# Patient Record
Sex: Male | Born: 1950
Health system: Southern US, Community
[De-identification: ages and names within clinical notes are randomized; demographics above are authoritative.]

## PROBLEM LIST (undated history)

## (undated) DIAGNOSIS — K5790 Diverticulosis of intestine, part unspecified, without perforation or abscess without bleeding: Secondary | ICD-10-CM

## (undated) DIAGNOSIS — R7303 Prediabetes: Secondary | ICD-10-CM

## (undated) DIAGNOSIS — Z973 Presence of spectacles and contact lenses: Secondary | ICD-10-CM

## (undated) DIAGNOSIS — E785 Hyperlipidemia, unspecified: Secondary | ICD-10-CM

## (undated) DIAGNOSIS — K635 Polyp of colon: Secondary | ICD-10-CM

## (undated) DIAGNOSIS — N189 Chronic kidney disease, unspecified: Secondary | ICD-10-CM

## (undated) DIAGNOSIS — K219 Gastro-esophageal reflux disease without esophagitis: Secondary | ICD-10-CM

## (undated) DIAGNOSIS — J189 Pneumonia, unspecified organism: Secondary | ICD-10-CM

## (undated) DIAGNOSIS — I2510 Atherosclerotic heart disease of native coronary artery without angina pectoris: Secondary | ICD-10-CM

## (undated) DIAGNOSIS — C4491 Basal cell carcinoma of skin, unspecified: Secondary | ICD-10-CM

## (undated) DIAGNOSIS — M1990 Unspecified osteoarthritis, unspecified site: Secondary | ICD-10-CM

## (undated) DIAGNOSIS — R0600 Dyspnea, unspecified: Secondary | ICD-10-CM

## (undated) DIAGNOSIS — U071 COVID-19: Secondary | ICD-10-CM

## (undated) DIAGNOSIS — R519 Headache, unspecified: Secondary | ICD-10-CM

## (undated) DIAGNOSIS — G4730 Sleep apnea, unspecified: Secondary | ICD-10-CM

## (undated) DIAGNOSIS — Z87442 Personal history of urinary calculi: Secondary | ICD-10-CM

## (undated) HISTORY — PX: TONSILLECTOMY: SUR1361

## (undated) HISTORY — DX: COVID-19: U07.1

## (undated) HISTORY — DX: Unspecified osteoarthritis, unspecified site: M19.90

## (undated) HISTORY — DX: Polyp of colon: K63.5

## (undated) HISTORY — DX: Basal cell carcinoma of skin, unspecified: C44.91

## (undated) HISTORY — PX: CARDIAC CATHETERIZATION: SHX172

## (undated) HISTORY — DX: Diverticulosis of intestine, part unspecified, without perforation or abscess without bleeding: K57.90

## (undated) HISTORY — PX: COLONOSCOPY: SHX174

## (undated) HISTORY — PX: APPENDECTOMY: SHX54

## (undated) HISTORY — PX: BASAL CELL CARCINOMA EXCISION: SHX1214

## (undated) HISTORY — PX: POLYPECTOMY: SHX149

## (undated) HISTORY — PX: KNEE ARTHROSCOPY: SUR90

## (undated) HISTORY — DX: Gastro-esophageal reflux disease without esophagitis: K21.9

## (undated) HISTORY — PX: HEMORRHOID BANDING: SHX5850

## (undated) HISTORY — PX: CATARACT EXTRACTION: SUR2

## (undated) HISTORY — DX: Hyperlipidemia, unspecified: E78.5

---

## 1999-03-13 ENCOUNTER — Ambulatory Visit (HOSPITAL_BASED_OUTPATIENT_CLINIC_OR_DEPARTMENT_OTHER): Admission: RE | Admit: 1999-03-13 | Discharge: 1999-03-13 | Payer: Self-pay | Admitting: *Deleted

## 1999-03-26 ENCOUNTER — Ambulatory Visit (HOSPITAL_BASED_OUTPATIENT_CLINIC_OR_DEPARTMENT_OTHER): Admission: RE | Admit: 1999-03-26 | Discharge: 1999-03-26 | Payer: Self-pay | Admitting: *Deleted

## 1999-07-31 ENCOUNTER — Ambulatory Visit (HOSPITAL_COMMUNITY): Admission: RE | Admit: 1999-07-31 | Discharge: 1999-07-31 | Payer: Self-pay | Admitting: Gastroenterology

## 1999-07-31 ENCOUNTER — Encounter: Payer: Self-pay | Admitting: Gastroenterology

## 1999-08-15 ENCOUNTER — Emergency Department (HOSPITAL_COMMUNITY): Admission: EM | Admit: 1999-08-15 | Discharge: 1999-08-15 | Payer: Self-pay | Admitting: *Deleted

## 1999-09-22 ENCOUNTER — Encounter (INDEPENDENT_AMBULATORY_CARE_PROVIDER_SITE_OTHER): Payer: Self-pay

## 1999-09-22 ENCOUNTER — Other Ambulatory Visit: Admission: RE | Admit: 1999-09-22 | Discharge: 1999-09-22 | Payer: Self-pay | Admitting: *Deleted

## 1999-12-07 ENCOUNTER — Emergency Department (HOSPITAL_COMMUNITY): Admission: EM | Admit: 1999-12-07 | Discharge: 1999-12-07 | Payer: Self-pay | Admitting: Emergency Medicine

## 1999-12-07 ENCOUNTER — Encounter: Payer: Self-pay | Admitting: *Deleted

## 2000-03-13 ENCOUNTER — Emergency Department (HOSPITAL_COMMUNITY): Admission: EM | Admit: 2000-03-13 | Discharge: 2000-03-13 | Payer: Self-pay

## 2000-03-13 ENCOUNTER — Encounter: Payer: Self-pay | Admitting: Emergency Medicine

## 2000-03-25 ENCOUNTER — Encounter: Payer: Self-pay | Admitting: Neurological Surgery

## 2000-03-25 ENCOUNTER — Ambulatory Visit (HOSPITAL_COMMUNITY): Admission: RE | Admit: 2000-03-25 | Discharge: 2000-03-25 | Payer: Self-pay | Admitting: Neurological Surgery

## 2000-08-02 ENCOUNTER — Ambulatory Visit (HOSPITAL_COMMUNITY): Admission: RE | Admit: 2000-08-02 | Discharge: 2000-08-02 | Payer: Self-pay | Admitting: Gastroenterology

## 2000-08-05 ENCOUNTER — Other Ambulatory Visit: Admission: RE | Admit: 2000-08-05 | Discharge: 2000-08-05 | Payer: Self-pay | Admitting: Gastroenterology

## 2000-08-05 ENCOUNTER — Encounter (INDEPENDENT_AMBULATORY_CARE_PROVIDER_SITE_OTHER): Payer: Self-pay | Admitting: Specialist

## 2001-03-29 ENCOUNTER — Encounter: Payer: Self-pay | Admitting: Neurological Surgery

## 2001-03-29 ENCOUNTER — Ambulatory Visit (HOSPITAL_COMMUNITY): Admission: RE | Admit: 2001-03-29 | Discharge: 2001-03-29 | Payer: Self-pay | Admitting: Neurological Surgery

## 2001-10-30 ENCOUNTER — Emergency Department (HOSPITAL_COMMUNITY): Admission: EM | Admit: 2001-10-30 | Discharge: 2001-10-30 | Payer: Self-pay | Admitting: Emergency Medicine

## 2001-10-30 ENCOUNTER — Encounter: Payer: Self-pay | Admitting: Emergency Medicine

## 2003-04-30 ENCOUNTER — Inpatient Hospital Stay (HOSPITAL_COMMUNITY): Admission: EM | Admit: 2003-04-30 | Discharge: 2003-05-02 | Payer: Self-pay | Admitting: Emergency Medicine

## 2003-05-15 ENCOUNTER — Encounter: Admission: RE | Admit: 2003-05-15 | Discharge: 2003-05-15 | Payer: Self-pay | Admitting: Family Medicine

## 2003-08-17 ENCOUNTER — Ambulatory Visit (HOSPITAL_BASED_OUTPATIENT_CLINIC_OR_DEPARTMENT_OTHER): Admission: RE | Admit: 2003-08-17 | Discharge: 2003-08-17 | Payer: Self-pay | Admitting: General Surgery

## 2003-08-17 ENCOUNTER — Encounter (INDEPENDENT_AMBULATORY_CARE_PROVIDER_SITE_OTHER): Payer: Self-pay | Admitting: Specialist

## 2003-08-17 ENCOUNTER — Ambulatory Visit (HOSPITAL_COMMUNITY): Admission: RE | Admit: 2003-08-17 | Discharge: 2003-08-17 | Payer: Self-pay | Admitting: General Surgery

## 2004-04-29 ENCOUNTER — Ambulatory Visit: Payer: Self-pay | Admitting: Gastroenterology

## 2004-05-09 ENCOUNTER — Ambulatory Visit: Payer: Self-pay | Admitting: Gastroenterology

## 2009-04-24 ENCOUNTER — Encounter (INDEPENDENT_AMBULATORY_CARE_PROVIDER_SITE_OTHER): Payer: Self-pay | Admitting: *Deleted

## 2009-05-27 ENCOUNTER — Encounter: Payer: Self-pay | Admitting: Gastroenterology

## 2009-06-11 ENCOUNTER — Encounter (INDEPENDENT_AMBULATORY_CARE_PROVIDER_SITE_OTHER): Payer: Self-pay

## 2009-06-14 ENCOUNTER — Ambulatory Visit: Payer: Self-pay | Admitting: Gastroenterology

## 2010-01-14 ENCOUNTER — Telehealth: Payer: Self-pay | Admitting: Gastroenterology

## 2010-02-04 ENCOUNTER — Encounter: Admission: RE | Admit: 2010-02-04 | Discharge: 2010-02-04 | Payer: Self-pay | Admitting: Chiropractic Medicine

## 2010-03-17 ENCOUNTER — Ambulatory Visit (HOSPITAL_BASED_OUTPATIENT_CLINIC_OR_DEPARTMENT_OTHER): Admission: RE | Admit: 2010-03-17 | Discharge: 2010-03-17 | Payer: Self-pay | Admitting: General Surgery

## 2010-06-01 ENCOUNTER — Observation Stay (HOSPITAL_COMMUNITY)
Admission: EM | Admit: 2010-06-01 | Discharge: 2010-06-02 | Payer: Self-pay | Source: Home / Self Care | Admitting: Emergency Medicine

## 2010-06-01 ENCOUNTER — Emergency Department (HOSPITAL_COMMUNITY)
Admission: EM | Admit: 2010-06-01 | Discharge: 2010-06-01 | Disposition: A | Discharge status: Other Acute Inpatient Hospital | Payer: Self-pay | Source: Home / Self Care | Attending: Emergency Medicine | Admitting: Emergency Medicine

## 2010-07-29 NOTE — Progress Notes (Signed)
Summary: Schedule Colonscopy  Phone Note Outgoing Call Call back at [facility] Phone [phone]   Call placed by: Harlow Mares CMA Duncan Dull),  January 14, 2010 11:50 AM Call placed to: Patient Summary of Call: per patients wife the patietn is not interested in having his colonoscopy done at this time due to the insurance. They will call back if they  insurance situation situation changes.  Initial call taken by: Harlow Mares CMA (AAMA),  January 14, 2010 11:50 AM

## 2010-09-08 LAB — DIFFERENTIAL
Basophils Absolute: 0 10*3/uL (ref 0.0–0.1)
Lymphocytes Relative: 26 % (ref 12–46)
Lymphs Abs: 2.2 10*3/uL (ref 0.7–4.0)
Neutro Abs: 5.4 10*3/uL (ref 1.7–7.7)

## 2010-09-08 LAB — URINALYSIS, ROUTINE W REFLEX MICROSCOPIC
Hgb urine dipstick: NEGATIVE
Nitrite: NEGATIVE
Protein, ur: NEGATIVE mg/dL
Urobilinogen, UA: 0.2 mg/dL (ref 0.0–1.0)

## 2010-09-08 LAB — POCT I-STAT, CHEM 8
BUN: 14 mg/dL (ref 6–23)
Chloride: 109 mEq/L (ref 96–112)
HCT: 45 % (ref 39.0–52.0)
Potassium: 4.2 mEq/L (ref 3.5–5.1)

## 2010-09-08 LAB — CK TOTAL AND CKMB (NOT AT ARMC)
CK, MB: 1.4 ng/mL (ref 0.3–4.0)
CK, MB: 1.5 ng/mL (ref 0.3–4.0)
Relative Index: INVALID (ref 0.0–2.5)
Relative Index: INVALID (ref 0.0–2.5)
Total CK: 69 U/L (ref 7–232)
Total CK: 76 U/L (ref 7–232)

## 2010-09-08 LAB — CBC
HCT: 43.1 % (ref 39.0–52.0)
Hemoglobin: 14.2 g/dL (ref 13.0–17.0)
MCHC: 32.9 g/dL (ref 30.0–36.0)
MCV: 84.7 fL (ref 78.0–100.0)
WBC: 8.4 10*3/uL (ref 4.0–10.5)

## 2010-09-08 LAB — TROPONIN I
Troponin I: 0.01 ng/mL (ref 0.00–0.06)
Troponin I: 0.02 ng/mL (ref 0.00–0.06)

## 2010-09-08 LAB — HEPATIC FUNCTION PANEL
Albumin: 3.6 g/dL (ref 3.5–5.2)
Indirect Bilirubin: 0.9 mg/dL (ref 0.3–0.9)
Total Protein: 6 g/dL (ref 6.0–8.3)

## 2010-09-08 LAB — LIPASE, BLOOD: Lipase: 27 U/L (ref 11–59)

## 2010-09-11 LAB — POCT HEMOGLOBIN-HEMACUE: Hemoglobin: 15.7 g/dL (ref 13.0–17.0)

## 2010-09-30 ENCOUNTER — Ambulatory Visit (HOSPITAL_COMMUNITY): Payer: BC Managed Care – PPO

## 2010-09-30 ENCOUNTER — Ambulatory Visit (INDEPENDENT_AMBULATORY_CARE_PROVIDER_SITE_OTHER): Payer: BC Managed Care – PPO

## 2010-09-30 ENCOUNTER — Inpatient Hospital Stay (INDEPENDENT_AMBULATORY_CARE_PROVIDER_SITE_OTHER)
Admission: RE | Admit: 2010-09-30 | Discharge: 2010-09-30 | Disposition: A | Discharge status: Home / Self Care | Payer: BC Managed Care – PPO | Source: Ambulatory Visit | Attending: Emergency Medicine | Admitting: Emergency Medicine

## 2010-09-30 DIAGNOSIS — IMO0002 Reserved for concepts with insufficient information to code with codable children: Secondary | ICD-10-CM

## 2010-09-30 DIAGNOSIS — S0003XA Contusion of scalp, initial encounter: Secondary | ICD-10-CM

## 2010-09-30 DIAGNOSIS — S5000XA Contusion of unspecified elbow, initial encounter: Secondary | ICD-10-CM

## 2010-09-30 DIAGNOSIS — S1093XA Contusion of unspecified part of neck, initial encounter: Secondary | ICD-10-CM

## 2010-09-30 DIAGNOSIS — S139XXA Sprain of joints and ligaments of unspecified parts of neck, initial encounter: Secondary | ICD-10-CM

## 2010-11-14 NOTE — Op Note (Signed)
NAME:  Anthony King, Anthony King                     ACCOUNT NO.:  [id number]   MEDICAL RECORD NO.:  [id number]                   PATIENT TYPE:  AMB   LOCATION:  DSC                                  FACILITY:  [facility]   PHYSICIAN:  Adolph Pollack, M.D.            DATE OF BIRTH:  11/04/1950   DATE OF PROCEDURE:  08/17/2003  DATE OF DISCHARGE:                                 OPERATIVE REPORT   PREOPERATIVE DIAGNOSES:  Soft tissue mass left upper arm lateral aspect.   POSTOPERATIVE DIAGNOSES:  Soft tissue mass left upper arm lateral aspect.   PROCEDURE:  Excision of soft tissue mass left upper arm lateral aspect.   SURGEON:  Adolph Pollack, M.D.   ANESTHESIA:  1% lidocaine plus 0.5% plain Marcaine.   INDICATIONS FOR PROCEDURE:  Mr. Fanning is a 60 year old male who began  having some pain in the left lateral arm and has noticed a soft tissue mass  there. He seems to think the pain emanates from that area. He certainly has  a palpable mass in the subcutaneous tissue.  I discussed with him excision  of the mass including the procedure risks (bleeding, infection, injury to  nerve).  I did tell him that certainly we could remove the mass but I could  not guarantee it would take away his pain. He understood this and agreed to  proceed.   TECHNIQUE:  He was placed on his right side. A mass was exposed and marked  with a marking pen in the lateral aspect of the left upper arm.  He was then  sterilely prepped and draped. Local anesthetic was infiltrated directly over  the mass and a longitudinal incision made directly over the mass through the  skin and subcutaneous tissue.  Using blunt and some sharp dissection, I was  able to enucleate a lipomatous appearing mass measuring 1.6 cm.   I sent the mass to pathology in formalin. Direct pressure was held for  hemostasis. The wound was then closed with a running 4-0 Monocryl  subcuticular stitch. Steri-Strips and a sterile dressing were  applied.   He tolerated the procedure well without any apparent complications and was  discharged from the minor procedure room in satisfactory condition.                                               Adolph Pollack, M.D.    Kari Baars  D:  08/17/2003  T:  08/18/2003  Job:  [id number]

## 2010-11-14 NOTE — Discharge Summary (Signed)
NAME:  Anthony King, Anthony King                     ACCOUNT NO.:  [id number]   MEDICAL RECORD NO.:  [id number]                   PATIENT TYPE:  INP   LOCATION:  6706                                 FACILITY:  [facility]   PHYSICIAN:  Leighton Roach McDiarmid, M.D.             DATE OF BIRTH:  Jul 19, 1950   DATE OF ADMISSION:  04/30/2003  DATE OF DISCHARGE:  05/02/2003                                 DISCHARGE SUMMARY   DISCHARGE DIAGNOSIS:  Bacterial gastroenteritis.   DISCHARGE MEDICATIONS:  1. Cipro 500 mg one p.o. b.i.d. x4 days.  2. Ibuprofen 200 mg two p.o. q.6h. p.r.n. pain and fever.  3. Tylenol 500 mg two p.o. q.4-6h. p.r.n. pain and fever.   DISPOSITION:  To home.   FOLLOW UP:  Follow up with [facility].  He is to call for an  appointment for Monday or Tuesday and follow up with them sooner if his  symptoms do not improve.   PROCEDURE:  Chest x-ray which showed no acute abnormality.   HISTORY OF PRESENT ILLNESS:  Please see the dictated History and Physical.   HOSPITAL COURSE:  Anthony King was seen in the emergency room and there was  some speculation that he may have meningitis, although by the time he  arrived at the emergency room, his meningismus had improved.  He did have a  slightly elevated white count at 12.1, so he was admitted for observation.  He had one presyncopal episode which also instigated his transfer to the  floor.  He had numerous labs collected of the CBC with white count as  mentioned 12.1, H&H 14.3 and 41.6, platelets 166.  He had a sedimentation  rate of 11.  Sodium was 140, potassium 3.8, chloride 110, bicarb 24, BUN 14,  creatinine 1.4 with glucose of 102.  Total bilirubin was 1.4.  This was  increased to 1.6 on the second day of admission.  Alk phos was 71, SGOT 22,  SGPT 21, total protein 5.8, albumin 3.1, calcium 8.2 with lipase of 24.  A  UA was high in specific gravity, small amount of bilirubin, 15 ketones and  30 protein.  We were concerned  about dehydration, so he was admitted for IV  fluids.  He began to feel better in terms of having presyncopal episodes the  second day of his admission and by the third day, this had improved  considerably.  He continued to have diarrhea throughout his hospitalization.  A stool culture and blood culture were collected both of which were pending  at the time of discharge.  They should be followed by his outpatient  physician.  Given the white cells in his stool noting that he had a CRP of  193.3 and noting the fever, we felt this was most likely to be a bacterial  gastroenteritis and Cipro therapy was initiated.  He was discharged with  this medication.  He was feeling a great deal  improved, although he still  continued with diarrhea the day of discharge.     Ursula Beath, MD                     Etta Grandchild, M.D.   JT/MEDQ  D:  05/02/2003  T:  05/03/2003  Job:  [id number]   cc:   [facility]

## 2010-11-14 NOTE — H&P (Signed)
NAME:  Anthony King, Anthony King                     ACCOUNT NO.:  [id number]   MEDICAL RECORD NO.:  [id number]                   PATIENT TYPE:  INP   LOCATION:  6706                                 FACILITY:  [facility]   PHYSICIAN:  Leighton Roach McDiarmid, M.D.             DATE OF BIRTH:  1951/06/25   DATE OF ADMISSION:  04/30/2003  DATE OF DISCHARGE:                                HISTORY & PHYSICAL   CHIEF COMPLAINT:  Headache, fever, and diarrhea.   HISTORY OF PRESENT ILLNESS:  Mr. Ferrin is an otherwise healthy 52-year-  old white male who [facility] fevers  since 2 a.m. this morning. His temperature was noted to be 102.6 at [facility]. This is accompanied by an occipital headache without  radiation and extreme dizziness. He had been taking Motrin and Tylenol  throughout the day, but the headache grew so intense that he needed to be  seen. No vision changes. No mental status changes. No loss of consciousness.  No presyncopal episodes until being seen in the emergency department when he  got out of bed to use the restroom and fell to his knees but did not loose  consciousness. He did note that after IV fluids in the emergency department  he felt that his vertigo was less intense and his headache was improved.   REVIEW OF SYSTEMS:  Diarrhea x2 episodes prior to being seen in the  emergency room and one in the emergency room, some left lower quadrant  abdominal cramping which was relieved with his bowel movement. No vomiting  but some nausea. No rash, no edema, no congestion or rhinorrhea. Of note, he  did go to the [employer] football game two days prior to being seen.   PAST MEDICAL HISTORY:  1. Gastroesophageal reflux disease.  2. History of a colonic polyp.  3. History of nephrolithiasis three years ago.  4. History of a choroid plexus cyst noted incidentally on a MRI in 2002     which was not changed from a year prior to that.   MEDICATIONS:  Prevacid 30 mg daily.   ALLERGIES:  No known drug allergies.   SOCIAL HISTORY:  He lives with his wife. He is a Naval architect. History  of tobacco use and quit in 1992. No alcohol or drugs.   FAMILY HISTORY:  His father died of a malignancy, and his mother is healthy.   OBJECTIVE:  VITAL SIGNS:  Temperature 100.6 then 101.1, blood pressure  121/75 then 100/56, heart rate 85 then 68, respirations 19, saturating 97%  on room air.  GENERAL:  Initially, he was alert and oriented, and after his presyncopal  episode, he appeared somewhat fatigued but was still appropriate.  HEENT:  Pupils are equal, round, and reactive to light. Extraocular  movements are intact. Tympanic membranes were clear bilaterally. His  oropharynx was clear.  NECK:  No lymphadenopathy. No thyromegaly. Neck was  supple with full range  of motion and no meningismus sign.  CARDIOVASCULAR:  Regular rate and rhythm with no murmurs, rubs, or gallops.  Two plus dorsalis pedis bilaterally.  LUNGS:  Clear to auscultation bilaterally with good air movement.  ABDOMEN:  Soft, nontender, nondistended, no hepatosplenomegaly.  EXTREMITIES:  No clubbing, cyanosis, or edema.  NEUROMUSCULAR:  Cranial nerves II-XII were intact. Strength was 5/5 in upper  and lower extremities. Hall-Pike maneuver elicited vertigo and one to two  beats of horizontal nystagmus which was latent in onset. Negative  Brudzinski's and Kernig's signs. Gait was normal, but the patient felt  imbalanced when ambulating.  SKIN:  No lesions.   LABORATORY DATA:  White blood cell count 11.2, hemoglobin 15.1, platelets  225,000; 83% segs, 10% lymphocytes. Urinalysis:  Specific gravity 1.039, 15  of ketones, negative leukocyte esterase, negative nitrite, negative blood.   ASSESSMENT/PLAN:  This is a 60 year old male with headaches, fever, vertigo,  and diarrhea.   PROBLEM #1.  Headache. The initial concern was the possibility of meningitis  with  his fever and reported stiff neck at [facility]. Currently,  his exam is not consistent with meningismus. There is concern that the  headache is somehow related to the cyst in his choroid plexus. Consider  rescanning if his symptoms do not improve to determine if the cyst has  changed. We will discuss this with team. It is possible that his headache is  related to his diarrhea. Please see below.  PROBLEM #2.  Fever. Since meningitis is less likely, consider bacterial  gastroenteritis or labyrinthitis as cause of his fever, although laryngitis  is less likely. Of note, his urinalysis is negative. His lung exam is within  normal limits. There is no rash, and he has no URI symptoms. Will not start  antibiotics without any definite source, and also it is likely that either a  gastroenteritis or labyrinthitis would be viral. Will check stool for white  blood cells and culture and treat if this shows sign of bacterial infection.  It is possible that if this is a bacterial gastroenteritis that his headache  is related to dysentery.  PROBLEM #3.  Vertigo. This may be secondary to dehydration given improvement  with IV fluids, and his urinalysis is consistent with dehydration. This may  also be labyrinthitis versus benign positional vertigo. Will watch for  further symptoms. Check orthostatics.  PROBLEM #4.  Fluids, electrolytes, and nutrition. Clear diet and advance as  tolerated. Will check electrolytes in the morning. Continue IV fluid  hydration.      Maurice March, M.D.                        Etta Grandchild, M.D.    LC/MEDQ  D:  05/01/2003  T:  05/01/2003  Job:  [id number]   cc:   Brett Canales A. Cleta Alberts, M.D.  [address]  [place]  [place] [address]  Fax: [phone]

## 2010-11-14 NOTE — Procedures (Signed)
[address]  Patient:    Anthony King, Anthony King                  MRN: [id number] Proc. Date: 08/05/00 Adm. Date:  [id number] Disc. Date: [id number] Attending:  Judeth Cornfield                           Procedure Report  PROCEDURE PERFORMED:  Esophageal manometry.  ENDOSCOPIST:  Barbette Hair. Arlyce Dice, M.D. [facility]  Esophageal manometry was performed in the usual fashion with pullthrough technique.  FINDINGS:  1. Lower esophageal sphincter resting pressure was 9.3 with 92% relaxation. 2. There were peristaltic contractions throughout the body of the    esophagus.  In the upper esophagus there were two nontransmitted    contractions. 3. Upper esophageal sphincter pressure and relaxation were normal.  IMPRESSION:  Nonspecific motor disorder of the esophagus. DD:  08/05/00 TD:  08/05/00 Job: [id number]

## 2011-07-02 ENCOUNTER — Ambulatory Visit (INDEPENDENT_AMBULATORY_CARE_PROVIDER_SITE_OTHER): Payer: BC Managed Care – PPO | Admitting: Family Medicine

## 2011-07-02 ENCOUNTER — Encounter: Payer: Self-pay | Admitting: Family Medicine

## 2011-07-02 VITALS — BP 116/74 | HR 79 | Temp 98.5°F | Ht 71.0 in | Wt 225.0 lb

## 2011-07-02 DIAGNOSIS — K469 Unspecified abdominal hernia without obstruction or gangrene: Secondary | ICD-10-CM

## 2011-07-02 DIAGNOSIS — E785 Hyperlipidemia, unspecified: Secondary | ICD-10-CM

## 2011-07-02 NOTE — Progress Notes (Signed)
  Subjective:    Patient ID: Anthony King, male    DOB: 11/22/1950, 61 y.o.   MRN: [id number]  HPI Pt is here to establish and to discuss a referral to a surgeon for hernia repair.  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  Pt states since then he notices it more and is becoming more uncomfortable.  Pain comes and goes.    Review of Systems    as above Objective:   Physical Exam  Constitutional: He appears well-developed and well-nourished.  Abdominal: Soft. Bowel sounds are normal. He exhibits no distension and no mass. There is no tenderness. There is no rebound and no guarding.       + small L inguinal hernia---reducible  Psychiatric: He has a normal mood and affect. His behavior is normal. Judgment and thought content normal.          Assessment & Plan:  Inguinal hernia---refer to surgury                             If pain returns and does not go away or if mass is felt and does not reduce--go to ER Hyperlipidemia---- recheck labs  fam hx colon cancer--- pt will schedule appointment with GI for colon

## 2011-07-02 NOTE — Patient Instructions (Signed)
Hernia °A hernia occurs when an internal organ pushes out through a weak spot in the abdominal wall. Hernias most commonly occur in the groin and around the navel. Hernias often can be pushed back into place (reduced). Most hernias tend to get worse over time. Some abdominal hernias can get stuck in the opening (irreducible or incarcerated hernia) and cannot be reduced. An irreducible abdominal hernia which is tightly squeezed into the opening is at risk for impaired blood supply (strangulated hernia). A strangulated hernia is a medical emergency. Because of the risk for an irreducible or strangulated hernia, surgery may be recommended to repair a hernia. °CAUSES  °· Heavy lifting.  °· Prolonged coughing.  °· Straining to have a bowel movement.  °· A cut (incision) made during an abdominal surgery.  °HOME CARE INSTRUCTIONS  °· Bed rest is not required. You may continue your normal activities.  °· Avoid lifting more than 10 pounds (4.5 kg) or straining.  °· Cough gently. If you are a smoker it is best to stop. Even the best hernia repair can break down with the continual strain of coughing. Even if you do not have your hernia repaired, a cough will continue to aggravate the problem.  °· Do not wear anything tight over your hernia. Do not try to keep it in with an outside bandage or truss. These can damage abdominal contents if they are trapped within the hernia sac.  °· Eat a normal diet.  °· Avoid constipation. Straining over long periods of time will increase hernia size and encourage breakdown of repairs. If you cannot do this with diet alone, stool softeners may be used.  °SEEK IMMEDIATE MEDICAL CARE IF:  °· You have a fever.  °· You develop increasing abdominal pain.  °· You feel nauseous or vomit.  °· Your hernia is stuck outside the abdomen, looks discolored, feels hard, or is tender.  °· You have any changes in your bowel habits or in the hernia that are unusual for you.  °· You have increased pain or  swelling around the hernia.  °· You cannot push the hernia back in place by applying gentle pressure while lying down.  °MAKE SURE YOU:  °· Understand these instructions.  °· Will watch your condition.  °· Will get help right away if you are not doing well or get worse.  °Document Released: 06/15/2005 Document Revised: 02/25/2011 Document Reviewed: 02/02/2008 °Exit[employer]® Patient Information ©2012 [employer]. °

## 2011-07-03 ENCOUNTER — Ambulatory Visit (INDEPENDENT_AMBULATORY_CARE_PROVIDER_SITE_OTHER): Payer: BC Managed Care – PPO | Admitting: General Surgery

## 2011-07-03 VITALS — BP 130/88 | HR 72 | Temp 97.4°F | Resp 14 | Ht 71.0 in | Wt 223.0 lb

## 2011-07-03 DIAGNOSIS — K4090 Unilateral inguinal hernia, without obstruction or gangrene, not specified as recurrent: Secondary | ICD-10-CM | POA: Insufficient documentation

## 2011-07-03 NOTE — Assessment & Plan Note (Addendum)
Plan laparoscopic inguinal hernia repair with mesh. Discussed open and lap repair.    I described the procedure in detail.  The patient was given educational material. We discussed the risks and benefits including but not limited to bleeding, infection, chronic inguinal pain, nerve entrapment, hernia recurrence, mesh complications, hematoma formation, urinary retention, numbness in the groin, blood clots, injury to the surrounding structures, and anesthesia risk. We also discussed the typical post operative recovery course, including no heavy lifting for 6 weeks.

## 2011-07-03 NOTE — Patient Instructions (Signed)
PATIENT INSTRUCTIONS  HERNIA  FOLLOW-UP:  Please make an appointment with your physician in 3 week(s).  Call your physician immediately if you have any fevers greater than 102.5, drainage from you wound that is not clear or looks infected, persistent bleeding, increasing abdominal pain, problems urinating, or persistent nausea/vomiting.    WOUND CARE INSTRUCTIONS:   Your wound will be covered with Dermabond surgical glue or Steristrips, gauze and tape.  If there are outer dressings in place such as gauze and tape, you should remove the outer dressing in 48 hours. You may not shower for 48 hours after surgery.   If clothing rubs against the wound or causes irritation and the wound is not draining you may cover it with a dry dressing during the daytime.  Try to keep the wound dry and avoid ointments on the wound unless directed to do so.  If the wound becomes bright red and painful or starts to drain infected material that is not clear, please contact your physician immediately.  If the wound is mildly pink and has a thick firm ridge underneath it, this is normal, and is referred to as a healing ridge.  This will resolve over the next 4-6 weeks. You may use ice packs or a heating pad on your incision, but place a shirt or towel between the ice pack/heating pad and your skin.  You may do this for 10-20 minutes every 4 hours.  You can expect to see bruising and swelling in the scrotum or labia if you have had an inguinal hernia repair.  If you have had a ventral/incisional/umbilical hernia repair, you may see some swelling where the hernia was.    DIET:  You may eat any foods that you can tolerate.  It is a good idea to eat a high fiber diet and take in plenty of fluids to prevent constipation.  If you do become constipated you may want to take a mild laxative or take Ducolax tablets on a daily basis until your bowel habits are regular.  Constipation can be very uncomfortable, along with straining, after recent  abdominal surgery.  ACTIVITY:  You are encouraged to cough and deep breath or use your incentive spirometer if you were given one, every 15-30 minutes when awake.  This will help prevent respiratory complications and low grade fevers post-operatively.  You may want to hug a pillow when coughing and sneezing to add additional support to the surgical area which will decrease pain during these times.  You are encouraged to walk and engage in light activity for the next two weeks.  You should not lift more than 10 pounds for 6 weeks as it could put you at increased risk for a hernia recurrence.  Twenty pounds is roughly equivalent to a plastic bag of groceries.    MEDICATIONS:  Try to take narcotic medications and anti-inflammatory medications, such as ibuprofen, naprosyn, etc., with food.  This will minimize stomach upset from the medication.  You may take Tylenol (acetominophen) if you are not taking your prescription medicine.  The prescription medicine has tylenol in it, and if you take both, you may overdose on tylenol which can cause liver failure.   Should you develop nausea and vomiting from the pain medication, or develop a rash, please discontinue the medication and contact your physician.  You should not drive, make important decisions, or operate machinery when taking narcotic pain medication.  QUESTIONS:  Please feel free to call our office [phone] if you have  any questions, and we will be glad to assist you.  Please give all insurance/disability forms to the front desk at our office.

## 2011-07-06 ENCOUNTER — Other Ambulatory Visit (INDEPENDENT_AMBULATORY_CARE_PROVIDER_SITE_OTHER): Payer: BC Managed Care – PPO

## 2011-07-06 ENCOUNTER — Other Ambulatory Visit: Payer: Self-pay | Admitting: Family Medicine

## 2011-07-06 DIAGNOSIS — E785 Hyperlipidemia, unspecified: Secondary | ICD-10-CM

## 2011-07-06 LAB — LIPID PANEL
HDL: 37.7 mg/dL — ABNORMAL LOW (ref >39.00)
Total CHOL/HDL Ratio: 7
Triglycerides: 162 mg/dL — ABNORMAL HIGH (ref 0.0–149.0)
VLDL: 32.4 mg/dL (ref 0.0–40.0)

## 2011-07-06 LAB — BASIC METABOLIC PANEL
CO2: 28 mEq/L (ref 19–32)
Chloride: 107 mEq/L (ref 96–112)
Glucose, Bld: 101 mg/dL — ABNORMAL HIGH (ref 70–99)
Potassium: 4.4 mEq/L (ref 3.5–5.1)
Sodium: 141 mEq/L (ref 135–145)

## 2011-07-06 NOTE — Progress Notes (Signed)
Chief Complaint  Patient presents with  . Other    Left inguinal hernia    HISTORY: Pt presents with 3 weeks of L groin pain.  He describes this as stinging and burning.  He feels a bulge in the left groin.  The pain is worse when he is on his feet and doing any lifting.  It has gradually been getting worse.  He denies relief with analgesics.  He does feel better when he rests.  He is also still having some soreness at the site below his right pec muscle where I removed a lipoma last year.  He denies obstructive symptoms, but does have some constipation.  He otherwise has no complaints.    Past Medical History  Diagnosis Date  . Arthritis   . Hyperlipidemia   . Ulcer   . GERD (gastroesophageal reflux disease)     Past Surgical History  Procedure Date  . Appendectomy     Current Outpatient Prescriptions  Medication Sig Dispense Refill  . Omega-3 Fatty Acids (FISH OIL) 1200 MG CAPS Take 1 capsule by mouth at bedtime.        Marland Kitchen omeprazole (PRILOSEC OTC) 20 MG tablet Take 20 mg by mouth daily.           Allergies  Allergen Reactions  . Aspirin     REACTION: nauseated, drooling     Family History  Problem Relation Age of Onset  . Stomach cancer Father   . Stomach cancer Sister     Passed away 12/12  . Cancer Sister     colon cancer  . Arthritis Mother   . Breast cancer Mother   . Arthritis Paternal Grandmother   . Arthritis Maternal Grandmother      History   Social History  . Marital Status: Married    Spouse Name: N/A    Number of Children: N/A  . Years of Education: N/A   Social History Main Topics  . Smoking status: Former Smoker    Quit date: 07/02/1991  . Smokeless tobacco: Never Used  . Alcohol Use: No  . Drug Use: No  . Sexually Active: Not on file    REVIEW OF SYSTEMS - PERTINENT POSITIVES ONLY: 12 point review of systems negative other than HPI and PMH.  EXAM: Filed Vitals:   07/03/11 1153  BP: 130/88  Pulse: 72  Temp: 97.4 F (36.3 C)    Resp: 14    Gen:  No acute distress.  Well nourished and well groomed.   Neurological: Alert and oriented to person, place, and time. Coordination normal.  Head: Normocephalic and atraumatic.  Eyes: Conjunctivae are normal. Pupils are equal, round, and reactive to light. No scleral icterus.  Neck: Normal range of motion. Neck supple. No tracheal deviation or thyromegaly present.  Cardiovascular: Normal rate, regular rhythm, normal heart sounds and intact distal pulses.  Exam reveals no gallop and no friction rub.  No murmur heard. Respiratory: Effort normal.  No respiratory distress. No chest wall tenderness. Breath sounds normal.  No wheezes, rales or rhonchi.  GI: Soft. Bowel sounds are normal. The abdomen is soft and nontender.  There is no rebound and no guarding.  Musculoskeletal: Normal range of motion. Extremities are nontender.  Groin:  Left inguinal hernia present.  Normal external male genitalia.  No sign of right inguinal hernia. Lymphadenopathy: No cervical, preauricular, postauricular or axillary adenopathy is present Skin: Skin is warm and dry. No rash noted. No diaphoresis. No erythema. No pallor. No clubbing,  cyanosis, or edema.   Psychiatric: Normal mood and affect. Behavior is normal. Judgment and thought content normal.    LABORATORY RESULTS: Elevated cholesterol, Tbili 1.4, otherwise essentially normal.     RADIOLOGY RESULTS: No recent imaging.    ASSESSMENT AND PLAN: Left inguinal hernia Plan laparoscopic inguinal hernia repair with mesh. Discussed open and lap repair.    I described the procedure in detail.  The patient was given educational material. We discussed the risks and benefits including but not limited to bleeding, infection, chronic inguinal pain, nerve entrapment, hernia recurrence, mesh complications, hematoma formation, urinary retention, numbness in the groin, blood clots, injury to the surrounding structures, and anesthesia risk. We also  discussed the typical post operative recovery course, including no heavy lifting for 6 weeks.          Maudry Diego MD Surgical Oncology, General and Endocrine Surgery [facility]      Visit Diagnoses: 1. Left inguinal hernia     Primary Care Physician: Loreen Freud, DO, DO

## 2011-07-07 ENCOUNTER — Other Ambulatory Visit (INDEPENDENT_AMBULATORY_CARE_PROVIDER_SITE_OTHER): Payer: Self-pay | Admitting: General Surgery

## 2011-07-07 LAB — HEPATIC FUNCTION PANEL
AST: 25 U/L (ref 0–37)
Total Bilirubin: 1.4 mg/dL — ABNORMAL HIGH (ref 0.3–1.2)

## 2011-07-07 MED ORDER — ATORVASTATIN CALCIUM 20 MG PO TABS: 20.0000 mg | ORAL_TABLET | Freq: Every day | ORAL | Status: DC

## 2011-07-09 ENCOUNTER — Encounter (HOSPITAL_COMMUNITY): Payer: Self-pay

## 2011-07-17 ENCOUNTER — Encounter (HOSPITAL_COMMUNITY): Payer: Self-pay

## 2011-07-17 ENCOUNTER — Encounter (HOSPITAL_COMMUNITY)
Admission: RE | Admit: 2011-07-17 | Discharge: 2011-07-17 | Disposition: A | Discharge status: Home / Self Care | Payer: BC Managed Care – PPO | Source: Ambulatory Visit | Attending: General Surgery | Admitting: General Surgery

## 2011-07-17 DIAGNOSIS — M1990 Unspecified osteoarthritis, unspecified site: Secondary | ICD-10-CM

## 2011-07-17 DIAGNOSIS — N189 Chronic kidney disease, unspecified: Secondary | ICD-10-CM

## 2011-07-17 HISTORY — DX: Unspecified osteoarthritis, unspecified site: M19.90

## 2011-07-17 HISTORY — DX: Chronic kidney disease, unspecified: N18.9

## 2011-07-17 HISTORY — PX: OTHER SURGICAL HISTORY: SHX169

## 2011-07-17 LAB — URINALYSIS, ROUTINE W REFLEX MICROSCOPIC
Ketones, ur: NEGATIVE mg/dL
Leukocytes, UA: NEGATIVE
Nitrite: NEGATIVE
Protein, ur: NEGATIVE mg/dL
Urobilinogen, UA: 0.2 mg/dL (ref 0.0–1.0)
pH: 6.5 (ref 5.0–8.0)

## 2011-07-17 LAB — SURGICAL PCR SCREEN: Staphylococcus aureus: POSITIVE — AB

## 2011-07-17 NOTE — Patient Instructions (Addendum)
[address] NEEDHAM BIGGINS  07/17/2011   Your procedure is scheduled on:  07-23-11  Report to [facility] at 1:15 PM.  Call this number if you have problems the morning of surgery: [phone]   Remember:   Do not eat food:After Midnight.  May have clear liquids:up to 6 Hours before arrival. Nothing after 0900 AM.  Clear liquids include soda, tea, black coffee, apple or grape juice, broth.  Take these medicines the morning of surgery with A SIP OF WATER: Lipitor, Omeprazole   Do not wear jewelry, make-up or nail polish.  Do not wear lotions, powders, or perfumes. You may wear deodorant.  Do not shave 48 hours prior to surgery.  Do not bring valuables to the hospital.  Contacts, dentures or bridgework may not be worn into surgery.  Leave suitcase in the car. After surgery it may be brought to your room.  For patients admitted to the hospital, checkout time is 11:00 AM the day of discharge.   Patients discharged the day of surgery will not be allowed to drive home.  Name and phone number of your driver: spouse  Special Instructions: CHG Shower Use Special Wash: 1/2 bottle night before surgery and 1/2 bottle morning of surgery.   Please read over the following fact sheets that you were given: MRSA Information

## 2011-07-17 NOTE — Pre-Procedure Instructions (Signed)
07-17-11 EKG( 03-27-11), Stress test report 06-02-10 with chart.

## 2011-07-23 ENCOUNTER — Ambulatory Visit (HOSPITAL_COMMUNITY)
Admission: RE | Admit: 2011-07-23 | Discharge: 2011-07-23 | Disposition: A | Discharge status: Home / Self Care | Payer: BC Managed Care – PPO | Source: Ambulatory Visit | Attending: General Surgery | Admitting: General Surgery

## 2011-07-23 ENCOUNTER — Ambulatory Visit (HOSPITAL_COMMUNITY): Payer: BC Managed Care – PPO | Admitting: Anesthesiology

## 2011-07-23 ENCOUNTER — Encounter (HOSPITAL_COMMUNITY): Payer: Self-pay | Admitting: *Deleted

## 2011-07-23 ENCOUNTER — Encounter (HOSPITAL_COMMUNITY): Payer: Self-pay | Admitting: Anesthesiology

## 2011-07-23 ENCOUNTER — Encounter (HOSPITAL_COMMUNITY)
Admission: RE | Disposition: A | Discharge status: Home / Self Care | Payer: Self-pay | Source: Ambulatory Visit | Attending: General Surgery

## 2011-07-23 DIAGNOSIS — K4090 Unilateral inguinal hernia, without obstruction or gangrene, not specified as recurrent: Secondary | ICD-10-CM | POA: Insufficient documentation

## 2011-07-23 DIAGNOSIS — K219 Gastro-esophageal reflux disease without esophagitis: Secondary | ICD-10-CM | POA: Insufficient documentation

## 2011-07-23 DIAGNOSIS — E785 Hyperlipidemia, unspecified: Secondary | ICD-10-CM | POA: Insufficient documentation

## 2011-07-23 DIAGNOSIS — Z01812 Encounter for preprocedural laboratory examination: Secondary | ICD-10-CM | POA: Insufficient documentation

## 2011-07-23 DIAGNOSIS — Z79899 Other long term (current) drug therapy: Secondary | ICD-10-CM | POA: Insufficient documentation

## 2011-07-23 HISTORY — PX: INGUINAL HERNIA REPAIR: SHX194

## 2011-07-23 SURGERY — REPAIR, HERNIA, INGUINAL, LAPAROSCOPIC
Anesthesia: General | Site: Groin | Laterality: Left | Wound class: Clean Contaminated

## 2011-07-23 MED ORDER — STERILE WATER FOR IRRIGATION IR SOLN
Status: DC | PRN
  Administered 2011-07-23: 1500 mL

## 2011-07-23 MED ORDER — SODIUM CHLORIDE 0.9 % IV SOLN: 250.0000 mL | INTRAVENOUS | Status: DC | PRN

## 2011-07-23 MED ORDER — FENTANYL CITRATE 0.05 MG/ML IJ SOLN
25.0000 ug | INTRAMUSCULAR | Status: DC | PRN
  Administered 2011-07-23 (×2): 50 ug via INTRAVENOUS

## 2011-07-23 MED ORDER — FENTANYL CITRATE 0.05 MG/ML IJ SOLN
INTRAMUSCULAR | Status: AC
  Filled 2011-07-23: qty 2

## 2011-07-23 MED ORDER — 0.9 % SODIUM CHLORIDE (POUR BTL) OPTIME
TOPICAL | Status: DC | PRN
  Administered 2011-07-23: 1000 mL

## 2011-07-23 MED ORDER — ONDANSETRON HCL 4 MG/2ML IJ SOLN
INTRAMUSCULAR | Status: DC | PRN
  Administered 2011-07-23: 4 mg via INTRAVENOUS

## 2011-07-23 MED ORDER — LACTATED RINGERS IV SOLN
INTRAVENOUS | Status: DC | PRN
  Administered 2011-07-23 (×2): via INTRAVENOUS

## 2011-07-23 MED ORDER — BUPIVACAINE LIPOSOME 1.3 % IJ SUSP
20.0000 mL | INTRAMUSCULAR | Status: DC
  Filled 2011-07-23: qty 20

## 2011-07-23 MED ORDER — FENTANYL CITRATE 0.05 MG/ML IJ SOLN
INTRAMUSCULAR | Status: DC | PRN
  Administered 2011-07-23: 100 ug via INTRAVENOUS
  Administered 2011-07-23: 50 ug via INTRAVENOUS

## 2011-07-23 MED ORDER — OXYCODONE-ACETAMINOPHEN 5-325 MG PO TABS
ORAL_TABLET | ORAL | Status: AC
  Administered 2011-07-23: 1 via ORAL
  Filled 2011-07-23: qty 1

## 2011-07-23 MED ORDER — BUPIVACAINE LIPOSOME 1.3 % IJ SUSP
INTRAMUSCULAR | Status: DC | PRN
  Administered 2011-07-23: 20 mL

## 2011-07-23 MED ORDER — ACETAMINOPHEN 10 MG/ML IV SOLN
INTRAVENOUS | Status: AC
  Filled 2011-07-23: qty 100

## 2011-07-23 MED ORDER — OXYCODONE HCL 5 MG PO TABS: 5.0000 mg | ORAL_TABLET | ORAL | Status: DC | PRN

## 2011-07-23 MED ORDER — MIDAZOLAM HCL 5 MG/5ML IJ SOLN
INTRAMUSCULAR | Status: DC | PRN
  Administered 2011-07-23: 2 mg via INTRAVENOUS

## 2011-07-23 MED ORDER — ACETAMINOPHEN 325 MG PO TABS: 650.0000 mg | ORAL_TABLET | ORAL | Status: DC | PRN

## 2011-07-23 MED ORDER — SODIUM CHLORIDE 0.9 % IJ SOLN: 3.0000 mL | Freq: Two times a day (BID) | INTRAMUSCULAR | Status: DC

## 2011-07-23 MED ORDER — OXYCODONE-ACETAMINOPHEN 5-325 MG PO TABS: 1.0000 | ORAL_TABLET | ORAL | Status: AC | PRN

## 2011-07-23 MED ORDER — PROMETHAZINE HCL 25 MG/ML IJ SOLN: 12.5000 mg | Freq: Four times a day (QID) | INTRAMUSCULAR | Status: DC | PRN

## 2011-07-23 MED ORDER — PROPOFOL 10 MG/ML IV EMUL
INTRAVENOUS | Status: DC | PRN
  Administered 2011-07-23: 200 mg via INTRAVENOUS

## 2011-07-23 MED ORDER — ACETAMINOPHEN 10 MG/ML IV SOLN
INTRAVENOUS | Status: DC | PRN
  Administered 2011-07-23: 1000 mg via INTRAVENOUS

## 2011-07-23 MED ORDER — CEFAZOLIN SODIUM-DEXTROSE 2-3 GM-% IV SOLR
INTRAVENOUS | Status: AC
  Filled 2011-07-23: qty 50

## 2011-07-23 MED ORDER — PROMETHAZINE HCL 25 MG/ML IJ SOLN
6.2500 mg | INTRAMUSCULAR | Status: DC | PRN
Start: 2011-07-23 — End: 2011-07-24

## 2011-07-23 MED ORDER — NEOSTIGMINE METHYLSULFATE 1 MG/ML IJ SOLN
INTRAMUSCULAR | Status: DC | PRN
  Administered 2011-07-23: 5 mg via INTRAVENOUS

## 2011-07-23 MED ORDER — GLYCOPYRROLATE 0.2 MG/ML IJ SOLN
INTRAMUSCULAR | Status: DC | PRN
  Administered 2011-07-23: 0.2 mg via INTRAVENOUS
  Administered 2011-07-23: .6 mg via INTRAVENOUS

## 2011-07-23 MED ORDER — SODIUM CHLORIDE 0.9 % IJ SOLN: 3.0000 mL | INTRAMUSCULAR | Status: DC | PRN

## 2011-07-23 MED ORDER — CEFAZOLIN SODIUM-DEXTROSE 2-3 GM-% IV SOLR
2.0000 g | INTRAVENOUS | Status: AC
  Administered 2011-07-23: 2 g via INTRAVENOUS

## 2011-07-23 MED ORDER — ROCURONIUM BROMIDE 100 MG/10ML IV SOLN
INTRAVENOUS | Status: DC | PRN
  Administered 2011-07-23: 50 mg via INTRAVENOUS

## 2011-07-23 MED ORDER — ACETAMINOPHEN 650 MG RE SUPP
650.0000 mg | RECTAL | Status: DC | PRN
  Filled 2011-07-23: qty 1

## 2011-07-23 MED ORDER — LIDOCAINE HCL (CARDIAC) 20 MG/ML IV SOLN
INTRAVENOUS | Status: DC | PRN
  Administered 2011-07-23: 80 mg via INTRAVENOUS

## 2011-07-23 MED ORDER — LACTATED RINGERS IV SOLN: INTRAVENOUS | Status: DC

## 2011-07-23 MED ORDER — ONDANSETRON HCL 4 MG/2ML IJ SOLN: 4.0000 mg | Freq: Four times a day (QID) | INTRAMUSCULAR | Status: DC | PRN

## 2011-07-23 SURGICAL SUPPLY — 38 items
ADH SKN CLS APL DERMABOND .7 (GAUZE/BANDAGES/DRESSINGS) ×1
CANISTER SUCTION 2500CC (MISCELLANEOUS) ×1 IMPLANT
CHLORAPREP W/TINT 26ML (MISCELLANEOUS) ×2 IMPLANT
CLOTH BEACON ORANGE TIMEOUT ST (SAFETY) ×2 IMPLANT
DECANTER SPIKE VIAL GLASS SM (MISCELLANEOUS) ×1 IMPLANT
DERMABOND ADVANCED (GAUZE/BANDAGES/DRESSINGS) ×1
DERMABOND ADVANCED .7 DNX12 (GAUZE/BANDAGES/DRESSINGS) IMPLANT
DEVICE SECURE STRAP 25 ABSORB (INSTRUMENTS) ×1 IMPLANT
DISSECT BALLN SPACEMKR + OVL (BALLOONS)
DISSECTOR BALLN SPACEMKR + OVL (BALLOONS) ×1 IMPLANT
DRAPE LAPAROSCOPIC ABDOMINAL (DRAPES) ×2 IMPLANT
DRAPE WARM FLUID 44X44 (DRAPE) ×2 IMPLANT
ELECT REM PT RETURN 9FT ADLT (ELECTROSURGICAL) ×2
ELECTRODE REM PT RTRN 9FT ADLT (ELECTROSURGICAL) ×1 IMPLANT
GLOVE BIO SURGEON STRL SZ 6 (GLOVE) ×4 IMPLANT
GLOVE BIOGEL PI IND STRL 6.5 (GLOVE) ×1 IMPLANT
GLOVE BIOGEL PI IND STRL 7.0 (GLOVE) ×1 IMPLANT
GLOVE BIOGEL PI INDICATOR 6.5 (GLOVE) ×3
GLOVE BIOGEL PI INDICATOR 7.0 (GLOVE) ×1
GLOVE INDICATOR 6.5 STRL GRN (GLOVE) ×4 IMPLANT
GLOVE SURG SS PI 7.0 STRL IVOR (GLOVE) ×1 IMPLANT
GOWN BRE IMP SLV AUR XL STRL (GOWN DISPOSABLE) ×1 IMPLANT
GOWN PREVENTION PLUS XXLARGE (GOWN DISPOSABLE) ×2 IMPLANT
GOWN STRL NON-REIN LRG LVL3 (GOWN DISPOSABLE) ×2 IMPLANT
KIT BASIN OR (CUSTOM PROCEDURE TRAY) ×2 IMPLANT
MESH 3DMAX 4X6 LT LRG (Mesh General) ×1 IMPLANT
NS IRRIG 1000ML POUR BTL (IV SOLUTION) ×1 IMPLANT
SET IRRIG TUBING LAPAROSCOPIC (IRRIGATION / IRRIGATOR) IMPLANT
STRIP CLOSURE SKIN 1/2X4 (GAUZE/BANDAGES/DRESSINGS) ×2 IMPLANT
SUT MNCRL AB 4-0 PS2 18 (SUTURE) ×2 IMPLANT
TACKER 5MM HERNIA 3.5CML NAB (ENDOMECHANICALS) IMPLANT
TOWEL OR 17X26 10 PK STRL BLUE (TOWEL DISPOSABLE) ×2 IMPLANT
TRAY FOLEY CATH 14FRSI W/METER (CATHETERS) ×2 IMPLANT
TRAY LAP CHOLE (CUSTOM PROCEDURE TRAY) ×2 IMPLANT
TROCAR CANNULA W/PORT DUAL 5MM (MISCELLANEOUS) ×4 IMPLANT
TUBING INSUFFLATION 10FT LAP (TUBING) ×2 IMPLANT
WARMER LAPAROSCOPE (MISCELLANEOUS) IMPLANT
WATER STERILE IRR 1500ML POUR (IV SOLUTION) ×1 IMPLANT

## 2011-07-23 NOTE — Interval H&P Note (Signed)
History and Physical Interval Note:  07/23/2011 3:42 PM  Anthony King  has presented today for surgery, with the diagnosis of ingunial hernia  The various methods of treatment have been discussed with the patient and family. After consideration of risks, benefits and other options for treatment, the patient has consented to  Procedure(s): LAPAROSCOPIC INGUINAL HERNIA as a surgical intervention .  The patients' history has been reviewed, patient examined, no change in status, stable for surgery.  I have reviewed the patients' chart and labs.  Questions were answered to the patient's satisfaction.     Jenissa Tyrell

## 2011-07-23 NOTE — Transfer of Care (Signed)
Immediate Anesthesia Transfer of Care Note  Patient: Anthony King  Procedure(s) Performed:  LAPAROSCOPIC INGUINAL HERNIA  Patient Location: PACU  Anesthesia Type: General  Level of Consciousness: sedated, patient cooperative and responds to stimulation  Airway & Oxygen Therapy: Patient Spontanous Breathing and Patient connected to face mask oxygen  Post-op Assessment: Report given to PACU RN and Post -op Vital signs reviewed and stable  Post vital signs: Reviewed and stable  Complications: No apparent anesthesia complications

## 2011-07-23 NOTE — Anesthesia Preprocedure Evaluation (Addendum)
Anesthesia Evaluation  Patient identified by MRN, date of birth, ID band Patient awake    Reviewed: Allergy & Precautions, H&P , NPO status , Patient's Chart, lab work & pertinent test results  Airway Mallampati: II TM Distance: >3 FB Neck ROM: full    Dental No notable dental hx. (+) Teeth Intact, Caps and Dental Advidsory Given Bridge upper incisors with multiple caps:   Pulmonary neg pulmonary ROS,  clear to auscultation  Pulmonary exam normal       Cardiovascular Exercise Tolerance: Good neg cardio ROS regular Normal    Neuro/Psych Negative Neurological ROS  Negative Psych ROS   GI/Hepatic negative GI ROS, Neg liver ROS, GERD-  Medicated and Controlled,  Endo/Other  Negative Endocrine ROS  Renal/GU negative Renal ROS  Genitourinary negative   Musculoskeletal   Abdominal   Peds  Hematology negative hematology ROS (+)   Anesthesia Other Findings   Reproductive/Obstetrics negative OB ROS                           Anesthesia Physical Anesthesia Plan  ASA: I  Anesthesia Plan: General   Post-op Pain Management:    Induction:   Airway Management Planned:   Additional Equipment:   Intra-op Plan:   Post-operative Plan:   Informed Consent: I have reviewed the patients History and Physical, chart, labs and discussed the procedure including the risks, benefits and alternatives for the proposed anesthesia with the patient or authorized representative who has indicated his/her understanding and acceptance.   Dental Advisory Given  Plan Discussed with: CRNA  Anesthesia Plan Comments:         Anesthesia Quick Evaluation

## 2011-07-23 NOTE — Preoperative (Signed)
Beta Blockers   Reason not to administer Beta Blockers:Not Applicable 

## 2011-07-23 NOTE — H&P (View-Only) (Signed)
Chief Complaint  Patient presents with  . Other    Left inguinal hernia    HISTORY: Pt presents with 3 weeks of L groin pain.  He describes this as stinging and burning.  He feels a bulge in the left groin.  The pain is worse when he is on his feet and doing any lifting.  It has gradually been getting worse.  He denies relief with analgesics.  He does feel better when he rests.  He is also still having some soreness at the site below his right pec muscle where I removed a lipoma last year.  He denies obstructive symptoms, but does have some constipation.  He otherwise has no complaints.    Past Medical History  Diagnosis Date  . Arthritis   . Hyperlipidemia   . Ulcer   . GERD (gastroesophageal reflux disease)     Past Surgical History  Procedure Date  . Appendectomy     Current Outpatient Prescriptions  Medication Sig Dispense Refill  . Omega-3 Fatty Acids (FISH OIL) 1200 MG CAPS Take 1 capsule by mouth at bedtime.        . omeprazole (PRILOSEC OTC) 20 MG tablet Take 20 mg by mouth daily.           Allergies  Allergen Reactions  . Aspirin     REACTION: nauseated, drooling     Family History  Problem Relation Age of Onset  . Stomach cancer Father   . Stomach cancer Sister     Passed away 12/12  . Cancer Sister     colon cancer  . Arthritis Mother   . Breast cancer Mother   . Arthritis Paternal Grandmother   . Arthritis Maternal Grandmother      History   Social History  . Marital Status: Married    Spouse Name: N/A    Number of Children: N/A  . Years of Education: N/A   Social History Main Topics  . Smoking status: Former Smoker    Quit date: 07/02/1991  . Smokeless tobacco: Never Used  . Alcohol Use: No  . Drug Use: No  . Sexually Active: Not on file    REVIEW OF SYSTEMS - PERTINENT POSITIVES ONLY: 12 point review of systems negative other than HPI and PMH.  EXAM: Filed Vitals:   07/03/11 1153  BP: 130/88  Pulse: 72  Temp: 97.4 F (36.3 C)    Resp: 14    Gen:  No acute distress.  Well nourished and well groomed.   Neurological: Alert and oriented to person, place, and time. Coordination normal.  Head: Normocephalic and atraumatic.  Eyes: Conjunctivae are normal. Pupils are equal, round, and reactive to light. No scleral icterus.  Neck: Normal range of motion. Neck supple. No tracheal deviation or thyromegaly present.  Cardiovascular: Normal rate, regular rhythm, normal heart sounds and intact distal pulses.  Exam reveals no gallop and no friction rub.  No murmur heard. Respiratory: Effort normal.  No respiratory distress. No chest wall tenderness. Breath sounds normal.  No wheezes, rales or rhonchi.  GI: Soft. Bowel sounds are normal. The abdomen is soft and nontender.  There is no rebound and no guarding.  Musculoskeletal: Normal range of motion. Extremities are nontender.  Groin:  Left inguinal hernia present.  Normal external male genitalia.  No sign of right inguinal hernia. Lymphadenopathy: No cervical, preauricular, postauricular or axillary adenopathy is present Skin: Skin is warm and dry. No rash noted. No diaphoresis. No erythema. No pallor. No clubbing,   cyanosis, or edema.   Psychiatric: Normal mood and affect. Behavior is normal. Judgment and thought content normal.    LABORATORY RESULTS: Elevated cholesterol, Tbili 1.4, otherwise essentially normal.     RADIOLOGY RESULTS: No recent imaging.    ASSESSMENT AND PLAN: Left inguinal hernia Plan laparoscopic inguinal hernia repair with mesh. Discussed open and lap repair.    I described the procedure in detail.  The patient was given educational material. We discussed the risks and benefits including but not limited to bleeding, infection, chronic inguinal pain, nerve entrapment, hernia recurrence, mesh complications, hematoma formation, urinary retention, numbness in the groin, blood clots, injury to the surrounding structures, and anesthesia risk. We also  discussed the typical post operative recovery course, including no heavy lifting for 6 weeks.          Kaynan Klonowski L Arn Mcomber MD Surgical Oncology, General and Endocrine Surgery [facility]      Visit Diagnoses: 1. Left inguinal hernia     Primary Care Physician: Yvonne Lowne, DO, DO    

## 2011-07-23 NOTE — Op Note (Signed)
OPERATIVE REPORT   PREOPERATIVE DIAGNOSIS:  Left inguinal hernia  POSTOPERATIVE DIAGNOSIS: Left inguinal hernia   PROCEDURE: Laparoscopic repair of Left inguinal hernia with  large 3D Max mesh  SURGEON: Almond Lint, MD   ANESTHESIA: General and Exparel  FINDINGS: Left direct and indirect inguinal hernia   SPECIMENS: None.   ESTIMATED BLOOD LOSS: min  COMPLICATIONS: None known.   PROCEDURE: Patient was identified in the holding area and taken to  the operating room where he was placed supine on the operating table.  General anesthesia was induced. His arms were tucked and his abdomen  was clipped, prepped and draped in the sterile fashion. Time-out was  performed according to the surgical safety check list. When all was  correct we continued.  Local anesthetic was injected into the infraumbilical  space and a curvilinear transverse incision was made with a #11 blade.  The subcutaneous tissues were spreaded with a Tresa Endo and S retractors  were used to assist with visualization and the anterior rectus sheath  was incised with a #11 blade. The rectus abdominis muscle was retracted  laterally. The balloon dilator trocar was introduced into the  preperitoneal space and advanced until the tip of the trocar touched the  pubic symphysis. This balloon was inflated 30 times and pressure was  held for 2 minutes. This was then reinflated and held for 1 minute.  The balloon portion was removed and the retention balloon inflated. CO2  was used to hold the preperitoneal space opened with a pressure of 12  mmHg.   The hernia dissectors were used to push the peritoneum posteriorly and  the abdominal wall anteriorly. This was done all the way up to the ASIS  bilaterally. The left side was addressed as this was the  symptomatic side. The hernia sac was immediately visible and was pulled  posteriorly off of the spermatic cord. The spermatic cord was dissected  away gently from the surrounding  structures. The vas deferens was  clearly seen and was avoided. The testicular vessels were also gently  retracted to elevate the spermatic cord.  The 3D Max mesh was selected.  This was inserted and overlapped medially and behind the pubic  tubercle. Tacks were placed both sides of the hernia taking care to  avoid the epigastric artery. This also placed just above the pubic  tubercle. Laterally, care was taken to make sure to apply pressure to  use the secure strap tack and avoid the lateral femoral cutaneous nerve.  This was done similarly on the left side. Once the mesh was laid flat  and tacked into place, pressure was held on the mesh laterally and below  the pubis while the air was evacuated from the preperitoneal space.   The trocars were removed and the anterior rectus fascia was closed with a  figure-of-eight 0 Vicryl and a UR-6. There was no residual palpable  fascial defect. Next, Exparel was injected in the remaining incisions  that were in the midline with a 5-mm trocars and the skin of all the  incisions were closed with 4-0 Monocryl. The wounds were then cleaned,  dried and dressed with Dermabond. The patient was awakened from  anesthesia and taken to PACU in stable condition. Needle, sponge and  instrument counts were correct.    Almond Lint, MD

## 2011-07-23 NOTE — Anesthesia Postprocedure Evaluation (Signed)
Anesthesia Post Note  Patient: Anthony King  Procedure(s) Performed:  LAPAROSCOPIC INGUINAL HERNIA  Anesthesia type: General  Patient location: PACU  Post pain: Pain level controlled  Post assessment: Post-op Vital signs reviewed  Last Vitals:  Filed Vitals:   07/23/11 1915  BP:   Pulse: 47  Temp:   Resp: 12    Post vital signs: Reviewed  Level of consciousness: sedated  Complications: No apparent anesthesia complications

## 2011-07-27 ENCOUNTER — Encounter (HOSPITAL_COMMUNITY): Payer: Self-pay | Admitting: General Surgery

## 2011-08-17 ENCOUNTER — Encounter (INDEPENDENT_AMBULATORY_CARE_PROVIDER_SITE_OTHER): Payer: Self-pay | Admitting: General Surgery

## 2011-08-17 ENCOUNTER — Ambulatory Visit (INDEPENDENT_AMBULATORY_CARE_PROVIDER_SITE_OTHER): Payer: BC Managed Care – PPO | Admitting: General Surgery

## 2011-08-17 VITALS — BP 136/82 | HR 68 | Temp 98.0°F | Resp 16 | Ht 71.0 in | Wt 228.6 lb

## 2011-08-17 DIAGNOSIS — K4090 Unilateral inguinal hernia, without obstruction or gangrene, not specified as recurrent: Secondary | ICD-10-CM

## 2011-08-17 NOTE — Assessment & Plan Note (Signed)
S/p lap LIH repair. At appropriate spot for post op time period.  Work as tolerated as long as lifting restrictions are being adhered to.    Follow up PRN.

## 2011-08-17 NOTE — Progress Notes (Signed)
Subjective:     Patient ID: Anthony King, male   DOB: Dec 23, 1950, 61 y.o.   MRN: [id number]  HPI S/p LIH 1.24.2013.  Still sore, but not taking any narcotics or other analgesics.  Moving much better.  Feeling occasional sharp pain when bending or twisting.    Review of Systems No fever/ chills.      Objective:   Physical Exam Incisions well healed.  Minimal swelling.      Assessment/Plan:     Left inguinal hernia S/p lap LIH repair. At appropriate spot for post op time period.  Work as tolerated as long as lifting restrictions are being adhered to.    Follow up PRN.

## 2011-08-17 NOTE — Patient Instructions (Signed)
May work part times as tolerated as long as not lifting over 30 pounds.  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

## 2011-09-04 ENCOUNTER — Encounter: Payer: Self-pay | Admitting: Gastroenterology

## 2011-09-29 ENCOUNTER — Telehealth: Payer: Self-pay | Admitting: *Deleted

## 2011-09-29 ENCOUNTER — Ambulatory Visit (AMBULATORY_SURGERY_CENTER): Payer: BC Managed Care – PPO | Admitting: *Deleted

## 2011-09-29 ENCOUNTER — Encounter: Payer: Self-pay | Admitting: Gastroenterology

## 2011-09-29 VITALS — Ht 70.5 in | Wt 228.5 lb

## 2011-09-29 DIAGNOSIS — Z860100 Personal history of colon polyps, unspecified: Secondary | ICD-10-CM

## 2011-09-29 DIAGNOSIS — Z1211 Encounter for screening for malignant neoplasm of colon: Secondary | ICD-10-CM

## 2011-09-29 MED ORDER — PEG-KCL-NACL-NASULF-NA ASC-C 100 G PO SOLR: ORAL | Status: DC

## 2011-09-29 NOTE — Telephone Encounter (Signed)
I have his office chart; I'll put it on your desk.

## 2011-09-29 NOTE — Telephone Encounter (Signed)
Dr. Arlyce Dice,  Pt has a family hx of stomach CA- his father died "years ago" and his sister was dx 15-May-2011 and died July 15, 2011.  He requests to have an EGD with his colonoscopy.   Is it ok to schedule a double?  Thanks, [employer]

## 2011-09-29 NOTE — Telephone Encounter (Signed)
If he is a direct schedule colonoscopy within I should see him in the office first before we schedule a double procedure.

## 2011-09-29 NOTE — Progress Notes (Signed)
Pt has a family hx of stomach CA- his father died "years ago" and his sister was dx 04-30-11 and died 30-Jun-2011.  He requests to have an EGD with his colonoscopy.

## 2011-09-29 NOTE — Telephone Encounter (Signed)
ok 

## 2011-09-29 NOTE — Telephone Encounter (Signed)
Dr. Arlyce Dice, He is a recall colonoscopy.  His last colonoscopy was in 2005

## 2011-09-30 NOTE — Telephone Encounter (Signed)
Called pt and scheduled for office visit to evaluate for EGD per Dr. Arlyce Dice

## 2011-10-01 ENCOUNTER — Telehealth: Payer: Self-pay | Admitting: Gastroenterology

## 2011-10-01 NOTE — Telephone Encounter (Signed)
After discussing with pt he decided to leave everything scheduled as it is.

## 2011-10-13 ENCOUNTER — Encounter: Payer: BC Managed Care – PPO | Admitting: Gastroenterology

## 2011-10-28 ENCOUNTER — Encounter: Payer: Self-pay | Admitting: Gastroenterology

## 2011-10-28 ENCOUNTER — Ambulatory Visit (INDEPENDENT_AMBULATORY_CARE_PROVIDER_SITE_OTHER): Payer: BC Managed Care – PPO | Admitting: Gastroenterology

## 2011-10-28 VITALS — BP 140/78 | HR 64 | Ht 70.5 in | Wt 223.6 lb

## 2011-10-28 DIAGNOSIS — K219 Gastro-esophageal reflux disease without esophagitis: Secondary | ICD-10-CM

## 2011-10-28 DIAGNOSIS — Z860100 Personal history of colon polyps, unspecified: Secondary | ICD-10-CM | POA: Insufficient documentation

## 2011-10-28 NOTE — Assessment & Plan Note (Signed)
Plan followup colonoscopy 

## 2011-10-28 NOTE — Patient Instructions (Signed)
Colonoscopy A colonoscopy is an exam to evaluate your entire colon. In this exam, your colon is cleansed. A long fiberoptic tube is inserted through your rectum and into your colon. The fiberoptic scope (endoscope) is a long bundle of enclosed and very flexible fibers. These fibers transmit light to the area examined and send images from that area to your caregiver. Discomfort is usually minimal. You may be given a drug to help you sleep (sedative) during or prior to the procedure. This exam helps to detect lumps (tumors), polyps, inflammation, and areas of bleeding. Your caregiver may also take a small piece of tissue (biopsy) that will be examined under a microscope. LET YOUR CAREGIVER KNOW ABOUT:   Allergies to food or medicine.   Medicines taken, including vitamins, herbs, eyedrops, over-the-counter medicines, and creams.   Use of steroids (by mouth or creams).   Previous problems with anesthetics or numbing medicines.   History of bleeding problems or blood clots.   Previous surgery.   Other health problems, including diabetes and kidney problems.   Possibility of pregnancy, if this applies.  BEFORE THE PROCEDURE   A clear liquid diet may be required for 2 days before the exam.   Ask your caregiver about changing or stopping your regular medications.   Liquid injections (enemas) or laxatives may be required.   A large amount of electrolyte solution may be given to you to drink over a short period of time. This solution is used to clean out your colon.   You should be present 60 minutes prior to your procedure or as directed by your caregiver.  AFTER THE PROCEDURE   If you received a sedative or pain relieving medication, you will need to arrange for someone to drive you home.   Occasionally, there is a little blood passed with the first bowel movement. Do not be concerned.  FINDING OUT THE RESULTS OF YOUR TEST Not all test results are available during your visit. If your test  results are not back during the visit, make an appointment with your caregiver to find out the results. Do not assume everything is normal if you have not heard from your caregiver or the medical facility. It is important for you to follow up on all of your test results. HOME CARE INSTRUCTIONS   It is not unusual to pass moderate amounts of gas and experience mild abdominal cramping following the procedure. This is due to air being used to inflate your colon during the exam. Walking or a warm pack on your belly (abdomen) may help.   You may resume all normal meals and activities after sedatives and medicines have worn off.   Only take over-the-counter or prescription medicines for pain, discomfort, or fever as directed by your caregiver. Do not use aspirin or blood thinners if a biopsy was taken. Consult your caregiver for medicine usage if biopsies were taken.  SEEK IMMEDIATE MEDICAL CARE IF:   You have a fever.   You pass large blood clots or fill a toilet with blood following the procedure. This may also occur 10 to 14 days following the procedure. This is more likely if a biopsy was taken.   You develop abdominal pain that keeps getting worse and cannot be relieved with medicine.  Document Released: 06/12/2000 Document Revised: 06/04/2011 Document Reviewed: 01/26/2008 Exit[employer] Patient Information 2012 [employer]. 

## 2011-10-28 NOTE — Progress Notes (Signed)
History of Present Illness: Anthony King is a pleasant 61 year old white male referred at the request of Dr. Laury Axon for GI issues including history of colon polyps and GERD. He has had several colonoscopies that have demonstrated small adenomatous polyps which were removed. Last exam was 2005. He's had reflux for years which is well controlled with PPI therapy. Should he miss his daily dose he will develop chest burning  And discomfort.  He underwent an endoscopy in 1998. Family history is pertinent for his father and sister who both had stomach cancer.  Past Medical History  Diagnosis Date  . Hyperlipidemia   . Gastric ulcer   . GERD (gastroesophageal reflux disease)   . Chronic kidney disease 07-17-11    past kidney stone x1  . Arthritis 07-17-11    hands, hips, knees  . Basal cell carcinoma     nose  . Diverticulosis   . Colon polyps     ADENOMATOUS AND HYPERPLASTIC POLYPS  . Hemorrhoids    Past Surgical History  Procedure Date  . Appendectomy   . Fatty tumors 07-17-11    excised x4- 1 -neck, 1 arm, 2 chest  . Inguinal hernia repair 07/23/2011    Procedure: LAPAROSCOPIC INGUINAL HERNIA;  Surgeon: Almond Lint, MD;  Location: [facility];  Service: General;  Laterality: Left;  . Colonoscopy   . Basal cell carcinoma excision     nose   family history includes Arthritis in his maternal grandmother, mother, and paternal grandmother; Breast cancer in his mother; Colon cancer in his sister; and Stomach cancer in his father and sister.  There is no history of Esophageal cancer. Current Outpatient Prescriptions  Medication Sig Dispense Refill  . folic acid (FOLVITE) 800 MCG tablet Take 800 mcg by mouth daily.      . Multiple Vitamins-Minerals (MULTIVITAMIN PO) Take 1 tablet by mouth daily.      Marland Kitchen omeprazole (PRILOSEC) 20 MG capsule Take 20 mg by mouth daily before breakfast.      . peg 3350 powder (MOVIPREP) SOLR Moviprep as directed  1 kit  0  . vitamin B-12 (CYANOCOBALAMIN) 1000 MCG tablet  Take 2,000 mcg by mouth daily.        Allergies as of 10/28/2011 - Review Complete 10/28/2011  Allergen Reaction Noted  . Aspirin Other (See Comments) 06/14/2009    reports that he quit smoking about 20 years ago. He has never used smokeless tobacco. He reports that he does not drink alcohol or use illicit drugs.     Review of Systems:  he complains of muscle soreness and arthritic pain in his wrists hips and knees.Pertinent positive and negative review of systems were noted in the above HPI section. All other review of systems were otherwise negative.  Vital signs were reviewed in today's medical record Physical Exam: General: Well developed , well nourished, no acute distress Head: Normocephalic and atraumatic Eyes:  sclerae anicteric, EOMI Ears: Normal auditory acuity Mouth: No deformity or lesions Neck: Supple, no masses or thyromegaly Lungs: Clear throughout to auscultation Heart: Regular rate and rhythm; no murmurs, rubs or bruits Abdomen: Soft, non tender and non distended. No masses, hepatosplenomegaly or hernias noted. Normal Bowel sounds Rectal:deferred Musculoskeletal: Symmetrical with no gross deformities  Skin: No lesions on visible extremities Pulses:  Normal pulses noted Extremities: No clubbing, cyanosis, edema or deformities noted Neurological: Alert oriented x 4, grossly nonfocal Cervical Nodes:  No significant cervical adenopathy Inguinal Nodes: No significant inguinal adenopathy Psychological:  Alert and cooperative. Normal mood  and affect

## 2011-10-28 NOTE — Assessment & Plan Note (Signed)
His long-standing GERD which is well controlled with omeprazole. Family history is pertinent for father and sister with stomach cancer.  Recommendations #1 continue omeprazole #2 upper endoscopy

## 2011-11-03 ENCOUNTER — Ambulatory Visit (AMBULATORY_SURGERY_CENTER): Payer: BC Managed Care – PPO | Admitting: Gastroenterology

## 2011-11-03 ENCOUNTER — Encounter: Payer: Self-pay | Admitting: Gastroenterology

## 2011-11-03 VITALS — BP 130/84 | HR 66 | Temp 98.9°F | Resp 15 | Ht 70.5 in | Wt 223.0 lb

## 2011-11-03 DIAGNOSIS — Z86010 Personal history of colonic polyps: Secondary | ICD-10-CM

## 2011-11-03 DIAGNOSIS — D126 Benign neoplasm of colon, unspecified: Secondary | ICD-10-CM

## 2011-11-03 DIAGNOSIS — K219 Gastro-esophageal reflux disease without esophagitis: Secondary | ICD-10-CM

## 2011-11-03 MED ORDER — SODIUM CHLORIDE 0.9 % IV SOLN: 500.0000 mL | INTRAVENOUS | Status: DC

## 2011-11-03 NOTE — Progress Notes (Signed)
The pt tolerated the colonoscopy very well. Maw   

## 2011-11-03 NOTE — Progress Notes (Signed)
Patient did not experience any of the following events: a burn prior to discharge; a fall within the facility; wrong site/side/patient/procedure/implant event; or a hospital transfer or hospital admission upon discharge from the facility. (G8907) Patient did not have preoperative order for IV antibiotic SSI prophylaxis. (G8918)  

## 2011-11-03 NOTE — Op Note (Signed)
[facility] [address] [place], [place]  [address]  ENDOSCOPY PROCEDURE REPORT  PATIENT:  Anthony King, Anthony King  MR#:  [id number] BIRTHDATE:  29-Mar-1951, 60 yrs. old  GENDER:  male  ENDOSCOPIST:  Barbette Hair. Arlyce Dice, MD Referred by:  PROCEDURE DATE:  11/03/2011 PROCEDURE:  EGD, diagnostic 43235 ASA CLASS:  Class II INDICATIONS:  GERD  MEDICATIONS:   There was residual sedation effect present from prior procedure., glycopyrrolate (Robinal) 0.2 mg IV, 0.6cc simethancone 0.6 cc PO TOPICAL ANEST2DESCRIPTION OF PROCEDURE:   After the risks and benefits of the procedure were explained, informed consent was obtained.  The LB [id number]-[id number] endoscope was introduced through the mouth and advanced to the third portion of the duodenum.  The instrument was slowly withdrawn as the mucosa was fully examined. <<PROCEDUREIMAGES>>  The upper, middle, and distal third of the esophagus were carefully inspected and no abnormalities were noted. The z-line was well seen at the GEJ. The endoscope was pushed into the fundus which was normal including a retroflexed view. The antrum,gastric body, first and second part of the duodenum were unremarkable (see image1, image2, image3, and image4).    Retroflexed views revealed no abnormalities.    The scope was then withdrawn from the patient and the procedure completed.  COMPLICATIONS:  None  ENDOSCOPIC IMPRESSION: 1) Normal EGD RECOMMENDATIONS: 1) continue PPI  ______________________________ Barbette Hair. Arlyce Dice, MD  CC:  Lelon Perla, DO  n. eSIGNED:   Barbette Hair. Shanikqua Zarzycki at 11/03/2011 03:55 PM  Salli Real, [id number]

## 2011-11-03 NOTE — Progress Notes (Signed)
The pt tolerated the egd very well. Maw   

## 2011-11-03 NOTE — Patient Instructions (Signed)
YOU HAD AN ENDOSCOPIC PROCEDURE TODAY AT THE [facility]: Refer to the procedure report that was given to you for any specific questions about what was found during the examination.  If the procedure report does not answer your questions, please call your gastroenterologist to clarify.  If you requested that your care partner not be given the details of your procedure findings, then the procedure report has been included in a sealed envelope for you to review at your convenience later.  YOU SHOULD EXPECT: Some feelings of bloating in the abdomen. Passage of more gas than usual.  Walking can help get rid of the air that was put into your GI tract during the procedure and reduce the bloating. If you had a lower endoscopy (such as a colonoscopy or flexible sigmoidoscopy) you may notice spotting of blood in your stool or on the toilet paper. If you underwent a bowel prep for your procedure, then you may not have a normal bowel movement for a few days.  DIET: Your first meal following the procedure should be a light meal and then it is ok to progress to your normal diet.  A half-sandwich or bowl of soup is an example of a good first meal.  Heavy or fried foods are harder to digest and may make you feel nauseous or bloated.  Likewise meals heavy in dairy and vegetables can cause extra gas to form and this can also increase the bloating.  Drink plenty of fluids but you should avoid alcoholic beverages for 24 hours.  ACTIVITY: Your care partner should take you home directly after the procedure.  You should plan to take it easy, moving slowly for the rest of the day.  You can resume normal activity the day after the procedure however you should NOT DRIVE or use heavy machinery for 24 hours (because of the sedation medicines used during the test).    SYMPTOMS TO REPORT IMMEDIATELY: A gastroenterologist can be reached at any hour.  During normal business hours, 8:30 AM to 5:00 PM Monday through Friday,  call [phone].  After hours and on weekends, please call the GI answering service at [phone] who will take a message and have the physician on call contact you.   Following lower endoscopy (colonoscopy or flexible sigmoidoscopy):  Excessive amounts of blood in the stool  Significant tenderness or worsening of abdominal pains  Swelling of the abdomen that is new, acute  Fever of 100F or higher  Following upper endoscopy (EGD)  Vomiting of blood or coffee ground material  New chest pain or pain under the shoulder blades  Painful or persistently difficult swallowing  New shortness of breath  Fever of 100F or higher  Black, tarry-looking stools  FOLLOW UP: If any biopsies were taken you will be contacted by phone or by letter within the next 1-3 weeks.  Call your gastroenterologist if you have not heard about the biopsies in 3 weeks.  Our staff will call the home number listed on your records the next business day following your procedure to check on you and address any questions or concerns that you may have at that time regarding the information given to you following your procedure. This is a courtesy call and so if there is no answer at the home number and we have not heard from you through the emergency physician on call, we will assume that you have returned to your regular daily activities without incident.  SIGNATURES/CONFIDENTIALITY: You and/or your care   partner have signed paperwork which will be entered into your electronic medical record.  These signatures attest to the fact that that the information above on your After Visit Summary has been reviewed and is understood.  Full responsibility of the confidentiality of this discharge information lies with you and/or your care-partner.  

## 2011-11-03 NOTE — Op Note (Signed)
[facility] [address] [place], [place]  [address]  COLONOSCOPY PROCEDURE REPORT  PATIENT:  Anthony King, Anthony King  MR#:  [id number] BIRTHDATE:  Jun 19, 1951, 60 yrs. old  GENDER:  male ENDOSCOPIST:  Barbette Hair. Arlyce Dice, MD REF. BY: PROCEDURE DATE:  11/03/2011 PROCEDURE:  Colonoscopy with snare polypectomy, Colon with cold biopsy polypectomy ASA CLASS:  Class II INDICATIONS:  Screening, history of pre-cancerous (adenomatous) colon polyps 2005 colon - polyps MEDICATIONS:   MAC sedation, administered by CRNA propofol 350mg IV  DESCRIPTION OF PROCEDURE:   After the risks benefits and alternatives of the procedure were thoroughly explained, informed consent was obtained.  Digital rectal exam was performed and revealed no abnormalities.   The LB CF-[id number] endoscope was introduced through the anus and advanced to the cecum, which was identified by both the appendix and ileocecal valve, without limitations.  The quality of the prep was excellent, using MoviPrep.  The instrument was then slowly withdrawn as the colon was fully examined. <<PROCEDUREIMAGES>>  FINDINGS:  There were multiple polyps identified and removed. 10 polyps ranging from 2-81mm in the cecum, ascending, transverse and descending colon were removed with cold snare or cold biopsy forceps. 7 polyps were located in the cecum, ascending and transverse colon. 8 polyps were retrieved (see image3, image4, image6, image7, image10, and image12).  Mild diverticulosis was found in the descending colon.  This was otherwise a normal examination of the colon (see image1 and image14).   Retroflexed views in the rectum revealed no abnormalities.    The time to cecum =  1) 2.75  minutes. The scope was then withdrawn in  1) 24.75  minutes from the cecum and the procedure completed. COMPLICATIONS:  None ENDOSCOPIC IMPRESSION: 1) Polyps, multiple 2) Mild diverticulosis in the descending colon 3) Otherwise normal  examination RECOMMENDATIONS: 1) Colonoscopy in 3 years b/c of colonic polyposis  REPEAT EXAM:  In 3 year(s) for Colonoscopy.  ______________________________ Barbette Hair. Arlyce Dice, MD  CC:  Lelon Perla, DO  n. eSIGNED:   Barbette Hair. Stephfon Bovey at 11/03/2011 03:54 PM  Anthony King, [id number]

## 2011-11-04 ENCOUNTER — Telehealth: Payer: Self-pay | Admitting: *Deleted

## 2011-11-04 NOTE — Telephone Encounter (Signed)
  Follow up Call-  Call back number 11/03/2011  Post procedure Call Back phone  # [phone]  Permission to leave phone message Yes     Patient questions:  Do you have a fever, pain , or abdominal swelling? no Pain Score  0 *  Have you tolerated food without any problems? yes  Have you been able to return to your normal activities? yes  Do you have any questions about your discharge instructions: Diet   no Medications  no Follow up visit  no  Do you have questions or concerns about your Care? no  Actions: * If pain score is 4 or above: No action needed, pain <4.

## 2011-11-11 ENCOUNTER — Encounter: Payer: Self-pay | Admitting: Gastroenterology

## 2012-01-18 ENCOUNTER — Encounter: Payer: Self-pay | Admitting: Family Medicine

## 2012-01-18 ENCOUNTER — Ambulatory Visit (INDEPENDENT_AMBULATORY_CARE_PROVIDER_SITE_OTHER): Payer: BC Managed Care – PPO | Admitting: Family Medicine

## 2012-01-18 VITALS — BP 116/68 | HR 73 | Temp 98.0°F | Wt 219.0 lb

## 2012-01-18 DIAGNOSIS — W57XXXA Bitten or stung by nonvenomous insect and other nonvenomous arthropods, initial encounter: Secondary | ICD-10-CM

## 2012-01-18 DIAGNOSIS — T148 Other injury of unspecified body region: Secondary | ICD-10-CM

## 2012-01-18 DIAGNOSIS — T148XXA Other injury of unspecified body region, initial encounter: Secondary | ICD-10-CM

## 2012-01-18 MED ORDER — CEPHALEXIN 500 MG PO CAPS: 500.0000 mg | ORAL_CAPSULE | Freq: Two times a day (BID) | ORAL | Status: AC

## 2012-01-18 NOTE — Progress Notes (Signed)
  Subjective:    Patient ID: Anthony King, male    DOB: [birth year], 61 y.o.   MRN: [id number]  HPI  Pt here c/o L hand swelling since weekend.  No known injury.  Pt was carrying a piece of wood in house and it dropped out of his hand.     Review of Systems As above    Objective:   Physical Exam  Constitutional: He is oriented to person, place, and time. He appears well-developed and well-nourished.  Musculoskeletal: He exhibits edema and tenderness.       L hand --swelling and tenderness over 1st metacarpal phlange + errythema Good strength in hand  Neurological: He is alert and oriented to person, place, and time.  Psychiatric: He has a normal mood and affect. His behavior is normal. Judgment and thought content normal.          Assessment & Plan:  L hand swelling --- ? Insect bite                                With cellulitis           Keflex for 10 days            Antihistamine daily            If no better in 2-3 days --- will refer

## 2012-01-18 NOTE — Patient Instructions (Signed)
Insect Bite Mosquitoes, flies, fleas, bedbugs, and many other insects can bite. Insect bites are different from insect stings. A sting is when venom is injected into the skin. Some insect bites can transmit infectious diseases. SYMPTOMS  Insect bites usually turn red, swell, and itch for 2 to 4 days. They often go away on their own. TREATMENT  Your caregiver may prescribe antibiotic medicines if a bacterial infection develops in the bite. HOME CARE INSTRUCTIONS  Do not scratch the bite area.   Keep the bite area clean and dry. Wash the bite area thoroughly with soap and water.   Put ice or cool compresses on the bite area.   Put ice in a plastic bag.   Place a towel between your skin and the bag.   Leave the ice on for 20 minutes, 4 times a day for the first 2 to 3 days, or as directed.   You may apply a baking soda paste, cortisone cream, or calamine lotion to the bite area as directed by your caregiver. This can help reduce itching and swelling.   Only take over-the-counter or prescription medicines as directed by your caregiver.   If you are given antibiotics, take them as directed. Finish them even if you start to feel better.  You may need a tetanus shot if:  You cannot remember when you had your last tetanus shot.   You have never had a tetanus shot.   The injury broke your skin.  If you get a tetanus shot, your arm may swell, get red, and feel warm to the touch. This is common and not a problem. If you need a tetanus shot and you choose not to have one, there is a rare chance of getting tetanus. Sickness from tetanus can be serious. SEEK IMMEDIATE MEDICAL CARE IF:   You have increased pain, redness, or swelling in the bite area.   You see a red line on the skin coming from the bite.   You have a fever.   You have joint pain.   You have a headache or neck pain.   You have unusual weakness.   You have a rash.   You have chest pain or shortness of breath.   You  have abdominal pain, nausea, or vomiting.   You feel unusually tired or sleepy.  MAKE SURE YOU:   Understand these instructions.   Will watch your condition.   Will get help right away if you are not doing well or get worse.  Document Released: 07/23/2004 Document Revised: 06/04/2011 Document Reviewed: 01/14/2011 Exit[employer] Patient Information 2012 [employer]. 

## 2012-09-21 ENCOUNTER — Encounter: Payer: Self-pay | Admitting: Family Medicine

## 2012-09-21 ENCOUNTER — Ambulatory Visit (INDEPENDENT_AMBULATORY_CARE_PROVIDER_SITE_OTHER): Payer: BC Managed Care – PPO | Admitting: Family Medicine

## 2012-09-21 ENCOUNTER — Ambulatory Visit (INDEPENDENT_AMBULATORY_CARE_PROVIDER_SITE_OTHER)
Admission: RE | Admit: 2012-09-21 | Discharge: 2012-09-21 | Disposition: A | Discharge status: Home / Self Care | Payer: BC Managed Care – PPO | Source: Ambulatory Visit | Attending: Family Medicine | Admitting: Family Medicine

## 2012-09-21 VITALS — BP 118/62 | HR 90 | Temp 100.0°F | Wt 221.0 lb

## 2012-09-21 DIAGNOSIS — J209 Acute bronchitis, unspecified: Secondary | ICD-10-CM

## 2012-09-21 DIAGNOSIS — R05 Cough: Secondary | ICD-10-CM

## 2012-09-21 DIAGNOSIS — R059 Cough, unspecified: Secondary | ICD-10-CM

## 2012-09-21 MED ORDER — ALBUTEROL SULFATE (5 MG/ML) 0.5% IN NEBU
2.5000 mg | INHALATION_SOLUTION | Freq: Once | RESPIRATORY_TRACT | Status: AC
  Administered 2012-09-21: 2.5 mg via RESPIRATORY_TRACT

## 2012-09-21 MED ORDER — IPRATROPIUM BROMIDE 0.02 % IN SOLN
0.5000 mg | Freq: Once | RESPIRATORY_TRACT | Status: AC
  Administered 2012-09-21: 0.5 mg via RESPIRATORY_TRACT

## 2012-09-21 MED ORDER — METHYLPREDNISOLONE ACETATE 80 MG/ML IJ SUSP
80.0000 mg | Freq: Once | INTRAMUSCULAR | Status: AC
  Administered 2012-09-21: 80 mg via INTRAMUSCULAR

## 2012-09-21 MED ORDER — PREDNISONE 10 MG PO TABS: ORAL_TABLET | ORAL | Status: DC

## 2012-09-21 MED ORDER — HYDROCOD POLST-CHLORPHEN POLST 10-8 MG/5ML PO LQCR: 5.0000 mL | Freq: Every evening | ORAL | Status: DC | PRN

## 2012-09-21 MED ORDER — LEVOFLOXACIN 500 MG PO TABS: 500.0000 mg | ORAL_TABLET | Freq: Every day | ORAL | Status: DC

## 2012-09-21 NOTE — Patient Instructions (Signed)

## 2012-09-21 NOTE — Progress Notes (Signed)
  Subjective:     Anthony King is a 62 y.o. male here for evaluation of a cough. Onset of symptoms was several days ago. Symptoms have been gradually worsening since that time. The cough is productive and is aggravated by cold air, infection and reclining position. Associated symptoms include: chills, fever, postnasal drip, shortness of breath, sputum production and wheezing. Patient does not have a history of asthma. Patient does not have a history of environmental allergens. Patient has not traveled recently. Patient does not have a history of smoking. Patient has not had a previous chest x-ray. Patient has not had a PPD done.  The following portions of the patient's history were reviewed and updated as appropriate: allergies, current medications, past family history, past medical history, past social history, past surgical history and problem list.  Review of Systems Pertinent items are noted in HPI.    Objective:    Oxygen saturation 95% on room air BP 118/62  Pulse 90  Temp(Src) 100 F (37.8 C) (Oral)  Wt 221 lb (100.245 kg)  BMI 31.25 kg/m2  SpO2 95% General appearance: alert, cooperative, appears stated age and no distress Neck: no adenopathy, supple, symmetrical, trachea midline and thyroid not enlarged, symmetric, no tenderness/mass/nodules Lungs: rales bilaterally, rhonchi bilaterally and wheezes bilaterally Heart: S1, S2 normal    Assessment:    Acute Bronchitis    Plan:    Antibiotics per medication orders. Antitussives per medication orders. Avoid exposure to tobacco smoke and fumes. Call if shortness of breath worsens, blood in sputum, change in character of cough, development of fever or chills, inability to maintain nutrition and hydration. Avoid exposure to tobacco smoke and fumes. Chest x-ray. steroid taper

## 2012-09-21 NOTE — Addendum Note (Signed)
Addended by: Verdie Shire on: 09/21/2012 02:41 PM   Modules accepted: Orders

## 2013-08-21 ENCOUNTER — Encounter (INDEPENDENT_AMBULATORY_CARE_PROVIDER_SITE_OTHER): Payer: Self-pay | Admitting: General Surgery

## 2013-08-21 ENCOUNTER — Encounter (INDEPENDENT_AMBULATORY_CARE_PROVIDER_SITE_OTHER): Payer: Self-pay

## 2013-08-21 ENCOUNTER — Ambulatory Visit (INDEPENDENT_AMBULATORY_CARE_PROVIDER_SITE_OTHER): Payer: BC Managed Care – PPO | Admitting: General Surgery

## 2013-08-21 ENCOUNTER — Other Ambulatory Visit (INDEPENDENT_AMBULATORY_CARE_PROVIDER_SITE_OTHER): Payer: Self-pay | Admitting: General Surgery

## 2013-08-21 ENCOUNTER — Ambulatory Visit
Admission: RE | Admit: 2013-08-21 | Discharge: 2013-08-21 | Disposition: A | Discharge status: Home / Self Care | Payer: BC Managed Care – PPO | Source: Ambulatory Visit | Attending: General Surgery | Admitting: General Surgery

## 2013-08-21 VITALS — BP 136/80 | HR 80 | Temp 98.0°F | Resp 14 | Ht 70.0 in | Wt 227.2 lb

## 2013-08-21 DIAGNOSIS — R222 Localized swelling, mass and lump, trunk: Secondary | ICD-10-CM

## 2013-08-21 DIAGNOSIS — R1900 Intra-abdominal and pelvic swelling, mass and lump, unspecified site: Secondary | ICD-10-CM

## 2013-08-21 NOTE — Addendum Note (Signed)
Addended by: Stark Klein on: 08/21/2013 09:41 AM   Modules accepted: Orders

## 2013-08-21 NOTE — Addendum Note (Signed)
Addended byFlossie Buffy on: 08/21/2013 10:35 AM   Modules accepted: Orders

## 2013-08-21 NOTE — Assessment & Plan Note (Addendum)
We'll plan excision of this mass.  The mass is subfascial.  Summary difficult to excise in the office due to the size of the subfascial nature of it. I think the muscle is at higher risk for bleeding.  I reviewed the risks of surgery including bleeding, infection, damage to adjacent structures, continued pain, and the possibility of a malignant pathology. Patient understands and wishes to proceed.  I reviewed postoperative restrictions.

## 2013-08-21 NOTE — Progress Notes (Signed)
Subjective:     Patient ID: Anthony King, male   DOB: 07/25/1950, 62 y.o.   MRN: [id number]  HPI Patient is a 62-year-old male upon whom I repaired a left inguinal hernia and removed a chest wall mass around 2 years ago. He returns with a painful left flank mass. This has been there for around 6 months to a year. He has noticed during the past few months it has started to enlarge. It is extremely sore and hurts every time he moves. He holds himself relatively stiff in order to decrease his discomfort.  He denies redness or drainage. 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  Review of Systems  All other systems reviewed and are negative.       Objective:   Physical Exam  Constitutional: He is oriented to person, place, and time. He appears well-developed and well-nourished. No distress.  HENT:  Head: Normocephalic and atraumatic.  Eyes: Conjunctivae are normal. Pupils are equal, round, and reactive to light. No scleral icterus.  Neck: Normal range of motion.  Cardiovascular: Normal rate, regular rhythm and intact distal pulses.   Pulmonary/Chest: Effort normal.   He exhibits mass and tenderness.    Left flank mass tender, in muscle wall.  Around 3 cm.  Relatively firm.  Right chest wall mass softer, mobile, tender, lateral to previous incision around 1 cm in diameter.    Abdominal: Soft. He exhibits no distension. There is no tenderness.  Musculoskeletal:  Holding himself sl stiff  Neurological: He is alert and oriented to person, place, and time.  Skin: Skin is warm and dry. No rash noted. He is not diaphoretic. No erythema. No pallor.  Psychiatric: He has a normal mood and affect. His behavior is normal. Thought content normal.       Assessment/Plan:     Left flank mass, 3 cm subfascial We'll plan excision  of this mass.  The mass is subfascial.  Summary difficult to excise in the office due to the size of the subfascial nature of it. I think the muscle is at higher risk for bleeding.  I reviewed the risks of surgery including bleeding, infection, damage to adjacent structures, continued pain, and the possibility of a malignant pathology. Patient understands and wishes to proceed.  I reviewed postoperative restrictions.  Mass of chest wall, right, 1 cm, subcutaneous. The patient's previous scar has an area lateral tear that is mobile and tender. I will plan to remove this as well.     

## 2013-08-21 NOTE — Patient Instructions (Signed)
Main risks are bleeding, infection, and wound healing issues.  No shower for 2 days post op, no lifting or strenuous activity for 1 week.

## 2013-08-21 NOTE — Assessment & Plan Note (Signed)
The patient's previous scar has an area lateral tear that is mobile and tender. I will plan to remove this as well.

## 2013-08-23 ENCOUNTER — Encounter (HOSPITAL_BASED_OUTPATIENT_CLINIC_OR_DEPARTMENT_OTHER): Payer: Self-pay | Admitting: *Deleted

## 2013-08-29 ENCOUNTER — Ambulatory Visit (HOSPITAL_BASED_OUTPATIENT_CLINIC_OR_DEPARTMENT_OTHER): Payer: BC Managed Care – PPO | Admitting: Anesthesiology

## 2013-08-29 ENCOUNTER — Encounter (HOSPITAL_BASED_OUTPATIENT_CLINIC_OR_DEPARTMENT_OTHER): Payer: Self-pay | Admitting: *Deleted

## 2013-08-29 ENCOUNTER — Encounter (HOSPITAL_BASED_OUTPATIENT_CLINIC_OR_DEPARTMENT_OTHER): Payer: BC Managed Care – PPO | Admitting: Anesthesiology

## 2013-08-29 ENCOUNTER — Ambulatory Visit (HOSPITAL_BASED_OUTPATIENT_CLINIC_OR_DEPARTMENT_OTHER)
Admission: RE | Admit: 2013-08-29 | Discharge: 2013-08-29 | Disposition: A | Discharge status: Home / Self Care | Payer: BC Managed Care – PPO | Source: Ambulatory Visit | Attending: General Surgery | Admitting: General Surgery

## 2013-08-29 ENCOUNTER — Encounter (HOSPITAL_BASED_OUTPATIENT_CLINIC_OR_DEPARTMENT_OTHER)
Admission: RE | Disposition: A | Discharge status: Home / Self Care | Payer: Self-pay | Source: Ambulatory Visit | Attending: General Surgery

## 2013-08-29 DIAGNOSIS — D1779 Benign lipomatous neoplasm of other sites: Secondary | ICD-10-CM | POA: Insufficient documentation

## 2013-08-29 DIAGNOSIS — D1739 Benign lipomatous neoplasm of skin and subcutaneous tissue of other sites: Secondary | ICD-10-CM

## 2013-08-29 HISTORY — PX: MASS EXCISION: SHX2000

## 2013-08-29 HISTORY — DX: Presence of spectacles and contact lenses: Z97.3

## 2013-08-29 LAB — POCT HEMOGLOBIN-HEMACUE: HEMOGLOBIN: 12.6 g/dL — AB (ref 13.0–17.0)

## 2013-08-29 SURGERY — EXCISION MASS
Anesthesia: General | Site: Chest

## 2013-08-29 MED ORDER — BUPIVACAINE HCL (PF) 0.25 % IJ SOLN
INTRAMUSCULAR | Status: AC
  Filled 2013-08-29: qty 30

## 2013-08-29 MED ORDER — OXYCODONE HCL 5 MG PO TABS
ORAL_TABLET | ORAL | Status: AC
Start: 2013-08-29 — End: 2013-08-29
  Filled 2013-08-29: qty 1

## 2013-08-29 MED ORDER — LACTATED RINGERS IV SOLN
INTRAVENOUS | Status: DC
  Administered 2013-08-29 (×2): via INTRAVENOUS

## 2013-08-29 MED ORDER — LIDOCAINE HCL (CARDIAC) 20 MG/ML IV SOLN
INTRAVENOUS | Status: DC | PRN
  Administered 2013-08-29: 100 mg via INTRAVENOUS

## 2013-08-29 MED ORDER — OXYCODONE-ACETAMINOPHEN 5-325 MG PO TABS: 1.0000 | ORAL_TABLET | ORAL | Status: DC | PRN

## 2013-08-29 MED ORDER — MIDAZOLAM HCL 2 MG/2ML IJ SOLN
INTRAMUSCULAR | Status: AC
  Filled 2013-08-29: qty 2

## 2013-08-29 MED ORDER — LIDOCAINE-EPINEPHRINE (PF) 1 %-1:200000 IJ SOLN
INTRAMUSCULAR | Status: DC | PRN
  Administered 2013-08-29: 13 mL

## 2013-08-29 MED ORDER — FENTANYL CITRATE 0.05 MG/ML IJ SOLN: 50.0000 ug | INTRAMUSCULAR | Status: DC | PRN

## 2013-08-29 MED ORDER — HYDROMORPHONE HCL PF 1 MG/ML IJ SOLN
INTRAMUSCULAR | Status: AC
  Filled 2013-08-29: qty 1

## 2013-08-29 MED ORDER — HYDROMORPHONE HCL PF 1 MG/ML IJ SOLN
0.2500 mg | INTRAMUSCULAR | Status: DC | PRN
  Administered 2013-08-29: 0.5 mg via INTRAVENOUS
  Administered 2013-08-29: 0.25 mg via INTRAVENOUS

## 2013-08-29 MED ORDER — FENTANYL CITRATE 0.05 MG/ML IJ SOLN
INTRAMUSCULAR | Status: DC | PRN
  Administered 2013-08-29: 100 ug via INTRAVENOUS

## 2013-08-29 MED ORDER — BUPIVACAINE HCL (PF) 0.25 % IJ SOLN
INTRAMUSCULAR | Status: DC | PRN
Start: 2013-08-29 — End: 2013-08-29
  Administered 2013-08-29: 13 mL

## 2013-08-29 MED ORDER — DEXAMETHASONE SODIUM PHOSPHATE 4 MG/ML IJ SOLN
INTRAMUSCULAR | Status: DC | PRN
  Administered 2013-08-29: 10 mg via INTRAVENOUS

## 2013-08-29 MED ORDER — CEFAZOLIN SODIUM-DEXTROSE 2-3 GM-% IV SOLR
2.0000 g | INTRAVENOUS | Status: AC
  Administered 2013-08-29: 2 g via INTRAVENOUS

## 2013-08-29 MED ORDER — PROPOFOL 10 MG/ML IV BOLUS
INTRAVENOUS | Status: DC | PRN
  Administered 2013-08-29: 150 mg via INTRAVENOUS

## 2013-08-29 MED ORDER — MIDAZOLAM HCL 2 MG/2ML IJ SOLN: 1.0000 mg | INTRAMUSCULAR | Status: DC | PRN

## 2013-08-29 MED ORDER — FENTANYL CITRATE 0.05 MG/ML IJ SOLN
INTRAMUSCULAR | Status: AC
  Filled 2013-08-29: qty 6

## 2013-08-29 MED ORDER — MEPERIDINE HCL 25 MG/ML IJ SOLN: 6.2500 mg | INTRAMUSCULAR | Status: DC | PRN

## 2013-08-29 MED ORDER — ONDANSETRON HCL 4 MG/2ML IJ SOLN: 4.0000 mg | Freq: Once | INTRAMUSCULAR | Status: DC | PRN

## 2013-08-29 MED ORDER — CHLORHEXIDINE GLUCONATE 4 % EX LIQD
1.0000 | Freq: Once | CUTANEOUS | Status: DC
Start: 2013-08-29 — End: 2013-08-29

## 2013-08-29 MED ORDER — MIDAZOLAM HCL 5 MG/5ML IJ SOLN
INTRAMUSCULAR | Status: DC | PRN
  Administered 2013-08-29: 2 mg via INTRAVENOUS

## 2013-08-29 MED ORDER — ONDANSETRON HCL 4 MG/2ML IJ SOLN
INTRAMUSCULAR | Status: DC | PRN
  Administered 2013-08-29: 4 mg via INTRAVENOUS

## 2013-08-29 MED ORDER — OXYCODONE HCL 5 MG/5ML PO SOLN: 5.0000 mg | Freq: Once | ORAL | Status: AC | PRN

## 2013-08-29 MED ORDER — OXYCODONE HCL 5 MG PO TABS
5.0000 mg | ORAL_TABLET | Freq: Once | ORAL | Status: AC | PRN
  Administered 2013-08-29: 5 mg via ORAL

## 2013-08-29 MED ORDER — CEFAZOLIN SODIUM-DEXTROSE 2-3 GM-% IV SOLR
INTRAVENOUS | Status: AC
Start: 2013-08-29 — End: 2013-08-29
  Filled 2013-08-29: qty 50

## 2013-08-29 SURGICAL SUPPLY — 48 items
ADH SKN CLS APL DERMABOND .7 (GAUZE/BANDAGES/DRESSINGS) ×1
APL SKNCLS STERI-STRIP NONHPOA (GAUZE/BANDAGES/DRESSINGS) ×2
BENZOIN TINCTURE PRP APPL 2/3 (GAUZE/BANDAGES/DRESSINGS) ×2 IMPLANT
BLADE HEX COATED 2.75 (ELECTRODE) ×2 IMPLANT
BLADE SURG 15 STRL LF DISP TIS (BLADE) ×1 IMPLANT
BLADE SURG 15 STRL SS (BLADE) ×2
CANISTER SUCT 1200ML W/VALVE (MISCELLANEOUS) IMPLANT
CHLORAPREP W/TINT 26ML (MISCELLANEOUS) ×2 IMPLANT
COVER MAYO STAND STRL (DRAPES) ×1 IMPLANT
COVER TABLE BACK 60X90 (DRAPES) ×1 IMPLANT
DECANTER SPIKE VIAL GLASS SM (MISCELLANEOUS) IMPLANT
DERMABOND ADVANCED (GAUZE/BANDAGES/DRESSINGS) ×1
DERMABOND ADVANCED .7 DNX12 (GAUZE/BANDAGES/DRESSINGS) ×1 IMPLANT
DRAPE PED LAPAROTOMY (DRAPES) ×1 IMPLANT
DRAPE UTILITY XL STRL (DRAPES) ×2 IMPLANT
DRSG TEGADERM 4X4.75 (GAUZE/BANDAGES/DRESSINGS) ×2 IMPLANT
ELECT REM PT RETURN 9FT ADLT (ELECTROSURGICAL) ×2
ELECTRODE REM PT RTRN 9FT ADLT (ELECTROSURGICAL) ×1 IMPLANT
GLOVE BIO SURGEON STRL SZ 6 (GLOVE) ×2 IMPLANT
GLOVE BIOGEL PI IND STRL 6.5 (GLOVE) ×1 IMPLANT
GLOVE BIOGEL PI INDICATOR 6.5 (GLOVE) ×1
GOWN STRL REUS W/ TWL LRG LVL3 (GOWN DISPOSABLE) ×1 IMPLANT
GOWN STRL REUS W/TWL 2XL LVL3 (GOWN DISPOSABLE) ×2 IMPLANT
GOWN STRL REUS W/TWL LRG LVL3 (GOWN DISPOSABLE) ×2
NDL HYPO 25X1 1.5 SAFETY (NEEDLE) ×1 IMPLANT
NEEDLE HYPO 25X1 1.5 SAFETY (NEEDLE) ×2 IMPLANT
NS IRRIG 1000ML POUR BTL (IV SOLUTION) IMPLANT
PACK BASIN DAY SURGERY FS (CUSTOM PROCEDURE TRAY) ×2 IMPLANT
PACK UNIVERSAL I (CUSTOM PROCEDURE TRAY) IMPLANT
PENCIL BUTTON HOLSTER BLD 10FT (ELECTRODE) ×2 IMPLANT
SLEEVE SCD COMPRESS KNEE MED (MISCELLANEOUS) IMPLANT
SPONGE GAUZE 2X2 8PLY STRL LF (GAUZE/BANDAGES/DRESSINGS) ×2 IMPLANT
SPONGE GAUZE 4X4 12PLY STER LF (GAUZE/BANDAGES/DRESSINGS) ×2 IMPLANT
SPONGE LAP 4X18 X RAY DECT (DISPOSABLE) ×2 IMPLANT
STAPLER VISISTAT 35W (STAPLE) IMPLANT
STRIP CLOSURE SKIN 1/2X4 (GAUZE/BANDAGES/DRESSINGS) ×1 IMPLANT
SUT MNCRL AB 4-0 PS2 18 (SUTURE) ×2 IMPLANT
SUT SILK 3 0 TIES 17X18 (SUTURE)
SUT SILK 3-0 18XBRD TIE BLK (SUTURE) IMPLANT
SUT VIC AB 3-0 SH 27 (SUTURE) ×4
SUT VIC AB 3-0 SH 27X BRD (SUTURE) ×1 IMPLANT
SUT VICRYL 4-0 PS2 18IN ABS (SUTURE) ×1 IMPLANT
SYR CONTROL 10ML LL (SYRINGE) ×2 IMPLANT
TOWEL OR 17X24 6PK STRL BLUE (TOWEL DISPOSABLE) ×3 IMPLANT
TOWEL OR NON WOVEN STRL DISP B (DISPOSABLE) ×2 IMPLANT
TRAY DSU PREP LF (CUSTOM PROCEDURE TRAY) ×1 IMPLANT
TUBE CONNECTING 20X1/4 (TUBING) IMPLANT
YANKAUER SUCT BULB TIP NO VENT (SUCTIONS) IMPLANT

## 2013-08-29 NOTE — Anesthesia Procedure Notes (Signed)
Procedure Name: LMA Insertion Date/Time: 08/29/2013 3:05 PM Performed by: Maryella Shivers Pre-anesthesia Checklist: Patient identified, Emergency Drugs available, Suction available and Patient being monitored Patient Re-evaluated:Patient Re-evaluated prior to inductionOxygen Delivery Method: Circle System Utilized Preoxygenation: Pre-oxygenation with 100% oxygen Intubation Type: IV induction Ventilation: Mask ventilation without difficulty LMA: LMA inserted LMA Size: 5.0 Number of attempts: 1 Airway Equipment and Method: bite block Placement Confirmation: positive ETCO2 Tube secured with: Tape Dental Injury: Teeth and Oropharynx as per pre-operative assessment

## 2013-08-29 NOTE — Discharge Instructions (Addendum)
[place] Office Phone Number [phone]   POST OP INSTRUCTIONS  Always review your discharge instruction sheet given to you by the facility where your surgery was performed.  IF YOU HAVE DISABILITY OR FAMILY LEAVE FORMS, YOU MUST BRING THEM TO THE OFFICE FOR PROCESSING.  DO NOT GIVE THEM TO YOUR DOCTOR.  1. A prescription for pain medication may be given to you upon discharge.  Take your pain medication as prescribed, if needed.  If narcotic pain medicine is not needed, then you may take acetaminophen (Tylenol) or ibuprofen (Advil) as needed. 2. Take your usually prescribed medications unless otherwise directed 3. If you need a refill on your pain medication, please contact your pharmacy.  They will contact our office to request authorization.  Prescriptions will not be filled after 5pm or on week-ends. 4. You should eat very light the first 24 hours after surgery, such as soup, crackers, pudding, etc.  Resume your normal diet the day after surgery 5. It is common to experience some constipation if taking pain medication after surgery.  Increasing fluid intake and taking a stool softener will usually help or prevent this problem from occurring.  A mild laxative (Milk of Magnesia or Miralax) should be taken according to package directions if there are no bowel movements after 48 hours. 6. You may shower in 48 hours.  The surgical glue will flake off in 2-3 weeks.   7. ACTIVITIES:  No strenuous activity or heavy lifting for 1 week.   a. You may drive when you no longer are taking prescription pain medication, you can comfortably wear a seatbelt, and you can safely maneuver your car and apply brakes. b. RETURN TO WORK:  __________1-2 weeks_______________ Anthony King should see your doctor in the office for a follow-up appointment approximately three-four weeks after your surgery.    WHEN TO CALL YOUR DOCTOR: 1. Fever over 101.0 2. Nausea and/or vomiting. 3. Extreme swelling or  bruising. 4. Continued bleeding from incision. 5. Increased pain, redness, or drainage from the incision.  The clinic staff is available to answer your questions during regular business hours.  Please dont hesitate to call and ask to speak to one of the nurses for clinical concerns.  If you have a medical emergency, go to the nearest emergency room or call 911.  A surgeon from [facility] is always on call at the hospital.  For further questions, please visit [email or web address]     Post Anesthesia Home Care Instructions  Activity: Get plenty of rest for the remainder of the day. A responsible adult should stay with you for 24 hours following the procedure.  For the next 24 hours, DO NOT: -Drive a car -Paediatric nurse -Drink alcoholic beverages -Take any medication unless instructed by your physician -Make any legal decisions or sign important papers.  Meals: Start with liquid foods such as gelatin or soup. Progress to regular foods as tolerated. Avoid greasy, spicy, heavy foods. If nausea and/or vomiting occur, drink only clear liquids until the nausea and/or vomiting subsides. Call your physician if vomiting continues.  Special Instructions/Symptoms: Your throat may feel dry or sore from the anesthesia or the breathing tube placed in your throat during surgery. If this causes discomfort, gargle with warm salt water. The discomfort should disappear within 24 hours.

## 2013-08-29 NOTE — Transfer of Care (Signed)
Immediate Anesthesia Transfer of Care Note  Patient: Anthony King  Procedure(s) Performed: Procedure(s) with comments: EXCISION LEFT FLANK MASS/RIGHT EXCISION CHEST WALL MASS (N/A) - Left flank  Patient Location: PACU  Anesthesia Type:General  Level of Consciousness: sedated  Airway & Oxygen Therapy: Patient Spontanous Breathing and Patient connected to face mask oxygen  Post-op Assessment: Report given to PACU RN and Post -op Vital signs reviewed and stable  Post vital signs: Reviewed and stable  Complications: No apparent anesthesia complications

## 2013-08-29 NOTE — Anesthesia Preprocedure Evaluation (Signed)
Anesthesia Evaluation  Patient identified by MRN, date of birth, ID band Patient awake    Reviewed: Allergy & Precautions, H&P , NPO status , Patient's Chart, lab work & pertinent test results  Airway Mallampati: I TM Distance: >3 FB Neck ROM: Full    Dental   Pulmonary former smoker,          Cardiovascular     Neuro/Psych    GI/Hepatic GERD-  Medicated and Controlled,  Endo/Other    Renal/GU      Musculoskeletal   Abdominal   Peds  Hematology   Anesthesia Other Findings   Reproductive/Obstetrics                           Anesthesia Physical Anesthesia Plan  ASA: II  Anesthesia Plan: General   Post-op Pain Management:    Induction: Intravenous  Airway Management Planned: LMA  Additional Equipment:   Intra-op Plan:   Post-operative Plan: Extubation in OR  Informed Consent:   Plan Discussed with: CRNA and Surgeon  Anesthesia Plan Comments:         Anesthesia Quick Evaluation

## 2013-08-29 NOTE — Anesthesia Postprocedure Evaluation (Signed)
Anesthesia Post Note  Patient: Anthony King  Procedure(s) Performed: Procedure(s) (LRB): EXCISION LEFT FLANK MASS/RIGHT EXCISION CHEST WALL MASS (N/A)  Anesthesia type: general  Patient location: PACU  Post pain: Pain level controlled  Post assessment: Patient's Cardiovascular Status Stable  Last Vitals:  Filed Vitals:   08/29/13 1615  BP: 131/86  Pulse: 56  Temp:   Resp: 15    Post vital signs: Reviewed and stable  Level of consciousness: sedated  Complications: No apparent anesthesia complications

## 2013-08-29 NOTE — H&P (View-Only) (Signed)
Subjective:     Patient ID: Anthony King, male   DOB: [birth year], 63 y.o.   MRN: [id number]  HPI Patient is a 63 year old male upon whom I repaired a left inguinal hernia and removed a chest wall mass around 2 years ago. He returns with a painful left flank mass. This has been there for around 6 months to a year. He has noticed during the past few months it has started to enlarge. It is extremely sore and hurts every time he moves. He holds himself relatively stiff in order to decrease his discomfort.  He denies redness or drainage. 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  Review of Systems  All other systems reviewed and are negative.       Objective:   Physical Exam  Constitutional: He is oriented to person, place, and time. He appears well-developed and well-nourished. No distress.  HENT:  Head: Normocephalic and atraumatic.  Eyes: Conjunctivae are normal. Pupils are equal, round, and reactive to light. No scleral icterus.  Neck: Normal range of motion.  Cardiovascular: Normal rate, regular rhythm and intact distal pulses.   Pulmonary/Chest: Effort normal.   He exhibits mass and tenderness.    Left flank mass tender, in muscle wall.  Around 3 cm.  Relatively firm.  Right chest wall mass softer, mobile, tender, lateral to previous incision around 1 cm in diameter.    Abdominal: Soft. He exhibits no distension. There is no tenderness.  Musculoskeletal:  Holding himself sl stiff  Neurological: He is alert and oriented to person, place, and time.  Skin: Skin is warm and dry. No rash noted. He is not diaphoretic. No erythema. No pallor.  Psychiatric: He has a normal mood and affect. His behavior is normal. Thought content normal.       Assessment/Plan:     Left flank mass, 3 cm subfascial We'll plan excision  of this mass.  The mass is subfascial.  Summary difficult to excise in the office due to the size of the subfascial nature of it. I think the muscle is at higher risk for bleeding.  I reviewed the risks of surgery including bleeding, infection, damage to adjacent structures, continued pain, and the possibility of a malignant pathology. Patient understands and wishes to proceed.  I reviewed postoperative restrictions.  Mass of chest wall, right, 1 cm, subcutaneous. The patient's previous scar has an area lateral tear that is mobile and tender. I will plan to remove this as well.

## 2013-08-29 NOTE — Op Note (Signed)
PRE-OPERATIVE DIAGNOSIS: left flank subfascial mass 4 cm, right subcutaneous chest wall mass 1 cm  POST-OPERATIVE DIAGNOSIS:  Same  PROCEDURE:  Procedure(s): Excision of above masses  SURGEON:  Surgeon(s): Stark Klein, MD  ANESTHESIA:   local and general  DRAINS: none   LOCAL MEDICATIONS USED:  BUPIVICAINE  and LIDOCAINE   SPECIMEN:  Source of Specimen:  left flank mass, right chest wall mess  DISPOSITION OF SPECIMEN:  PATHOLOGY  COUNTS:  YES  DICTATION: .Dragon Dictation  PLAN OF CARE: Discharge to home after PACU  PATIENT DISPOSITION:  PACU - hemodynamically stable.  EBL:  Min  Findings: fatty masses in both locations.    Procedure:   The patient was identified in the holding area and taken to the operating room where he was placed supine on the operating room table. General LMA anesthesia was induced. A bump was placed under the left flank and his arm was padded and placed on the [employer] stand. The timeout was performed according to the surgical safety checklist. When all was correct we continued. The left flank was prepped and draped in sterile fashion. A transverse incision was made over the mass after infiltration local anesthesia. The mass was underneath the fascia and fully in the muscle layer. This was a fatty mass that was elevated and dissected out with the cautery. The mass was passed off the table. The incision was inspected for hemostasis. This was achieved with the cautery. The wound was then closed with 3-0 Vicryl deep dermal interrupted sutures and 4-0 Monocryl running subcuticular suture. The wound was then cleaned, dried, and dressed with benzoin, Steri-Strips, gauze, and Tegaderm.  The drapes were then removed. The right chest wall mass was prepped and draped out. The previous incision was opened with a #15 blade after infiltration with local anesthetic. The subcutaneous tissues were divided with the cautery. The firm area was elevated with Allis and it  appeared to be just another fatty mass. This was dissected out the cautery. This went down to the fascia. The mass was passed off the table. The cavity was inspected for hemostasis. The wound was closed, dried, and dressed in a similar fashion.  The patient was allowed to emerge from anesthesia. He was taken to the PACU in stable condition. Needle, sponge, and instrument counts were correct.

## 2013-08-29 NOTE — Interval H&P Note (Signed)
History and Physical Interval Note:  08/29/2013 2:38 PM  Anthony King  has presented today for surgery, with the diagnosis of left flank and right chest wall masses  The various methods of treatment have been discussed with the patient and family. After consideration of risks, benefits and other options for treatment, the patient has consented to  Procedure(s): EXCISION LEFT FLANK MASS/RIGHT EXCISION CHEST WALL MASS (N/A) as a surgical intervention .  The patient's history has been reviewed, patient examined, no change in status, stable for surgery.  I have reviewed the patient's chart and labs.  Questions were answered to the patient's satisfaction.     Anthony King

## 2013-08-30 ENCOUNTER — Encounter (HOSPITAL_BASED_OUTPATIENT_CLINIC_OR_DEPARTMENT_OTHER): Payer: Self-pay | Admitting: General Surgery

## 2013-09-11 ENCOUNTER — Ambulatory Visit (INDEPENDENT_AMBULATORY_CARE_PROVIDER_SITE_OTHER): Payer: BC Managed Care – PPO | Admitting: General Surgery

## 2013-09-11 ENCOUNTER — Encounter (INDEPENDENT_AMBULATORY_CARE_PROVIDER_SITE_OTHER): Payer: Self-pay | Admitting: General Surgery

## 2013-09-11 VITALS — BP 150/82 | HR 68 | Resp 18 | Ht 70.0 in | Wt 225.0 lb

## 2013-09-11 DIAGNOSIS — R222 Localized swelling, mass and lump, trunk: Secondary | ICD-10-CM

## 2013-09-11 DIAGNOSIS — R1900 Intra-abdominal and pelvic swelling, mass and lump, unspecified site: Secondary | ICD-10-CM

## 2013-09-14 NOTE — Progress Notes (Signed)
HISTORY: Pt is a 63 yo M who is s/p excision of right chest wall mass and left flank mass.  He is doing well overall.  He has returned to near normal activities.  He has noted over the last several days that the left flank area is a little swollen.  He also states that it is uncomfortable to lay on that side.  He denies wound drainage.  He is not having any pain.  His wife states that "he may have been overdoing it a bit."      EXAM: General:  Alert and oriented.   Incision:  Right chest wall healing well.  Left flank with small seroma.  Soft.  No erythema.     PATHOLOGY: Diagnosis 1. Soft tissue mass, biopsy, Left flank mass - FRAGMENTS OF BENIGN LIPOMA. 2. Soft tissue mass, biopsy, Right chest wall - BENIGN LIPOMA.   ASSESSMENT AND PLAN:   Mass of chest wall, right, 1 cm, subcutaneous. No issues.  Resolved.    Left flank mass, 3 cm subfascial Small seroma present.  No evidence of infection.  Advised him to take it easy a bit.   Will recheck if needed.  Pt will call if enlarging.        Anthony Height, MD Surgical Oncology, [facility]  [name], DO [name], DO

## 2013-09-14 NOTE — Assessment & Plan Note (Signed)
No issues.  Resolved.

## 2013-09-14 NOTE — Assessment & Plan Note (Signed)
Small seroma present.  No evidence of infection.  Advised him to take it easy a bit.   Will recheck if needed.  Pt will call if enlarging.

## 2013-09-22 ENCOUNTER — Encounter (INDEPENDENT_AMBULATORY_CARE_PROVIDER_SITE_OTHER): Payer: Self-pay | Admitting: General Surgery

## 2013-09-22 ENCOUNTER — Ambulatory Visit (INDEPENDENT_AMBULATORY_CARE_PROVIDER_SITE_OTHER): Payer: BC Managed Care – PPO | Admitting: General Surgery

## 2013-09-22 VITALS — BP 126/76 | HR 77 | Temp 98.4°F | Resp 16 | Ht 70.0 in | Wt 224.0 lb

## 2013-09-22 DIAGNOSIS — R1900 Intra-abdominal and pelvic swelling, mass and lump, unspecified site: Secondary | ICD-10-CM

## 2013-09-22 NOTE — Patient Instructions (Signed)
Follow up as needed

## 2013-09-22 NOTE — Progress Notes (Signed)
HISTORY: Pt is a 63 yo M who is s/p excision of right chest wall mass and left flank mass. He continues to do well, although he still has swelling on the left flank wound and still has soreness.    EXAM: General:  Alert and oriented.   Incision:  Right chest wall healing well.  Left flank with small seroma, around the same size as before.  .  Soft.  No erythema.     PATHOLOGY: Diagnosis 1. Soft tissue mass, biopsy, Left flank mass - FRAGMENTS OF BENIGN LIPOMA. 2. Soft tissue mass, biopsy, Right chest wall - BENIGN LIPOMA.   ASSESSMENT AND PLAN:   Left flank mass, 3 cm subfascial 20 ml seroma aspirated.  Follow up as needed       Milus Height, MD Surgical Oncology, [facility]  [name], DO [name], DO

## 2013-09-22 NOTE — Assessment & Plan Note (Signed)
20 ml seroma aspirated.  Follow up as needed

## 2014-02-28 ENCOUNTER — Encounter: Payer: Self-pay | Admitting: Gastroenterology

## 2014-09-24 ENCOUNTER — Encounter: Payer: Self-pay | Admitting: Gastroenterology

## 2015-01-17 ENCOUNTER — Ambulatory Visit (INDEPENDENT_AMBULATORY_CARE_PROVIDER_SITE_OTHER): Payer: BLUE CROSS/BLUE SHIELD

## 2015-01-17 ENCOUNTER — Ambulatory Visit (INDEPENDENT_AMBULATORY_CARE_PROVIDER_SITE_OTHER): Payer: BLUE CROSS/BLUE SHIELD | Admitting: Podiatry

## 2015-01-17 ENCOUNTER — Encounter: Payer: Self-pay | Admitting: Podiatry

## 2015-01-17 VITALS — BP 115/71 | HR 78 | Resp 16

## 2015-01-17 DIAGNOSIS — L603 Nail dystrophy: Secondary | ICD-10-CM | POA: Diagnosis not present

## 2015-01-17 DIAGNOSIS — M76821 Posterior tibial tendinitis, right leg: Secondary | ICD-10-CM | POA: Diagnosis not present

## 2015-01-17 DIAGNOSIS — M8430XA Stress fracture, unspecified site, initial encounter for fracture: Secondary | ICD-10-CM

## 2015-01-17 DIAGNOSIS — L600 Ingrowing nail: Secondary | ICD-10-CM

## 2015-01-17 DIAGNOSIS — M722 Plantar fascial fibromatosis: Secondary | ICD-10-CM | POA: Diagnosis not present

## 2015-01-17 DIAGNOSIS — M205X1 Other deformities of toe(s) (acquired), right foot: Secondary | ICD-10-CM

## 2015-01-17 MED ORDER — NEOMYCIN-POLYMYXIN-HC 3.5-10000-1 OT SOLN: OTIC | Status: DC

## 2015-01-17 NOTE — Progress Notes (Signed)
He presents today with a chief complaint of pain to right foot along the forefoot and dorsal lateral foot as well as the plantar medial aspect of his right heel and right ankle. He's also complaining primarily of a painful nail hallux left. He states these corners always get incurvated and I can never get them out. If like an ingrown nail. The nail is thick and it's painful. He states that this is going on for quite some time and he would like to consider having the nail removed. Regarding the right foot he states that his only been swelling for the past few days and denies any trauma. He states that this started out particularly with pain in the mornings along the heel area and then progressed to the medial ankle.  Objective: Vital signs are stable he is alert and oriented 3. Pulses are strongly palpable bilateral. Neurologic sensorium is intact per Semmes-Weinstein monofilament. Deep tendon reflected are intact bilateral. Muscle strength +5 over 5 dorsiflexion plantar flexors and inverters and everters all into the musculature is intact. Orthopedic evaluation demonstrates all joints distal to the ankle range of motion without crepitation. He has pain on palpation medial calcaneal tubercle of the right heel and pain on palpation of the posterior tibial tendon as it courses beneath the medial malleolus extending to the navicular tuberosity. He has no pain on medial lateral compression of the calcaneus. Radiographs do not demonstrate any type of osseus abnormalities other than plantar distally oriented calcaneal heel spur and the soft tissue increase in density at the plantar fascial calcaneal insertion site. Cutaneous evaluation demonstrates supple well-hydrated uterus with exception of the hallux nail plate left which does demonstrate dystrophy and sharp incurvated nail margins without erythema and pain.  Assessment: Plantar fasciitis of the right heel with compensatory posterior tibial tendinitis right.  Painful ingrown nail with nail dystrophy hallux left.  Plan: Discussed etiology pathology conservative versus surgical therapies. At this point I injected the right heel today and placed him in a plantar fascial brace to be followed by a night splint. At this point no oral medication was given because of the matrixectomy that was to be performed. We did discussed appropriate shoe gear stretching exercises ice therapy and shoe gear modifications regarding this right foot. Will also performed a chemical matrixectomy today to the total nail plate hallux left. The nail plate was removed and phenol was applied after local anesthesia was achieved. Silvadene cream Telfa pad and a dry sterile compressive dressing was applied he was given both oral and written home-going instructions for the soaking in the care of his toe. He was also provided with a prescription for Cortisporin Otic which he will apply twice daily after soaking. I will follow-up with him in approximately 1 week for reevaluation.

## 2015-01-17 NOTE — Patient Instructions (Signed)

## 2015-01-28 ENCOUNTER — Ambulatory Visit: Payer: Self-pay | Admitting: Podiatry

## 2015-01-29 ENCOUNTER — Encounter: Payer: Self-pay | Admitting: Podiatry

## 2015-01-29 ENCOUNTER — Ambulatory Visit (INDEPENDENT_AMBULATORY_CARE_PROVIDER_SITE_OTHER): Payer: BLUE CROSS/BLUE SHIELD | Admitting: Podiatry

## 2015-01-29 DIAGNOSIS — M722 Plantar fascial fibromatosis: Secondary | ICD-10-CM

## 2015-01-29 DIAGNOSIS — L600 Ingrowing nail: Secondary | ICD-10-CM

## 2015-01-29 NOTE — Patient Instructions (Signed)

## 2015-01-29 NOTE — Progress Notes (Signed)
This patient presents today for follow-up matrixectomy total nail hallux left. He continues to soak twice daily and apply Cortisporin otic as directed. Relating no complaints.  Objective: Vital signs are stable. Secondly site appears to be healing well without erythema or drainage purulence or odor.  Assessment: Well-healing surgical matrixectomy without complications.  Plan: Currently we will discontinue the use of Betadine soaks and start with Epsom salts and water twice daily. Continue the use of Cortisporin Otic and covered during the day leaving it open at night time. He will continue to soak the toe until completely well. He will continue to watch the toe for signs and symptoms of infection should any arise we will be notified immediately. I will follow up with him in 1 month for re-evaluation plantar fasciitis/tendonitis on the right foot.

## 2015-02-07 ENCOUNTER — Ambulatory Visit (INDEPENDENT_AMBULATORY_CARE_PROVIDER_SITE_OTHER): Payer: BLUE CROSS/BLUE SHIELD | Admitting: Podiatry

## 2015-02-07 ENCOUNTER — Encounter: Payer: Self-pay | Admitting: Podiatry

## 2015-02-07 VITALS — BP 125/76 | HR 65 | Resp 16

## 2015-02-07 DIAGNOSIS — L03032 Cellulitis of left toe: Secondary | ICD-10-CM | POA: Diagnosis not present

## 2015-02-07 DIAGNOSIS — L03012 Cellulitis of left finger: Secondary | ICD-10-CM

## 2015-02-07 MED ORDER — CEPHALEXIN 500 MG PO CAPS: 500.0000 mg | ORAL_CAPSULE | Freq: Three times a day (TID) | ORAL | Status: DC

## 2015-02-07 NOTE — Patient Instructions (Signed)
Betadine Soak Instructions  Purchase an 8 oz. bottle of BETADINE solution (Povidone)  THE DAY AFTER THE PROCEDURE  Place 1 tablespoon of betadine solution in a quart of warm tap water.  Submerge your foot or feet with outer bandage intact for the initial soak; this will allow the bandage to become moist and wet for easy lift off.  Once you remove your bandage, continue to soak in the solution for 20 minutes.  This soak should be done twice a day.  Next, remove your foot or feet from solution, blot dry the affected area and cover.  You may use a band aid large enough to cover the area or use gauze and tape.  Apply other medications to the area as directed by the doctor such as cortisporin otic solution (ear drops) or neosporin.  IF YOUR SKIN BECOMES IRRITATED WHILE USING THESE INSTRUCTIONS, IT IS OKAY TO SWITCH TO EPSOM SALTS AND WATER OR WHITE VINEGAR AND WATER.     Epsom Salt Soak Instructions    Place 1/4 cup of epsom salts in a quart of warm tap water.  Soak your foot or feet in the solution for 20 minutes twice a day until you notice the area has dried and a scab has formed. Continue to apply other medications to the area as directed by the doctor such as polysporin, neosporin or cortisporin drops.  IF YOUR SKIN BECOMES IRRITATED WHILE USING THESE INSTRUCTIONS, IT IS OKAY TO SWITCH TO  WHITE VINEGAR AND WATER. Or you may use antibacterial soap and water to keep the toe clean  Monitor for any signs/symptoms of infection. Call the office immediately if any occur or go directly to the emergency room. Call with any questions/concerns.

## 2015-02-07 NOTE — Progress Notes (Signed)
This patient presents today for follow-up matrixectomy. He continues to soak twice daily and apply Cortisporin otic as directed. Relating increased drainage, redness, swelling and pain.  Objective: Vital signs are stable. Secondly site appears to be healing with some erythema and drainage.  Assessment: Slow healing surgical matrixectomy total nail hallux left  Plan: Currently we will discontinue the use of Betadine soaks and start with Epsom salts and water twice daily. Continue the use of Cortisporin Otic and covered during the day leaving it open at night time. He will continue to soak the toe until completely well. Prescribed cephalexin 500 mg TID x 10 days. Follow-up in 2 weeks. He will continue to watch the toe for increased signs and symptoms of infection should any arise we will be notified immediately.

## 2015-02-14 ENCOUNTER — Ambulatory Visit: Payer: BLUE CROSS/BLUE SHIELD | Admitting: Podiatry

## 2015-02-28 ENCOUNTER — Ambulatory Visit: Payer: BLUE CROSS/BLUE SHIELD | Admitting: Podiatry

## 2015-06-05 ENCOUNTER — Encounter: Payer: Self-pay | Admitting: Internal Medicine

## 2015-12-18 ENCOUNTER — Ambulatory Visit (INDEPENDENT_AMBULATORY_CARE_PROVIDER_SITE_OTHER): Payer: PPO

## 2015-12-18 ENCOUNTER — Telehealth: Payer: Self-pay | Admitting: *Deleted

## 2015-12-18 ENCOUNTER — Encounter: Payer: Self-pay | Admitting: Podiatry

## 2015-12-18 ENCOUNTER — Ambulatory Visit (INDEPENDENT_AMBULATORY_CARE_PROVIDER_SITE_OTHER): Payer: PPO | Admitting: Podiatry

## 2015-12-18 VITALS — BP 144/86 | HR 87 | Resp 13 | Ht 70.0 in | Wt 215.0 lb

## 2015-12-18 DIAGNOSIS — M779 Enthesopathy, unspecified: Secondary | ICD-10-CM | POA: Diagnosis not present

## 2015-12-18 DIAGNOSIS — M7989 Other specified soft tissue disorders: Secondary | ICD-10-CM

## 2015-12-18 DIAGNOSIS — M722 Plantar fascial fibromatosis: Secondary | ICD-10-CM | POA: Diagnosis not present

## 2015-12-18 MED ORDER — TRIAMCINOLONE ACETONIDE 10 MG/ML IJ SUSP
10.0000 mg | Freq: Once | INTRAMUSCULAR | Status: AC
  Administered 2015-12-18: 10 mg

## 2015-12-18 NOTE — Progress Notes (Signed)
Subjective:     Patient ID: Anthony King, male   DOB: 04/01/1951, 65 y.o.   MRN: [id number]  HPI patient presents stating I'm having pain in my ankle right that's been present for several months and I've had swelling of my right foot for approximately 6 months   Review of Systems  All other systems reviewed and are negative.      Objective:   Physical Exam  Constitutional: He is oriented to person, place, and time.  Cardiovascular: Intact distal pulses.   Musculoskeletal: Normal range of motion.  Neurological: He is oriented to person, place, and time.  Skin: Skin is warm.  Nursing note and vitals reviewed.  neurovascular status intact muscle strength adequate range of motion within normal limits with patient found to have pain in the medial side of the right ankle around the medial malleolus. Patient has slight distal pain and did have edema in the right forefoot +1 pitting with negative Homans sign noted upon evaluation     Assessment:     Inflammatory capsulitis tendinitis of the right medial ankle with edema noted of the right foot localized in nature with no indications of clot    Plan:     H&P x-ray reviewed with the right foot and ankle and today I did careful injection of the right ankle 3 mg Kenalog 5 mill grams Xylocaine and applied fascial brace with instructions and discussed compression therapy for the long-term. Reappoint to recheck again in the next several weeks  X-ray report indicated some arthritis around the medial malleolus plantar but no indications of other pathology

## 2015-12-18 NOTE — Progress Notes (Signed)
   Subjective:    Patient ID: Anthony King, male    DOB: 16-Oct-1950, 65 y.o.   MRN: [id number]  HPI    Review of Systems  All other systems reviewed and are negative.      Objective:   Physical Exam        Assessment & Plan:

## 2015-12-25 ENCOUNTER — Encounter: Payer: Self-pay | Admitting: Podiatry

## 2015-12-25 ENCOUNTER — Ambulatory Visit (INDEPENDENT_AMBULATORY_CARE_PROVIDER_SITE_OTHER): Payer: PPO | Admitting: Podiatry

## 2015-12-25 DIAGNOSIS — M779 Enthesopathy, unspecified: Secondary | ICD-10-CM

## 2015-12-25 DIAGNOSIS — M7989 Other specified soft tissue disorders: Secondary | ICD-10-CM

## 2015-12-25 NOTE — Progress Notes (Signed)
Subjective:     Patient ID: Anthony King, male   DOB: [birth year], 65 y.o.   MRN: [id number]  HPI patient presents stating that the right foot is doing much better with minimal discomfort and swelling   Review of Systems     Objective:   Physical Exam Neurovascular status intact muscle strength adequate range of motion within normal limits with patient found to have significant reduction of swelling and pain in the dorsum of the right foot    Assessment:     Doing well post injection right with mild edema around the right ankle    Plan:     Evaluated condition and do not recommend further aggressive treatment but I did go ahead and dispensed a anklet with instructions on usage

## 2016-03-24 ENCOUNTER — Telehealth: Payer: Self-pay | Admitting: Family Medicine

## 2016-03-24 NOTE — Telephone Encounter (Signed)
Please advise      KP 

## 2016-03-24 NOTE — Telephone Encounter (Signed)
yes

## 2016-03-24 NOTE — Telephone Encounter (Signed)
Wife called to see if Dr. Carollee Herter will consider re- establishing patient. Patient has not been seen since 2014. Plse adv

## 2016-03-25 ENCOUNTER — Encounter: Payer: Self-pay | Admitting: Podiatry

## 2016-03-25 ENCOUNTER — Ambulatory Visit (INDEPENDENT_AMBULATORY_CARE_PROVIDER_SITE_OTHER): Payer: PPO | Admitting: Podiatry

## 2016-03-25 ENCOUNTER — Ambulatory Visit: Payer: Self-pay

## 2016-03-25 ENCOUNTER — Ambulatory Visit (HOSPITAL_COMMUNITY)
Admission: RE | Admit: 2016-03-25 | Discharge: 2016-03-25 | Disposition: A | Discharge status: Home / Self Care | Payer: PPO | Source: Ambulatory Visit | Attending: Vascular Surgery | Admitting: Vascular Surgery

## 2016-03-25 DIAGNOSIS — M7989 Other specified soft tissue disorders: Secondary | ICD-10-CM | POA: Diagnosis not present

## 2016-03-25 DIAGNOSIS — M79672 Pain in left foot: Secondary | ICD-10-CM

## 2016-03-25 DIAGNOSIS — M79605 Pain in left leg: Secondary | ICD-10-CM | POA: Diagnosis present

## 2016-03-25 NOTE — Telephone Encounter (Signed)
Left message for patient informing him that Dr. Etter Sjogren has agreed to reestablish him and he can call and schedule

## 2016-03-26 NOTE — Progress Notes (Signed)
Subjective:     Patient ID: Anthony King, male   DOB: [birth year], 65 y.o.   MRN: [id number]  HPI patient states the right foot is doing quite a bit better but the left foot and ankle has started to swell and he is worried about blood clot   Review of Systems     Objective:   Physical Exam Neurovascular status intact with mild discomfort into the posterior left calf muscle with no systemic signs of clotting and no indications of pulmonary embolism. He has +2 edema in the left foot and into the ankle and it is mildly tender when pressed and the right foot is doing better    Assessment:     Cannot rule out the possibility of venous clotting left due to spontaneous edema    Plan:     Reviewed condition and we are sending him for a Doppler and we will see the results and decide what we can do to help him. He will be scheduled by Mate[facility] Flow as soon as possible for Doppler left but at this point is not appear to be an acute emergency but does need to be evaluated

## 2016-03-30 ENCOUNTER — Telehealth: Payer: Self-pay | Admitting: *Deleted

## 2016-03-30 NOTE — Telephone Encounter (Signed)
"  Yeah, I been expecting a call from there for a couple of days.  Ain't nobody called and told me nothing.  I don't know what's going on.  I guess calling on a Friday is not a good idea but I still would like to know something.  Thank you."

## 2016-03-30 NOTE — Telephone Encounter (Signed)
Entered in error

## 2016-03-30 NOTE — Telephone Encounter (Addendum)
Pt left name and phone number.  I spoke with pt's wife, Olegario Shearer she states pt is in a lot of pain and has a lot of swelling, would like the results of his ultrasound. Left message informing pt Dr. Paulla Dolly recommended a soft medicated cast, that could pt applied by a nurse, so he would be scheduled on the Nurses Schedule and late on Dr. Mellody Drown to check progress. 04/01/2016-Pt called left name and phone number. I spoke with pt and he said he wanted to know about the soft cast I called about. I told pt I had wanted to make sure he got on the Nurses Schedule as soon as possible when I called 03/30/2016. Pt stated he wanted to know what it was, I told him it was a medicated soft cast worn 3-5 days to decrease swelling and pain, that I had hoped to get him in one, then into see Dr. Paulla Dolly once cast was removed to check progress. Pt states he had wanted to know his test results, I told him Dr. Paulla Dolly had reviewed as negative for DVT and I was going to tell him once he called. Pt states he would like to schedule for the cast, I transferred to schedulers to be put on the Nurses Schedule.

## 2016-03-30 NOTE — Telephone Encounter (Signed)
Could come in for unna boot

## 2016-03-31 NOTE — Telephone Encounter (Signed)
Called and left message for patient to call and make appt for annual exam

## 2016-04-02 ENCOUNTER — Ambulatory Visit (INDEPENDENT_AMBULATORY_CARE_PROVIDER_SITE_OTHER): Payer: PPO | Admitting: Podiatry

## 2016-04-02 ENCOUNTER — Ambulatory Visit (INDEPENDENT_AMBULATORY_CARE_PROVIDER_SITE_OTHER): Payer: PPO

## 2016-04-02 DIAGNOSIS — M779 Enthesopathy, unspecified: Secondary | ICD-10-CM | POA: Diagnosis not present

## 2016-04-02 DIAGNOSIS — M79672 Pain in left foot: Secondary | ICD-10-CM | POA: Diagnosis not present

## 2016-04-02 DIAGNOSIS — M7989 Other specified soft tissue disorders: Secondary | ICD-10-CM

## 2016-04-02 NOTE — Telephone Encounter (Signed)
This was a nurse related call.  [name] took care of it on 03/30/2016.

## 2016-04-03 ENCOUNTER — Telehealth: Payer: Self-pay

## 2016-04-03 NOTE — Telephone Encounter (Signed)
Pt needed an appt. For an annual exam PC

## 2016-04-03 NOTE — Progress Notes (Signed)
Subjective:     Patient ID: Anthony King, male   DOB: 27-Dec-1950, 65 y.o.   MRN: [id number]  HPI patient presents stating that the foot and ankle are still swelling and he's develop quite a bit of discomfort in the forefoot and he did have a negative Doppler   Review of Systems     Objective:   Physical Exam Neurovascular status unchanged with negative Homans sign noted and +2 pitting edema of the left dorsum foot and into the ankle with most the pain centered around the metatarsal shafts    Assessment:     Changes consistent with probable vein process without clot with also the possibility for stress fracture    Plan:     H&P x-rays taken reviewed and today I applied Unna boot to try to take pressure off the foot along with elevation compression and ankle stocking usage when Unna boot is taken off. I did explain that he may need to see a vein doctor but we will see how the results of this  X-rays taken negative for signs of fracture

## 2016-04-09 ENCOUNTER — Ambulatory Visit (INDEPENDENT_AMBULATORY_CARE_PROVIDER_SITE_OTHER): Payer: PPO | Admitting: Podiatry

## 2016-04-09 DIAGNOSIS — M7989 Other specified soft tissue disorders: Secondary | ICD-10-CM

## 2016-04-09 DIAGNOSIS — M779 Enthesopathy, unspecified: Secondary | ICD-10-CM

## 2016-04-09 NOTE — Progress Notes (Signed)
Subjective:     Patient ID: Anthony King, male   DOB: Aug 08, 1950, 65 y.o.   MRN: [id number]  HPI patient states I seem to be improving but I still gets some swelling if I use it too much   Review of Systems     Objective:   Physical Exam Neurovascular status intact with reduction of edema in the left and right foot with mild discomfort still noted but quite a bit of improvement from previous    Assessment:     Doing well from edematous foot condition left over right    Plan:     Reviewed continued compression and dispensed another anklet and elevation and reappoint to recheck as needed

## 2016-06-11 ENCOUNTER — Encounter: Payer: Self-pay | Admitting: Family Medicine

## 2016-06-11 ENCOUNTER — Ambulatory Visit (INDEPENDENT_AMBULATORY_CARE_PROVIDER_SITE_OTHER): Payer: PPO | Admitting: Family Medicine

## 2016-06-11 VITALS — BP 148/72 | HR 85 | Temp 98.2°F | Resp 16 | Ht 72.0 in | Wt 221.2 lb

## 2016-06-11 DIAGNOSIS — R0602 Shortness of breath: Secondary | ICD-10-CM

## 2016-06-11 DIAGNOSIS — Z0000 Encounter for general adult medical examination without abnormal findings: Secondary | ICD-10-CM

## 2016-06-11 DIAGNOSIS — Z23 Encounter for immunization: Secondary | ICD-10-CM

## 2016-06-11 DIAGNOSIS — R079 Chest pain, unspecified: Secondary | ICD-10-CM | POA: Diagnosis not present

## 2016-06-11 DIAGNOSIS — Z86010 Personal history of colonic polyps: Secondary | ICD-10-CM

## 2016-06-11 LAB — POCT URINALYSIS DIPSTICK
BILIRUBIN UA: NEGATIVE
Blood, UA: NEGATIVE
Glucose, UA: NEGATIVE
Ketones, UA: NEGATIVE
LEUKOCYTES UA: NEGATIVE
NITRITE UA: NEGATIVE
PH UA: 6
Protein, UA: NEGATIVE
Spec Grav, UA: 1.025
Urobilinogen, UA: 0.2

## 2016-06-11 NOTE — Progress Notes (Signed)
Pre visit review using our clinic review tool, if applicable. No additional management support is needed unless otherwise documented below in the visit note. 

## 2016-06-11 NOTE — Patient Instructions (Signed)
Preventive Care 65 Years and Older, Male Preventive care refers to lifestyle choices and visits with your health care provider that can promote health and wellness. What does preventive care include?  A yearly physical exam. This is also called an annual well check.  Dental exams once or twice a year.  Routine eye exams. Ask your health care provider how often you should have your eyes checked.  Personal lifestyle choices, including:  Daily care of your teeth and gums.  Regular physical activity.  Eating a healthy diet.  Avoiding tobacco and drug use.  Limiting alcohol use.  Practicing safe sex.  Taking low doses of aspirin every day.  Taking vitamin and mineral supplements as recommended by your health care provider. What happens during an annual well check? The services and screenings done by your health care provider during your annual well check will depend on your age, overall health, lifestyle risk factors, and family history of disease. Counseling  Your health care provider may ask you questions about your:  Alcohol use.  Tobacco use.  Drug use.  Emotional well-being.  Home and relationship well-being.  Sexual activity.  Eating habits.  History of falls.  Memory and ability to understand (cognition).  Work and work environment. Screening  You may have the following tests or measurements:  Height, weight, and BMI.  Blood pressure.  Lipid and cholesterol levels. These may be checked every 5 years, or more frequently if you are over 50 years old.  Skin check.  Lung cancer screening. You may have this screening every year starting at age 55 if you have a 30-pack-year history of smoking and currently smoke or have quit within the past 15 years.  Fecal occult blood test (FOBT) of the stool. You may have this test every year starting at age 50.  Flexible sigmoidoscopy or colonoscopy. You may have a sigmoidoscopy every 5 years or a colonoscopy every 10  years starting at age 50.  Prostate cancer screening. Recommendations will vary depending on your family history and other risks.  Hepatitis C blood test.  Hepatitis B blood test.  Sexually transmitted disease (STD) testing.  Diabetes screening. This is done by checking your blood sugar (glucose) after you have not eaten for a while (fasting). You may have this done every 1-3 years.  Abdominal aortic aneurysm (AAA) screening. You may need this if you are a current or former smoker.  Osteoporosis. You may be screened starting at age 70 if you are at high risk. Talk with your health care provider about your test results, treatment options, and if necessary, the need for more tests. Vaccines  Your health care provider may recommend certain vaccines, such as:  Influenza vaccine. This is recommended every year.  Tetanus, diphtheria, and acellular pertussis (Tdap, Td) vaccine. You may need a Td booster every 10 years.  Varicella vaccine. You may need this if you have not been vaccinated.  Zoster vaccine. You may need this after age 60.  Measles, mumps, and rubella (MMR) vaccine. You may need at least one dose of MMR if you were born in 1957 or later. You may also need a second dose.  Pneumococcal 13-valent conjugate (PCV13) vaccine. One dose is recommended after age 65.  Pneumococcal polysaccharide (PPSV23) vaccine. One dose is recommended after age 65.  Meningococcal vaccine. You may need this if you have certain conditions.  Hepatitis A vaccine. You may need this if you have certain conditions or if you travel or work in places where   you may be exposed to hepatitis A.  Hepatitis B vaccine. You may need this if you have certain conditions or if you travel or work in places where you may be exposed to hepatitis B.  Haemophilus influenzae type b (Hib) vaccine. You may need this if you have certain risk factors. Talk to your health care provider about which screenings and vaccines you  need and how often you need them. This information is not intended to replace advice given to you by your health care provider. Make sure you discuss any questions you have with your health care provider. Document Released: 07/12/2015 Document Revised: 03/04/2016 Document Reviewed: 04/16/2015 Elsevier Interactive Patient Education  2017 [employer].

## 2016-06-11 NOTE — Progress Notes (Signed)
Subjective:   Anthony King is a 65 y.o. male who presents for a Welcome to Medicare exam.  Pt c/o hot flusing Review of Systems:  Review of Systems  Constitutional: Negative for activity change, appetite change and fatigue.  HENT: Negative for hearing loss, congestion, tinnitus and ear discharge.   Eyes: Negative for visual disturbance (see optho q1y -- vision corrected to 20/20 with glasses).  Respiratory: Negative for cough, chest tightness and shortness of breath.   Cardiovascular: Negative for chest pain, palpitations and leg swelling.  Gastrointestinal: Negative for abdominal pain, diarrhea, constipation and abdominal distention.  Genitourinary: Negative for urgency, frequency, decreased urine volume and difficulty urinating.  Musculoskeletal: Negative for back pain, arthralgias and gait problem.  Skin: Negative for color change, pallor and rash.  Neurological: Negative for dizziness, light-headedness, numbness and headaches.  Hematological: Negative for adenopathy. Does not bruise/bleed easily.  Psychiatric/Behavioral: Negative for suicidal ideas, confusion, sleep disturbance, self-injury, dysphoric mood, decreased concentration and agitation.  Pt is able to read and write and can do all ADLs No risk for falling No abuse/ violence in home         Objective:    Today's Vitals   06/11/16 1508  BP: (!) 148/72  Pulse: 85  Resp: 16  Temp: 98.2 F (36.8 C)  TempSrc: Oral  SpO2: 98%  Weight: 221 lb 3.2 oz (100.3 kg)  Height: 6' (1.829 m)   Body mass index is 30 kg/m.  Medications Outpatient Encounter Prescriptions as of 06/11/2016  Medication Sig  . Multiple Vitamins-Minerals (MULTIVITAMIN PO) Take 1 tablet by mouth daily.  . ranitidine (ZANTAC) 150 MG tablet Take 150 mg by mouth daily.  . vitamin B-12 (CYANOCOBALAMIN) 1000 MCG tablet Take 2,000 mcg by mouth daily.   Marland Kitchen neomycin-polymyxin-hydrocortisone (CORTISPORIN) otic solution Apply one to two drops to toe  after soaking twice daily. (Patient not taking: Reported on 06/11/2016)  . Omega-3 Fatty Acids (FISH OIL PO) Take by mouth. Reported on 12/18/2015  . omeprazole (PRILOSEC) 20 MG capsule Take 20 mg by mouth daily before breakfast.   No facility-administered encounter medications on file as of 06/11/2016.      History: Past Medical History:  Diagnosis Date  . Arthritis 07-17-11   hands, hips, knees  . Basal cell carcinoma    nose  . Chronic kidney disease 07-17-11   past kidney stone x1  . Colon polyps    ADENOMATOUS AND HYPERPLASTIC POLYPS  . Diverticulosis   . Gastric ulcer   . GERD (gastroesophageal reflux disease)   . Hemorrhoids   . Hyperlipidemia   . Wears glasses    Past Surgical History:  Procedure Laterality Date  . APPENDECTOMY    . BASAL CELL CARCINOMA EXCISION     nose  . COLONOSCOPY    . fatty tumors  07-17-11   excised x4- 1 -neck, 1 arm, 2 chest  . INGUINAL HERNIA REPAIR  07/23/2011   Procedure: LAPAROSCOPIC INGUINAL HERNIA;  Surgeon: Stark Klein, MD;  Location: [facility];  Service: General;  Laterality: Left;  Marland Kitchen MASS EXCISION N/A 08/29/2013   Procedure: EXCISION LEFT FLANK MASS/RIGHT EXCISION CHEST WALL MASS;  Surgeon: Stark Klein, MD;  Location: [facility];  Service: General;  Laterality: N/A;  Left flank    Family History  Problem Relation Age of Onset  . Stomach cancer Father   . Stomach cancer Sister     Passed away 06/15/23  . Colon cancer Sister     mets from stomach  .  Arthritis Mother   . Breast cancer Mother   . Arthritis Paternal Grandmother   . Arthritis Maternal Grandmother   . Esophageal cancer Neg Hx    Social History   Occupational History  . Not on file.   Social History Main Topics  . Smoking status: Former Smoker    Quit date: 07/02/1991  . Smokeless tobacco: Never Used  . Alcohol use No  . Drug use: No  . Sexual activity: Yes   Tobacco Counseling Counseling given: Not Answered   Immunizations and Health  Maintenance Immunization History  Administered Date(s) Administered  . Pneumococcal Polysaccharide-23 06/11/2016  . Tdap 06/29/2009   Health Maintenance Due  Topic Date Due  . Hepatitis C Screening  08/26/1950  . HIV Screening  11/17/1965  . ZOSTAVAX  11/18/2010  . COLONOSCOPY  11/03/2014    Activities of Daily Living In your present state of health, do you have any difficulty performing the following activities: 06/11/2016  Hearing? N  Vision? N  Difficulty concentrating or making decisions? N  Walking or climbing stairs? N  Dressing or bathing? N  Doing errands, shopping? N  Some recent data might be hidden    Physical Exam   BP (!) 148/72 (BP Location: Right Arm, Patient Position: Sitting, Cuff Size: Normal)   Pulse 85   Temp 98.2 F (36.8 C) (Oral)   Resp 16   Ht 6' (1.829 m)   Wt 221 lb 3.2 oz (100.3 kg)   SpO2 98%   BMI 30.00 kg/m  General appearance: alert, cooperative, appears stated age and no distress Head: Normocephalic, without obvious abnormality, atraumatic Eyes: conjunctivae/corneas clear. PERRL, EOM's intact. Fundi benign. Ears: normal TM's and external ear canals both ears Nose: Nares normal. Septum midline. Mucosa normal. No drainage or sinus tenderness. Throat: lips, mucosa, and tongue normal; teeth and gums normal Neck: no adenopathy, no carotid bruit, no JVD, supple, symmetrical, trachea midline and thyroid not enlarged, symmetric, no tenderness/mass/nodules Back: symmetric, no curvature. ROM normal. No CVA tenderness. Lungs: clear to auscultation bilaterally Chest wall: no tenderness Heart: regular rate and rhythm, S1, S2 normal, no murmur, click, rub or gallop Abdomen: soft, non-tender; bowel sounds normal; no masses,  no organomegaly Male genitalia: normal, penis: no lesions or discharge. testes: no masses or tenderness. no hernias Rectal: normal tone, normal prostate, no masses or tenderness Extremities: extremities normal, atraumatic, no  cyanosis or edema Pulses: 2+ and symmetric Skin: Skin color, texture, turgor normal. No rashes or lesions Lymph nodes: Cervical, supraclavicular, and axillary nodes normal. Neurologic: Alert and oriented X 3, normal strength and tone. Normal symmetric reflexes. Normal coordination and gait Advanced Directives: Does Patient Have a Medical Advance Directive?: No Would patient like information on creating a medical advance directive?: No - Patient declined    Assessment:    This is a routine wellness  examination for this patient .    Vision/Hearing screen Hearing Screening Comments: Whisper normal Vision Screening Comments: Vision---good-- see opth q1-2 years  Dietary issues and exercise activities discussed:  Current Exercise Habits: Structured exercise class, Type of exercise: walking, Time (Minutes): 60, Intensity: Moderate  Goals    None      Depression Screen PHQ 2/9 Scores 06/11/2016  PHQ - 2 Score 0     Fall Risk Fall Risk  06/11/2016  Falls in the past year? No    Cognitive Function MMSE - Mini Mental State Exam 06/11/2016  Orientation to time 5  Orientation to Place 5  Registration 3  Attention/ Calculation 5  Recall 3  Language- name 2 objects 2  Language- repeat 1  Language- follow 3 step command 3  Language- read & follow direction 1  Write a sentence 1  Copy design 1  Total score 30        Patient Care Team: Ann Held, DO as PCP - General (Family Medicine)    ekg--no changes from previous Plan:    see AVS  During the course of the visit,Oshen was educated and counseled about the following appropriate screening and preventive services:   Vaccines to include Pneumoccal, Influenza, Hepatitis B, Td, Zostavax, HCV  Electrocardiogram  Cardiovascular Disease  Colorectal cancer screening  Diabetes screening  Glaucoma screening  Nutrition counseling  Prostate cancer screening  Smoking cessation counseling  His current  medications and allergies were reviewed and needed refills of his chronic medications were ordered. The plan for yearly health maintenance was discussed all orders and referrals were made as appropriate.  Patient Instructions (the written plan) was given to the patient.  . 1. Preventative health care Check labs  2. History of colonic polyps   - Ambulatory referral to Gastroenterology  3. Welcome to Medicare preventive visit See orders - EKG 12-Lead - Comprehensive metabolic panel - PSA - POCT urinalysis dipstick - CBC with Differential/Platelet - Lipid panel  4. Encounter for Medicare annual wellness exam   5. SOB (shortness of breath)  - ECHOCARDIOGRAM COMPLETE; Future - Myocardial Perfusion Imaging; Future  6. Chest pain, unspecified type  - Myocardial Perfusion Imaging; Future  [place], DO 06/14/2016

## 2016-06-12 ENCOUNTER — Encounter: Payer: Self-pay | Admitting: Gastroenterology

## 2016-06-12 LAB — COMPREHENSIVE METABOLIC PANEL
ALT: 21 U/L (ref 0–53)
AST: 22 U/L (ref 0–37)
Albumin: 4.1 g/dL (ref 3.5–5.2)
Alkaline Phosphatase: 84 U/L (ref 39–117)
BUN: 17 mg/dL (ref 6–23)
CHLORIDE: 106 meq/L (ref 96–112)
CO2: 28 meq/L (ref 19–32)
CREATININE: 1.31 mg/dL (ref 0.40–1.50)
Calcium: 9.6 mg/dL (ref 8.4–10.5)
GFR: 58.26 mL/min — ABNORMAL LOW (ref >60.00)
GLUCOSE: 104 mg/dL — AB (ref 70–99)
POTASSIUM: 4.3 meq/L (ref 3.5–5.1)
SODIUM: 141 meq/L (ref 135–145)
Total Bilirubin: 0.9 mg/dL (ref 0.2–1.2)
Total Protein: 6.2 g/dL (ref 6.0–8.3)

## 2016-06-12 LAB — CBC WITH DIFFERENTIAL/PLATELET
BASOS PCT: 0.2 % (ref 0.0–3.0)
Basophils Absolute: 0 10*3/uL (ref 0.0–0.1)
EOS PCT: 2 % (ref 0.0–5.0)
Eosinophils Absolute: 0.2 10*3/uL (ref 0.0–0.7)
HCT: 44.8 % (ref 39.0–52.0)
Hemoglobin: 15 g/dL (ref 13.0–17.0)
LYMPHS ABS: 2.2 10*3/uL (ref 0.7–4.0)
Lymphocytes Relative: 27.3 % (ref 12.0–46.0)
MCHC: 33.5 g/dL (ref 30.0–36.0)
MCV: 82 fl (ref 78.0–100.0)
MONO ABS: 0.8 10*3/uL (ref 0.1–1.0)
MONOS PCT: 10.4 % (ref 3.0–12.0)
NEUTROS PCT: 60.1 % (ref 43.0–77.0)
Neutro Abs: 4.8 10*3/uL (ref 1.4–7.7)
Platelets: 217 10*3/uL (ref 150.0–400.0)
RBC: 5.46 Mil/uL (ref 4.22–5.81)
RDW: 15.5 % (ref 11.5–15.5)
WBC: 7.9 10*3/uL (ref 4.0–10.5)

## 2016-06-12 LAB — LIPID PANEL
CHOL/HDL RATIO: 6
CHOLESTEROL: 208 mg/dL — AB (ref 0–200)
HDL: 35.4 mg/dL — ABNORMAL LOW (ref >39.00)
NonHDL: 172.83
Triglycerides: 247 mg/dL — ABNORMAL HIGH (ref 0.0–149.0)
VLDL: 49.4 mg/dL — AB (ref 0.0–40.0)

## 2016-06-12 LAB — LDL CHOLESTEROL, DIRECT: LDL DIRECT: 141 mg/dL

## 2016-06-12 LAB — PSA: PSA: 0.68 ng/mL (ref 0.10–4.00)

## 2016-06-18 ENCOUNTER — Ambulatory Visit (HOSPITAL_BASED_OUTPATIENT_CLINIC_OR_DEPARTMENT_OTHER)
Admission: RE | Admit: 2016-06-18 | Discharge: 2016-06-18 | Disposition: A | Discharge status: Home / Self Care | Payer: PPO | Source: Ambulatory Visit | Attending: Family Medicine | Admitting: Family Medicine

## 2016-06-18 DIAGNOSIS — I351 Nonrheumatic aortic (valve) insufficiency: Secondary | ICD-10-CM | POA: Diagnosis not present

## 2016-06-18 DIAGNOSIS — R0602 Shortness of breath: Secondary | ICD-10-CM | POA: Insufficient documentation

## 2016-06-18 DIAGNOSIS — E785 Hyperlipidemia, unspecified: Secondary | ICD-10-CM | POA: Insufficient documentation

## 2016-06-30 ENCOUNTER — Telehealth: Payer: Self-pay | Admitting: Family Medicine

## 2016-06-30 NOTE — Telephone Encounter (Signed)
Pt called in to request a stress test. Pt says that he was advised when having a ultrasound completed. Pt says that he mentioned that sometimes he has heart flutters.   Pt would like a call back to discuss further.

## 2016-07-01 NOTE — Telephone Encounter (Signed)
Spoke with pt to inform him provider is out of the office and I spoke with covering provider and suggest to wait until pt's provider is back in the office to determine which stress test is needed. Pt had no further questions or concerns. LB

## 2016-08-03 ENCOUNTER — Ambulatory Visit (AMBULATORY_SURGERY_CENTER): Payer: Self-pay

## 2016-08-03 VITALS — Ht 70.5 in | Wt 221.0 lb

## 2016-08-03 DIAGNOSIS — Z86010 Personal history of colonic polyps: Secondary | ICD-10-CM

## 2016-08-03 MED ORDER — NA SULFATE-K SULFATE-MG SULF 17.5-3.13-1.6 GM/177ML PO SOLN: ORAL | 0 refills | Status: DC

## 2016-08-03 NOTE — Progress Notes (Signed)
Patient denies allergies to eggs and soy. Patient is not on home O2 use.  No problems with anesthesia. Patient not taking any diet pills. Patient declined to watch emmi.

## 2016-08-11 ENCOUNTER — Telehealth (HOSPITAL_COMMUNITY): Payer: Self-pay | Admitting: *Deleted

## 2016-08-11 NOTE — Telephone Encounter (Signed)
Patient given detailed instructions per Myocardial Perfusion Study Information Sheet for the test on 08/12/16. Patient notified to arrive 15 minutes early and that it is imperative to arrive on time for appointment to keep from having the test rescheduled.  If you need to cancel or reschedule your appointment, please call the office within 24 hours of your appointment. Failure to do so may result in a cancellation of your appointment, and a $50 no show fee. Patient verbalized understanding. Torra Pala Jacqueline    

## 2016-08-12 ENCOUNTER — Ambulatory Visit (HOSPITAL_COMMUNITY): Payer: Medicare Other | Attending: Cardiology

## 2016-08-12 DIAGNOSIS — R079 Chest pain, unspecified: Secondary | ICD-10-CM | POA: Insufficient documentation

## 2016-08-12 DIAGNOSIS — R0602 Shortness of breath: Secondary | ICD-10-CM | POA: Diagnosis not present

## 2016-08-12 LAB — MYOCARDIAL PERFUSION IMAGING
CHL CUP RESTING HR STRESS: 60 {beats}/min
CSEPPHR: 85 {beats}/min
LVDIAVOL: 133 mL (ref 62–150)
LVSYSVOL: 67 mL
RATE: 0.27
SDS: 1
SRS: 1
SSS: 2
TID: 0.97

## 2016-08-12 MED ORDER — REGADENOSON 0.4 MG/5ML IV SOLN
0.4000 mg | Freq: Once | INTRAVENOUS | Status: AC
  Administered 2016-08-12: 0.4 mg via INTRAVENOUS

## 2016-08-12 MED ORDER — TECHNETIUM TC 99M TETROFOSMIN IV KIT
10.4000 | PACK | Freq: Once | INTRAVENOUS | Status: AC | PRN
  Administered 2016-08-12: 10.4 via INTRAVENOUS
  Filled 2016-08-12: qty 11

## 2016-08-12 MED ORDER — TECHNETIUM TC 99M TETROFOSMIN IV KIT
32.6000 | PACK | Freq: Once | INTRAVENOUS | Status: AC | PRN
  Administered 2016-08-12: 32.6 via INTRAVENOUS
  Filled 2016-08-12: qty 33

## 2016-08-17 ENCOUNTER — Encounter: Payer: Self-pay | Admitting: Gastroenterology

## 2016-08-17 ENCOUNTER — Ambulatory Visit (AMBULATORY_SURGERY_CENTER): Payer: Medicare Other | Admitting: Gastroenterology

## 2016-08-17 VITALS — BP 100/62 | HR 53 | Temp 97.5°F | Resp 18 | Ht 70.0 in | Wt 221.0 lb

## 2016-08-17 DIAGNOSIS — D123 Benign neoplasm of transverse colon: Secondary | ICD-10-CM

## 2016-08-17 DIAGNOSIS — D125 Benign neoplasm of sigmoid colon: Secondary | ICD-10-CM

## 2016-08-17 DIAGNOSIS — D126 Benign neoplasm of colon, unspecified: Secondary | ICD-10-CM

## 2016-08-17 DIAGNOSIS — Z86010 Personal history of colonic polyps: Secondary | ICD-10-CM

## 2016-08-17 DIAGNOSIS — K219 Gastro-esophageal reflux disease without esophagitis: Secondary | ICD-10-CM | POA: Diagnosis not present

## 2016-08-17 DIAGNOSIS — D120 Benign neoplasm of cecum: Secondary | ICD-10-CM

## 2016-08-17 DIAGNOSIS — D124 Benign neoplasm of descending colon: Secondary | ICD-10-CM | POA: Diagnosis not present

## 2016-08-17 DIAGNOSIS — K635 Polyp of colon: Secondary | ICD-10-CM

## 2016-08-17 MED ORDER — SODIUM CHLORIDE 0.9 % IV SOLN: 500.0000 mL | INTRAVENOUS | Status: DC

## 2016-08-17 NOTE — Patient Instructions (Signed)
YOU HAD AN ENDOSCOPIC PROCEDURE TODAY AT [facility]:   Refer to the procedure report that was given to you for any specific questions about what was found during the examination.  If the procedure report does not answer your questions, please call your gastroenterologist to clarify.  If you requested that your care partner not be given the details of your procedure findings, then the procedure report has been included in a sealed envelope for you to review at your convenience later.  YOU SHOULD EXPECT: Some feelings of bloating in the abdomen. Passage of more gas than usual.  Walking can help get rid of the air that was put into your GI tract during the procedure and reduce the bloating. If you had a lower endoscopy (such as a colonoscopy or flexible sigmoidoscopy) you may notice spotting of blood in your stool or on the toilet paper. If you underwent a bowel prep for your procedure, you may not have a normal bowel movement for a few days.  Please Note:  You might notice some irritation and congestion in your nose or some drainage.  This is from the oxygen used during your procedure.  There is no need for concern and it should clear up in a day or so.  SYMPTOMS TO REPORT IMMEDIATELY:   Following lower endoscopy (colonoscopy or flexible sigmoidoscopy):  Excessive amounts of blood in the stool  Significant tenderness or worsening of abdominal pains  Swelling of the abdomen that is new, acute  Fever of 100F or higher   For urgent or emergent issues, a gastroenterologist can be reached at any hour by calling [phone].   DIET:  We do recommend a small meal at first, but then you may proceed to your regular diet.  Drink plenty of fluids but you should avoid alcoholic beverages for 24 hours. Try to increase the fiber in your diet, and drink plenty of water.  ACTIVITY:  You should plan to take it easy for the rest of today and you should NOT DRIVE or use heavy machinery until  tomorrow (because of the sedation medicines used during the test).    FOLLOW UP: Our staff will call the number listed on your records the next business day following your procedure to check on you and address any questions or concerns that you may have regarding the information given to you following your procedure. If we do not reach you, we will leave a message.  However, if you are feeling well and you are not experiencing any problems, there is no need to return our call.  We will assume that you have returned to your regular daily activities without incident.  If any biopsies were taken you will be contacted by phone or by letter within the next 1-3 weeks.  Please call us at [phone] if you have not heard about the biopsies in 3 weeks.    SIGNATURES/CONFIDENTIALITY: You and/or your care partner have signed paperwork which will be entered into your electronic medical record.  These signatures attest to the fact that that the information above on your After Visit Summary has been reviewed and is understood.  Full responsibility of the confidentiality of this discharge information lies with you and/or your care-partner.  NO NSAIDS, aspirin, nor ibuprofen for two weeks after polyp removal to prevent bleeding.   Read all of the handouts given to you by your recovery room nurse.

## 2016-08-17 NOTE — Final Progress Note (Signed)
Metellus.Mess MD requested abdominal pressure. Pt HOB elevated 40 degrees. Coughing. O2 increased to 5 Liters. Pt vomited gastric contents, immediately suctioned, SaO2 stable 96-97 %. Pt level of sedation lightened. MD aware. BP stable 0830 Pt awake responding to questioning  To PACU

## 2016-08-17 NOTE — Progress Notes (Signed)
Pt's states no medical or surgical changes since previsit or office visit. 

## 2016-08-17 NOTE — Progress Notes (Signed)
Called to room to assist during endoscopic procedure.  Patient ID and intended procedure confirmed with present staff. Received instructions for my participation in the procedure from the performing physician.  

## 2016-08-17 NOTE — Op Note (Signed)
[facility] Patient Name: Anthony King Procedure Date: 08/17/2016 7:32 AM MRN: [id number] Endoscopist: Remo Lipps P. Makaiyah Schweiger MD, MD Age: 66 Referring MD:  Date of Birth: 1950/07/12 Gender: Male Account #: [id number] Procedure:                Colonoscopy Indications:              High risk colon cancer surveillance: Personal                            history of colonic polyps (multiple adenomas                            removed 4 years ago - 3 year recall) Medicines:                Monitored Anesthesia Care Procedure:                Pre-Anesthesia Assessment:                           - Prior to the procedure, a History and Physical                            was performed, and patient medications and                            allergies were reviewed. The patient's tolerance of                            previous anesthesia was also reviewed. The risks                            and benefits of the procedure and the sedation                            options and risks were discussed with the patient.                            All questions were answered, and informed consent                            was obtained. Prior Anticoagulants: The patient has                            taken no previous anticoagulant or antiplatelet                            agents. ASA Grade Assessment: II - A patient with                            mild systemic disease. After reviewing the risks                            and benefits, the patient was deemed in  satisfactory condition to undergo the procedure.                           After obtaining informed consent, the colonoscope                            was passed under direct vision. Throughout the                            procedure, the patient's blood pressure, pulse, and                            oxygen saturations were monitored continuously. The                            Model CF-HQ190L  [id number]) scope was introduced                            through the anus and advanced to the the cecum,                            identified by appendiceal orifice and ileocecal                            valve. The colonoscopy was performed without                            difficulty. The patient tolerated the procedure                            well. The quality of the bowel preparation was                            good. The ileocecal valve, appendiceal orifice, and                            rectum were photographed. Scope In: 8:05:14 AM Scope Out: 8:28:02 AM Scope Withdrawal Time: 0 hours 16 minutes 13 seconds  Total Procedure Duration: 0 hours 22 minutes 48 seconds  Findings:                 The perianal and digital rectal examinations were                            normal.                           Three sessile polyps were found in the cecum. The                            polyps were 3 to 4 mm in size. These polyps were                            removed with a cold snare. Resection and retrieval  were complete.                           Two sessile polyps were found in the hepatic                            flexure. The polyps were 4 to 5 mm in size. These                            polyps were removed with a cold snare. Resection                            and retrieval were complete.                           Three sessile polyps were found in the transverse                            colon. The polyps were 4 to 5 mm in size. These                            polyps were removed with a cold snare. Resection                            and retrieval were complete.                           Two sessile polyps were found in the descending                            colon. The polyps were 4 to 5 mm in size. These                            polyps were removed with a cold snare. Resection                            and retrieval were complete.                            A 4 mm polyp was found in the sigmoid colon. The                            polyp was sessile. The polyp was removed with a                            cold snare. Resection and retrieval were complete.                           Multiple medium-mouthed diverticula were found in                            the left colon and right colon.  Internal hemorrhoids were found during retroflexion.                           The exam was otherwise without abnormality. Complications:            No immediate complications. Estimated blood loss:                            Minimal. Estimated Blood Loss:     Estimated blood loss was minimal. Impression:               - Three 3 to 4 mm polyps in the cecum, removed with                            a cold snare. Resected and retrieved.                           - Two 4 to 5 mm polyps at the hepatic flexure,                            removed with a cold snare. Resected and retrieved.                           - Three 4 to 5 mm polyps in the transverse colon,                            removed with a cold snare. Resected and retrieved.                           - Two 4 to 5 mm polyps in the descending colon,                            removed with a cold snare. Resected and retrieved.                           - One 4 mm polyp in the sigmoid colon, removed with                            a cold snare. Resected and retrieved.                           - Diverticulosis in the left colon and in the right                            colon.                           - Internal hemorrhoids.                           - The examination was otherwise normal. Recommendation:           - Patient has a contact number available for  emergencies. The signs and symptoms of potential                            delayed complications were discussed with the                            patient. Return to  normal activities tomorrow.                            Written discharge instructions were provided to the                            patient.                           - Resume previous diet.                           - Continue present medications.                           - No ibuprofen, naproxen, or other non-steroidal                            anti-inflammatory drugs for 2 weeks after polyp                            removal.                           - Await pathology results.                           - Repeat colonoscopy is recommended for                            surveillance. The colonoscopy date will be                            determined after pathology results from today's                            exam become available for review. Remo Lipps P. Schon Zeiders MD, MD 08/17/2016 8:33:56 AM This report has been signed electronically.

## 2016-08-18 ENCOUNTER — Telehealth: Payer: Self-pay

## 2016-08-18 NOTE — Telephone Encounter (Signed)
  Follow up Call-  Call back number 08/17/2016  Post procedure Call Back phone  # [phone]  Permission to leave phone message Yes  Some recent data might be hidden     Patient questions:  Do you have a fever, pain , or abdominal swelling? No. Pain Score  0 *  Have you tolerated food without any problems? Yes.    Have you been able to return to your normal activities? Yes.    Do you have any questions about your discharge instructions: Diet   No. Medications  No. Follow up visit  No.  Do you have questions or concerns about your Care? No.  Actions: * If pain score is 4 or above: No action needed, pain <4.

## 2016-08-21 ENCOUNTER — Encounter: Payer: Self-pay | Admitting: Gastroenterology

## 2016-09-14 DIAGNOSIS — M79642 Pain in left hand: Secondary | ICD-10-CM | POA: Diagnosis not present

## 2016-09-14 DIAGNOSIS — M25521 Pain in right elbow: Secondary | ICD-10-CM | POA: Diagnosis not present

## 2016-10-12 ENCOUNTER — Ambulatory Visit (HOSPITAL_BASED_OUTPATIENT_CLINIC_OR_DEPARTMENT_OTHER)
Admission: RE | Admit: 2016-10-12 | Discharge: 2016-10-12 | Disposition: A | Discharge status: Home / Self Care | Payer: Medicare Other | Source: Ambulatory Visit | Attending: Medical | Admitting: Medical

## 2016-10-12 ENCOUNTER — Ambulatory Visit (INDEPENDENT_AMBULATORY_CARE_PROVIDER_SITE_OTHER): Payer: Medicare Other | Admitting: Medical

## 2016-10-12 ENCOUNTER — Encounter: Payer: Self-pay | Admitting: Medical

## 2016-10-12 VITALS — BP 114/70 | HR 75 | Temp 98.1°F | Resp 16 | Ht 72.0 in | Wt 218.6 lb

## 2016-10-12 DIAGNOSIS — R0781 Pleurodynia: Secondary | ICD-10-CM | POA: Diagnosis not present

## 2016-10-12 DIAGNOSIS — M546 Pain in thoracic spine: Secondary | ICD-10-CM

## 2016-10-12 MED ORDER — KETOROLAC TROMETHAMINE 60 MG/2ML IM SOLN
30.0000 mg | Freq: Once | INTRAMUSCULAR | Status: AC
  Administered 2016-10-12: 30 mg via INTRAMUSCULAR

## 2016-10-12 MED ORDER — TRAMADOL HCL 50 MG PO TABS
50.0000 mg | ORAL_TABLET | Freq: Three times a day (TID) | ORAL | 0 refills | Status: DC | PRN
Start: 2016-10-12 — End: 2017-10-04

## 2016-10-12 MED ORDER — DICLOFENAC SODIUM 75 MG PO TBEC: 75.0000 mg | DELAYED_RELEASE_TABLET | Freq: Two times a day (BID) | ORAL | 0 refills | Status: DC

## 2016-10-12 MED ORDER — CYCLOBENZAPRINE HCL 5 MG PO TABS: ORAL_TABLET | ORAL | 0 refills | Status: DC

## 2016-10-12 MED FILL — traMADol HCL 50 MG TABS: 50 | 4 days supply | Qty: 12 | Fill #0

## 2016-10-12 NOTE — Patient Instructions (Addendum)
For your rib area/thoracic back area pain we gave you toradol 30 mg im injection.  Tomorrow can start diclofenac rx nsaid.  For moderate to severe pain tramadol pain medication.  Rx flexeril muscle relaxant low dose to use at night next 7 days.  Please get xray of your chest and left ribs today.  Reminder to keep checking area and if any rash/small blister outbreak to area let us know immediately.  Follow up in 7 days or as needed

## 2016-10-12 NOTE — Progress Notes (Signed)
Subjective:    Patient ID: Anthony King, male    DOB: 02-Aug-1950, 66 y.o.   MRN: [id number]  HPI    Pt has  some pain in his lt side.(he points to left rib area/posterior thorax)  Pt states pain has been for about 3 weeks and random. Sometimes pain will be random and severe with certain movements/twisting his thorax. No fall or trauma. No rash on his skin.  No shortness of breath. No abdomen pain. No cva pain. No rash. No chest pain associated with the rib/back pain.  Pain occurs with minimal movenment.     Review of Systems  Constitutional: Negative for chills, fatigue and fever.  Respiratory: Negative for cough, chest tightness, shortness of breath and wheezing.   Cardiovascular: Negative for chest pain and palpitations.  Gastrointestinal: Negative for abdominal pain, blood in stool, constipation, diarrhea, nausea and vomiting.  Musculoskeletal:       See hpi left side rib pain posterior aspect. Pain on movement.  Skin: Negative for rash.  Neurological: Negative for dizziness, syncope, weakness and headaches.  Hematological: Negative for adenopathy. Does not bruise/bleed easily.  Psychiatric/Behavioral: Negative for behavioral problems and confusion.    Past Medical History:  Diagnosis Date  . Arthritis 07-17-11   hands, hips, knees  . Basal cell carcinoma    nose  . Chronic kidney disease 07-17-11   past kidney stone x1  . Colon polyps    ADENOMATOUS AND HYPERPLASTIC POLYPS  . Diverticulosis   . Gastric ulcer   . GERD (gastroesophageal reflux disease)   . Hemorrhoids   . Hyperlipidemia   . Wears glasses      Social History   Social History  . Marital status: Married    Spouse name: N/A  . Number of children: N/A  . Years of education: N/A   Occupational History  . Not on file.   Social History Main Topics  . Smoking status: Former Smoker    Quit date: 07/02/1991  . Smokeless tobacco: Never Used  . Alcohol use No  . Drug use: No  . Sexual  activity: Yes   Other Topics Concern  . Not on file   Social History Narrative  . No narrative on file    Past Surgical History:  Procedure Laterality Date  . APPENDECTOMY    . BASAL CELL CARCINOMA EXCISION     nose  . COLONOSCOPY    . fatty tumors  07-17-11   excised x4- 1 -neck, 1 arm, 2 chest  . INGUINAL HERNIA REPAIR  07/23/2011   Procedure: LAPAROSCOPIC INGUINAL HERNIA;  Surgeon: Stark Klein, MD;  Location: [facility];  Service: General;  Laterality: Left;  Marland Kitchen MASS EXCISION N/A 08/29/2013   Procedure: EXCISION LEFT FLANK MASS/RIGHT EXCISION CHEST WALL MASS;  Surgeon: Stark Klein, MD;  Location: [facility];  Service: General;  Laterality: N/A;  Left flank    Family History  Problem Relation Age of Onset  . Stomach cancer Father   . Stomach cancer Sister     Passed away 06/28/23  . Colon cancer Sister     mets from stomach  . Arthritis Mother   . Breast cancer Mother   . Arthritis Paternal Grandmother   . Arthritis Maternal Grandmother   . Esophageal cancer Neg Hx     Allergies  Allergen Reactions  . Aspirin Other (See Comments)    REACTION: nauseated, drooling    Current Outpatient Prescriptions on File Prior to Visit  Medication Sig  Dispense Refill  . calcium carbonate (TUMS EX) 750 MG chewable tablet Chew 2 tablets by mouth daily.    . Multiple Vitamins-Minerals (MULTIVITAMIN PO) Take 1 tablet by mouth daily.    . ranitidine (ZANTAC) 150 MG tablet Take 75 mg by mouth 2 (two) times daily.     . vitamin B-12 (CYANOCOBALAMIN) 1000 MCG tablet Take 2,000 mcg by mouth daily.      Current Facility-Administered Medications on File Prior to Visit  Medication Dose Route Frequency Provider Last Rate Last Dose  . 0.9 %  sodium chloride infusion  500 mL Intravenous Continuous Manus Gunning, MD        BP 114/70 (BP Location: Right Arm, Patient Position: Sitting, Cuff Size: Large)   Pulse 75   Temp 98.1 F (36.7 C) (Oral)   Resp 16   Ht 6' (1.829 m)    Wt 218 lb 9.6 oz (99.2 kg)   SpO2 98%   BMI 29.65 kg/m       Objective:   Physical Exam   General- No acute distress. Pleasant patient. Neck- Full range of motion, no jvd Lungs- Clear, even and unlabored. Heart- regular rate and rhythm. Neurologic- CNII- XII grossly intact. Back- no mid thoracic pain on palpation.  Lt side thoracic region- mid aspect beneath left scapula point tender area. But when he twists pain can be reproduced and shoot to scapula area. No rash. No vesicles. Pain appears to be between 2 ribs. (pain is about 2 inches below scapula)  CVA- no pain in cva area at all.      Assessment & Plan:  For your rib area/thoracic back area pain we gave you toradol 30 mg im injection.  Tomorrow can start diclofenac rx nsaid.  For moderate to severe pain tramadol pain medication.  Rx flexeril muscle relaxant low dose to use at night next 7 days.  Please get xray of your chest and left ribs today.  Reminder to keep checking area and if any rash/small blister outbreak to area let us know immediately.  Follow up in 7 days or as needed  Jalie Eiland, Percell Miller, [employer]

## 2016-10-12 NOTE — Progress Notes (Signed)
Pre visit review using our clinic review tool, if applicable. No additional management support is needed unless otherwise documented below in the visit note. 

## 2017-06-19 DIAGNOSIS — J209 Acute bronchitis, unspecified: Secondary | ICD-10-CM | POA: Diagnosis not present

## 2017-09-09 DIAGNOSIS — D485 Neoplasm of uncertain behavior of skin: Secondary | ICD-10-CM | POA: Diagnosis not present

## 2017-09-10 ENCOUNTER — Other Ambulatory Visit: Payer: Self-pay

## 2017-09-10 DIAGNOSIS — L820 Inflamed seborrheic keratosis: Secondary | ICD-10-CM | POA: Diagnosis not present

## 2017-10-04 ENCOUNTER — Encounter: Payer: Self-pay | Admitting: Family Medicine

## 2017-10-04 ENCOUNTER — Ambulatory Visit (INDEPENDENT_AMBULATORY_CARE_PROVIDER_SITE_OTHER): Payer: Medicare Other | Admitting: Family Medicine

## 2017-10-04 VITALS — BP 118/80 | HR 74 | Temp 98.4°F | Resp 16 | Ht 72.0 in | Wt 212.2 lb

## 2017-10-04 DIAGNOSIS — K219 Gastro-esophageal reflux disease without esophagitis: Secondary | ICD-10-CM | POA: Diagnosis not present

## 2017-10-04 DIAGNOSIS — K625 Hemorrhage of anus and rectum: Secondary | ICD-10-CM

## 2017-10-04 DIAGNOSIS — R32 Unspecified urinary incontinence: Secondary | ICD-10-CM

## 2017-10-04 DIAGNOSIS — Z23 Encounter for immunization: Secondary | ICD-10-CM | POA: Diagnosis not present

## 2017-10-04 DIAGNOSIS — E785 Hyperlipidemia, unspecified: Secondary | ICD-10-CM | POA: Diagnosis not present

## 2017-10-04 DIAGNOSIS — R159 Full incontinence of feces: Secondary | ICD-10-CM | POA: Diagnosis not present

## 2017-10-04 LAB — POC HEMOCCULT BLD/STL (OFFICE/1-CARD/DIAGNOSTIC)
Fecal Occult Blood, POC: NEGATIVE
OCCULT BLOOD DATE: 4082019

## 2017-10-04 NOTE — Addendum Note (Signed)
Addended by: Kem Boroughs D on: 10/04/2017 02:05 PM   Modules accepted: Orders

## 2017-10-04 NOTE — Patient Instructions (Signed)
Preventive Care 67 Years and Older, Male Preventive care refers to lifestyle choices and visits with your health care provider that can promote health and wellness. What does preventive care include?  A yearly physical exam. This is also called an annual well check.  Dental exams once or twice a year.  Routine eye exams. Ask your health care provider how often you should have your eyes checked.  Personal lifestyle choices, including: ? Daily care of your teeth and gums. ? Regular physical activity. ? Eating a healthy diet. ? Avoiding tobacco and drug use. ? Limiting alcohol use. ? Practicing safe sex. ? Taking low doses of aspirin every day. ? Taking vitamin and mineral supplements as recommended by your health care provider. What happens during an annual well check? The services and screenings done by your health care provider during your annual well check will depend on your age, overall health, lifestyle risk factors, and family history of disease. Counseling Your health care provider may ask you questions about your:  Alcohol use.  Tobacco use.  Drug use.  Emotional well-being.  Home and relationship well-being.  Sexual activity.  Eating habits.  History of falls.  Memory and ability to understand (cognition).  Work and work environment.  Screening You may have the following tests or measurements:  Height, weight, and BMI.  Blood pressure.  Lipid and cholesterol levels. These may be checked every 5 years, or more frequently if you are over 50 years old.  Skin check.  Lung cancer screening. You may have this screening every year starting at age 55 if you have a 30-pack-year history of smoking and currently smoke or have quit within the past 15 years.  Fecal occult blood test (FOBT) of the stool. You may have this test every year starting at age 50.  Flexible sigmoidoscopy or colonoscopy. You may have a sigmoidoscopy every 5 years or a colonoscopy every 10  years starting at age 50.  Prostate cancer screening. Recommendations will vary depending on your family history and other risks.  Hepatitis C blood test.  Hepatitis B blood test.  Sexually transmitted disease (STD) testing.  Diabetes screening. This is done by checking your blood sugar (glucose) after you have not eaten for a while (fasting). You may have this done every 1-3 years.  Abdominal aortic aneurysm (AAA) screening. You may need this if you are a current or former smoker.  Osteoporosis. You may be screened starting at age 70 if you are at high risk.  Talk with your health care provider about your test results, treatment options, and if necessary, the need for more tests. Vaccines Your health care provider may recommend certain vaccines, such as:  Influenza vaccine. This is recommended every year.  Tetanus, diphtheria, and acellular pertussis (Tdap, Td) vaccine. You may need a Td booster every 10 years.  Varicella vaccine. You may need this if you have not been vaccinated.  Zoster vaccine. You may need this after age 60.  Measles, mumps, and rubella (MMR) vaccine. You may need at least one dose of MMR if you were born in 1957 or later. You may also need a second dose.  Pneumococcal 13-valent conjugate (PCV13) vaccine. One dose is recommended after age 67.  Pneumococcal polysaccharide (PPSV23) vaccine. One dose is recommended after age 67.  Meningococcal vaccine. You may need this if you have certain conditions.  Hepatitis A vaccine. You may need this if you have certain conditions or if you travel or work in places where you   may be exposed to hepatitis A.  Hepatitis B vaccine. You may need this if you have certain conditions or if you travel or work in places where you may be exposed to hepatitis B.  Haemophilus influenzae type b (Hib) vaccine. You may need this if you have certain risk factors.  Talk to your health care provider about which screenings and vaccines  you need and how often you need them. This information is not intended to replace advice given to you by your health care provider. Make sure you discuss any questions you have with your health care provider. Document Released: 07/12/2015 Document Revised: 03/04/2016 Document Reviewed: 04/16/2015 Elsevier Interactive Patient Education  2018 [employer]  

## 2017-10-04 NOTE — Addendum Note (Signed)
Addended by: Kem Boroughs D on: 10/04/2017 02:03 PM   Modules accepted: Orders

## 2017-10-04 NOTE — Assessment & Plan Note (Addendum)
Cont diet  con't prevacid

## 2017-10-04 NOTE — Assessment & Plan Note (Signed)
Encouraged heart healthy diet, increase exercise, avoid trans fats, consider a krill oil cap daily 

## 2017-10-04 NOTE — Progress Notes (Signed)
em 

## 2017-10-04 NOTE — Progress Notes (Signed)
Patient ID: Anthony King, male    DOB: June 16, 1951  Age: 67 y.o. MRN: [id number]    Subjective:  Subjective  HPI Anthony King presents for f/u and c/o rectal bleeding with fecal incontinence. He has a hx of colon polyps and is due for colonoscopy 2021.    Review of Systems  Constitutional: Negative.   HENT: Negative for congestion, ear pain, hearing loss, nosebleeds, postnasal drip, rhinorrhea, sinus pressure, sneezing and tinnitus.   Eyes: Negative for photophobia, discharge, itching and visual disturbance.  Respiratory: Negative.   Cardiovascular: Negative.   Gastrointestinal: Positive for anal bleeding. Negative for abdominal distention, abdominal pain, blood in stool and constipation.  Endocrine: Negative.   Genitourinary: Negative.   Musculoskeletal: Negative.   Skin: Negative.   Allergic/Immunologic: Negative.   Neurological: Negative for dizziness, weakness, light-headedness, numbness and headaches.  Psychiatric/Behavioral: Negative for agitation, confusion, decreased concentration, dysphoric mood, sleep disturbance and suicidal ideas. The patient is not nervous/anxious.     History Past Medical History:  Diagnosis Date  . Arthritis 07-17-11   hands, hips, knees  . Basal cell carcinoma    nose  . Chronic kidney disease 07-17-11   past kidney stone x1  . Colon polyps    ADENOMATOUS AND HYPERPLASTIC POLYPS  . Diverticulosis   . Gastric ulcer   . GERD (gastroesophageal reflux disease)   . Hemorrhoids   . Hyperlipidemia   . Wears glasses     He has a past surgical history that includes Appendectomy; fatty tumors (07-17-11); Inguinal hernia repair (07/23/2011); Colonoscopy; Excision basal cell carcinoma; and Mass excision (N/A, 08/29/2013).   His family history includes Arthritis in his maternal grandmother, mother, and paternal grandmother; Breast cancer in his mother; Colon cancer in his sister; Stomach cancer in his father and sister.He reports that he quit  smoking about 26 years ago. He has never used smokeless tobacco. He reports that he does not drink alcohol or use drugs.  Current Outpatient Medications on File Prior to Visit  Medication Sig Dispense Refill  . aspirin EC 81 MG tablet Take 81 mg by mouth every other day.    . lansoprazole (PREVACID) 30 MG capsule Take 30 mg by mouth daily at 12 noon.    . vitamin B-12 (CYANOCOBALAMIN) 1000 MCG tablet Take 1,000 mcg by mouth daily.      Current Facility-Administered Medications on File Prior to Visit  Medication Dose Route Frequency Provider Last Rate Last Dose  . 0.9 %  sodium chloride infusion  500 mL Intravenous Continuous Armbruster, Carlota Raspberry, MD         Objective:  Objective  Physical Exam  Constitutional: He is oriented to person, place, and time. He appears well-developed and well-nourished. No distress.  HENT:  Head: Normocephalic and atraumatic.  Right Ear: External ear normal.  Left Ear: External ear normal.  Nose: Nose normal.  Mouth/Throat: Oropharynx is clear and moist. No oropharyngeal exudate.  Eyes: Pupils are equal, round, and reactive to light. Conjunctivae and EOM are normal. Right eye exhibits no discharge. Left eye exhibits no discharge.  Neck: Normal range of motion. Neck supple. No JVD present. No thyromegaly present.  Cardiovascular: Normal rate, regular rhythm and intact distal pulses. Exam reveals no gallop and no friction rub.  No murmur heard. Pulmonary/Chest: Effort normal and breath sounds normal. No respiratory distress. He has no wheezes. He has no rales. He exhibits no tenderness.  Abdominal: Soft. Bowel sounds are normal. He exhibits no distension and no mass. There is  no tenderness. There is no rebound and no guarding.  Genitourinary: Rectum normal, prostate normal and penis normal. Rectal exam shows guaiac negative stool.  Musculoskeletal: Normal range of motion. He exhibits no edema or tenderness.  Lymphadenopathy:    He has no cervical adenopathy.    Neurological: He is alert and oriented to person, place, and time. He displays normal reflexes. He exhibits normal muscle tone.  Skin: Skin is warm and dry. No rash noted. He is not diaphoretic. No erythema. No pallor.  Psychiatric: He has a normal mood and affect. His behavior is normal. Judgment and thought content normal.  Nursing note and vitals reviewed.  BP 118/80 (BP Location: Right Arm, Cuff Size: Normal)   Pulse 74   Temp 98.4 F (36.9 C) (Oral)   Resp 16   Ht 6' (1.829 m)   Wt 212 lb 3.2 oz (96.3 kg)   SpO2 98%   BMI 28.78 kg/m  Wt Readings from Last 3 Encounters:  10/04/17 212 lb 3.2 oz (96.3 kg)  10/12/16 218 lb 9.6 oz (99.2 kg)  08/17/16 221 lb (100.2 kg)     Lab Results  Component Value Date   WBC 7.9 06/11/2016   HGB 15.0 06/11/2016   HCT 44.8 06/11/2016   PLT 217.0 06/11/2016   GLUCOSE 104 (H) 06/11/2016   CHOL 208 (H) 06/11/2016   TRIG 247.0 (H) 06/11/2016   HDL 35.40 (L) 06/11/2016   LDLDIRECT 141.0 06/11/2016   ALT 21 06/11/2016   AST 22 06/11/2016   NA 141 06/11/2016   K 4.3 06/11/2016   CL 106 06/11/2016   CREATININE 1.31 06/11/2016   BUN 17 06/11/2016   CO2 28 06/11/2016   PSA 0.68 06/11/2016    Dg Ribs Unilateral W/chest Left  Result Date: 10/12/2016 CLINICAL DATA:  Left side posterior rib pain x 3 weeks, no known injury. No chest history. EXAM: LEFT RIBS AND CHEST - 3+ VIEW COMPARISON:  Chest x-ray dated 08/21/2013. FINDINGS: Heart size and mediastinal contours are normal. Lungs are clear. Lung volumes are normal. No pleural effusion or pneumothorax seen. No acute or suspicious osseous finding. No left-sided rib fracture or dislocation seen. IMPRESSION: Negative. Electronically Signed   By: Franki Cabot M.D.   On: 10/12/2016 18:06     Assessment & Plan:  Plan  I have discontinued Otila Back. Weisgerber's Multiple Vitamins-Minerals (MULTIVITAMIN PO), ranitidine, calcium carbonate, diclofenac, traMADol, and cyclobenzaprine. I am also having  him maintain his vitamin B-12, aspirin EC, and lansoprazole. We will continue to administer sodium chloride.  No orders of the defined types were placed in this encounter.   Problem List Items Addressed This Visit      Unprioritized   Esophageal reflux    Cont diet  con't prevacid      Relevant Medications   lansoprazole (PREVACID) 30 MG capsule   Other Relevant Orders   CBC with Differential/Platelet   Comprehensive metabolic panel   Lipid panel   PSA   Hyperlipidemia    Encouraged heart healthy diet, increase exercise, avoid trans fats, consider a krill oil cap daily      Relevant Medications   aspirin EC 81 MG tablet   Other Relevant Orders   Comprehensive metabolic panel   Lipid panel    Other Visit Diagnoses    Urinary incontinence, unspecified type    -  Primary   Relevant Orders   PSA   Rectal bleed       Relevant Orders   Ambulatory referral  to Gastroenterology   Incontinence of feces, unspecified fecal incontinence type       Relevant Orders   Ambulatory referral to Gastroenterology      Follow-up: Return if symptoms worsen or fail to improve.  Ann Held, DO

## 2017-10-05 NOTE — Addendum Note (Signed)
Addended by: Kem Boroughs D on: 10/05/2017 01:07 PM   Modules accepted: Orders

## 2017-11-17 ENCOUNTER — Encounter: Payer: Self-pay | Admitting: Family Medicine

## 2018-03-18 ENCOUNTER — Encounter (HOSPITAL_COMMUNITY): Payer: Self-pay | Admitting: *Deleted

## 2018-03-18 ENCOUNTER — Emergency Department (HOSPITAL_COMMUNITY): Payer: Medicare Other

## 2018-03-18 ENCOUNTER — Inpatient Hospital Stay (HOSPITAL_COMMUNITY)
Admission: EM | Admit: 2018-03-18 | Discharge: 2018-03-21 | DRG: 287 | Disposition: A | Discharge status: Home / Self Care | Payer: Medicare Other | Attending: Internal Medicine | Admitting: Internal Medicine

## 2018-03-18 ENCOUNTER — Other Ambulatory Visit: Payer: Self-pay

## 2018-03-18 DIAGNOSIS — R0789 Other chest pain: Secondary | ICD-10-CM | POA: Diagnosis not present

## 2018-03-18 DIAGNOSIS — R112 Nausea with vomiting, unspecified: Secondary | ICD-10-CM | POA: Diagnosis not present

## 2018-03-18 DIAGNOSIS — I200 Unstable angina: Secondary | ICD-10-CM

## 2018-03-18 DIAGNOSIS — R079 Chest pain, unspecified: Secondary | ICD-10-CM | POA: Diagnosis not present

## 2018-03-18 DIAGNOSIS — Z860100 Personal history of colon polyps, unspecified: Secondary | ICD-10-CM

## 2018-03-18 DIAGNOSIS — E663 Overweight: Secondary | ICD-10-CM | POA: Diagnosis present

## 2018-03-18 DIAGNOSIS — R7303 Prediabetes: Secondary | ICD-10-CM | POA: Diagnosis not present

## 2018-03-18 DIAGNOSIS — J90 Pleural effusion, not elsewhere classified: Secondary | ICD-10-CM | POA: Diagnosis not present

## 2018-03-18 DIAGNOSIS — N183 Chronic kidney disease, stage 3 unspecified: Secondary | ICD-10-CM | POA: Diagnosis present

## 2018-03-18 DIAGNOSIS — M25511 Pain in right shoulder: Secondary | ICD-10-CM | POA: Diagnosis present

## 2018-03-18 DIAGNOSIS — R1111 Vomiting without nausea: Secondary | ICD-10-CM | POA: Diagnosis not present

## 2018-03-18 DIAGNOSIS — M549 Dorsalgia, unspecified: Secondary | ICD-10-CM | POA: Diagnosis not present

## 2018-03-18 DIAGNOSIS — K219 Gastro-esophageal reflux disease without esophagitis: Secondary | ICD-10-CM | POA: Diagnosis not present

## 2018-03-18 DIAGNOSIS — Z6830 Body mass index (BMI) 30.0-30.9, adult: Secondary | ICD-10-CM

## 2018-03-18 DIAGNOSIS — Z79899 Other long term (current) drug therapy: Secondary | ICD-10-CM

## 2018-03-18 DIAGNOSIS — Z87891 Personal history of nicotine dependence: Secondary | ICD-10-CM

## 2018-03-18 DIAGNOSIS — R110 Nausea: Secondary | ICD-10-CM | POA: Diagnosis not present

## 2018-03-18 DIAGNOSIS — I2510 Atherosclerotic heart disease of native coronary artery without angina pectoris: Secondary | ICD-10-CM

## 2018-03-18 DIAGNOSIS — I959 Hypotension, unspecified: Secondary | ICD-10-CM | POA: Diagnosis not present

## 2018-03-18 DIAGNOSIS — Z886 Allergy status to analgesic agent status: Secondary | ICD-10-CM

## 2018-03-18 DIAGNOSIS — E785 Hyperlipidemia, unspecified: Secondary | ICD-10-CM | POA: Diagnosis present

## 2018-03-18 DIAGNOSIS — Z85828 Personal history of other malignant neoplasm of skin: Secondary | ICD-10-CM

## 2018-03-18 DIAGNOSIS — I25110 Atherosclerotic heart disease of native coronary artery with unstable angina pectoris: Principal | ICD-10-CM | POA: Diagnosis present

## 2018-03-18 DIAGNOSIS — I700 Atherosclerosis of aorta: Secondary | ICD-10-CM

## 2018-03-18 DIAGNOSIS — E538 Deficiency of other specified B group vitamins: Secondary | ICD-10-CM | POA: Diagnosis not present

## 2018-03-18 DIAGNOSIS — R001 Bradycardia, unspecified: Secondary | ICD-10-CM | POA: Diagnosis present

## 2018-03-18 DIAGNOSIS — I129 Hypertensive chronic kidney disease with stage 1 through stage 4 chronic kidney disease, or unspecified chronic kidney disease: Secondary | ICD-10-CM | POA: Diagnosis present

## 2018-03-18 DIAGNOSIS — K5730 Diverticulosis of large intestine without perforation or abscess without bleeding: Secondary | ICD-10-CM | POA: Diagnosis not present

## 2018-03-18 HISTORY — DX: Chronic kidney disease, stage 3 unspecified: N18.30

## 2018-03-18 LAB — I-STAT CHEM 8, ED
BUN: 15 mg/dL (ref 8–23)
CALCIUM ION: 1.12 mmol/L — AB (ref 1.15–1.40)
CHLORIDE: 107 mmol/L (ref 98–111)
Creatinine, Ser: 1.4 mg/dL — ABNORMAL HIGH (ref 0.61–1.24)
Glucose, Bld: 121 mg/dL — ABNORMAL HIGH (ref 70–99)
HCT: 39 % (ref 39.0–52.0)
HEMOGLOBIN: 13.3 g/dL (ref 13.0–17.0)
Potassium: 4.1 mmol/L (ref 3.5–5.1)
SODIUM: 143 mmol/L (ref 135–145)
TCO2: 23 mmol/L (ref 22–32)

## 2018-03-18 LAB — HEPATIC FUNCTION PANEL
ALT: 21 U/L (ref 0–44)
AST: 28 U/L (ref 15–41)
Albumin: 3.4 g/dL — ABNORMAL LOW (ref 3.5–5.0)
Alkaline Phosphatase: 76 U/L (ref 38–126)
BILIRUBIN INDIRECT: 0.8 mg/dL (ref 0.3–0.9)
Bilirubin, Direct: 0.2 mg/dL (ref 0.0–0.2)
TOTAL PROTEIN: 5.7 g/dL — AB (ref 6.5–8.1)
Total Bilirubin: 1 mg/dL (ref 0.3–1.2)

## 2018-03-18 LAB — CBC
HEMATOCRIT: 42.2 % (ref 39.0–52.0)
Hemoglobin: 12.9 g/dL — ABNORMAL LOW (ref 13.0–17.0)
MCH: 25.3 pg — AB (ref 26.0–34.0)
MCHC: 30.6 g/dL (ref 30.0–36.0)
MCV: 82.7 fL (ref 78.0–100.0)
Platelets: 181 10*3/uL (ref 150–400)
RBC: 5.1 MIL/uL (ref 4.22–5.81)
RDW: 14 % (ref 11.5–15.5)
WBC: 7 10*3/uL (ref 4.0–10.5)

## 2018-03-18 LAB — BASIC METABOLIC PANEL
Anion gap: 11 (ref 5–15)
BUN: 13 mg/dL (ref 8–23)
CO2: 20 mmol/L — AB (ref 22–32)
Calcium: 8.9 mg/dL (ref 8.9–10.3)
Chloride: 110 mmol/L (ref 98–111)
Creatinine, Ser: 1.4 mg/dL — ABNORMAL HIGH (ref 0.61–1.24)
GFR calc Af Amer: 59 mL/min — ABNORMAL LOW (ref >60)
GFR calc non Af Amer: 50 mL/min — ABNORMAL LOW (ref >60)
GLUCOSE: 130 mg/dL — AB (ref 70–99)
POTASSIUM: 4.2 mmol/L (ref 3.5–5.1)
Sodium: 141 mmol/L (ref 135–145)

## 2018-03-18 LAB — PROTIME-INR
INR: 1.01
Prothrombin Time: 13.2 seconds (ref 11.4–15.2)

## 2018-03-18 LAB — HEMOGLOBIN A1C
Hgb A1c MFr Bld: 5.7 % — ABNORMAL HIGH (ref 4.8–5.6)
Mean Plasma Glucose: 116.89 mg/dL

## 2018-03-18 LAB — TROPONIN I: Troponin I: <0.03 ng/mL (ref <0.03)

## 2018-03-18 LAB — I-STAT TROPONIN, ED: Troponin i, poc: 0 ng/mL (ref 0.00–0.08)

## 2018-03-18 LAB — I-STAT CREATININE, ED: CREATININE: 1.4 mg/dL — AB (ref 0.61–1.24)

## 2018-03-18 LAB — LIPASE, BLOOD: LIPASE: 29 U/L (ref 11–51)

## 2018-03-18 MED ORDER — MORPHINE SULFATE (PF) 4 MG/ML IV SOLN
4.0000 mg | INTRAVENOUS | Status: DC | PRN
  Filled 2018-03-18: qty 1

## 2018-03-18 MED ORDER — SODIUM CHLORIDE 0.9% FLUSH
3.0000 mL | Freq: Two times a day (BID) | INTRAVENOUS | Status: DC
  Administered 2018-03-18 – 2018-03-21 (×5): 3 mL via INTRAVENOUS

## 2018-03-18 MED ORDER — NITROGLYCERIN 0.4 MG SL SUBL: 0.4000 mg | SUBLINGUAL_TABLET | SUBLINGUAL | Status: DC | PRN

## 2018-03-18 MED ORDER — ENOXAPARIN SODIUM 40 MG/0.4ML ~~LOC~~ SOLN
40.0000 mg | SUBCUTANEOUS | Status: DC
  Administered 2018-03-18 – 2018-03-20 (×3): 40 mg via SUBCUTANEOUS
  Filled 2018-03-18 (×3): qty 0.4

## 2018-03-18 MED ORDER — VITAMIN B-12 1000 MCG PO TABS
1000.0000 ug | ORAL_TABLET | Freq: Every day | ORAL | Status: DC
  Administered 2018-03-19 – 2018-03-21 (×3): 1000 ug via ORAL
  Filled 2018-03-18 (×3): qty 1

## 2018-03-18 MED ORDER — ACETAMINOPHEN 325 MG PO TABS
650.0000 mg | ORAL_TABLET | Freq: Four times a day (QID) | ORAL | Status: DC | PRN
  Administered 2018-03-19 – 2018-03-20 (×2): 650 mg via ORAL
  Filled 2018-03-18 (×2): qty 2

## 2018-03-18 MED ORDER — IOPAMIDOL (ISOVUE-370) INJECTION 76%
INTRAVENOUS | Status: AC
  Filled 2018-03-18: qty 100

## 2018-03-18 MED ORDER — SODIUM CHLORIDE 0.9 % IV SOLN
INTRAVENOUS | Status: AC
  Administered 2018-03-18: 18:00:00 via INTRAVENOUS

## 2018-03-18 MED ORDER — ADULT MULTIVITAMIN W/MINERALS CH
1.0000 | ORAL_TABLET | Freq: Every day | ORAL | Status: DC
  Administered 2018-03-19 – 2018-03-21 (×3): 1 via ORAL
  Filled 2018-03-18 (×3): qty 1

## 2018-03-18 MED ORDER — ACETAMINOPHEN 650 MG RE SUPP: 650.0000 mg | Freq: Four times a day (QID) | RECTAL | Status: DC | PRN

## 2018-03-18 MED ORDER — PANTOPRAZOLE SODIUM 40 MG PO TBEC
40.0000 mg | DELAYED_RELEASE_TABLET | Freq: Every day | ORAL | Status: DC
  Administered 2018-03-19 – 2018-03-21 (×3): 40 mg via ORAL
  Filled 2018-03-18 (×4): qty 1

## 2018-03-18 MED ORDER — IOPAMIDOL (ISOVUE-370) INJECTION 76%
100.0000 mL | Freq: Once | INTRAVENOUS | Status: AC
  Administered 2018-03-18: 100 mL via INTRAVENOUS

## 2018-03-18 MED ORDER — ASPIRIN 325 MG PO TABS
325.0000 mg | ORAL_TABLET | Freq: Every day | ORAL | Status: DC
  Filled 2018-03-18 (×2): qty 1

## 2018-03-18 MED ORDER — MORPHINE SULFATE (PF) 4 MG/ML IV SOLN
4.0000 mg | Freq: Once | INTRAVENOUS | Status: AC
  Administered 2018-03-18: 4 mg via INTRAVENOUS
  Filled 2018-03-18: qty 1

## 2018-03-18 MED ORDER — ALUM & MAG HYDROXIDE-SIMETH 200-200-20 MG/5ML PO SUSP: 30.0000 mL | Freq: Four times a day (QID) | ORAL | Status: DC | PRN

## 2018-03-18 MED ORDER — HYDROMORPHONE HCL 1 MG/ML IJ SOLN
1.0000 mg | Freq: Once | INTRAMUSCULAR | Status: AC
  Administered 2018-03-18: 1 mg via INTRAVENOUS
  Filled 2018-03-18: qty 1

## 2018-03-18 NOTE — ED Notes (Signed)
No chest pain  Epigastric pain for 3 hours  No chest pain now

## 2018-03-18 NOTE — ED Notes (Signed)
Pt requested that IV in right AC be taken out for comfort. IV removed by this RN. Pt has IV in left AC.

## 2018-03-18 NOTE — ED Provider Notes (Signed)
[facility] EMERGENCY DEPARTMENT Provider Note   CSN: [id number] Arrival date & time: 03/18/18  1116     History   Chief Complaint Chief Complaint  Patient presents with  . Chest Pain    HPI Anthony King is a 67 y.o. male.  HPI 67 year old male presents with severe chest pain.  Started approximately 2 hours prior to arrival.  The pain is at the inferior aspect of his chest and is sharp and going straight to his back.  Some pain in his right shoulder.  He denies any dyspnea.  He is been having nausea and vomiting.  No sweating.  Pain is currently severe.  He was given aspirin and IV morphine which temporarily helped his pain but now is coming back.  No weakness or numbness in his extremities.  Past Medical History:  Diagnosis Date  . Arthritis 07-17-11   hands, hips, knees  . Basal cell carcinoma    nose  . Chronic kidney disease 07-17-11   past kidney stone x1  . Colon polyps    ADENOMATOUS AND HYPERPLASTIC POLYPS  . Diverticulosis   . Gastric ulcer   . GERD (gastroesophageal reflux disease)   . Hemorrhoids   . Hyperlipidemia   . Wears glasses     Patient Active Problem List   Diagnosis Date Noted  . Chest pain 03/18/2018  . Hyperlipidemia 10/04/2017  . Left flank mass, 3 cm subfascial 08/21/2013  . Mass of chest wall, right, 1 cm, subcutaneous. 08/21/2013  . Esophageal reflux 10/28/2011  . Personal history of colonic polyps 10/28/2011    Past Surgical History:  Procedure Laterality Date  . APPENDECTOMY    . BASAL CELL CARCINOMA EXCISION     nose  . COLONOSCOPY    . fatty tumors  07-17-11   excised x4- 1 -neck, 1 arm, 2 chest  . INGUINAL HERNIA REPAIR  07/23/2011   Procedure: LAPAROSCOPIC INGUINAL HERNIA;  Surgeon: Stark Klein, MD;  Location: [facility];  Service: General;  Laterality: Left;  Marland Kitchen MASS EXCISION N/A 08/29/2013   Procedure: EXCISION LEFT FLANK MASS/RIGHT EXCISION CHEST WALL MASS;  Surgeon: Stark Klein, MD;  Location: [facility];  Service: General;  Laterality: N/A;  Left flank        Home Medications    Prior to Admission medications   Medication Sig Start Date End Date Taking? Authorizing Provider  lansoprazole (PREVACID) 30 MG capsule Take 30 mg by mouth daily at 12 noon.   Yes [provider]  Multiple Vitamin (MULTIVITAMIN WITH MINERALS) TABS tablet Take 1 tablet by mouth daily.   Yes [provider]  vitamin B-12 (CYANOCOBALAMIN) 1000 MCG tablet Take 1,000 mcg by mouth daily.    Yes [provider]    Family History Family History  Problem Relation Age of Onset  . Stomach cancer Father   . Stomach cancer Sister        Passed away June 12, 2023  . Colon cancer Sister        mets from stomach  . Arthritis Mother   . Breast cancer Mother   . Arthritis Paternal Grandmother   . Arthritis Maternal Grandmother   . Esophageal cancer Neg Hx     Social History Social History   Tobacco Use  . Smoking status: Former Smoker    Last attempt to quit: 07/02/1991    Years since quitting: 26.7  . Smokeless tobacco: Never Used  Substance Use Topics  . Alcohol use: No  .  Drug use: No     Allergies   Aspirin   Review of Systems Review of Systems  Constitutional: Negative for diaphoresis.  Respiratory: Negative for shortness of breath.   Cardiovascular: Positive for chest pain.  Gastrointestinal: Positive for nausea and vomiting. Negative for abdominal pain.  Musculoskeletal: Positive for back pain.  All other systems reviewed and are negative.    Physical Exam Updated Vital Signs BP 126/73   Pulse 61   Temp (!) 97.4 F (36.3 C) (Oral)   Resp 17   SpO2 98%   Physical Exam  Constitutional: He is oriented to person, place, and time. He appears well-developed and well-nourished. He appears distressed.  HENT:  Head: Normocephalic and atraumatic.  Right Ear: External ear normal.  Left Ear: External ear normal.  Nose: Nose normal.  Eyes: Right eye exhibits  no discharge. Left eye exhibits no discharge.  Neck: Neck supple.  Cardiovascular: Normal rate, regular rhythm and normal heart sounds.  Pulmonary/Chest: Effort normal and breath sounds normal. He exhibits no tenderness.  Abdominal: Soft. He exhibits no distension. There is no tenderness.  Musculoskeletal: He exhibits no edema.  Neurological: He is alert and oriented to person, place, and time.  Skin: Skin is warm and dry. He is not diaphoretic.  Nursing note and vitals reviewed.    ED Treatments / Results  Labs (all labs ordered are listed, but only abnormal results are displayed) Labs Reviewed  BASIC METABOLIC PANEL - Abnormal; Notable for the following components:      Result Value   CO2 20 (*)    Glucose, Bld 130 (*)    Creatinine, Ser 1.40 (*)    GFR calc non Af Amer 50 (*)    GFR calc Af Amer 59 (*)    All other components within normal limits  CBC - Abnormal; Notable for the following components:   Hemoglobin 12.9 (*)    MCH 25.3 (*)    All other components within normal limits  HEPATIC FUNCTION PANEL - Abnormal; Notable for the following components:   Total Protein 5.7 (*)    Albumin 3.4 (*)    All other components within normal limits  I-STAT CREATININE, ED - Abnormal; Notable for the following components:   Creatinine, Ser 1.40 (*)    All other components within normal limits  I-STAT CHEM 8, ED - Abnormal; Notable for the following components:   Creatinine, Ser 1.40 (*)    Glucose, Bld 121 (*)    Calcium, Ion 1.12 (*)    All other components within normal limits  PROTIME-INR  LIPASE, BLOOD  I-STAT TROPONIN, ED    EKG EKG Interpretation  Date/Time:  Friday March 18 2018 12:26:45 EDT Ventricular Rate:  64 PR Interval:    QRS Duration: 104 QT Interval:  424 QTC Calculation: 438 R Axis:   68 Text Interpretation:  Sinus rhythm Borderline low voltage, extremity leads nonspecific St/T changes similar to earlier in the day Confirmed by Sherwood Gambler  [phone]) on 03/18/2018 12:33:02 PM Also confirmed by Sherwood Gambler [phone]), editor Philomena Doheny [phone])  on 03/18/2018 1:11:58 PM   Radiology Dg Chest Portable 1 View  Result Date: 03/18/2018 CLINICAL DATA:  Chest pain. EXAM: PORTABLE CHEST 1 VIEW COMPARISON:  Rib series 10/12/2016. FINDINGS: Mediastinum and hilar structures normal. Heart size stable. Lungs are clear. Small right pleural effusion. Heart size normal. No acute bony abnormality. IMPRESSION: 1.  Small right pleural effusion. 2.  Exam is otherwise unremarkable. Electronically Signed   By: Marcello Moores  Register   On: 03/18/2018 11:45   Ct Angio Chest/abd/pel For Dissection W And/or Wo Contrast  Result Date: 03/18/2018 CLINICAL DATA:  Sudden onset chest pain radiating to the back. EXAM: CT ANGIOGRAPHY CHEST, ABDOMEN AND PELVIS TECHNIQUE: Multidetector CT imaging through the chest, abdomen and pelvis was performed using the standard protocol during bolus administration of intravenous contrast. Multiplanar reconstructed images and MIPs were obtained and reviewed to evaluate the vascular anatomy. CONTRAST:  168mL ISOVUE-370 IOPAMIDOL (ISOVUE-370) INJECTION 76% COMPARISON:  Chest radiograph from earlier today. FINDINGS: CTA CHEST FINDINGS Cardiovascular: Normal heart size. No significant pericardial effusion/thickening. Left anterior descending coronary atherosclerosis. Normal course and caliber of the mildly atherosclerotic thoracic aorta. No evidence of acute intramural hematoma, dissection, pseudoaneurysm or penetrating atherosclerotic ulcer in the thoracic aorta. Patent aortic arch branch vessels. Normal caliber pulmonary arteries. No central pulmonary emboli. Mediastinum/Nodes: No discrete thyroid nodules. Unremarkable esophagus. No pathologically enlarged axillary, mediastinal or hilar lymph nodes. Lungs/Pleura: No pneumothorax. No pleural effusion. No acute consolidative airspace disease, lung masses or significant pulmonary nodules.  Musculoskeletal: Sclerotic exophytic 1.4 cm lesion at the lateral lower right scapula (series 7/image 69) without overtly aggressive features, favor an osteoma. Moderate thoracic spondylosis. Review of the MIP images confirms the above findings. CTA ABDOMEN AND PELVIS FINDINGS VASCULAR Aorta: Normal caliber mildly atherosclerotic aorta without aneurysm, dissection, vasculitis or significant stenosis. Celiac: Patent without evidence of aneurysm, dissection, vasculitis or significant stenosis. SMA: Patent without evidence of aneurysm, dissection, vasculitis or significant stenosis. Renals: Bilateral renal arteries are patent without evidence of aneurysm, dissection, vasculitis, fibromuscular dysplasia or significant stenosis. IMA: Patent without evidence of aneurysm, dissection, vasculitis or significant stenosis. Inflow: Patent without evidence of aneurysm, dissection, vasculitis or significant stenosis. Veins: No obvious venous abnormality within the limitations of this arterial phase study. Review of the MIP images confirms the above findings. NON-VASCULAR Hepatobiliary: Normal liver with no liver mass. Possible tiny gallstone in the gallbladder neck. No gallbladder wall thickening or pericholecystic fluid. No biliary ductal dilatation. Pancreas: Normal, with no mass or duct dilation. Spleen: Normal size. No mass. Adrenals/Urinary Tract: Normal adrenals. No hydronephrosis. No contour deforming renal masses. Normal bladder. Stomach/Bowel: Normal non-distended stomach. Normal caliber small bowel with no small bowel wall thickening. Appendectomy. Mild left colonic diverticulosis, most prominent in the sigmoid colon, with no large bowel wall thickening or pericolonic fat stranding. Vascular/Lymphatic: Atherosclerotic nonaneurysmal abdominal aorta. No pathologically enlarged lymph nodes in the abdomen or pelvis. Reproductive: Top-normal size prostate. Other: No pneumoperitoneum, ascites or focal fluid collection.  Musculoskeletal: No aggressive appearing focal osseous lesions. Nonspecific small sclerotic lesion in the upper left sacrum, most likely a benign bone island. Moderate lumbar spondylosis. Review of the MIP images confirms the above findings. IMPRESSION: 1. No acute aortic syndrome. 2. One vessel coronary atherosclerosis. 3. Possible cholelithiasis.  No evidence of acute cholecystitis. 4. Mild left colonic diverticulosis. 5.  Aortic Atherosclerosis (ICD10-I70.0). Electronically Signed   By: Ilona Sorrel M.D.   On: 03/18/2018 12:42   US Abdomen Limited Ruq  Result Date: 03/18/2018 CLINICAL DATA:  Chest pain. EXAM: ULTRASOUND ABDOMEN LIMITED RIGHT UPPER QUADRANT COMPARISON:  None. FINDINGS: Gallbladder: No gallstones or wall thickening visualized. No sonographic Murphy sign noted by sonographer. Maximal wall thickness is 1.5 mm, within normal limits Common bile duct: Diameter: 3.7 mm, within normal limits Liver: No focal lesion identified. Within normal limits in parenchymal echogenicity. Portal vein is patent on color Doppler imaging with normal direction of blood flow towards the liver. IMPRESSION: Negative right upper quadrant  ultrasound. No acute or focal lesion explain the patient's chest pain. Electronically Signed   By: San Morelle M.D.   On: 03/18/2018 14:41    Procedures Procedures (including critical care time)  Medications Ordered in ED Medications  morphine 4 MG/ML injection 4 mg (4 mg Intravenous Given 03/18/18 1154)  iopamidol (ISOVUE-370) 76 % injection 100 mL (100 mLs Intravenous Contrast Given 03/18/18 1215)  HYDROmorphone (DILAUDID) injection 1 mg (1 mg Intravenous Given 03/18/18 1240)     Initial Impression / Assessment and Plan / ED Course  I have reviewed the triage vital signs and the nursing notes.  Pertinent labs & imaging results that were available during my care of the patient were reviewed by me and considered in my medical decision making (see chart for  details).     The patient initially does not appear quite well and has severe chest pain.  He was given IV morphine and later IV Dilaudid when the morphine wore off.  Originally worried about dissection given the sharp chest and back pain.  Dissection study is negative.  Thus ultrasound obtained but shows no obvious gallbladder pathology.  ACS is still a concern and he will need admission for further rule out.  He was already given aspirin by EMS.  Esophageal spasm is also a consideration but would not be life-threatening and so he still needs to be admitted for the ACS rule out.  Hospitalist to admit.  Final Clinical Impressions(s) / ED Diagnoses   Final diagnoses:  Chest pain    ED Discharge Orders    None       Sherwood Gambler, MD 03/18/18 1609

## 2018-03-18 NOTE — ED Notes (Signed)
This RN called Lab to add on lipase and hepatic function

## 2018-03-18 NOTE — ED Triage Notes (Signed)
Pt in via EMS c/o sudden onset of chest pain, started out left and has moved substernal, radiates into his back, zofran given for vomiting, nitro x1 as well, episodes of bradycardia and hypertension then hypotension, pt visibly uncomfortable on arrival

## 2018-03-18 NOTE — Consult Note (Signed)
Cardiology Consultation:   Patient ID: [name] MRN: [id number]; DOB: 1950-12-02  Admit date: 03/18/2018 Date of Consult: 03/18/2018  Primary Care Provider: Carollee Herter, Alferd Apa, DO Primary Cardiologist: No primary care provider on file. New   Patient Profile:   Anthony King is a 67 y.o. male with a hx of chronic kidney disease, GERD and hyperlipidemia who is being seen today for the evaluation of chest pain at the request of [place].  History of Present Illness:   Anthony King is a 67 yo male with history of GERD, CKD and hyperlipidemia admitted with chest pain. He has a remote history of tobacco abuse. No prior cardiac disease. His first troponin in the ED is normal. EKG with subtle abnormalities. Chest CTA negative for aortic dissection and no pulmonary emboli. He has not tolerated ASA in the past due to easy bruising and nausea per pt.  He had a normal stress test in February 2018.   He reports severe chest pain this am. 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 The pain was heavy in the center of his chest. There was associated dyspnea. He took 4 ASA and called EMS. SL NTG given by EMS did not relieve his pain. He became nauseous and had several episodes of vomiting. He was given morphine. His pain persisted for 3 hours. His pain was relieved in the ED with morphine. He is now pain free. He has no dyspnea.   Past Medical History:  Diagnosis Date  . Arthritis 07-17-11   hands, hips, knees  . Basal cell carcinoma    nose  . Chronic kidney disease 07-17-11   past kidney stone x1  . Colon polyps    ADENOMATOUS AND HYPERPLASTIC POLYPS  . Diverticulosis   . Gastric ulcer   . GERD (gastroesophageal reflux disease)   . Hemorrhoids   . Hyperlipidemia   . Wears glasses     Past Surgical History:  Procedure Laterality Date  . APPENDECTOMY    . BASAL CELL CARCINOMA EXCISION     nose  . COLONOSCOPY    . fatty tumors  07-17-11   excised x4- 1 -neck,  1 arm, 2 chest  . INGUINAL HERNIA REPAIR  07/23/2011   Procedure: LAPAROSCOPIC INGUINAL HERNIA;  Surgeon: Stark Klein, MD;  Location: [facility];  Service: General;  Laterality: Left;  Marland Kitchen MASS EXCISION N/A 08/29/2013   Procedure: EXCISION LEFT FLANK MASS/RIGHT EXCISION CHEST WALL MASS;  Surgeon: Stark Klein, MD;  Location: [facility];  Service: General;  Laterality: N/A;  Left flank     Home Medications:  Prior to Admission medications   Medication Sig Start Date End Date Taking? Authorizing Provider  lansoprazole (PREVACID) 30 MG capsule Take 30 mg by mouth daily at 12 noon.   Yes [provider]  Multiple Vitamin (MULTIVITAMIN WITH MINERALS) TABS tablet Take 1 tablet by mouth daily.   Yes [provider]  vitamin B-12 (CYANOCOBALAMIN) 1000 MCG tablet Take 1,000 mcg by mouth daily.    Yes [provider]    Inpatient Medications: Scheduled Meds: . enoxaparin (LOVENOX) injection  40 mg Subcutaneous Q24H  . [START ON 03/19/2018] multivitamin with minerals  1 tablet Oral Daily  . [START ON 03/19/2018] pantoprazole  40 mg Oral Daily  . sodium chloride flush  3 mL Intravenous Q12H  . [START ON 03/19/2018] vitamin B-12  1,000 mcg Oral Daily   Continuous Infusions:  PRN Meds: acetaminophen **OR** acetaminophen, alum & mag hydroxide-simeth,  morphine injection, nitroGLYCERIN  Allergies:    Allergies  Allergen Reactions  . Aspirin Other (See Comments)    REACTION: nauseated, drooling    Social History:   Social History   Socioeconomic History  . Marital status: Married    Spouse name: Not on file  . Number of children: Not on file  . Years of education: Not on file  . Highest education level: Not on file  Occupational History  . Not on file  Social Needs  . Financial resource strain: Not on file  . Food insecurity:    Worry: Not on file    Inability: Not on file  . Transportation needs:    Medical: Not on file    Non-medical: Not on file    Tobacco Use  . Smoking status: Former Smoker    Last attempt to quit: 07/02/1991    Years since quitting: 26.7  . Smokeless tobacco: Never Used  Substance and Sexual Activity  . Alcohol use: No  . Drug use: No  . Sexual activity: Yes  Lifestyle  . Physical activity:    Days per week: Not on file    Minutes per session: Not on file  . Stress: Not on file  Relationships  . Social connections:    Talks on phone: Not on file    Gets together: Not on file    Attends religious service: Not on file    Active member of club or organization: Not on file    Attends meetings of clubs or organizations: Not on file    Relationship status: Not on file  . Intimate partner violence:    Fear of current or ex partner: Not on file    Emotionally abused: Not on file    Physically abused: Not on file    Forced sexual activity: Not on file  Other Topics Concern  . Not on file  Social History Narrative   Exercise- no    Family History:    Family History  Problem Relation Age of Onset  . Stomach cancer Father   . Stomach cancer Sister        Passed away 06-26-23  . Colon cancer Sister        mets from stomach  . Arthritis Mother   . Breast cancer Mother   . Arthritis Paternal Grandmother   . Arthritis Maternal Grandmother   . Esophageal cancer Neg Hx      ROS:  Please see the history of present illness.  All other ROS reviewed and negative.     Physical Exam/Data:   Vitals:   03/18/18 1500 03/18/18 1530 03/18/18 1600 03/18/18 1705  BP: 120/70 117/71 126/73   Pulse: 64 (!) 56 61   Resp: 11 14 17    Temp:      TempSrc:      SpO2: 100% 100% 98%   Weight:    97.5 kg  Height:    5\' 10"  (1.778 m)   No intake or output data in the 24 hours ending 03/18/18 1716 Filed Weights   03/18/18 1705  Weight: 97.5 kg   Body mass index is 30.85 kg/m.  General:  Well nourished, well developed, in no acute distress HEENT: normal Lymph: no adenopathy Neck: no JVD Endocrine:  No  thryomegaly Vascular: No carotid bruits; FA pulses 2+ bilaterally without bruits  Cardiac:  normal S1, S2; RRR; no murmur  Lungs:  clear to auscultation bilaterally, no wheezing, rhonchi or rales  Abd: soft, nontender, no  hepatomegaly  Ext: no edema Musculoskeletal:  No deformities, BUE and BLE strength normal and equal Skin: warm and dry  Neuro:  CNs 2-12 intact, no focal abnormalities noted Psych:  Normal affect   EKG:  The EKG was personally reviewed and demonstrates:  Sinus rhythm, non-specific ST and T wave abnormalities.  Telemetry:  Telemetry was personally reviewed and demonstrates:  Sinus with PVCs  Relevant CV Studies:   Laboratory Data:  Chemistry Recent Labs  Lab 03/18/18 1127 03/18/18 1145 03/18/18 1207  NA 141  --  143  K 4.2  --  4.1  CL 110  --  107  CO2 20*  --   --   GLUCOSE 130*  --  121*  BUN 13  --  15  CREATININE 1.40* 1.40* 1.40*  CALCIUM 8.9  --   --   GFRNONAA 50*  --   --   GFRAA 59*  --   --   ANIONGAP 11  --   --     Recent Labs  Lab 03/18/18 1127  PROT 5.7*  ALBUMIN 3.4*  AST 28  ALT 21  ALKPHOS 76  BILITOT 1.0   Hematology Recent Labs  Lab 03/18/18 1127 03/18/18 1207  WBC 7.0  --   RBC 5.10  --   HGB 12.9* 13.3  HCT 42.2 39.0  MCV 82.7  --   MCH 25.3*  --   MCHC 30.6  --   RDW 14.0  --   PLT 181  --    Cardiac EnzymesNo results for input(s): TROPONINI in the last 168 hours.  Recent Labs  Lab 03/18/18 1144  TROPIPOC 0.00    BNPNo results for input(s): BNP, PROBNP in the last 168 hours.  DDimer No results for input(s): DDIMER in the last 168 hours.  Radiology/Studies:  Dg Chest Portable 1 View  Result Date: 03/18/2018 CLINICAL DATA:  Chest pain. EXAM: PORTABLE CHEST 1 VIEW COMPARISON:  Rib series 10/12/2016. FINDINGS: Mediastinum and hilar structures normal. Heart size stable. Lungs are clear. Small right pleural effusion. Heart size normal. No acute bony abnormality. IMPRESSION: 1.  Small right pleural effusion. 2.   Exam is otherwise unremarkable. Electronically Signed   By: Marcello Moores  Register   On: 03/18/2018 11:45   Ct Angio Chest/abd/pel For Dissection W And/or Wo Contrast  Result Date: 03/18/2018 CLINICAL DATA:  Sudden onset chest pain radiating to the back. EXAM: CT ANGIOGRAPHY CHEST, ABDOMEN AND PELVIS TECHNIQUE: Multidetector CT imaging through the chest, abdomen and pelvis was performed using the standard protocol during bolus administration of intravenous contrast. Multiplanar reconstructed images and MIPs were obtained and reviewed to evaluate the vascular anatomy. CONTRAST:  169mL ISOVUE-370 IOPAMIDOL (ISOVUE-370) INJECTION 76% COMPARISON:  Chest radiograph from earlier today. FINDINGS: CTA CHEST FINDINGS Cardiovascular: Normal heart size. No significant pericardial effusion/thickening. Left anterior descending coronary atherosclerosis. Normal course and caliber of the mildly atherosclerotic thoracic aorta. No evidence of acute intramural hematoma, dissection, pseudoaneurysm or penetrating atherosclerotic ulcer in the thoracic aorta. Patent aortic arch branch vessels. Normal caliber pulmonary arteries. No central pulmonary emboli. Mediastinum/Nodes: No discrete thyroid nodules. Unremarkable esophagus. No pathologically enlarged axillary, mediastinal or hilar lymph nodes. Lungs/Pleura: No pneumothorax. No pleural effusion. No acute consolidative airspace disease, lung masses or significant pulmonary nodules. Musculoskeletal: Sclerotic exophytic 1.4 cm lesion at the lateral lower right scapula (series 7/image 69) without overtly aggressive features, favor an osteoma. Moderate thoracic spondylosis. Review of the MIP images confirms the above findings. CTA ABDOMEN AND PELVIS FINDINGS VASCULAR Aorta:  Normal caliber mildly atherosclerotic aorta without aneurysm, dissection, vasculitis or significant stenosis. Celiac: Patent without evidence of aneurysm, dissection, vasculitis or significant stenosis. SMA: Patent without  evidence of aneurysm, dissection, vasculitis or significant stenosis. Renals: Bilateral renal arteries are patent without evidence of aneurysm, dissection, vasculitis, fibromuscular dysplasia or significant stenosis. IMA: Patent without evidence of aneurysm, dissection, vasculitis or significant stenosis. Inflow: Patent without evidence of aneurysm, dissection, vasculitis or significant stenosis. Veins: No obvious venous abnormality within the limitations of this arterial phase study. Review of the MIP images confirms the above findings. NON-VASCULAR Hepatobiliary: Normal liver with no liver mass. Possible tiny gallstone in the gallbladder neck. No gallbladder wall thickening or pericholecystic fluid. No biliary ductal dilatation. Pancreas: Normal, with no mass or duct dilation. Spleen: Normal size. No mass. Adrenals/Urinary Tract: Normal adrenals. No hydronephrosis. No contour deforming renal masses. Normal bladder. Stomach/Bowel: Normal non-distended stomach. Normal caliber small bowel with no small bowel wall thickening. Appendectomy. Mild left colonic diverticulosis, most prominent in the sigmoid colon, with no large bowel wall thickening or pericolonic fat stranding. Vascular/Lymphatic: Atherosclerotic nonaneurysmal abdominal aorta. No pathologically enlarged lymph nodes in the abdomen or pelvis. Reproductive: Top-normal size prostate. Other: No pneumoperitoneum, ascites or focal fluid collection. Musculoskeletal: No aggressive appearing focal osseous lesions. Nonspecific small sclerotic lesion in the upper left sacrum, most likely a benign bone island. Moderate lumbar spondylosis. Review of the MIP images confirms the above findings. IMPRESSION: 1. No acute aortic syndrome. 2. One vessel coronary atherosclerosis. 3. Possible cholelithiasis.  No evidence of acute cholecystitis. 4. Mild left colonic diverticulosis. 5.  Aortic Atherosclerosis (ICD10-I70.0). Electronically Signed   By: Ilona Sorrel M.D.   On:  03/18/2018 12:42   US Abdomen Limited Ruq  Result Date: 03/18/2018 CLINICAL DATA:  Chest pain. EXAM: ULTRASOUND ABDOMEN LIMITED RIGHT UPPER QUADRANT COMPARISON:  None. FINDINGS: Gallbladder: No gallstones or wall thickening visualized. No sonographic Murphy sign noted by sonographer. Maximal wall thickness is 1.5 mm, within normal limits Common bile duct: Diameter: 3.7 mm, within normal limits Liver: No focal lesion identified. Within normal limits in parenchymal echogenicity. Portal vein is patent on color Doppler imaging with normal direction of blood flow towards the liver. IMPRESSION: Negative right upper quadrant ultrasound. No acute or focal lesion explain the patient's chest pain. Electronically Signed   By: San Morelle M.D.   On: 03/18/2018 14:41    Assessment and Plan:   1. Chest pain/Unstable angina: He has no known CAD. Risk factors for CAD include hyperlipidemia and former tobacco abuse. His clinical presentation is worrisome for unstable angina. First troponin is negative. EKG with subtle abnormalities. I would recommend an echo to assess LV systolic function. Cycle troponin tonight. I have discussed cardiac catheterization and would favor this approach prior to discharge. I would only start heparin tonight if his troponin becomes positive. He reports an ASA intolerance which causes nausea.   We will see him in the am.    For questions or updates, please contact [place] Please consult [email or web address] for contact info under     Signed, Lauree Chandler, MD  03/18/2018 5:16 PM

## 2018-03-18 NOTE — H&P (Signed)
History and Physical    Anthony King QMG:[id number] DOB: [birth year] DOA: 03/18/2018  PCP: Ann Held, DO   I have briefly reviewed patients previous medical reports in [facility].  Patient coming from: Home  Chief Complaint: Chest pain  HPI: Anthony King is a very pleasant 67 year old married male, physically quite active, lives in a 1 level house, does upholstery work for a living and is "always on his feet", PMH of GERD, hyperlipidemia, colonic "precancerous" polyps status post colonoscopy and polypectomy last year, stage III chronic kidney disease, remote smoker, presented to [facility] ED due to chest pain.  Patient was in his usual state of health until 10:30 AM on day of admission when he was standing at his work desk and talking to his sister on the phone when he experienced sudden onset of right lower parasternal chest pain, sharp, 10/10 in intensity, went through and through to between his shoulder blades, associated with diaphoresis and flushing but no nausea.  He thought that this may have been due to "gas" and took 2 Tums and drank some warm Canadian dry without relief.  His family then called EMS.  He was given 4 baby aspirins to chew and sublingual nitroglycerin which did not help his chest pain but he had 3 episodes of nonbloody emesis.  Chest pain continued on ED arrival and apparently required 3 doses of IV pain medications (morphine/Dilaudid) and eventually pain resolved after 3 hours.  Pain not similar to his heartburn pains.  Has not had this kind of pains before.  Gives history of moving some heavy objects/furniture 2 days PTA but no recollection of spraining anything.  No family history of CAD but strong family history of cancer.  Nuclear stress test 08/12/2016: Low risk study.  Patient currently asymptomatic of chest pain.  He also had transient cold and numb feeling of both lower hands with pallor but denies any other strokelike symptoms.  ED Course: Lab  work significant for creatinine of 1.4, POC troponin x1 normal, due to concern for aortic dissection, underwent CTA of the chest abdomen and pelvis: No acute aortic syndrome, one-vessel coronary atherosclerosis, possible cholelithiasis, no evidence of acute cholecystitis, aortic atherosclerosis and no central pulmonary emboli.  Lipase and LFTs unremarkable.  RUQ ultrasound: Negative.  Review of Systems:  All other systems reviewed and apart from HPI, are negative.  Patient reports that he had 6 teeth removed and to root canal treatment recently without symptoms or complications.  Denies taking any prescription medications.  Stopped taking baby aspirin due to easy bruising.  Past Medical History:  Diagnosis Date  . Arthritis 07-17-11   hands, hips, knees  . Basal cell carcinoma    nose  . Chronic kidney disease 07-17-11   past kidney stone x1  . Colon polyps    ADENOMATOUS AND HYPERPLASTIC POLYPS  . Diverticulosis   . Gastric ulcer   . GERD (gastroesophageal reflux disease)   . Hemorrhoids   . Hyperlipidemia   . Wears glasses     Past Surgical History:  Procedure Laterality Date  . APPENDECTOMY    . BASAL CELL CARCINOMA EXCISION     nose  . COLONOSCOPY    . fatty tumors  07-17-11   excised x4- 1 -neck, 1 arm, 2 chest  . INGUINAL HERNIA REPAIR  07/23/2011   Procedure: LAPAROSCOPIC INGUINAL HERNIA;  Surgeon: Stark Klein, MD;  Location: [facility];  Service: General;  Laterality: Left;  Marland Kitchen MASS EXCISION N/A 08/29/2013  Procedure: EXCISION LEFT FLANK MASS/RIGHT EXCISION CHEST WALL MASS;  Surgeon: Stark Klein, MD;  Location: [place];  Service: General;  Laterality: N/A;  Left flank    Social History  reports that he quit smoking about 26 years ago. He has never used smokeless tobacco. He reports that he does not drink alcohol or use drugs.  Allergies  Allergen Reactions  . Aspirin Other (See Comments)    REACTION: nauseated, drooling    Family History  Problem  Relation Age of Onset  . Stomach cancer Father   . Stomach cancer Sister        Passed away 10-Jul-2023  . Colon cancer Sister        mets from stomach  . Arthritis Mother   . Breast cancer Mother   . Arthritis Paternal Grandmother   . Arthritis Maternal Grandmother   . Esophageal cancer Neg Hx      Prior to Admission medications   Medication Sig Start Date End Date Taking? Authorizing Provider  lansoprazole (PREVACID) 30 MG capsule Take 30 mg by mouth daily at 12 noon.   Yes [provider]  Multiple Vitamin (MULTIVITAMIN WITH MINERALS) TABS tablet Take 1 tablet by mouth daily.   Yes [provider]  vitamin B-12 (CYANOCOBALAMIN) 1000 MCG tablet Take 1,000 mcg by mouth daily.    Yes [provider]    Physical Exam: Vitals:   03/18/18 1500 03/18/18 1530 03/18/18 1600 03/18/18 1705  BP: 120/70 117/71 126/73   Pulse: 64 (!) 56 61   Resp: 11 14 17    Temp:      TempSrc:      SpO2: 100% 100% 98%   Weight:    97.5 kg  Height:    5\' 10"  (1.778 m)      Constitutional: Pleasant middle-aged male, moderately built and overweight, sitting up comfortably at edge of bed. Eyes: PERTLA, lids and conjunctivae normal ENMT: Mucous membranes are moist. Posterior pharynx clear of any exudate or lesions. Normal dentition.  Neck: supple, no masses, no thyromegaly Respiratory: clear to auscultation bilaterally, no wheezing, no crackles. Normal respiratory effort. No accessory muscle use.  No reproducible chest wall tenderness. Cardiovascular: S1 & S2 heard, regular rate and rhythm, no murmurs / rubs / gallops. No extremity edema. 2+ pedal pulses. No carotid bruits.  Abdomen: No distension, no tenderness, no masses palpated. No hepatosplenomegaly or other organomegaly appreciated. Bowel sounds normal.  Musculoskeletal: no clubbing / cyanosis. No joint deformity upper and lower extremities. Good ROM, no contractures. Normal muscle tone.  Skin: no rashes, lesions, ulcers. No  induration Neurologic: CN 2-12 grossly intact. Sensation intact, DTR normal. Strength 5/5 in all 4 limbs.  Psychiatric: Normal judgment and insight. Alert and oriented x 3. Normal mood.     Labs on Admission: I have personally reviewed following labs and imaging studies  CBC: Recent Labs  Lab 03/18/18 1127 03/18/18 1207  WBC 7.0  --   HGB 12.9* 13.3  HCT 42.2 39.0  MCV 82.7  --   PLT 181  --    Basic Metabolic Panel: Recent Labs  Lab 03/18/18 1127 03/18/18 1145 03/18/18 1207  NA 141  --  143  K 4.2  --  4.1  CL 110  --  107  CO2 20*  --   --   GLUCOSE 130*  --  121*  BUN 13  --  15  CREATININE 1.40* 1.40* 1.40*  CALCIUM 8.9  --   --  Liver Function Tests: Recent Labs  Lab 03/18/18 1127  AST 28  ALT 21  ALKPHOS 76  BILITOT 1.0  PROT 5.7*  ALBUMIN 3.4*   Coagulation Profile: Recent Labs  Lab 03/18/18 1139  INR 1.01      Radiological Exams on Admission: Dg Chest Portable 1 View  Result Date: 03/18/2018 CLINICAL DATA:  Chest pain. EXAM: PORTABLE CHEST 1 VIEW COMPARISON:  Rib series 10/12/2016. FINDINGS: Mediastinum and hilar structures normal. Heart size stable. Lungs are clear. Small right pleural effusion. Heart size normal. No acute bony abnormality. IMPRESSION: 1.  Small right pleural effusion. 2.  Exam is otherwise unremarkable. Electronically Signed   By: Marcello Moores  Register   On: 03/18/2018 11:45   Ct Angio Chest/abd/pel For Dissection W And/or Wo Contrast  Result Date: 03/18/2018 CLINICAL DATA:  Sudden onset chest pain radiating to the back. EXAM: CT ANGIOGRAPHY CHEST, ABDOMEN AND PELVIS TECHNIQUE: Multidetector CT imaging through the chest, abdomen and pelvis was performed using the standard protocol during bolus administration of intravenous contrast. Multiplanar reconstructed images and MIPs were obtained and reviewed to evaluate the vascular anatomy. CONTRAST:  180mL ISOVUE-370 IOPAMIDOL (ISOVUE-370) INJECTION 76% COMPARISON:  Chest radiograph from  earlier today. FINDINGS: CTA CHEST FINDINGS Cardiovascular: Normal heart size. No significant pericardial effusion/thickening. Left anterior descending coronary atherosclerosis. Normal course and caliber of the mildly atherosclerotic thoracic aorta. No evidence of acute intramural hematoma, dissection, pseudoaneurysm or penetrating atherosclerotic ulcer in the thoracic aorta. Patent aortic arch branch vessels. Normal caliber pulmonary arteries. No central pulmonary emboli. Mediastinum/Nodes: No discrete thyroid nodules. Unremarkable esophagus. No pathologically enlarged axillary, mediastinal or hilar lymph nodes. Lungs/Pleura: No pneumothorax. No pleural effusion. No acute consolidative airspace disease, lung masses or significant pulmonary nodules. Musculoskeletal: Sclerotic exophytic 1.4 cm lesion at the lateral lower right scapula (series 7/image 69) without overtly aggressive features, favor an osteoma. Moderate thoracic spondylosis. Review of the MIP images confirms the above findings. CTA ABDOMEN AND PELVIS FINDINGS VASCULAR Aorta: Normal caliber mildly atherosclerotic aorta without aneurysm, dissection, vasculitis or significant stenosis. Celiac: Patent without evidence of aneurysm, dissection, vasculitis or significant stenosis. SMA: Patent without evidence of aneurysm, dissection, vasculitis or significant stenosis. Renals: Bilateral renal arteries are patent without evidence of aneurysm, dissection, vasculitis, fibromuscular dysplasia or significant stenosis. IMA: Patent without evidence of aneurysm, dissection, vasculitis or significant stenosis. Inflow: Patent without evidence of aneurysm, dissection, vasculitis or significant stenosis. Veins: No obvious venous abnormality within the limitations of this arterial phase study. Review of the MIP images confirms the above findings. NON-VASCULAR Hepatobiliary: Normal liver with no liver mass. Possible tiny gallstone in the gallbladder neck. No gallbladder  wall thickening or pericholecystic fluid. No biliary ductal dilatation. Pancreas: Normal, with no mass or duct dilation. Spleen: Normal size. No mass. Adrenals/Urinary Tract: Normal adrenals. No hydronephrosis. No contour deforming renal masses. Normal bladder. Stomach/Bowel: Normal non-distended stomach. Normal caliber small bowel with no small bowel wall thickening. Appendectomy. Mild left colonic diverticulosis, most prominent in the sigmoid colon, with no large bowel wall thickening or pericolonic fat stranding. Vascular/Lymphatic: Atherosclerotic nonaneurysmal abdominal aorta. No pathologically enlarged lymph nodes in the abdomen or pelvis. Reproductive: Top-normal size prostate. Other: No pneumoperitoneum, ascites or focal fluid collection. Musculoskeletal: No aggressive appearing focal osseous lesions. Nonspecific small sclerotic lesion in the upper left sacrum, most likely a benign bone island. Moderate lumbar spondylosis. Review of the MIP images confirms the above findings. IMPRESSION: 1. No acute aortic syndrome. 2. One vessel coronary atherosclerosis. 3. Possible cholelithiasis.  No evidence of acute  cholecystitis. 4. Mild left colonic diverticulosis. 5.  Aortic Atherosclerosis (ICD10-I70.0). Electronically Signed   By: Ilona Sorrel M.D.   On: 03/18/2018 12:42   US Abdomen Limited Ruq  Result Date: 03/18/2018 CLINICAL DATA:  Chest pain. EXAM: ULTRASOUND ABDOMEN LIMITED RIGHT UPPER QUADRANT COMPARISON:  None. FINDINGS: Gallbladder: No gallstones or wall thickening visualized. No sonographic Murphy sign noted by sonographer. Maximal wall thickness is 1.5 mm, within normal limits Common bile duct: Diameter: 3.7 mm, within normal limits Liver: No focal lesion identified. Within normal limits in parenchymal echogenicity. Portal vein is patent on color Doppler imaging with normal direction of blood flow towards the liver. IMPRESSION: Negative right upper quadrant ultrasound. No acute or focal lesion  explain the patient's chest pain. Electronically Signed   By: San Morelle M.D.   On: 03/18/2018 14:41    EKG: Independently reviewed.  Sinus rhythm at 64 bpm, normal axis, no acute ischemic changes and QTc 438 ms.  Assessment/Plan Principal Problem:   Chest pain Active Problems:   Esophageal reflux   Personal history of colonic polyps   Hyperlipidemia   CKD (chronic kidney disease), stage III ([place])     1. Chest pain: Concern for angina.  CTA chest/abdomen and pelvis: No acute aortic syndrome, no central PE but shows 1 vessel coronary atherosclerosis and aortic atherosclerosis.  Negative RUQ ultrasound.  Lipase and LFTs unremarkable.  Initial EKG and POC troponin x1 Neg. Pain different from his usual heartburn.  Low index of suspicion for other GI etiology and work-up for same thus far negative.  Does not seem musculoskeletal either.  Admit to telemetry for observation.  Cycle troponin.  Repeat EKG in a.m.  Check fasting lipids and A1c.  Continue aspirin, PRN sublingual NTG.  Cardiology consulted and may need further work-up i.e. stress test versus cath.  N.p.o. after midnight. 2. GERD: Continue PPI. 3. HLD: Not on medications.  LDL 141 ~ 2 years ago.  Repeat fasting lipids and consider statins. 4. Colonic (adenomatous and hyperplastic) polyps: Undergoes regular screening colonoscopy with [facility]. 5. Stage III chronic kidney disease: Creatinine probably at baseline/1.4.  Last creatinine was 1.3 in December 2017.  Due to CT contrast that he received and possibility of cath, IV hydration and follow BMP in a.m.  DVT prophylaxis: Lovenox Code Status: Full Family Communication: Discussed in detail with patient's son at bedside.  Updated care and answered questions. Disposition Plan: DC home pending clinical improvement and evaluation. Consults called: Cardiology Admission status: Observation/telemetry.  Severity of Illness: The appropriate patient status for this patient is  OBSERVATION. Observation status is judged to be reasonable and necessary in order to provide the required intensity of service to ensure the patient's safety. The patient's presenting symptoms, physical exam findings, and initial radiographic and laboratory data in the context of their medical condition is felt to place them at decreased risk for further clinical deterioration. Furthermore, it is anticipated that the patient will be medically stable for discharge from the hospital within 2 midnights of admission. The following factors support the patient status of observation.   " The patient's presenting symptoms include acute severe chest pain. " The physical exam findings include stable vital signs and no reproducible chest wall tenderness. " The initial radiographic and laboratory data are POC troponin x1-, EKG without acute findings, CTA chest shows single vessel atherosclerosis without acute aortic syndrome or central PE.      Vernell Leep MD [facility] Pager [phone]  If 7PM-7AM, please contact  night-coverage [email or web address] Password [email or web address]  03/18/2018, 5:18 PM

## 2018-03-18 NOTE — ED Notes (Addendum)
CT called creatinine is resulted, pt is ready for scan. Pt still in extreme pain. Pt is next to go to CT.

## 2018-03-18 NOTE — ED Notes (Signed)
Patient is in CT.

## 2018-03-19 ENCOUNTER — Other Ambulatory Visit: Payer: Self-pay

## 2018-03-19 ENCOUNTER — Observation Stay (HOSPITAL_COMMUNITY): Payer: Medicare Other

## 2018-03-19 DIAGNOSIS — R7303 Prediabetes: Secondary | ICD-10-CM

## 2018-03-19 DIAGNOSIS — I2510 Atherosclerotic heart disease of native coronary artery without angina pectoris: Secondary | ICD-10-CM

## 2018-03-19 DIAGNOSIS — Z85828 Personal history of other malignant neoplasm of skin: Secondary | ICD-10-CM | POA: Diagnosis not present

## 2018-03-19 DIAGNOSIS — E785 Hyperlipidemia, unspecified: Secondary | ICD-10-CM | POA: Diagnosis not present

## 2018-03-19 DIAGNOSIS — R001 Bradycardia, unspecified: Secondary | ICD-10-CM | POA: Diagnosis not present

## 2018-03-19 DIAGNOSIS — Z79899 Other long term (current) drug therapy: Secondary | ICD-10-CM | POA: Diagnosis not present

## 2018-03-19 DIAGNOSIS — I129 Hypertensive chronic kidney disease with stage 1 through stage 4 chronic kidney disease, or unspecified chronic kidney disease: Secondary | ICD-10-CM | POA: Diagnosis present

## 2018-03-19 DIAGNOSIS — E538 Deficiency of other specified B group vitamins: Secondary | ICD-10-CM | POA: Diagnosis not present

## 2018-03-19 DIAGNOSIS — Z6830 Body mass index (BMI) 30.0-30.9, adult: Secondary | ICD-10-CM | POA: Diagnosis not present

## 2018-03-19 DIAGNOSIS — M25511 Pain in right shoulder: Secondary | ICD-10-CM | POA: Diagnosis not present

## 2018-03-19 DIAGNOSIS — I200 Unstable angina: Secondary | ICD-10-CM | POA: Diagnosis present

## 2018-03-19 DIAGNOSIS — I700 Atherosclerosis of aorta: Secondary | ICD-10-CM

## 2018-03-19 DIAGNOSIS — Z886 Allergy status to analgesic agent status: Secondary | ICD-10-CM | POA: Diagnosis not present

## 2018-03-19 DIAGNOSIS — R112 Nausea with vomiting, unspecified: Secondary | ICD-10-CM | POA: Diagnosis not present

## 2018-03-19 DIAGNOSIS — Z86010 Personal history of colonic polyps: Secondary | ICD-10-CM | POA: Diagnosis not present

## 2018-03-19 DIAGNOSIS — N183 Chronic kidney disease, stage 3 (moderate): Secondary | ICD-10-CM | POA: Diagnosis not present

## 2018-03-19 DIAGNOSIS — Z87891 Personal history of nicotine dependence: Secondary | ICD-10-CM | POA: Diagnosis not present

## 2018-03-19 DIAGNOSIS — I25110 Atherosclerotic heart disease of native coronary artery with unstable angina pectoris: Secondary | ICD-10-CM | POA: Diagnosis not present

## 2018-03-19 DIAGNOSIS — K219 Gastro-esophageal reflux disease without esophagitis: Secondary | ICD-10-CM | POA: Diagnosis not present

## 2018-03-19 DIAGNOSIS — E663 Overweight: Secondary | ICD-10-CM | POA: Diagnosis not present

## 2018-03-19 HISTORY — DX: Atherosclerosis of aorta: I70.0

## 2018-03-19 HISTORY — DX: Prediabetes: R73.03

## 2018-03-19 HISTORY — DX: Unstable angina: I20.0

## 2018-03-19 HISTORY — DX: Atherosclerotic heart disease of native coronary artery without angina pectoris: I25.10

## 2018-03-19 LAB — BASIC METABOLIC PANEL
ANION GAP: 8 (ref 5–15)
BUN: 11 mg/dL (ref 8–23)
CALCIUM: 8.6 mg/dL — AB (ref 8.9–10.3)
CO2: 25 mmol/L (ref 22–32)
Chloride: 109 mmol/L (ref 98–111)
Creatinine, Ser: 1.31 mg/dL — ABNORMAL HIGH (ref 0.61–1.24)
GFR calc non Af Amer: 55 mL/min — ABNORMAL LOW (ref >60)
Glucose, Bld: 97 mg/dL (ref 70–99)
Potassium: 4.2 mmol/L (ref 3.5–5.1)
Sodium: 142 mmol/L (ref 135–145)

## 2018-03-19 LAB — LIPID PANEL
CHOL/HDL RATIO: 6.3 ratio
Cholesterol: 207 mg/dL — ABNORMAL HIGH (ref 0–200)
HDL: 33 mg/dL — AB (ref >40)
LDL CALC: 150 mg/dL — AB (ref 0–99)
TRIGLYCERIDES: 120 mg/dL (ref <150)
VLDL: 24 mg/dL (ref 0–40)

## 2018-03-19 LAB — GLUCOSE, CAPILLARY
Glucose-Capillary: 112 mg/dL — ABNORMAL HIGH (ref 70–99)
Glucose-Capillary: 102 mg/dL — ABNORMAL HIGH (ref 70–99)

## 2018-03-19 LAB — ECHOCARDIOGRAM COMPLETE
HEIGHTINCHES: 70 in
WEIGHTICAEL: 3300.8 [oz_av]

## 2018-03-19 LAB — TROPONIN I: Troponin I: <0.03 ng/mL (ref <0.03)

## 2018-03-19 LAB — HIV ANTIBODY (ROUTINE TESTING W REFLEX): HIV Screen 4th Generation wRfx: NONREACTIVE

## 2018-03-19 MED ORDER — CALCIUM CARBONATE ANTACID 500 MG PO CHEW
1.0000 | CHEWABLE_TABLET | Freq: Three times a day (TID) | ORAL | Status: DC | PRN
  Administered 2018-03-19 – 2018-03-20 (×2): 200 mg via ORAL
  Filled 2018-03-19 (×2): qty 1

## 2018-03-19 NOTE — Progress Notes (Signed)
Subjective:  Patient had some vague chest discomfort this morning lasting around 5 to 10 seconds different than what he had yesterday.  He had evidence of atherosclerotic disease on his CT scan with calcification in the LAD.  Agree with Dr. Angelena Form with suggestive history that catheterization would be the best way to proceed.  Objective:  Vital Signs in the last 24 hours: BP 110/75   Pulse (!) 57   Temp 98.4 F (36.9 C) (Oral)   Resp 17   Ht 5\' 10"  (1.778 m)   Wt 97.5 kg   SpO2 99%   BMI 30.85 kg/m   Physical Exam: Pleasant male in no acute distress Lungs:  Clear Cardiac:  Regular rhythm, normal S1 and S2, no S3 Abdomen:  Soft, nontender, no masses Extremities:  No edema present  Intake/Output from previous day: 09/20 0701 - 09/21 0700 In: 715.2 [P.O.:240; I.V.:475.2] Out: -   Weight Filed Weights   03/18/18 1705  Weight: 97.5 kg    Lab Results: Basic Metabolic Panel: Recent Labs    03/18/18 1127  03/18/18 1207 03/19/18 0619  NA 141  --  143 142  K 4.2  --  4.1 4.2  CL 110  --  107 109  CO2 20*  --   --  25  GLUCOSE 130*  --  121* 97  BUN 13  --  15 11  CREATININE 1.40*   < > 1.40* 1.31*   < > = values in this interval not displayed.   CBC: Recent Labs    03/18/18 1127 03/18/18 1207  WBC 7.0  --   HGB 12.9* 13.3  HCT 42.2 39.0  MCV 82.7  --   PLT 181  --    Cardiac Enzymes: Troponin (Point of Care Test) Recent Labs    03/18/18 1144  TROPIPOC 0.00   Cardiac Panel (last 3 results) Recent Labs    03/18/18 1735 03/18/18 2352 03/19/18 0619  TROPONINI <0.03 <0.03 <0.03    Telemetry: Sinus rhythm  Assessment/Plan:  1.  Chest discomfort consistent with unstable angina 2.  CAD as manifested by coronary calcification 3.  Hyperlipidemia 4.  Prior tobacco abuse  Recommendations:  I would not pursue stress testing at this point.  We will go ahead and let him eat and plan to pursue catheterization on Monday.  Troponins negative so far.  EKG  abnormalities have improved.     Kerry Hough  MD [facility]  03/19/2018, 10:14 AM

## 2018-03-19 NOTE — Progress Notes (Signed)
  Echocardiogram 2D Echocardiogram has been performed.  Anthony King 03/19/2018, 2:52 PM

## 2018-03-19 NOTE — Progress Notes (Signed)
PROGRESS NOTE    Anthony King  TDV:[id number] DOB: Aug 02, 1950 DOA: 03/18/2018 PCP: Ann Held, DO   Brief Narrative:  67 year old man with a history of CKD stage III, GERD, anemia, remote tobacco abuse, presenting to the emergency department, after developing severe chest pain while working on his alcohol history.  This was described as very heavy pressure on the substernal area, associated with dyspnea.  Patient took 4 aspirins, and called EMS.  He received sublingual nitroglycerin by EMS, but this did not relieve the pain.  He became nauseous, and had several episodes of vomiting.  He received morphine, with some relief after 3 hours.  CT Angie of the chest was negative for PE, but was noted to have 1 vessel coronary atherosclerosis.  Ultrasound of the abdomen taken for possible cholelithiasis, was negative.  Cardiology evaluated the patient, and in view of risk factors for CAD, including hyperlipidemia and former tobacco abuse, and clinical presentation for unstable angina, without known CAD, he is being admitted, for the management of his symptoms, and for cardiac catheterization for further visualization planned for Monday 03/21/2018.  troponin x3 were negative.  EKG shows subtle abnormalities.  2D echo is pending.  Appreciate cardiology follow-up.  Assessment & Plan:   Principal Problem:   Chest pain Active Problems:   Esophageal reflux   Personal history of colonic polyps   Hyperlipidemia   CKD (chronic kidney disease), stage III (HCC)   CAD (coronary artery disease), native coronary artery   Aortic atherosclerosis (HCC)   Prediabetes  Unstable angina.  Patient has no known CAD.  Risk factors include hyperlipidemia, former tobacco abuse.  Troponins negative.  EKG with subtle abnormalities.  CT angios negative for acute aortic syndrome, or central PE.  He does show one-vessel coronary atherosclerosis and aortic atherosclerosis.  2D echo is pending.   For cardiac  catheterization planned for 03/21/2018.   Appreciate cardiology follow-up. Morphine as needed-NTG as needed. Patient is intolerant to aspirin, which causes nausea. Lovenox daily.  GERD,  continue Protonix 40 mg daily  Hyperlipidemia, lipid profile on 03/19/2018 shows total cholesterol 207, HDL 33, LDL 150, triglycerides 120. Likely to start on statin by cardiology.  Appreciate their involvement.  Chronic kidney disease stage 3 Current Cr is 1.31 at baseline Lab Results  Component Value Date   CREATININE 1.31 (H) 03/19/2018   CREATININE 1.40 (H) 03/18/2018   CREATININE 1.40 (H) 03/18/2018    Repeat BMET in am    Prediabetes.  The patient is hemoglobin A1c is 5.7. Due to monitored CBG twice daily Heart healthy, carb modified diet.     DVT prophylaxis: Lovenox Code Status: Full code  Family Communication: None Disposition Plan: To home if stable from the cardiology standpoint, after cardiac catheterization.  Will defer to cardiology when the patient is deemed ready to be discharged.  Consultants:   Cardiology, Dr. Angelena Form, Dr. Wynonia Lawman.  The patient is to undergo cardiac catheterization on 03/21/2018, for unstable angina.  Procedures:  2D echo, results pending  Antimicrobials:   None   Subjective: Patient has intermittent episodes of substernal chest pain, without radiation.  These episodes last a few minutes, and he has had 7 out of 10 while hospitalized.  He denies any nausea or vomiting, and pelvis are different from my reflux ".  He denies any shortness of breath, or cough.  He denies any fever or chills.  He denies any nausea or vomiting or diaphoresis.  He denies any unilateral weakness, or numbness or  tingling.  He denies any calf pain.  No lower extremity edema.  Objective: Vitals:   03/19/18 0259 03/19/18 0313 03/19/18 0538 03/19/18 1137  BP:   110/75 (!) 154/79  Pulse:   (!) 57 (!) 53  Resp:   17 17  Temp: 98.9 F (37.2 C) 98.3 F (36.8 C) 98.4 F (36.9 C)  98 F (36.7 C)  TempSrc: Oral  Oral Oral  SpO2:   99% 100%  Weight:      Height:        Intake/Output Summary (Last 24 hours) at 03/19/2018 1221 Last data filed at 03/19/2018 0600 Gross per 24 hour  Intake 715.22 ml  Output -  Net 715.22 ml   Filed Weights   03/18/18 1705  Weight: 97.5 kg    Examination:  General exam: Appears calm and comfortable  Respiratory system: Clear to auscultation. Respiratory effort normal. Cardiovascular system: S1 & S2 heard, RRR. No JVD, murmurs, rubs, gallops or clicks. No pedal edema. Gastrointestinal system: Abdomen is nondistended, soft and nontender. No organomegaly or masses felt. Normal bowel sounds heard. Central nervous system: Alert and oriented. No focal neurological deficits. Extremities: Symmetric 5 x 5 power. Skin: No rashes, lesions or ulcers Psychiatry: Judgement and insight appear normal. Mood & affect appropriate.     Data Reviewed: I have personally reviewed following labs and imaging studies  CBC: Recent Labs  Lab 03/18/18 1127 03/18/18 1207  WBC 7.0  --   HGB 12.9* 13.3  HCT 42.2 39.0  MCV 82.7  --   PLT 181  --    Basic Metabolic Panel: Recent Labs  Lab 03/18/18 1127 03/18/18 1145 03/18/18 1207 03/19/18 0619  NA 141  --  143 142  K 4.2  --  4.1 4.2  CL 110  --  107 109  CO2 20*  --   --  25  GLUCOSE 130*  --  121* 97  BUN 13  --  15 11  CREATININE 1.40* 1.40* 1.40* 1.31*  CALCIUM 8.9  --   --  8.6*   GFR: Estimated Creatinine Clearance: 64.1 mL/min (A) (by C-G formula based on SCr of 1.31 mg/dL (H)). Liver Function Tests: Recent Labs  Lab 03/18/18 1127  AST 28  ALT 21  ALKPHOS 76  BILITOT 1.0  PROT 5.7*  ALBUMIN 3.4*   Recent Labs  Lab 03/18/18 1127  LIPASE 29   No results for input(s): AMMONIA in the last 168 hours. Coagulation Profile: Recent Labs  Lab 03/18/18 1139  INR 1.01   Cardiac Enzymes: Recent Labs  Lab 03/18/18 1735 03/18/18 2352 03/19/18 0619  TROPONINI <0.03  <0.03 <0.03   BNP (last 3 results) No results for input(s): PROBNP in the last 8760 hours. HbA1C: Recent Labs    03/18/18 1735  HGBA1C 5.7*   CBG: No results for input(s): GLUCAP in the last 168 hours. Lipid Profile: Recent Labs    03/19/18 0619  CHOL 207*  HDL 33*  LDLCALC 150*  TRIG 120  CHOLHDL 6.3   Thyroid Function Tests: No results for input(s): TSH, T4TOTAL, FREET4, T3FREE, THYROIDAB in the last 72 hours. Anemia Panel: No results for input(s): VITAMINB12, FOLATE, FERRITIN, TIBC, IRON, RETICCTPCT in the last 72 hours. Sepsis Labs: No results for input(s): PROCALCITON, LATICACIDVEN in the last 168 hours.  No results found for this or any previous visit (from the past 240 hour(s)).       Radiology Studies: Dg Chest Portable 1 View  Result Date: 03/18/2018 CLINICAL DATA:  Chest pain. EXAM: PORTABLE CHEST 1 VIEW COMPARISON:  Rib series 10/12/2016. FINDINGS: Mediastinum and hilar structures normal. Heart size stable. Lungs are clear. Small right pleural effusion. Heart size normal. No acute bony abnormality. IMPRESSION: 1.  Small right pleural effusion. 2.  Exam is otherwise unremarkable. Electronically Signed   By: Marcello Moores  Register   On: 03/18/2018 11:45   Ct Angio Chest/abd/pel For Dissection W And/or Wo Contrast  Result Date: 03/18/2018 CLINICAL DATA:  Sudden onset chest pain radiating to the back. EXAM: CT ANGIOGRAPHY CHEST, ABDOMEN AND PELVIS TECHNIQUE: Multidetector CT imaging through the chest, abdomen and pelvis was performed using the standard protocol during bolus administration of intravenous contrast. Multiplanar reconstructed images and MIPs were obtained and reviewed to evaluate the vascular anatomy. CONTRAST:  133mL ISOVUE-370 IOPAMIDOL (ISOVUE-370) INJECTION 76% COMPARISON:  Chest radiograph from earlier today. FINDINGS: CTA CHEST FINDINGS Cardiovascular: Normal heart size. No significant pericardial effusion/thickening. Left anterior descending coronary  atherosclerosis. Normal course and caliber of the mildly atherosclerotic thoracic aorta. No evidence of acute intramural hematoma, dissection, pseudoaneurysm or penetrating atherosclerotic ulcer in the thoracic aorta. Patent aortic arch branch vessels. Normal caliber pulmonary arteries. No central pulmonary emboli. Mediastinum/Nodes: No discrete thyroid nodules. Unremarkable esophagus. No pathologically enlarged axillary, mediastinal or hilar lymph nodes. Lungs/Pleura: No pneumothorax. No pleural effusion. No acute consolidative airspace disease, lung masses or significant pulmonary nodules. Musculoskeletal: Sclerotic exophytic 1.4 cm lesion at the lateral lower right scapula (series 7/image 69) without overtly aggressive features, favor an osteoma. Moderate thoracic spondylosis. Review of the MIP images confirms the above findings. CTA ABDOMEN AND PELVIS FINDINGS VASCULAR Aorta: Normal caliber mildly atherosclerotic aorta without aneurysm, dissection, vasculitis or significant stenosis. Celiac: Patent without evidence of aneurysm, dissection, vasculitis or significant stenosis. SMA: Patent without evidence of aneurysm, dissection, vasculitis or significant stenosis. Renals: Bilateral renal arteries are patent without evidence of aneurysm, dissection, vasculitis, fibromuscular dysplasia or significant stenosis. IMA: Patent without evidence of aneurysm, dissection, vasculitis or significant stenosis. Inflow: Patent without evidence of aneurysm, dissection, vasculitis or significant stenosis. Veins: No obvious venous abnormality within the limitations of this arterial phase study. Review of the MIP images confirms the above findings. NON-VASCULAR Hepatobiliary: Normal liver with no liver mass. Possible tiny gallstone in the gallbladder neck. No gallbladder wall thickening or pericholecystic fluid. No biliary ductal dilatation. Pancreas: Normal, with no mass or duct dilation. Spleen: Normal size. No mass.  Adrenals/Urinary Tract: Normal adrenals. No hydronephrosis. No contour deforming renal masses. Normal bladder. Stomach/Bowel: Normal non-distended stomach. Normal caliber small bowel with no small bowel wall thickening. Appendectomy. Mild left colonic diverticulosis, most prominent in the sigmoid colon, with no large bowel wall thickening or pericolonic fat stranding. Vascular/Lymphatic: Atherosclerotic nonaneurysmal abdominal aorta. No pathologically enlarged lymph nodes in the abdomen or pelvis. Reproductive: Top-normal size prostate. Other: No pneumoperitoneum, ascites or focal fluid collection. Musculoskeletal: No aggressive appearing focal osseous lesions. Nonspecific small sclerotic lesion in the upper left sacrum, most likely a benign bone island. Moderate lumbar spondylosis. Review of the MIP images confirms the above findings. IMPRESSION: 1. No acute aortic syndrome. 2. One vessel coronary atherosclerosis. 3. Possible cholelithiasis.  No evidence of acute cholecystitis. 4. Mild left colonic diverticulosis. 5.  Aortic Atherosclerosis (ICD10-I70.0). Electronically Signed   By: Ilona Sorrel M.D.   On: 03/18/2018 12:42   US Abdomen Limited Ruq  Result Date: 03/18/2018 CLINICAL DATA:  Chest pain. EXAM: ULTRASOUND ABDOMEN LIMITED RIGHT UPPER QUADRANT COMPARISON:  None. FINDINGS: Gallbladder: No gallstones or wall thickening visualized. No sonographic Percell Miller  sign noted by sonographer. Maximal wall thickness is 1.5 mm, within normal limits Common bile duct: Diameter: 3.7 mm, within normal limits Liver: No focal lesion identified. Within normal limits in parenchymal echogenicity. Portal vein is patent on color Doppler imaging with normal direction of blood flow towards the liver. IMPRESSION: Negative right upper quadrant ultrasound. No acute or focal lesion explain the patient's chest pain. Electronically Signed   By: San Morelle M.D.   On: 03/18/2018 14:41        Scheduled Meds: . aspirin  325  mg Oral Daily  . enoxaparin (LOVENOX) injection  40 mg Subcutaneous Q24H  . multivitamin with minerals  1 tablet Oral Daily  . pantoprazole  40 mg Oral Daily  . sodium chloride flush  3 mL Intravenous Q12H  . vitamin B-12  1,000 mcg Oral Daily   Continuous Infusions:   LOS: 0 days        Sharene Butters, MD [facility] Pager [phone] or text via [place] or Amion  If 7PM-7AM, please contact night-coverage [email or web address] Password [facility] 03/19/2018, 12:21 PM

## 2018-03-19 NOTE — Progress Notes (Signed)
Pt reports 4/10 chest pain. Describes it as sharp and sudden lasting 30 seconds. Pt non symptomatic at this time rates pain 0/10. Vitals documented and EKG completed. Paged MD to inform

## 2018-03-19 NOTE — Progress Notes (Signed)
MD aware, no new orders  Will continue to monitor  

## 2018-03-19 NOTE — Plan of Care (Signed)
Pt alert and oriented.Verbalized understanding of care plan. Pt able to repeat risk factors for cardiac conditions. Diagnotic tests are improving. Pt able to remain chest pain free during shift. Maintained adequate nutrition during stay. Remained free of injury. Pt progressing towards discharge.

## 2018-03-20 DIAGNOSIS — N183 Chronic kidney disease, stage 3 (moderate): Secondary | ICD-10-CM

## 2018-03-20 DIAGNOSIS — K219 Gastro-esophageal reflux disease without esophagitis: Secondary | ICD-10-CM

## 2018-03-20 LAB — GLUCOSE, CAPILLARY
GLUCOSE-CAPILLARY: 66 mg/dL — AB (ref 70–99)
GLUCOSE-CAPILLARY: 96 mg/dL (ref 70–99)
Glucose-Capillary: 92 mg/dL (ref 70–99)

## 2018-03-20 MED ORDER — ASPIRIN 81 MG PO CHEW
81.0000 mg | CHEWABLE_TABLET | ORAL | Status: AC
  Administered 2018-03-21: 81 mg via ORAL
  Filled 2018-03-20: qty 1

## 2018-03-20 MED ORDER — ATORVASTATIN CALCIUM 40 MG PO TABS
40.0000 mg | ORAL_TABLET | Freq: Every day | ORAL | Status: DC
  Administered 2018-03-20 – 2018-03-21 (×2): 40 mg via ORAL
  Filled 2018-03-20 (×2): qty 1

## 2018-03-20 MED ORDER — BISACODYL 10 MG RE SUPP
10.0000 mg | Freq: Every day | RECTAL | Status: DC | PRN
  Administered 2018-03-20: 10 mg via RECTAL
  Filled 2018-03-20: qty 1

## 2018-03-20 MED ORDER — SODIUM CHLORIDE 0.9 % IV SOLN: 250.0000 mL | INTRAVENOUS | Status: DC | PRN

## 2018-03-20 MED ORDER — SODIUM CHLORIDE 0.9 % IV SOLN
INTRAVENOUS | Status: DC
  Administered 2018-03-21 (×2): via INTRAVENOUS

## 2018-03-20 MED ORDER — SODIUM CHLORIDE 0.9% FLUSH
3.0000 mL | Freq: Two times a day (BID) | INTRAVENOUS | Status: DC
  Administered 2018-03-20 – 2018-03-21 (×2): 3 mL via INTRAVENOUS

## 2018-03-20 MED ORDER — SODIUM CHLORIDE 0.9% FLUSH: 3.0000 mL | INTRAVENOUS | Status: DC | PRN

## 2018-03-20 NOTE — Progress Notes (Signed)
Pt blood sugar 66 Pt alert and oriented  Provided orange juice and crackers with peanut butter  Will re check CBG in 15 minutes

## 2018-03-20 NOTE — Progress Notes (Signed)
Subjective:  He complained of one episode of sharp chest pain that lasted less than 5 to 10 seconds yesterday.  Complained of vague chest discomfort this morning does not sound like angina.  Nothing like what he had when he came in.  Objective:  Vital Signs in the last 24 hours: BP 130/77 (BP Location: Right Arm)   Pulse (!) 56   Temp 97.6 F (36.4 C) (Oral)   Resp 18   Ht 5\' 10"  (1.778 m)   Wt 93.3 kg   SpO2 98%   BMI 29.50 kg/m   Physical Exam: Pleasant male in no acute distress Lungs:  Clear Cardiac:  Regular rhythm, normal S1 and S2, no S3 Abdomen:  Soft, nontender, no masses Extremities:  No edema present  Intake/Output from previous day: 09/21 0701 - 09/22 0700 In: 600 [P.O.:600] Out: 1851 [Urine:1851]  Weight Filed Weights   03/18/18 1705 03/19/18 1301 03/20/18 0511  Weight: 97.5 kg 93.6 kg 93.3 kg    Lab Results: Basic Metabolic Panel: Recent Labs    03/18/18 1127  03/18/18 1207 03/19/18 0619  NA 141  --  143 142  K 4.2  --  4.1 4.2  CL 110  --  107 109  CO2 20*  --   --  25  GLUCOSE 130*  --  121* 97  BUN 13  --  15 11  CREATININE 1.40*   < > 1.40* 1.31*   < > = values in this interval not displayed.   CBC: Recent Labs    03/18/18 1127 03/18/18 1207  WBC 7.0  --   HGB 12.9* 13.3  HCT 42.2 39.0  MCV 82.7  --   PLT 181  --    Cardiac Enzymes: Troponin (Point of Care Test) Recent Labs    03/18/18 1144  TROPIPOC 0.00   Cardiac Panel (last 3 results) Recent Labs    03/18/18 1735 03/18/18 2352 03/19/18 0619  TROPONINI <0.03 <0.03 <0.03    Telemetry: Sinus rhythm  Assessment/Plan:  1.  Chest discomfort consistent with unstable angina 2.  CAD as manifested by coronary calcification 3.  Hyperlipidemia 4.  Prior tobacco abuse  Recommendations:  Catheterization is planned for tomorrow.  Cardiac catheterization procedure was discussed with the patient fully including risks of myocardial infarction, death, stroke, bleeding,  arrhythmia, dye allergy, or renal insufficiency. The patient understands and is willing to proceed.     Kerry Hough  MD [facility]  03/20/2018, 10:31 AM

## 2018-03-20 NOTE — Progress Notes (Signed)
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  Patient was in his usual state of health until 10:30 AM on day of admission when he was standing at his work desk and talking to his sister on the phone when he experienced sudden onset of right lower parasternal chest pain, sharp, 10/10 in intensity, went through and through to between his shoulder blades, associated with diaphoresis and flushing but no nausea.  He thought that this may have been due to "gas" and took 2 Tums and drank some warm Canadian dry without relief.  His family then called EMS.  He was given 4 baby aspirins to chew and sublingual nitroglycerin which did not help his chest pain but he had 3 episodes of nonbloody emesis.  Chest pain continued on ED arrival and apparently required 3 doses of IV pain medications (morphine/Dilaudid) and eventually pain resolved after 3 hours.   Plan is for heart cath on 9/23. Continue ASA, statins and lovenox for DVT prophylaxis.   Assessment & Plan: 1-chest pain/unstable angina: -Patient reported to be intolerable to aspirin; but so far tolerating it.  -Start statins -No beta-blocker given soft blood pressure and ongoing bradycardia. -Continue PRN nitroglycerin and also PRN oxygen supplementation. -Currently chest pain-free.  2-Esophageal reflux -Continue PPI.  3-Hyperlipidemia -Started on Lipitor 40 mg by mouth daily -LDL 150.  4-CKD (chronic kidney disease), stage III (HCC) -Creatinine stable and at baseline. -Follow renal function trend.  5-B12 deficiency    -Hgb is normal -continue oral maintenance supplementation   DVT prophylaxis: Lovenox. Code Status: Full code Family Communication: Wife at bedside. Disposition Plan: Remains in the hospital, heart cath in am; continue PRN NTG and start statins. Follow cardiology rec's.   Consultants:   Cardiology   Procedures:   See below for x-ray reports.  2D echo: - Left ventricle: The cavity size was normal. Wall thickness was   increased in a pattern of mild LVH. Systolic function was normal.   The estimated ejection fraction was in the range of 55% to 60%.   Wall motion was normal; there were no regional wall motion   abnormalities. - Aortic valve: There was mild to moderate regurgitation. - Pulmonary arteries: Systolic pressure was mildly increased. PA   peak pressure: 35 mm Hg (S).  Heart cath: planned for 03/21/18   Antimicrobials:  Anti-infectives (From admission, onward)   None      Subjective: Afebrile, no shortness of breath, no palpitations, no nausea or vomiting.  Reported mild chest discomfort earlier this morning that resolved on its own.  Patient in no acute distress having breakfast and is present being ready for cardiac cath.  Objective: Vitals:   03/19/18 1545 03/19/18 2026 03/19/18 2320 03/20/18 0511  BP: (!) 141/80 138/78 111/81 130/77  Pulse: 61 64 67 (!) 56  Resp: 18 18 18 18   Temp: 97.7 F (36.5 C) 98.4 F (36.9 C) 98.1 F (36.7 C) 97.6 F (36.4 C)  TempSrc: Oral Oral Oral Oral  SpO2: 100% 99% 99% 98%  Weight:    93.3 kg  Height:  Intake/Output Summary (Last 24 hours) at 03/20/2018 1120 Last data filed at 03/20/2018 0942 Gross per 24 hour  Intake 960 ml  Output 1851 ml  Net -891 ml   Filed Weights   03/18/18 1705 03/19/18 1301 03/20/18 0511  Weight: 97.5 kg 93.6 kg 93.3 kg    Examination: General exam: Alert, awake, oriented x 3; denies palpitations or shortness of breath.  Patient reports one episode of sharp chest discomfort that  lasted approximately 5 to 10 seconds and resolve on his own.  No nausea, no vomiting, no diaphoresis and no abdominal pain. Respiratory system: Clear to auscultation. Respiratory effort normal. Cardiovascular system:RRR. No murmurs, rubs, gallops. Gastrointestinal system: Abdomen is nondistended, soft and nontender. No organomegaly or masses felt. Normal bowel sounds heard. Central nervous system: Alert and oriented. No focal neurological deficits. Extremities: No C/C/E, +pedal pulses Skin: No rashes, lesions or ulcers Psychiatry: Judgement and insight appear normal. Mood & affect appropriate.     Data Reviewed: I have personally reviewed following labs and imaging studies  CBC: Recent Labs  Lab 03/18/18 1127 03/18/18 1207  WBC 7.0  --   HGB 12.9* 13.3  HCT 42.2 39.0  MCV 82.7  --   PLT 181  --    Basic Metabolic Panel: Recent Labs  Lab 03/18/18 1127 03/18/18 1145 03/18/18 1207 03/19/18 0619  NA 141  --  143 142  K 4.2  --  4.1 4.2  CL 110  --  107 109  CO2 20*  --   --  25  GLUCOSE 130*  --  121* 97  BUN 13  --  15 11  CREATININE 1.40* 1.40* 1.40* 1.31*  CALCIUM 8.9  --   --  8.6*   GFR: Estimated Creatinine Clearance: 62.8 mL/min (A) (by C-G formula based on SCr of 1.31 mg/dL (H)).   Liver Function Tests: Recent Labs  Lab 03/18/18 1127  AST 28  ALT 21  ALKPHOS 76  BILITOT 1.0  PROT 5.7*  ALBUMIN 3.4*   Recent Labs  Lab 03/18/18 1127  LIPASE 29   Coagulation Profile: Recent Labs  Lab 03/18/18 1139  INR 1.01   Cardiac Enzymes: Recent Labs  Lab 03/18/18 1735 03/18/18 2352 03/19/18 0619  TROPONINI <0.03 <0.03 <0.03   HbA1C: Recent Labs    03/18/18 1735  HGBA1C 5.7*   CBG: Recent Labs  Lab 03/19/18 1542 03/19/18 2324 03/20/18 0810  GLUCAP 112* 102* 92   Lipid Profile: Recent Labs    03/19/18 0619  CHOL 207*  HDL 33*  LDLCALC 150*  TRIG 120  CHOLHDL 6.3   Urine analysis:    Component Value Date/Time   COLORURINE YELLOW  07/17/2011 0900   APPEARANCEUR CLEAR 07/17/2011 0900   LABSPEC 1.020 07/17/2011 0900   PHURINE 6.5 07/17/2011 0900   GLUCOSEU NEGATIVE 07/17/2011 0900   HGBUR NEGATIVE 07/17/2011 0900   BILIRUBINUR neg 06/11/2016 1701   KETONESUR NEGATIVE 07/17/2011 0900   PROTEINUR neg 06/11/2016 1701   PROTEINUR NEGATIVE 07/17/2011 0900   UROBILINOGEN 0.2 06/11/2016 1701   UROBILINOGEN 0.2 07/17/2011 0900   NITRITE neg 06/11/2016 1701   NITRITE NEGATIVE 07/17/2011 0900   LEUKOCYTESUR Negative 06/11/2016 1701   Radiology Studies: Dg Chest Portable 1 View  Result Date: 03/18/2018 CLINICAL DATA:  Chest pain. EXAM: PORTABLE CHEST 1 VIEW COMPARISON:  Rib series 10/12/2016. FINDINGS: Mediastinum and hilar structures normal. Heart size stable. Lungs are clear. Small right pleural effusion. Heart size normal. No acute bony abnormality. IMPRESSION: 1.  Small  right pleural effusion. 2.  Exam is otherwise unremarkable. Electronically Signed   By: Marcello Moores  Register   On: 03/18/2018 11:45   Ct Angio Chest/abd/pel For Dissection W And/or Wo Contrast  Result Date: 03/18/2018 CLINICAL DATA:  Sudden onset chest pain radiating to the back. EXAM: CT ANGIOGRAPHY CHEST, ABDOMEN AND PELVIS TECHNIQUE: Multidetector CT imaging through the chest, abdomen and pelvis was performed using the standard protocol during bolus administration of intravenous contrast. Multiplanar reconstructed images and MIPs were obtained and reviewed to evaluate the vascular anatomy. CONTRAST:  172mL ISOVUE-370 IOPAMIDOL (ISOVUE-370) INJECTION 76% COMPARISON:  Chest radiograph from earlier today. FINDINGS: CTA CHEST FINDINGS Cardiovascular: Normal heart size. No significant pericardial effusion/thickening. Left anterior descending coronary atherosclerosis. Normal course and caliber of the mildly atherosclerotic thoracic aorta. No evidence of acute intramural hematoma, dissection, pseudoaneurysm or penetrating atherosclerotic ulcer in the thoracic aorta.  Patent aortic arch branch vessels. Normal caliber pulmonary arteries. No central pulmonary emboli. Mediastinum/Nodes: No discrete thyroid nodules. Unremarkable esophagus. No pathologically enlarged axillary, mediastinal or hilar lymph nodes. Lungs/Pleura: No pneumothorax. No pleural effusion. No acute consolidative airspace disease, lung masses or significant pulmonary nodules. Musculoskeletal: Sclerotic exophytic 1.4 cm lesion at the lateral lower right scapula (series 7/image 69) without overtly aggressive features, favor an osteoma. Moderate thoracic spondylosis. Review of the MIP images confirms the above findings. CTA ABDOMEN AND PELVIS FINDINGS VASCULAR Aorta: Normal caliber mildly atherosclerotic aorta without aneurysm, dissection, vasculitis or significant stenosis. Celiac: Patent without evidence of aneurysm, dissection, vasculitis or significant stenosis. SMA: Patent without evidence of aneurysm, dissection, vasculitis or significant stenosis. Renals: Bilateral renal arteries are patent without evidence of aneurysm, dissection, vasculitis, fibromuscular dysplasia or significant stenosis. IMA: Patent without evidence of aneurysm, dissection, vasculitis or significant stenosis. Inflow: Patent without evidence of aneurysm, dissection, vasculitis or significant stenosis. Veins: No obvious venous abnormality within the limitations of this arterial phase study. Review of the MIP images confirms the above findings. NON-VASCULAR Hepatobiliary: Normal liver with no liver mass. Possible tiny gallstone in the gallbladder neck. No gallbladder wall thickening or pericholecystic fluid. No biliary ductal dilatation. Pancreas: Normal, with no mass or duct dilation. Spleen: Normal size. No mass. Adrenals/Urinary Tract: Normal adrenals. No hydronephrosis. No contour deforming renal masses. Normal bladder. Stomach/Bowel: Normal non-distended stomach. Normal caliber small bowel with no small bowel wall thickening.  Appendectomy. Mild left colonic diverticulosis, most prominent in the sigmoid colon, with no large bowel wall thickening or pericolonic fat stranding. Vascular/Lymphatic: Atherosclerotic nonaneurysmal abdominal aorta. No pathologically enlarged lymph nodes in the abdomen or pelvis. Reproductive: Top-normal size prostate. Other: No pneumoperitoneum, ascites or focal fluid collection. Musculoskeletal: No aggressive appearing focal osseous lesions. Nonspecific small sclerotic lesion in the upper left sacrum, most likely a benign bone island. Moderate lumbar spondylosis. Review of the MIP images confirms the above findings. IMPRESSION: 1. No acute aortic syndrome. 2. One vessel coronary atherosclerosis. 3. Possible cholelithiasis.  No evidence of acute cholecystitis. 4. Mild left colonic diverticulosis. 5.  Aortic Atherosclerosis (ICD10-I70.0). Electronically Signed   By: Ilona Sorrel M.D.   On: 03/18/2018 12:42   US Abdomen Limited Ruq  Result Date: 03/18/2018 CLINICAL DATA:  Chest pain. EXAM: ULTRASOUND ABDOMEN LIMITED RIGHT UPPER QUADRANT COMPARISON:  None. FINDINGS: Gallbladder: No gallstones or wall thickening visualized. No sonographic Murphy sign noted by sonographer. Maximal wall thickness is 1.5 mm, within normal limits Common bile duct: Diameter: 3.7 mm, within normal limits Liver: No focal lesion identified. Within normal limits in parenchymal echogenicity. Portal vein is patent on color  Doppler imaging with normal direction of blood flow towards the liver. IMPRESSION: Negative right upper quadrant ultrasound. No acute or focal lesion explain the patient's chest pain. Electronically Signed   By: San Morelle M.D.   On: 03/18/2018 14:41   Scheduled Meds: . aspirin  325 mg Oral Daily  . enoxaparin (LOVENOX) injection  40 mg Subcutaneous Q24H  . multivitamin with minerals  1 tablet Oral Daily  . pantoprazole  40 mg Oral Daily  . sodium chloride flush  3 mL Intravenous Q12H  . vitamin B-12   1,000 mcg Oral Daily   Continuous Infusions:   LOS: 1 day    Time spent: 30 minutes.   Barton Dubois, MD [facility] Pager [phone]  If 7PM-7AM, please contact night-coverage [email or web address] Password [facility] 03/20/2018, 11:20 AM

## 2018-03-20 NOTE — Progress Notes (Signed)
Pt requesting suppository  Paged MD

## 2018-03-20 NOTE — Progress Notes (Signed)
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

## 2018-03-21 ENCOUNTER — Encounter (HOSPITAL_COMMUNITY)
Admission: EM | Disposition: A | Discharge status: Home / Self Care | Payer: Self-pay | Source: Home / Self Care | Attending: Internal Medicine

## 2018-03-21 DIAGNOSIS — R7303 Prediabetes: Secondary | ICD-10-CM

## 2018-03-21 DIAGNOSIS — I700 Atherosclerosis of aorta: Secondary | ICD-10-CM

## 2018-03-21 DIAGNOSIS — I25110 Atherosclerotic heart disease of native coronary artery with unstable angina pectoris: Principal | ICD-10-CM

## 2018-03-21 DIAGNOSIS — E785 Hyperlipidemia, unspecified: Secondary | ICD-10-CM

## 2018-03-21 HISTORY — PX: LEFT HEART CATH AND CORONARY ANGIOGRAPHY: CATH118249

## 2018-03-21 LAB — BASIC METABOLIC PANEL
ANION GAP: 6 (ref 5–15)
BUN: 16 mg/dL (ref 8–23)
CHLORIDE: 107 mmol/L (ref 98–111)
CO2: 26 mmol/L (ref 22–32)
Calcium: 8.9 mg/dL (ref 8.9–10.3)
Creatinine, Ser: 1.32 mg/dL — ABNORMAL HIGH (ref 0.61–1.24)
GFR calc Af Amer: >60 mL/min (ref >60)
GFR, EST NON AFRICAN AMERICAN: 54 mL/min — AB (ref >60)
Glucose, Bld: 104 mg/dL — ABNORMAL HIGH (ref 70–99)
POTASSIUM: 4.1 mmol/L (ref 3.5–5.1)
SODIUM: 139 mmol/L (ref 135–145)

## 2018-03-21 LAB — GLUCOSE, CAPILLARY: GLUCOSE-CAPILLARY: 90 mg/dL (ref 70–99)

## 2018-03-21 SURGERY — LEFT HEART CATH AND CORONARY ANGIOGRAPHY
Anesthesia: LOCAL

## 2018-03-21 MED ORDER — MIDAZOLAM HCL 2 MG/2ML IJ SOLN
INTRAMUSCULAR | Status: DC | PRN
  Administered 2018-03-21: 2 mg via INTRAVENOUS

## 2018-03-21 MED ORDER — SODIUM CHLORIDE 0.9% FLUSH: 3.0000 mL | Freq: Two times a day (BID) | INTRAVENOUS | Status: DC

## 2018-03-21 MED ORDER — IOHEXOL 350 MG/ML SOLN
INTRAVENOUS | Status: DC | PRN
  Administered 2018-03-21: 45 mL via INTRA_ARTERIAL

## 2018-03-21 MED ORDER — SODIUM CHLORIDE 0.9 % IV SOLN: 250.0000 mL | INTRAVENOUS | Status: DC | PRN

## 2018-03-21 MED ORDER — SODIUM CHLORIDE 0.9 % IV SOLN
INTRAVENOUS | Status: DC
  Administered 2018-03-21: 16:00:00 via INTRAVENOUS

## 2018-03-21 MED ORDER — HEPARIN (PORCINE) IN NACL 1000-0.9 UT/500ML-% IV SOLN
INTRAVENOUS | Status: AC
  Filled 2018-03-21: qty 1000

## 2018-03-21 MED ORDER — MIDAZOLAM HCL 2 MG/2ML IJ SOLN
INTRAMUSCULAR | Status: AC
  Filled 2018-03-21: qty 2

## 2018-03-21 MED ORDER — SODIUM CHLORIDE 0.9% FLUSH: 3.0000 mL | INTRAVENOUS | Status: DC | PRN

## 2018-03-21 MED ORDER — LIDOCAINE HCL (PF) 1 % IJ SOLN
INTRAMUSCULAR | Status: DC | PRN
  Administered 2018-03-21: 2 mL

## 2018-03-21 MED ORDER — ONDANSETRON HCL 4 MG/2ML IJ SOLN: 4.0000 mg | Freq: Four times a day (QID) | INTRAMUSCULAR | Status: DC | PRN

## 2018-03-21 MED ORDER — ACETAMINOPHEN 325 MG PO TABS: 650.0000 mg | ORAL_TABLET | ORAL | Status: DC | PRN

## 2018-03-21 MED ORDER — HEPARIN SODIUM (PORCINE) 1000 UNIT/ML IJ SOLN
INTRAMUSCULAR | Status: DC | PRN
  Administered 2018-03-21: 4500 [IU] via INTRAVENOUS

## 2018-03-21 MED ORDER — HEPARIN (PORCINE) IN NACL 1000-0.9 UT/500ML-% IV SOLN
INTRAVENOUS | Status: DC | PRN
  Administered 2018-03-21 (×2): 500 mL

## 2018-03-21 MED ORDER — FENTANYL CITRATE (PF) 100 MCG/2ML IJ SOLN
INTRAMUSCULAR | Status: DC | PRN
  Administered 2018-03-21: 25 ug via INTRAVENOUS

## 2018-03-21 MED ORDER — VERAPAMIL HCL 2.5 MG/ML IV SOLN
INTRAVENOUS | Status: DC | PRN
  Administered 2018-03-21: 10 mL via INTRA_ARTERIAL

## 2018-03-21 MED ORDER — ATORVASTATIN CALCIUM 40 MG PO TABS: 40.0000 mg | ORAL_TABLET | Freq: Every day | ORAL | 0 refills | Status: DC

## 2018-03-21 MED ORDER — HEPARIN SODIUM (PORCINE) 1000 UNIT/ML IJ SOLN
INTRAMUSCULAR | Status: AC
  Filled 2018-03-21: qty 1

## 2018-03-21 MED ORDER — FENTANYL CITRATE (PF) 100 MCG/2ML IJ SOLN
INTRAMUSCULAR | Status: AC
  Filled 2018-03-21: qty 2

## 2018-03-21 MED ORDER — LIDOCAINE HCL (PF) 1 % IJ SOLN
INTRAMUSCULAR | Status: AC
  Filled 2018-03-21: qty 30

## 2018-03-21 MED ORDER — VERAPAMIL HCL 2.5 MG/ML IV SOLN
INTRAVENOUS | Status: AC
  Filled 2018-03-21: qty 2

## 2018-03-21 SURGICAL SUPPLY — 10 items
CATH 5FR JL3.5 JR4 ANG PIG MP (CATHETERS) ×1 IMPLANT
DEVICE RAD COMP TR BAND LRG (VASCULAR PRODUCTS) ×1 IMPLANT
GLIDESHEATH SLEND SS 6F .021 (SHEATH) ×1 IMPLANT
GUIDEWIRE INQWIRE 1.5J.035X260 (WIRE) IMPLANT
INQWIRE 1.5J .035X260CM (WIRE) ×2
KIT HEART LEFT (KITS) ×2 IMPLANT
PACK CARDIAC CATHETERIZATION (CUSTOM PROCEDURE TRAY) ×2 IMPLANT
SHEATH PROBE COVER 6X72 (BAG) ×1 IMPLANT
TRANSDUCER W/STOPCOCK (MISCELLANEOUS) ×2 IMPLANT
TUBING CIL FLEX 10 FLL-RA (TUBING) ×2 IMPLANT

## 2018-03-21 NOTE — Discharge Instructions (Signed)

## 2018-03-21 NOTE — Discharge Summary (Signed)
Physician Discharge Summary  Anthony King WUX:[id number] DOB: 21-Oct-1950  PCP: Ann Held, DO  Admit date: 03/18/2018 Discharge date: 03/21/2018  Recommendations for Outpatient Follow-up:  1. Dr. Roma Schanz PCP in 1 week with repeat labs (CBC & BMP). 2. Dr. Lauree Chandler, Cardiology  Home Health: None Equipment/Devices: None  Discharge Condition: Improved and stable CODE STATUS: Full Diet recommendation: Heart healthy diet.  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  Admitted for evaluation and management of unstable angina.  No personal or family history of CAD.  Nuclear stress test 08/12/2016: Low risk study.  Cardiology consulted and patient underwent cardiac cath which showed nonobstructive CAD.   Assessment & Plan:   Chest pain/unstable angina: CTA chest/abdomen and pelvis: No acute aortic syndrome, no central PE but showed 1 vessel coronary atherosclerosis and aortic atherosclerosis.  Negative RUQ ultrasound.  LFTs and lipase unremarkable.  EKG without acute findings.  Troponin x3 Neg. Cardiology was consulted and patient underwent cardiac cath 03/21/2018 which showed nonobstructive CAD.  I discussed with the Interventional Cardiologist and the rounding Cardiologist who have cleared him for discharge home on statins alone and  no aspirin.  TTE normal.  Possibly noncardiac/?  Musculoskeletal etiology of chest pain.  GERD: Chest pain symptoms not consistent with his usual reflux.  Continue PPI.  Hyperlipidemia: LDL 150.  Not on statins PTA.  Now on atorvastatin 40 mg daily, continue.  Stage III chronic kidney disease: Baseline creatinine possibly in the 1.3 range.  Minimize contrast at cath.  Hydrated precath.  At baseline now.  Follow BMP periodically as outpatient.  Colonic (adenomatous and hyperplastic) polyps: Continue outpatient follow-up with [facility].  Overweight/Body mass index is 29.54 kg/m.   Hypoglycemia: Had CBG of 66 on 9/22 evening.  Unclear etiology.  Has glucose intolerance.  Resolved.   Consultations:  Cardiology  Procedures:  Left heart cath and coronary angiography 03/21/2018:  Conclusion   Ost 2nd Diag to 2nd Diag lesion is 40% stenosed.  LV end diastolic pressure is normal. LVEDP 9 mm Hg.  There is no aortic valve stenosis.  Nonobstructive CAD.   Continue aggressive preventive therapy.   Consider other causes of chest pain.    Discharge Instructions  Discharge Instructions    Call MD for:  difficulty breathing, headache or visual disturbances   Complete by:  As directed    Call MD for:  extreme fatigue   Complete by:  As directed    Call MD for:  persistant dizziness or light-headedness   Complete by:  As directed    Call MD for:  redness, tenderness, or signs of infection (pain, swelling, redness, odor or green/yellow discharge around incision site)   Complete by:  As directed    Call MD for:  severe uncontrolled pain   Complete by:  As directed    Diet - low sodium heart healthy  Complete by:  As directed    Increase activity slowly   Complete by:  As directed        Medication List    TAKE these medications   atorvastatin 40 MG tablet Commonly known as:  LIPITOR Take 1 tablet (40 mg total) by mouth daily at 6 PM. Start taking on:  03/22/2018    lansoprazole 30 MG capsule Commonly known as:  PREVACID Take 30 mg by mouth daily at 12 noon.   multivitamin with minerals Tabs tablet Take 1 tablet by mouth daily.   vitamin B-12 1000 MCG tablet Commonly known as:  CYANOCOBALAMIN Take 1,000 mcg by mouth daily.      Follow-up Information    Ann Held, DO. Schedule an appointment as soon as possible for a visit in 1 week(s).   Specialty:  Family Medicine Why:  To be seen with repeat labs (CBC & BMP). Contact information: [address] [address] [phone]        Burnell Blanks, MD. Schedule an appointment as soon as possible for a visit.   Specialty:  Cardiology Contact information: [address] [place] [address] [phone]          Allergies  Allergen Reactions  . Aspirin Other (See Comments)    REACTION: nauseated, drooling      Procedures/Studies: Dg Chest Portable 1 View  Result Date: 03/18/2018 CLINICAL DATA:  Chest pain. EXAM: PORTABLE CHEST 1 VIEW COMPARISON:  Rib series 10/12/2016. FINDINGS: Mediastinum and hilar structures normal. Heart size stable. Lungs are clear. Small right pleural effusion. Heart size normal. No acute bony abnormality. IMPRESSION: 1.  Small right pleural effusion. 2.  Exam is otherwise unremarkable. Electronically Signed   By: Marcello Moores  Register   On: 03/18/2018 11:45   Ct Angio Chest/abd/pel For Dissection W And/or Wo Contrast  Result Date: 03/18/2018 CLINICAL DATA:  Sudden onset chest pain radiating to the back. EXAM: CT ANGIOGRAPHY CHEST, ABDOMEN AND PELVIS TECHNIQUE: Multidetector CT imaging through the chest, abdomen and pelvis was performed using the standard protocol during bolus administration of intravenous contrast. Multiplanar reconstructed images and MIPs were obtained and reviewed to evaluate the vascular anatomy. CONTRAST:  117mL ISOVUE-370 IOPAMIDOL (ISOVUE-370) INJECTION 76% COMPARISON:  Chest radiograph from  earlier today. FINDINGS: CTA CHEST FINDINGS Cardiovascular: Normal heart size. No significant pericardial effusion/thickening. Left anterior descending coronary atherosclerosis. Normal course and caliber of the mildly atherosclerotic thoracic aorta. No evidence of acute intramural hematoma, dissection, pseudoaneurysm or penetrating atherosclerotic ulcer in the thoracic aorta. Patent aortic arch branch vessels. Normal caliber pulmonary arteries. No central pulmonary emboli. Mediastinum/Nodes: No discrete thyroid nodules. Unremarkable esophagus. No pathologically enlarged axillary, mediastinal or hilar lymph nodes. Lungs/Pleura: No pneumothorax. No pleural effusion. No acute consolidative airspace disease, lung masses or significant pulmonary nodules. Musculoskeletal: Sclerotic exophytic 1.4 cm lesion at the lateral lower right scapula (series 7/image 69) without overtly aggressive features, favor an osteoma. Moderate thoracic spondylosis. Review of the MIP images confirms the above findings. CTA ABDOMEN AND PELVIS FINDINGS VASCULAR Aorta: Normal caliber mildly atherosclerotic aorta without aneurysm, dissection, vasculitis or significant stenosis. Celiac: Patent without evidence of aneurysm, dissection, vasculitis or significant stenosis. SMA: Patent without evidence of aneurysm, dissection, vasculitis or significant stenosis. Renals: Bilateral renal arteries are patent without evidence of aneurysm, dissection, vasculitis, fibromuscular dysplasia or significant stenosis. IMA: Patent without evidence of aneurysm, dissection, vasculitis or significant stenosis. Inflow: Patent without evidence of aneurysm, dissection, vasculitis or significant stenosis. Veins: No  obvious venous abnormality within the limitations of this arterial phase study. Review of the MIP images confirms the above findings. NON-VASCULAR Hepatobiliary: Normal liver with no liver mass. Possible tiny gallstone in the gallbladder neck. No gallbladder  wall thickening or pericholecystic fluid. No biliary ductal dilatation. Pancreas: Normal, with no mass or duct dilation. Spleen: Normal size. No mass. Adrenals/Urinary Tract: Normal adrenals. No hydronephrosis. No contour deforming renal masses. Normal bladder. Stomach/Bowel: Normal non-distended stomach. Normal caliber small bowel with no small bowel wall thickening. Appendectomy. Mild left colonic diverticulosis, most prominent in the sigmoid colon, with no large bowel wall thickening or pericolonic fat stranding. Vascular/Lymphatic: Atherosclerotic nonaneurysmal abdominal aorta. No pathologically enlarged lymph nodes in the abdomen or pelvis. Reproductive: Top-normal size prostate. Other: No pneumoperitoneum, ascites or focal fluid collection. Musculoskeletal: No aggressive appearing focal osseous lesions. Nonspecific small sclerotic lesion in the upper left sacrum, most likely a benign bone island. Moderate lumbar spondylosis. Review of the MIP images confirms the above findings. IMPRESSION: 1. No acute aortic syndrome. 2. One vessel coronary atherosclerosis. 3. Possible cholelithiasis.  No evidence of acute cholecystitis. 4. Mild left colonic diverticulosis. 5.  Aortic Atherosclerosis (ICD10-I70.0). Electronically Signed   By: Ilona Sorrel M.D.   On: 03/18/2018 12:42   US Abdomen Limited Ruq  Result Date: 03/18/2018 CLINICAL DATA:  Chest pain. EXAM: ULTRASOUND ABDOMEN LIMITED RIGHT UPPER QUADRANT COMPARISON:  None. FINDINGS: Gallbladder: No gallstones or wall thickening visualized. No sonographic Murphy sign noted by sonographer. Maximal wall thickness is 1.5 mm, within normal limits Common bile duct: Diameter: 3.7 mm, within normal limits Liver: No focal lesion identified. Within normal limits in parenchymal echogenicity. Portal vein is patent on color Doppler imaging with normal direction of blood flow towards the liver. IMPRESSION: Negative right upper quadrant ultrasound. No acute or focal lesion  explain the patient's chest pain. Electronically Signed   By: San Morelle M.D.   On: 03/18/2018 14:41      Subjective: No recurrence of chest pain.  Denies any other complaints.  Anxious to return home.  Discharge Exam:  Vitals:   03/21/18 1459 03/21/18 1504 03/21/18 1509 03/21/18 1514  BP: 130/79 126/79 126/77 129/81  Pulse: (!) 57 60 67 (!) 58  Resp: 14 15 12 20   Temp:      TempSrc:      SpO2: 99% 100% 98% 98%  Weight:      Height:        General exam: Pleasant middle-aged male, moderately built and overweight sitting up comfortably in bed. Respiratory system: Clear to auscultation. Respiratory effort normal.  No reproducible chest wall tenderness. Cardiovascular system: S1 & S2 heard, RRR. No JVD, murmurs, rubs, gallops or clicks. No pedal edema.   Gastrointestinal system: Abdomen is nondistended, soft and nontender. No organomegaly or masses felt. Normal bowel sounds heard. Central nervous system: Alert and oriented. No focal neurological deficits. Extremities: Symmetric 5 x 5 power. Skin: No rashes, lesions or ulcers Psychiatry: Judgement and insight appear normal. Mood & affect appropriate.     The results of significant diagnostics from this hospitalization (including imaging, microbiology, ancillary and laboratory) are listed below for reference.      Labs: CBC: Recent Labs  Lab 03/18/18 1127 03/18/18 1207  WBC 7.0  --   HGB 12.9* 13.3  HCT 42.2 39.0  MCV 82.7  --   PLT 181  --    Basic Metabolic Panel: Recent Labs  Lab 03/18/18 1127 03/18/18 1145 03/18/18 1207 03/19/18 0619 03/21/18 0741  NA  141  --  143 142 139  K 4.2  --  4.1 4.2 4.1  CL 110  --  107 109 107  CO2 20*  --   --  25 26  GLUCOSE 130*  --  121* 97 104*  BUN 13  --  15 11 16   CREATININE 1.40* 1.40* 1.40* 1.31* 1.32*  CALCIUM 8.9  --   --  8.6* 8.9   Liver Function Tests: Recent Labs  Lab 03/18/18 1127  AST 28  ALT 21  ALKPHOS 76  BILITOT 1.0  PROT 5.7*  ALBUMIN  3.4*   Cardiac Enzymes: Recent Labs  Lab 03/18/18 1735 03/18/18 2352 03/19/18 0619  TROPONINI <0.03 <0.03 <0.03   CBG: Recent Labs  Lab 03/19/18 2324 03/20/18 0810 03/20/18 1701 03/20/18 1740 03/21/18 0746  GLUCAP 102* 92 66* 96 90    Lipid Profile Recent Labs    03/19/18 0619  CHOL 207*  HDL 33*  LDLCALC 150*  TRIG 120  CHOLHDL 6.3     Time coordinating discharge: 25 minutes  SIGNED:  Vernell Leep, MD, FACP, [facility]. [facility] Pager [phone]  If 7PM-7AM, please contact night-coverage [email or web address] Password [facility] 03/21/2018, 6:05 PM

## 2018-03-21 NOTE — Interval H&P Note (Signed)
Cath Lab Visit (complete for each Cath Lab visit)  Clinical Evaluation Leading to the Procedure:   ACS: Yes.    Non-ACS:    Anginal Classification: CCS IV  Anti-ischemic medical therapy: Minimal Therapy (1 class of medications)  Non-Invasive Test Results: No non-invasive testing performed  Prior CABG: No previous CABG      History and Physical Interval Note:  03/21/2018 2:38 PM  Anthony King  has presented today for surgery, with the diagnosis of unstable angina  The various methods of treatment have been discussed with the patient and family. After consideration of risks, benefits and other options for treatment, the patient has consented to  Procedure(s): LEFT HEART CATH AND CORONARY ANGIOGRAPHY (N/A) as a surgical intervention .  The patient's history has been reviewed, patient examined, no change in status, stable for surgery.  I have reviewed the patient's chart and labs.  Questions were answered to the patient's satisfaction.     Larae Grooms

## 2018-03-21 NOTE — Progress Notes (Signed)
PROGRESS NOTE   Anthony King  TGY:[id number]    DOB: 07-Dec-1950    DOA: 03/18/2018  PCP: Ann Held, DO   I have briefly reviewed patients previous medical records in [facility]  Brief Narrative:  67 year old married male, physically quite active, lives in a 1 level house, does Archivist work for a living and is "always on his feet", PMH of GERD, hyperlipidemia, colonic "precancerous" polyps status post colonoscopy and polypectomy last year, stage III chronic kidney disease, remote smoker, presented to [facility] ED due to chest pain.  Admitted for evaluation and management of unstable angina.  No personal or family history of CAD.  Nuclear stress test 08/12/2016: Low risk study.  Cardiology consulted and scheduled for cardiac cath 9/23 afternoon.   Assessment & Plan:   Principal Problem:   Chest pain Active Problems:   Esophageal reflux   Personal history of colonic polyps   Hyperlipidemia   CKD (chronic kidney disease), stage III (HCC)   CAD (coronary artery disease), native coronary artery   Aortic atherosclerosis (HCC)   Prediabetes   Unstable angina (HCC)   Chest pain/unstable angina: CTA chest/abdomen and pelvis: No acute aortic syndrome, no central PE but showed 1 vessel coronary atherosclerosis and aortic atherosclerosis.  Negative RUQ ultrasound.  LFTs and lipase unremarkable.  EKG without acute findings.  Troponin x3 Neg. Cardiology was consulted and plan cardiac cath 9/23 afternoon.  Continue aspirin (reported intolerance but tolerating here), atorvastatin.  Not on beta-blockers due to bradycardia.  Patient had transient chest pain this morning.  TTE normal.  GERD: Chest pain symptoms not consistent with his usual reflux.  Continue PPI.  Hyperlipidemia: LDL 150.  Not on statins PTA.  Now on atorvastatin 40 mg daily.  Stage III chronic kidney disease: Baseline creatinine possibly in the 1.3 range.  Minimize contrast at cath.  Hydrated precath.  At baseline  now.  Follow BMP periodically as outpatient.  Colonic (adenomatous and hyperplastic) polyps: Continue outpatient follow-up with [facility].  Overweight/Body mass index is 29.54 kg/m.   Hypoglycemia: Had CBG of 66 on 9/22 evening.  Unclear etiology.  Has glucose intolerance.  Resolved.    DVT prophylaxis: Lovenox Code Status: Full full Family Communication: Discussed in detail with patient's spouse and cousin at bedside.  Updated care and answered questions. Disposition: Pending cardiac cath and cardiology recommendations.   Consultants:  Cardiology  Procedures:  None  Antimicrobials:  None   Subjective: Reported transient/30 seconds of 4/10 right lower parasternal pain similar to the one he had on admission.  Resolved spontaneously.  Currently without chest pain or complaints.  ROS: As above, otherwise negative.  Objective:  Vitals:   03/20/18 1254 03/20/18 1703 03/20/18 1937 03/21/18 0642  BP: 139/82 139/78 132/79 133/78  Pulse: (!) 57 61 66 (!) 54  Resp: 18 16 18 19   Temp: 98.2 F (36.8 C) 98.2 F (36.8 C) 98.2 F (36.8 C) 97.8 F (36.6 C)  TempSrc: Oral Oral Oral Oral  SpO2: 98% 99% 97% 100%  Weight:    93.4 kg  Height:        Examination:  General exam: Pleasant middle-aged male, moderately built and overweight sitting up comfortably in bed. Respiratory system: Clear to auscultation. Respiratory effort normal.  No reproducible chest wall tenderness. Cardiovascular system: S1 & S2 heard, RRR. No JVD, murmurs, rubs, gallops or clicks. No pedal edema. Gastrointestinal system: Abdomen is nondistended, soft and nontender. No organomegaly or masses felt. Normal bowel sounds heard. Central  nervous system: Alert and oriented. No focal neurological deficits. Extremities: Symmetric 5 x 5 power. Skin: No rashes, lesions or ulcers Psychiatry: Judgement and insight appear normal. Mood & affect appropriate.     Data Reviewed: I have personally reviewed following  labs and imaging studies  CBC: Recent Labs  Lab 03/18/18 1127 03/18/18 1207  WBC 7.0  --   HGB 12.9* 13.3  HCT 42.2 39.0  MCV 82.7  --   PLT 181  --    Basic Metabolic Panel: Recent Labs  Lab 03/18/18 1127 03/18/18 1145 03/18/18 1207 03/19/18 0619 03/21/18 0741  NA 141  --  143 142 139  K 4.2  --  4.1 4.2 4.1  CL 110  --  107 109 107  CO2 20*  --   --  25 26  GLUCOSE 130*  --  121* 97 104*  BUN 13  --  15 11 16   CREATININE 1.40* 1.40* 1.40* 1.31* 1.32*  CALCIUM 8.9  --   --  8.6* 8.9   Liver Function Tests: Recent Labs  Lab 03/18/18 1127  AST 28  ALT 21  ALKPHOS 76  BILITOT 1.0  PROT 5.7*  ALBUMIN 3.4*   Coagulation Profile: Recent Labs  Lab 03/18/18 1139  INR 1.01   Cardiac Enzymes: Recent Labs  Lab 03/18/18 1735 03/18/18 2352 03/19/18 0619  TROPONINI <0.03 <0.03 <0.03   HbA1C: Recent Labs    03/18/18 1735  HGBA1C 5.7*   CBG: Recent Labs  Lab 03/19/18 2324 03/20/18 0810 03/20/18 1701 03/20/18 1740 03/21/18 0746  GLUCAP 102* 92 66* 96 90    No results found for this or any previous visit (from the past 240 hour(s)).       Radiology Studies: No results found.      Scheduled Meds: . aspirin  325 mg Oral Daily  . atorvastatin  40 mg Oral q1800  . enoxaparin (LOVENOX) injection  40 mg Subcutaneous Q24H  . multivitamin with minerals  1 tablet Oral Daily  . pantoprazole  40 mg Oral Daily  . sodium chloride flush  3 mL Intravenous Q12H  . sodium chloride flush  3 mL Intravenous Q12H  . vitamin B-12  1,000 mcg Oral Daily   Continuous Infusions: . sodium chloride    . sodium chloride 100 mL/hr at 03/21/18 0933     LOS: 2 days     Vernell Leep, MD, FACP, [facility]. [facility] Pager [phone]  If 7PM-7AM, please contact night-coverage [email or web address] Password [facility] 03/21/2018, 9:57 AM

## 2018-03-21 NOTE — Research (Signed)
CADFEM Informed Consent   Subject Name: Anthony King  Subject met inclusion and exclusion criteria.  The informed consent form, study requirements and expectations were reviewed with the subject and questions and concerns were addressed prior to the signing of the consent form.  The subject verbalized understanding of the trail requirements.  The subject agreed to participate in the CADFEM trial and signed the informed consent.  The informed consent was obtained prior to performance of any protocol-specific procedures for the subject.  A copy of the signed informed consent was given to the subject and a copy was placed in the subject's medical record.  Anthony King 03/21/2018, 10:10 AM

## 2018-03-21 NOTE — Progress Notes (Signed)
Progress Note  Patient Name: Anthony King Date of Encounter: 03/21/2018  Primary Cardiologist:   No primary care provider on file.   Subjective   Chest pain last night.  Sharp chest pain this morning.  Inpatient Medications    Scheduled Meds: . aspirin  325 mg Oral Daily  . atorvastatin  40 mg Oral q1800  . enoxaparin (LOVENOX) injection  40 mg Subcutaneous Q24H  . multivitamin with minerals  1 tablet Oral Daily  . pantoprazole  40 mg Oral Daily  . sodium chloride flush  3 mL Intravenous Q12H  . sodium chloride flush  3 mL Intravenous Q12H  . vitamin B-12  1,000 mcg Oral Daily   Continuous Infusions: . sodium chloride    . sodium chloride 100 mL/hr at 03/21/18 0005   PRN Meds: sodium chloride, acetaminophen **OR** acetaminophen, alum & mag hydroxide-simeth, bisacodyl, calcium carbonate, morphine injection, nitroGLYCERIN, sodium chloride flush   Vital Signs    Vitals:   03/20/18 1254 03/20/18 1703 03/20/18 1937 03/21/18 0642  BP: 139/82 139/78 132/79 133/78  Pulse: (!) 57 61 66 (!) 54  Resp: 18 16 18 19   Temp: 98.2 F (36.8 C) 98.2 F (36.8 C) 98.2 F (36.8 C) 97.8 F (36.6 C)  TempSrc: Oral Oral Oral Oral  SpO2: 98% 99% 97% 100%  Weight:    93.4 kg  Height:        Intake/Output Summary (Last 24 hours) at 03/21/2018 0807 Last data filed at 03/21/2018 0200 Gross per 24 hour  Intake 887.51 ml  Output 1283 ml  Net -395.49 ml   Filed Weights   03/19/18 1301 03/20/18 0511 03/21/18 0642  Weight: 93.6 kg 93.3 kg 93.4 kg    Telemetry    NSR, SB - Personally Reviewed  ECG    NA - Personally Reviewed  Physical Exam   GEN: No acute distress.   Neck: No  JVD Cardiac: RRR, no murmurs, rubs, or gallops.  Respiratory: Clear  to auscultation bilaterally. GI: Soft, nontender, non-distended  MS: No  edema; No deformity. Neuro:  Nonfocal  Psych: Normal affect   Labs    Chemistry Recent Labs  Lab 03/18/18 1127 03/18/18 1145 03/18/18 1207  03/19/18 0619  NA 141  --  143 142  K 4.2  --  4.1 4.2  CL 110  --  107 109  CO2 20*  --   --  25  GLUCOSE 130*  --  121* 97  BUN 13  --  15 11  CREATININE 1.40* 1.40* 1.40* 1.31*  CALCIUM 8.9  --   --  8.6*  PROT 5.7*  --   --   --   ALBUMIN 3.4*  --   --   --   AST 28  --   --   --   ALT 21  --   --   --   ALKPHOS 76  --   --   --   BILITOT 1.0  --   --   --   GFRNONAA 50*  --   --  55*  GFRAA 59*  --   --  >60  ANIONGAP 11  --   --  8     Hematology Recent Labs  Lab 03/18/18 1127 03/18/18 1207  WBC 7.0  --   RBC 5.10  --   HGB 12.9* 13.3  HCT 42.2 39.0  MCV 82.7  --   MCH 25.3*  --   MCHC 30.6  --  RDW 14.0  --   PLT 181  --     Cardiac Enzymes Recent Labs  Lab 03/18/18 1735 03/18/18 2352 03/19/18 0619  TROPONINI <0.03 <0.03 <0.03    Recent Labs  Lab 03/18/18 1144  TROPIPOC 0.00     BNPNo results for input(s): BNP, PROBNP in the last 168 hours.   DDimer No results for input(s): DDIMER in the last 168 hours.   Radiology    No results found.  Cardiac Studies   03/19/18 Echo  Study Conclusions  - Left ventricle: The cavity size was normal. Wall thickness was   increased in a pattern of mild LVH. Systolic function was normal.   The estimated ejection fraction was in the range of 55% to 60%.   Wall motion was normal; there were no regional wall motion   abnormalities. - Aortic valve: There was mild to moderate regurgitation. - Pulmonary arteries: Systolic pressure was mildly increased. PA   peak pressure: 35 mm Hg (S).  Patient Profile     67 y.o. male with a hx of chronic kidney disease, GERD and hyperlipidemia who is being seen today for the evaluation of chest pain at the request of [place].  Assessment & Plan    CHEST PAIN:    Normal echo as above. Cath today.  Patient well aware of the procedure.     CKD III:  Creat improved slightly since admission.  Limit contrast.  Hydrated pre cath.    DYSLIPIDEMIA:  On Lipitor.  LDL 150.   Pending results of cath will decide of goals of care.  If CAD then change to Crestor 40 mg at discharge.     For questions or updates, please contact [place] Please consult [email or web address] for contact info under Cardiology/STEMI.   Signed, Minus Breeding, MD  03/21/2018, 8:07 AM

## 2018-03-21 NOTE — H&P (View-Only) (Signed)
Progress Note  Patient Name: Anthony King Date of Encounter: 03/21/2018  Primary Cardiologist:   No primary care provider on file.   Subjective   Chest pain last night.  Sharp chest pain this morning.  Inpatient Medications    Scheduled Meds: . aspirin  325 mg Oral Daily  . atorvastatin  40 mg Oral q1800  . enoxaparin (LOVENOX) injection  40 mg Subcutaneous Q24H  . multivitamin with minerals  1 tablet Oral Daily  . pantoprazole  40 mg Oral Daily  . sodium chloride flush  3 mL Intravenous Q12H  . sodium chloride flush  3 mL Intravenous Q12H  . vitamin B-12  1,000 mcg Oral Daily   Continuous Infusions: . sodium chloride    . sodium chloride 100 mL/hr at 03/21/18 0005   PRN Meds: sodium chloride, acetaminophen **OR** acetaminophen, alum & mag hydroxide-simeth, bisacodyl, calcium carbonate, morphine injection, nitroGLYCERIN, sodium chloride flush   Vital Signs    Vitals:   03/20/18 1254 03/20/18 1703 03/20/18 1937 03/21/18 0642  BP: 139/82 139/78 132/79 133/78  Pulse: (!) 57 61 66 (!) 54  Resp: 18 16 18 19   Temp: 98.2 F (36.8 C) 98.2 F (36.8 C) 98.2 F (36.8 C) 97.8 F (36.6 C)  TempSrc: Oral Oral Oral Oral  SpO2: 98% 99% 97% 100%  Weight:    93.4 kg  Height:        Intake/Output Summary (Last 24 hours) at 03/21/2018 0807 Last data filed at 03/21/2018 0200 Gross per 24 hour  Intake 887.51 ml  Output 1283 ml  Net -395.49 ml   Filed Weights   03/19/18 1301 03/20/18 0511 03/21/18 0642  Weight: 93.6 kg 93.3 kg 93.4 kg    Telemetry    NSR, SB - Personally Reviewed  ECG    NA - Personally Reviewed  Physical Exam   GEN: No acute distress.   Neck: No  JVD Cardiac: RRR, no murmurs, rubs, or gallops.  Respiratory: Clear  to auscultation bilaterally. GI: Soft, nontender, non-distended  MS: No  edema; No deformity. Neuro:  Nonfocal  Psych: Normal affect   Labs    Chemistry Recent Labs  Lab 03/18/18 1127 03/18/18 1145 03/18/18 1207  03/19/18 0619  NA 141  --  143 142  K 4.2  --  4.1 4.2  CL 110  --  107 109  CO2 20*  --   --  25  GLUCOSE 130*  --  121* 97  BUN 13  --  15 11  CREATININE 1.40* 1.40* 1.40* 1.31*  CALCIUM 8.9  --   --  8.6*  PROT 5.7*  --   --   --   ALBUMIN 3.4*  --   --   --   AST 28  --   --   --   ALT 21  --   --   --   ALKPHOS 76  --   --   --   BILITOT 1.0  --   --   --   GFRNONAA 50*  --   --  55*  GFRAA 59*  --   --  >60  ANIONGAP 11  --   --  8     Hematology Recent Labs  Lab 03/18/18 1127 03/18/18 1207  WBC 7.0  --   RBC 5.10  --   HGB 12.9* 13.3  HCT 42.2 39.0  MCV 82.7  --   MCH 25.3*  --   MCHC 30.6  --  RDW 14.0  --   PLT 181  --     Cardiac Enzymes Recent Labs  Lab 03/18/18 1735 03/18/18 2352 03/19/18 0619  TROPONINI <0.03 <0.03 <0.03    Recent Labs  Lab 03/18/18 1144  TROPIPOC 0.00     BNPNo results for input(s): BNP, PROBNP in the last 168 hours.   DDimer No results for input(s): DDIMER in the last 168 hours.   Radiology    No results found.  Cardiac Studies   03/19/18 Echo  Study Conclusions  - Left ventricle: The cavity size was normal. Wall thickness was   increased in a pattern of mild LVH. Systolic function was normal.   The estimated ejection fraction was in the range of 55% to 60%.   Wall motion was normal; there were no regional wall motion   abnormalities. - Aortic valve: There was mild to moderate regurgitation. - Pulmonary arteries: Systolic pressure was mildly increased. PA   peak pressure: 35 mm Hg (S).  Patient Profile     67 y.o. male with a hx of chronic kidney disease, GERD and hyperlipidemia who is being seen today for the evaluation of chest pain at the request of [place].  Assessment & Plan    CHEST PAIN:    Normal echo as above. Cath today.  Patient well aware of the procedure.     CKD III:  Creat improved slightly since admission.  Limit contrast.  Hydrated pre cath.    DYSLIPIDEMIA:  On Lipitor.  LDL 150.   Pending results of cath will decide of goals of care.  If CAD then change to Crestor 40 mg at discharge.     For questions or updates, please contact [place] Please consult [email or web address] for contact info under Cardiology/STEMI.   Signed, Minus Breeding, MD  03/21/2018, 8:07 AM

## 2018-03-22 ENCOUNTER — Telehealth: Payer: Self-pay

## 2018-03-22 ENCOUNTER — Encounter (HOSPITAL_COMMUNITY): Payer: Self-pay | Admitting: Interventional Cardiology

## 2018-03-22 NOTE — Telephone Encounter (Signed)
03/22/18  Transition Care Management Follow-up Telephone Call  ADMISSION DATE: 03/18/18  DISCHARGE DATE: 03/21/18   How have you been since you were released from the hospital?  Just has weakness pr patient.   Do you understand why you were in the hospital? Yes    Do you understand the discharge instrcutions? Yes    Items Reviewed:  Medications reviewed: Yes  Allergies reviewed: Aspitin  Dietary changes reviewed:Heart healthy  Referrals reviewed: Appointment scheduled  Functional Questionnaire:   Activities of Daily Living (ADLs): Patient can perform all independently.  Any patient concerns? How can his Gallbladder be checked periodically. Concerned about knots on chest .  Confirmed importance and date/time of follow-up visits scheduled:Yes   Confirmed with patient if condition begins to worsen call PCP or go to the ER. Yes   Patient was given the office number and encouragred to call back with questions or concerns. Yes

## 2018-03-22 NOTE — Consult Note (Signed)
            [facility] CM Primary Care Navigator  03/22/2018  Anthony King 1951/01/19 [id number]   Went to seepatient at the bedside to identify possible discharge needs buthe was already discharged home.  Per MD note, patient was admitted for evaluation and management of unstable angina. Cardiology consulted and patient underwent cardiac cath which showed nonobstructive CAD- coronary artery disease.   Patient has discharge instruction to follow-up withprimary care provider in 1 week and cardiology follow-up post hospitalization.  Primary care provider's office is listed as providing transition of care (TOC) follow-up.   For additional questions please contact:  Edwena Felty A. Yahshua Thibault, BSN, RN-BC [facility] PRIMARY CARE Navigator Cell: [phone]

## 2018-04-04 ENCOUNTER — Ambulatory Visit: Payer: Medicare Other | Admitting: *Deleted

## 2018-04-07 ENCOUNTER — Ambulatory Visit: Payer: Medicare Other | Admitting: *Deleted

## 2018-04-07 ENCOUNTER — Ambulatory Visit: Payer: Medicare Other | Admitting: Family Medicine

## 2018-04-15 ENCOUNTER — Ambulatory Visit: Payer: Medicare Other | Admitting: Family Medicine

## 2018-04-18 ENCOUNTER — Ambulatory Visit (INDEPENDENT_AMBULATORY_CARE_PROVIDER_SITE_OTHER): Payer: Medicare Other | Admitting: Family Medicine

## 2018-04-18 ENCOUNTER — Encounter: Payer: Self-pay | Admitting: Family Medicine

## 2018-04-18 VITALS — BP 138/71 | HR 61 | Temp 97.9°F | Resp 16 | Ht 70.0 in | Wt 209.0 lb

## 2018-04-18 DIAGNOSIS — N183 Chronic kidney disease, stage 3 unspecified: Secondary | ICD-10-CM | POA: Insufficient documentation

## 2018-04-18 DIAGNOSIS — R079 Chest pain, unspecified: Secondary | ICD-10-CM

## 2018-04-18 LAB — CBC WITH DIFFERENTIAL/PLATELET
Basophils Absolute: 0.1 K/uL (ref 0.0–0.1)
Basophils Relative: 1 % (ref 0.0–3.0)
Eosinophils Absolute: 0.1 K/uL (ref 0.0–0.7)
Eosinophils Relative: 1.7 % (ref 0.0–5.0)
HCT: 43.7 % (ref 39.0–52.0)
Hemoglobin: 14.4 g/dL (ref 13.0–17.0)
Lymphocytes Relative: 28.8 % (ref 12.0–46.0)
Lymphs Abs: 2 K/uL (ref 0.7–4.0)
MCHC: 32.9 g/dL (ref 30.0–36.0)
MCV: 78.4 fl (ref 78.0–100.0)
Monocytes Absolute: 0.6 K/uL (ref 0.1–1.0)
Monocytes Relative: 9 % (ref 3.0–12.0)
Neutro Abs: 4.2 K/uL (ref 1.4–7.7)
Neutrophils Relative %: 59.5 % (ref 43.0–77.0)
Platelets: 179 K/uL (ref 150.0–400.0)
RBC: 5.58 Mil/uL (ref 4.22–5.81)
RDW: 15 % (ref 11.5–15.5)
WBC: 7 K/uL (ref 4.0–10.5)

## 2018-04-18 LAB — COMPREHENSIVE METABOLIC PANEL
ALT: 20 U/L (ref 0–53)
AST: 20 U/L (ref 0–37)
Albumin: 4.3 g/dL (ref 3.5–5.2)
Alkaline Phosphatase: 87 U/L (ref 39–117)
BUN: 19 mg/dL (ref 6–23)
CHLORIDE: 106 meq/L (ref 96–112)
CO2: 28 meq/L (ref 19–32)
Calcium: 9.5 mg/dL (ref 8.4–10.5)
Creatinine, Ser: 1.24 mg/dL (ref 0.40–1.50)
GFR: 61.73 mL/min (ref >60.00)
GLUCOSE: 104 mg/dL — AB (ref 70–99)
POTASSIUM: 4.6 meq/L (ref 3.5–5.1)
SODIUM: 141 meq/L (ref 135–145)
TOTAL PROTEIN: 6.5 g/dL (ref 6.0–8.3)
Total Bilirubin: 1.2 mg/dL (ref 0.2–1.2)

## 2018-04-18 MED ORDER — GI COCKTAIL ~~LOC~~
30.0000 mL | Freq: Once | ORAL | Status: AC
  Administered 2018-04-18: 30 mL via ORAL

## 2018-04-18 NOTE — Patient Instructions (Signed)
Chest Wall Pain °Chest wall pain is pain in or around the bones and muscles of your chest. Sometimes, an injury causes this pain. Sometimes, the cause may not be known. This pain may take several weeks or longer to get better. °Follow these instructions at home: °Pay attention to any changes in your symptoms. Take these actions to help with your pain: °· Rest as told by your health care provider. °· Avoid activities that cause pain. These include any activities that use your chest muscles or your abdominal and side muscles to lift heavy items. °· If directed, apply ice to the painful area: °? Put ice in a plastic bag. °? Place a towel between your skin and the bag. °? Leave the ice on for 20 minutes, 2-3 times per day. °· Take over-the-counter and prescription medicines only as told by your health care provider. °· Do not use tobacco products, including cigarettes, chewing tobacco, and e-cigarettes. If you need help quitting, ask your health care provider. °· Keep all follow-up visits as told by your health care provider. This is important. ° °Contact a health care provider if: °· You have a fever. °· Your chest pain becomes worse. °· You have new symptoms. °Get help right away if: °· You have nausea or vomiting. °· You feel sweaty or light-headed. °· You have a cough with phlegm (sputum) or you cough up blood. °· You develop shortness of breath. °This information is not intended to replace advice given to you by your health care provider. Make sure you discuss any questions you have with your health care provider. °Document Released: 06/15/2005 Document Revised: 10/24/2015 Document Reviewed: 09/10/2014 °Elsevier Interactive Patient Education © 2018 [employer]. ° °

## 2018-04-18 NOTE — Assessment & Plan Note (Signed)
?   Musculoskeletal GI cocktail given in office with no relief.  Pain only last a few minute F/u cardiology

## 2018-04-18 NOTE — Progress Notes (Signed)
Patient ID: Anthony King, male    DOB: 02-27-1951  Age: 67 y.o. MRN: [id number]    Subjective:  Subjective  HPI [name] presents for hosp f/u for chest pain  9/22-9/23.  cta chest / abd and pelvis: no acute aortic syndrome, no PE  +1 vessel coronary atherosclerosis and aortic atherosclerosis.  Neg [place] abd , lft and lipase unremarkable, ekg without acute findings.  troponinx3 neg.  Cardiology was consulted and pt underwent cardiac cath which showed nonobstructive cad.   TTE normal   Pt d/c home to f/u cardiology outpt.  Pt is requesting to see Dr Tamala Julian.  Other family members see him.    Review of Systems  Constitutional: Negative for chills and fever.  HENT: Negative for congestion and hearing loss.   Eyes: Negative for discharge.  Respiratory: Negative for cough and shortness of breath.   Cardiovascular: Positive for chest pain. Negative for palpitations and leg swelling.  Gastrointestinal: Negative for abdominal pain, blood in stool, constipation, diarrhea, nausea and vomiting.  Genitourinary: Negative for dysuria, frequency, hematuria and urgency.  Musculoskeletal: Negative for back pain and myalgias.  Skin: Negative for rash.  Allergic/Immunologic: Negative for environmental allergies.  Neurological: Negative for dizziness, weakness and headaches.  Hematological: Does not bruise/bleed easily.  Psychiatric/Behavioral: Negative for suicidal ideas. The patient is not nervous/anxious.     History Past Medical History:  Diagnosis Date  . Arthritis 07-17-11   hands, hips, knees  . Basal cell carcinoma    nose  . Chronic kidney disease 07-17-11   past kidney stone x1  . Colon polyps    ADENOMATOUS AND HYPERPLASTIC POLYPS  . Diverticulosis   . Gastric ulcer   . GERD (gastroesophageal reflux disease)   . Hemorrhoids   . Hyperlipidemia   . Wears glasses     He has a past surgical history that includes Appendectomy; fatty tumors (07-17-11); Inguinal hernia repair  (07/23/2011); Colonoscopy; Excision basal cell carcinoma; Mass excision (N/A, 08/29/2013); and LEFT HEART CATH AND CORONARY ANGIOGRAPHY (N/A, 03/21/2018).   His family history includes Arthritis in his maternal grandmother, mother, and paternal grandmother; Breast cancer in his mother; Colon cancer in his sister; Stomach cancer in his father and sister.He reports that he quit smoking about 26 years ago. He has never used smokeless tobacco. He reports that he does not drink alcohol or use drugs.  Current Outpatient Medications on File Prior to Visit  Medication Sig Dispense Refill  . atorvastatin (LIPITOR) 40 MG tablet Take 1 tablet (40 mg total) by mouth daily at 6 PM. 30 tablet 0  . lansoprazole (PREVACID) 30 MG capsule Take 30 mg by mouth daily at 12 noon.    . Multiple Vitamin (MULTIVITAMIN WITH MINERALS) TABS tablet Take 1 tablet by mouth daily.    . vitamin B-12 (CYANOCOBALAMIN) 1000 MCG tablet Take 1,000 mcg by mouth daily.      No current facility-administered medications on file prior to visit.      Objective:  Objective  Physical Exam  Constitutional: He is oriented to person, place, and time. Vital signs are normal. He appears well-developed and well-nourished.  HENT:  Head: Normocephalic and atraumatic.  Mouth/Throat: Oropharynx is clear and moist.  Eyes: Pupils are equal, round, and reactive to light. EOM are normal.  Neck: Normal range of motion. Neck supple. No thyromegaly present.  Cardiovascular: Normal rate and regular rhythm.  No murmur heard. Pulmonary/Chest: Effort normal and breath sounds normal. No respiratory distress. He has no wheezes. He  has no rales. He exhibits no tenderness.  Abdominal: Soft. He exhibits no distension and no mass. There is no tenderness. There is no rebound and no guarding. No hernia.  Musculoskeletal: He exhibits no edema or tenderness.  Neurological: He is alert and oriented to person, place, and time.  Skin: Skin is warm and dry.    Psychiatric: He has a normal mood and affect. His behavior is normal. Judgment and thought content normal.  Nursing note and vitals reviewed.  BP 138/71 (BP Location: Right Arm, Patient Position: Sitting, Cuff Size: Normal)   Pulse 61   Temp 97.9 F (36.6 C) (Oral)   Resp 16   Ht 5\' 10"  (1.778 m)   Wt 209 lb (94.8 kg)   SpO2 100%   BMI 29.99 kg/m  Wt Readings from Last 3 Encounters:  04/18/18 209 lb (94.8 kg)  03/21/18 205 lb 14.4 oz (93.4 kg)  10/04/17 212 lb 3.2 oz (96.3 kg)     Lab Results  Component Value Date   WBC 7.0 04/18/2018   HGB 14.4 04/18/2018   HCT 43.7 04/18/2018   PLT 179.0 04/18/2018   GLUCOSE 104 (H) 04/18/2018   CHOL 207 (H) 03/19/2018   TRIG 120 03/19/2018   HDL 33 (L) 03/19/2018   LDLDIRECT 141.0 06/11/2016   LDLCALC 150 (H) 03/19/2018   ALT 20 04/18/2018   AST 20 04/18/2018   NA 141 04/18/2018   K 4.6 04/18/2018   CL 106 04/18/2018   CREATININE 1.24 04/18/2018   BUN 19 04/18/2018   CO2 28 04/18/2018   PSA 0.68 06/11/2016   INR 1.01 03/18/2018   HGBA1C 5.7 (H) 03/18/2018    Dg Chest Portable 1 View  Result Date: 03/18/2018 CLINICAL DATA:  Chest pain. EXAM: PORTABLE CHEST 1 VIEW COMPARISON:  Rib series 10/12/2016. FINDINGS: Mediastinum and hilar structures normal. Heart size stable. Lungs are clear. Small right pleural effusion. Heart size normal. No acute bony abnormality. IMPRESSION: 1.  Small right pleural effusion. 2.  Exam is otherwise unremarkable. Electronically Signed   By: Marcello Moores  Register   On: 03/18/2018 11:45   Ct Angio Chest/abd/pel For Dissection W And/or Wo Contrast  Result Date: 03/18/2018 CLINICAL DATA:  Sudden onset chest pain radiating to the back. EXAM: CT ANGIOGRAPHY CHEST, ABDOMEN AND PELVIS TECHNIQUE: Multidetector CT imaging through the chest, abdomen and pelvis was performed using the standard protocol during bolus administration of intravenous contrast. Multiplanar reconstructed images and MIPs were obtained and  reviewed to evaluate the vascular anatomy. CONTRAST:  18mL ISOVUE-370 IOPAMIDOL (ISOVUE-370) INJECTION 76% COMPARISON:  Chest radiograph from earlier today. FINDINGS: CTA CHEST FINDINGS Cardiovascular: Normal heart size. No significant pericardial effusion/thickening. Left anterior descending coronary atherosclerosis. Normal course and caliber of the mildly atherosclerotic thoracic aorta. No evidence of acute intramural hematoma, dissection, pseudoaneurysm or penetrating atherosclerotic ulcer in the thoracic aorta. Patent aortic arch branch vessels. Normal caliber pulmonary arteries. No central pulmonary emboli. Mediastinum/Nodes: No discrete thyroid nodules. Unremarkable esophagus. No pathologically enlarged axillary, mediastinal or hilar lymph nodes. Lungs/Pleura: No pneumothorax. No pleural effusion. No acute consolidative airspace disease, lung masses or significant pulmonary nodules. Musculoskeletal: Sclerotic exophytic 1.4 cm lesion at the lateral lower right scapula (series 7/image 69) without overtly aggressive features, favor an osteoma. Moderate thoracic spondylosis. Review of the MIP images confirms the above findings. CTA ABDOMEN AND PELVIS FINDINGS VASCULAR Aorta: Normal caliber mildly atherosclerotic aorta without aneurysm, dissection, vasculitis or significant stenosis. Celiac: Patent without evidence of aneurysm, dissection, vasculitis or significant stenosis. SMA: Patent  without evidence of aneurysm, dissection, vasculitis or significant stenosis. Renals: Bilateral renal arteries are patent without evidence of aneurysm, dissection, vasculitis, fibromuscular dysplasia or significant stenosis. IMA: Patent without evidence of aneurysm, dissection, vasculitis or significant stenosis. Inflow: Patent without evidence of aneurysm, dissection, vasculitis or significant stenosis. Veins: No obvious venous abnormality within the limitations of this arterial phase study. Review of the MIP images confirms the  above findings. NON-VASCULAR Hepatobiliary: Normal liver with no liver mass. Possible tiny gallstone in the gallbladder neck. No gallbladder wall thickening or pericholecystic fluid. No biliary ductal dilatation. Pancreas: Normal, with no mass or duct dilation. Spleen: Normal size. No mass. Adrenals/Urinary Tract: Normal adrenals. No hydronephrosis. No contour deforming renal masses. Normal bladder. Stomach/Bowel: Normal non-distended stomach. Normal caliber small bowel with no small bowel wall thickening. Appendectomy. Mild left colonic diverticulosis, most prominent in the sigmoid colon, with no large bowel wall thickening or pericolonic fat stranding. Vascular/Lymphatic: Atherosclerotic nonaneurysmal abdominal aorta. No pathologically enlarged lymph nodes in the abdomen or pelvis. Reproductive: Top-normal size prostate. Other: No pneumoperitoneum, ascites or focal fluid collection. Musculoskeletal: No aggressive appearing focal osseous lesions. Nonspecific small sclerotic lesion in the upper left sacrum, most likely a benign bone island. Moderate lumbar spondylosis. Review of the MIP images confirms the above findings. IMPRESSION: 1. No acute aortic syndrome. 2. One vessel coronary atherosclerosis. 3. Possible cholelithiasis.  No evidence of acute cholecystitis. 4. Mild left colonic diverticulosis. 5.  Aortic Atherosclerosis (ICD10-I70.0). Electronically Signed   By: Ilona Sorrel M.D.   On: 03/18/2018 12:42   US Abdomen Limited Ruq  Result Date: 03/18/2018 CLINICAL DATA:  Chest pain. EXAM: ULTRASOUND ABDOMEN LIMITED RIGHT UPPER QUADRANT COMPARISON:  None. FINDINGS: Gallbladder: No gallstones or wall thickening visualized. No sonographic Murphy sign noted by sonographer. Maximal wall thickness is 1.5 mm, within normal limits Common bile duct: Diameter: 3.7 mm, within normal limits Liver: No focal lesion identified. Within normal limits in parenchymal echogenicity. Portal vein is patent on color Doppler imaging  with normal direction of blood flow towards the liver. IMPRESSION: Negative right upper quadrant ultrasound. No acute or focal lesion explain the patient's chest pain. Electronically Signed   By: San Morelle M.D.   On: 03/18/2018 14:41     Assessment & Plan:  Plan  I am having Otila Back. Stannard maintain his vitamin B-12, lansoprazole, multivitamin with minerals, and atorvastatin. We administered gi cocktail.  Meds ordered this encounter  Medications  . gi cocktail (Maalox,Lidocaine,Donnatal)    Problem List Items Addressed This Visit      Unprioritized   Chest pain - Primary   Relevant Medications   gi cocktail (Maalox,Lidocaine,Donnatal) (Completed)   Other Relevant Orders   Ambulatory referral to Cardiology   CBC with Differential/Platelet (Completed)   Comprehensive metabolic panel (Completed)    Other Visit Diagnoses    CRF (chronic renal failure), stage 3 (moderate) ([place])       Relevant Orders   Ambulatory referral to Nephrology      Follow-up: Return in about 6 months (around 10/18/2018) for hyperlipidemia.  Ann Held, DO

## 2018-04-18 NOTE — Assessment & Plan Note (Signed)
Check labs  Refer to nephrology

## 2018-04-19 ENCOUNTER — Encounter: Payer: Self-pay | Admitting: *Deleted

## 2018-05-23 ENCOUNTER — Telehealth: Payer: Self-pay | Admitting: *Deleted

## 2018-05-23 NOTE — Telephone Encounter (Signed)
Copied from [place] [phone] [id number]. Topic: Referral - Request for Referral >> May 23, 2018  2:35 PM Ivar Drape wrote: Has patient seen PCP for this complaint?  Yes, she stated it was time for him to have one. *If NO, is insurance requiring patient see PCP for this issue before PCP can refer them? Referral for which specialty:   GI Preferred provider/office: The last one he had Reason for referral:  Patient is experiencing lower stomach pain on left side and very painful in the rear end.

## 2018-05-25 NOTE — Telephone Encounter (Signed)
Author phoned pt. to follow-up on L stomach pain. Pt. Stated pain is sharp, intermittent in L lower quadrant X 2 weeks and pt. has noticed audible gurgling at site as well. Pt. states he still has his gallbladder, with last RUQ U/S  done in September, no findings. Hx appendectomy during teenage years. Pt. denies fever, or changes in diet, and states he has had BM daily, no changes in bowel habits. Pt also endorses blood when wiping, has history of hemmorhoids. Pt's last colonoscopy 07/2016 in which he had several polyps removed per pt, and has seen GI at [facility] in the past. Pt. Wants to know if he needs referral to GI to be seen, or if he can just call to schedule appointment, or if there is another course of action Dr. Etter Sjogren recommends. Routed to Dr. Etter Sjogren to advise.

## 2018-05-25 NOTE — Telephone Encounter (Signed)
If this is a new problem he would need to be seen first for referral  LLQ pain--- I would be concerned about diverticulitis Can we see him tomorrow?  We may be able to help some --- it may take a while to get him into GI -- his last visit was 2018

## 2018-05-25 NOTE — Telephone Encounter (Signed)
Author phoned pt. to relay Dr. Nonda Lou request for pt. to be seen next week. Author spoke to wife, and wife make appointment for husband for 12/3. Routed to Dr. Etter Sjogren as Juluis Rainier.

## 2018-05-31 ENCOUNTER — Encounter: Payer: Self-pay | Admitting: Family Medicine

## 2018-05-31 ENCOUNTER — Ambulatory Visit (INDEPENDENT_AMBULATORY_CARE_PROVIDER_SITE_OTHER): Payer: Medicare Other | Admitting: Family Medicine

## 2018-05-31 VITALS — BP 137/64 | HR 60 | Temp 98.0°F | Resp 16 | Ht 70.0 in | Wt 211.0 lb

## 2018-05-31 DIAGNOSIS — K625 Hemorrhage of anus and rectum: Secondary | ICD-10-CM

## 2018-05-31 DIAGNOSIS — R1032 Left lower quadrant pain: Secondary | ICD-10-CM

## 2018-05-31 DIAGNOSIS — K649 Unspecified hemorrhoids: Secondary | ICD-10-CM | POA: Diagnosis not present

## 2018-05-31 HISTORY — DX: Left lower quadrant pain: R10.32

## 2018-05-31 LAB — CBC WITH DIFFERENTIAL/PLATELET
BASOS ABS: 0 10*3/uL (ref 0.0–0.1)
Basophils Relative: 0.7 % (ref 0.0–3.0)
Eosinophils Absolute: 0.1 10*3/uL (ref 0.0–0.7)
Eosinophils Relative: 1.8 % (ref 0.0–5.0)
HEMATOCRIT: 43.4 % (ref 39.0–52.0)
Hemoglobin: 14.3 g/dL (ref 13.0–17.0)
Lymphocytes Relative: 30.2 % (ref 12.0–46.0)
Lymphs Abs: 2.1 10*3/uL (ref 0.7–4.0)
MCHC: 32.9 g/dL (ref 30.0–36.0)
MCV: 78.7 fl (ref 78.0–100.0)
Monocytes Absolute: 0.7 10*3/uL (ref 0.1–1.0)
Monocytes Relative: 10.1 % (ref 3.0–12.0)
Neutro Abs: 4 10*3/uL (ref 1.4–7.7)
Neutrophils Relative %: 57.2 % (ref 43.0–77.0)
Platelets: 211 10*3/uL (ref 150.0–400.0)
RBC: 5.52 Mil/uL (ref 4.22–5.81)
RDW: 16.4 % — ABNORMAL HIGH (ref 11.5–15.5)
WBC: 7 10*3/uL (ref 4.0–10.5)

## 2018-05-31 LAB — COMPREHENSIVE METABOLIC PANEL
ALT: 26 U/L (ref 0–53)
AST: 24 U/L (ref 0–37)
Albumin: 4.2 g/dL (ref 3.5–5.2)
Alkaline Phosphatase: 94 U/L (ref 39–117)
BUN: 16 mg/dL (ref 6–23)
CO2: 26 mEq/L (ref 19–32)
Calcium: 9.1 mg/dL (ref 8.4–10.5)
Chloride: 104 mEq/L (ref 96–112)
Creatinine, Ser: 1.25 mg/dL (ref 0.40–1.50)
GFR: 61.13 mL/min (ref >60.00)
Glucose, Bld: 94 mg/dL (ref 70–99)
POTASSIUM: 4.1 meq/L (ref 3.5–5.1)
Sodium: 137 mEq/L (ref 135–145)
Total Bilirubin: 1.3 mg/dL — ABNORMAL HIGH (ref 0.2–1.2)
Total Protein: 6.8 g/dL (ref 6.0–8.3)

## 2018-05-31 MED ORDER — HYDROCORTISONE ACE-PRAMOXINE 1-1 % RE FOAM: 1.0000 | Freq: Two times a day (BID) | RECTAL | 0 refills | Status: DC

## 2018-05-31 NOTE — Assessment & Plan Note (Signed)
R/o diverticulitis   Ct and labs today

## 2018-05-31 NOTE — Progress Notes (Signed)
Patient ID: Anthony King, male    DOB: [birth year]  Age: 67 y.o. MRN: [id number]    Subjective:  Subjective  HPI Anthony King presents for LLQ pain -- no diarrhea or constipation   He is also c/o hemorrhoid pain.   One bled through his clothes at work this week.  It is still causing pain   Review of Systems  Constitutional: Negative for appetite change, diaphoresis, fatigue and unexpected weight change.  Eyes: Negative for pain, redness and visual disturbance.  Respiratory: Negative for cough, chest tightness, shortness of breath and wheezing.   Cardiovascular: Negative for chest pain, palpitations and leg swelling.  Gastrointestinal: Positive for abdominal pain, anal bleeding and rectal pain.  Endocrine: Negative for cold intolerance, heat intolerance, polydipsia, polyphagia and polyuria.  Genitourinary: Negative for difficulty urinating, dysuria and frequency.  Neurological: Negative for dizziness, light-headedness, numbness and headaches.    History Past Medical History:  Diagnosis Date  . Arthritis 07-17-11   hands, hips, knees  . Basal cell carcinoma    nose  . Chronic kidney disease 07-17-11   past kidney stone x1  . Colon polyps    ADENOMATOUS AND HYPERPLASTIC POLYPS  . Diverticulosis   . Gastric ulcer   . GERD (gastroesophageal reflux disease)   . Hemorrhoids   . Hyperlipidemia   . Wears glasses     He has a past surgical history that includes Appendectomy; fatty tumors (07-17-11); Inguinal hernia repair (07/23/2011); Colonoscopy; Excision basal cell carcinoma; Mass excision (N/A, 08/29/2013); and LEFT HEART CATH AND CORONARY ANGIOGRAPHY (N/A, 03/21/2018).   His family history includes Arthritis in his maternal grandmother, mother, and paternal grandmother; Breast cancer in his mother; Colon cancer in his sister; Stomach cancer in his father and sister.He reports that he quit smoking about 26 years ago. He has never used smokeless tobacco. He reports that he does  not drink alcohol or use drugs.  Current Outpatient Medications on File Prior to Visit  Medication Sig Dispense Refill  . atorvastatin (LIPITOR) 40 MG tablet Take 1 tablet (40 mg total) by mouth daily at 6 PM. 30 tablet 0  . lansoprazole (PREVACID) 30 MG capsule Take 30 mg by mouth daily at 12 noon.    . Multiple Vitamin (MULTIVITAMIN WITH MINERALS) TABS tablet Take 1 tablet by mouth daily.    . vitamin B-12 (CYANOCOBALAMIN) 1000 MCG tablet Take 1,000 mcg by mouth daily.      No current facility-administered medications on file prior to visit.      Objective:  Objective  Physical Exam  Constitutional: He is oriented to person, place, and time. Vital signs are normal. He appears well-developed and well-nourished. He is sleeping.  HENT:  Head: Normocephalic and atraumatic.  Mouth/Throat: Oropharynx is clear and moist.  Eyes: Pupils are equal, round, and reactive to light. EOM are normal.  Neck: Normal range of motion. Neck supple. No thyromegaly present.  Cardiovascular: Normal rate and regular rhythm.  No murmur heard. Pulmonary/Chest: Effort normal and breath sounds normal. No respiratory distress. He has no wheezes. He has no rales. He exhibits no tenderness.  Abdominal: Soft. Bowel sounds are normal. There is tenderness in the left lower quadrant. There is guarding. There is no rigidity, no rebound, no tenderness at McBurney's point and negative Murphy's sign. No hernia.  Rectal-- + small ext hemorrhoid   Heme neg brown stool  Genitourinary: Rectal exam shows guaiac negative stool.  Musculoskeletal: He exhibits no edema or tenderness.  Neurological: He is alert and  oriented to person, place, and time.  Skin: Skin is warm and dry.  Psychiatric: He has a normal mood and affect. His behavior is normal. Judgment and thought content normal.  Nursing note and vitals reviewed.  BP 137/64 (BP Location: Left Arm, Cuff Size: Large)   Pulse 60   Temp 98 F (36.7 C) (Oral)   Resp 16   Ht  5\' 10"  (1.778 m)   Wt 211 lb (95.7 kg)   SpO2 100%   BMI 30.28 kg/m  Wt Readings from Last 3 Encounters:  05/31/18 211 lb (95.7 kg)  04/18/18 209 lb (94.8 kg)  03/21/18 205 lb 14.4 oz (93.4 kg)     Lab Results  Component Value Date   WBC 7.0 04/18/2018   HGB 14.4 04/18/2018   HCT 43.7 04/18/2018   PLT 179.0 04/18/2018   GLUCOSE 104 (H) 04/18/2018   CHOL 207 (H) 03/19/2018   TRIG 120 03/19/2018   HDL 33 (L) 03/19/2018   LDLDIRECT 141.0 06/11/2016   LDLCALC 150 (H) 03/19/2018   ALT 20 04/18/2018   AST 20 04/18/2018   NA 141 04/18/2018   K 4.6 04/18/2018   CL 106 04/18/2018   CREATININE 1.24 04/18/2018   BUN 19 04/18/2018   CO2 28 04/18/2018   PSA 0.68 06/11/2016   INR 1.01 03/18/2018   HGBA1C 5.7 (H) 03/18/2018    Dg Chest Portable 1 View  Result Date: 03/18/2018 CLINICAL DATA:  Chest pain. EXAM: PORTABLE CHEST 1 VIEW COMPARISON:  Rib series 10/12/2016. FINDINGS: Mediastinum and hilar structures normal. Heart size stable. Lungs are clear. Small right pleural effusion. Heart size normal. No acute bony abnormality. IMPRESSION: 1.  Small right pleural effusion. 2.  Exam is otherwise unremarkable. Electronically Signed   By: Marcello Moores  Register   On: 03/18/2018 11:45   Ct Angio Chest/abd/pel For Dissection W And/or Wo Contrast  Result Date: 03/18/2018 CLINICAL DATA:  Sudden onset chest pain radiating to the back. EXAM: CT ANGIOGRAPHY CHEST, ABDOMEN AND PELVIS TECHNIQUE: Multidetector CT imaging through the chest, abdomen and pelvis was performed using the standard protocol during bolus administration of intravenous contrast. Multiplanar reconstructed images and MIPs were obtained and reviewed to evaluate the vascular anatomy. CONTRAST:  176mL ISOVUE-370 IOPAMIDOL (ISOVUE-370) INJECTION 76% COMPARISON:  Chest radiograph from earlier today. FINDINGS: CTA CHEST FINDINGS Cardiovascular: Normal heart size. No significant pericardial effusion/thickening. Left anterior descending coronary  atherosclerosis. Normal course and caliber of the mildly atherosclerotic thoracic aorta. No evidence of acute intramural hematoma, dissection, pseudoaneurysm or penetrating atherosclerotic ulcer in the thoracic aorta. Patent aortic arch branch vessels. Normal caliber pulmonary arteries. No central pulmonary emboli. Mediastinum/Nodes: No discrete thyroid nodules. Unremarkable esophagus. No pathologically enlarged axillary, mediastinal or hilar lymph nodes. Lungs/Pleura: No pneumothorax. No pleural effusion. No acute consolidative airspace disease, lung masses or significant pulmonary nodules. Musculoskeletal: Sclerotic exophytic 1.4 cm lesion at the lateral lower right scapula (series 7/image 69) without overtly aggressive features, favor an osteoma. Moderate thoracic spondylosis. Review of the MIP images confirms the above findings. CTA ABDOMEN AND PELVIS FINDINGS VASCULAR Aorta: Normal caliber mildly atherosclerotic aorta without aneurysm, dissection, vasculitis or significant stenosis. Celiac: Patent without evidence of aneurysm, dissection, vasculitis or significant stenosis. SMA: Patent without evidence of aneurysm, dissection, vasculitis or significant stenosis. Renals: Bilateral renal arteries are patent without evidence of aneurysm, dissection, vasculitis, fibromuscular dysplasia or significant stenosis. IMA: Patent without evidence of aneurysm, dissection, vasculitis or significant stenosis. Inflow: Patent without evidence of aneurysm, dissection, vasculitis or significant stenosis. Veins: No  obvious venous abnormality within the limitations of this arterial phase study. Review of the MIP images confirms the above findings. NON-VASCULAR Hepatobiliary: Normal liver with no liver mass. Possible tiny gallstone in the gallbladder neck. No gallbladder wall thickening or pericholecystic fluid. No biliary ductal dilatation. Pancreas: Normal, with no mass or duct dilation. Spleen: Normal size. No mass.  Adrenals/Urinary Tract: Normal adrenals. No hydronephrosis. No contour deforming renal masses. Normal bladder. Stomach/Bowel: Normal non-distended stomach. Normal caliber small bowel with no small bowel wall thickening. Appendectomy. Mild left colonic diverticulosis, most prominent in the sigmoid colon, with no large bowel wall thickening or pericolonic fat stranding. Vascular/Lymphatic: Atherosclerotic nonaneurysmal abdominal aorta. No pathologically enlarged lymph nodes in the abdomen or pelvis. Reproductive: Top-normal size prostate. Other: No pneumoperitoneum, ascites or focal fluid collection. Musculoskeletal: No aggressive appearing focal osseous lesions. Nonspecific small sclerotic lesion in the upper left sacrum, most likely a benign bone island. Moderate lumbar spondylosis. Review of the MIP images confirms the above findings. IMPRESSION: 1. No acute aortic syndrome. 2. One vessel coronary atherosclerosis. 3. Possible cholelithiasis.  No evidence of acute cholecystitis. 4. Mild left colonic diverticulosis. 5.  Aortic Atherosclerosis (ICD10-I70.0). Electronically Signed   By: Ilona Sorrel M.D.   On: 03/18/2018 12:42   US Abdomen Limited Ruq  Result Date: 03/18/2018 CLINICAL DATA:  Chest pain. EXAM: ULTRASOUND ABDOMEN LIMITED RIGHT UPPER QUADRANT COMPARISON:  None. FINDINGS: Gallbladder: No gallstones or wall thickening visualized. No sonographic Murphy sign noted by sonographer. Maximal wall thickness is 1.5 mm, within normal limits Common bile duct: Diameter: 3.7 mm, within normal limits Liver: No focal lesion identified. Within normal limits in parenchymal echogenicity. Portal vein is patent on color Doppler imaging with normal direction of blood flow towards the liver. IMPRESSION: Negative right upper quadrant ultrasound. No acute or focal lesion explain the patient's chest pain. Electronically Signed   By: San Morelle M.D.   On: 03/18/2018 14:41     Assessment & Plan:  Plan  I am having  Otila Back. Liddell start on hydrocortisone-pramoxine. I am also having him maintain his vitamin B-12, lansoprazole, multivitamin with minerals, and atorvastatin.  Meds ordered this encounter  Medications  . hydrocortisone-pramoxine (PROCTOFOAM HC) rectal foam    Sig: Place 1 applicator rectally 2 (two) times daily.    Dispense:  10 g    Refill:  0    Problem List Items Addressed This Visit      Unprioritized   Left lower quadrant abdominal pain    R/o diverticulitis   Ct and labs today       Relevant Orders   CT Abdomen Pelvis W Contrast   CBC with Differential/Platelet   Comprehensive metabolic panel    Other Visit Diagnoses    Hemorrhoids, unspecified hemorrhoid type    -  Primary   Relevant Medications   hydrocortisone-pramoxine (PROCTOFOAM HC) rectal foam   Other Relevant Orders   Ambulatory referral to Gastroenterology   Rectal bleed       Relevant Orders   Ambulatory referral to Gastroenterology      Follow-up: Return if symptoms worsen or fail to improve.  Ann Held, DO

## 2018-05-31 NOTE — Patient Instructions (Signed)
Hemorrhoids Hemorrhoids are swollen veins in and around the rectum or anus. There are two types of hemorrhoids:  Internal hemorrhoids. These occur in the veins that are just inside the rectum. They may poke through to the outside and become irritated and painful.  External hemorrhoids. These occur in the veins that are outside of the anus and can be felt as a painful swelling or hard lump near the anus.  Most hemorrhoids do not cause serious problems, and they can be managed with home treatments such as diet and lifestyle changes. If home treatments do not help your symptoms, procedures can be done to shrink or remove the hemorrhoids. What are the causes? This condition is caused by increased pressure in the anal area. This pressure may result from various things, including:  Constipation.  Straining to have a bowel movement.  Diarrhea.  Pregnancy.  Obesity.  Sitting for long periods of time.  Heavy lifting or other activity that causes you to strain.  Anal sex.  What are the signs or symptoms? Symptoms of this condition include:  Pain.  Anal itching or irritation.  Rectal bleeding.  Leakage of stool (feces).  Anal swelling.  One or more lumps around the anus.  How is this diagnosed? This condition can often be diagnosed through a visual exam. Other exams or tests may also be done, such as:  Examination of the rectal area with a gloved hand (digital rectal exam).  Examination of the anal canal using a small tube (anoscope).  A blood test, if you have lost a significant amount of blood.  A test to look inside the colon (sigmoidoscopy or colonoscopy).  How is this treated? This condition can usually be treated at home. However, various procedures may be done if dietary changes, lifestyle changes, and other home treatments do not help your symptoms. These procedures can help make the hemorrhoids smaller or remove them completely. Some of these procedures involve  surgery, and others do not. Common procedures include:  Rubber band ligation. Rubber bands are placed at the base of the hemorrhoids to cut off the blood supply to them.  Sclerotherapy. Medicine is injected into the hemorrhoids to shrink them.  Infrared coagulation. A type of light energy is used to get rid of the hemorrhoids.  Hemorrhoidectomy surgery. The hemorrhoids are surgically removed, and the veins that supply them are tied off.  Stapled hemorrhoidopexy surgery. A circular stapling device is used to remove the hemorrhoids and use staples to cut off the blood supply to them.  Follow these instructions at home: Eating and drinking  Eat foods that have a lot of fiber in them, such as whole grains, beans, nuts, fruits, and vegetables. Ask your health care provider about taking products that have added fiber (fiber supplements).  Drink enough fluid to keep your urine clear or pale yellow. Managing pain and swelling  Take warm sitz baths for 20 minutes, 3-4 times a day to ease pain and discomfort.  If directed, apply ice to the affected area. Using ice packs between sitz baths may be helpful. ? Put ice in a plastic bag. ? Place a towel between your skin and the bag. ? Leave the ice on for 20 minutes, 2-3 times a day. General instructions  Take over-the-counter and prescription medicines only as told by your health care provider.  Use medicated creams or suppositories as told.  Exercise regularly.  Go to the bathroom when you have the urge to have a bowel movement. Do not wait.    Avoid straining to have bowel movements.  Keep the anal area dry and clean. Use wet toilet paper or moist towelettes after a bowel movement.  Do not sit on the toilet for long periods of time. This increases blood pooling and pain. Contact a health care provider if:  You have increasing pain and swelling that are not controlled by treatment or medicine.  You have uncontrolled bleeding.  You  have difficulty having a bowel movement, or you are unable to have a bowel movement.  You have pain or inflammation outside the area of the hemorrhoids. This information is not intended to replace advice given to you by your health care provider. Make sure you discuss any questions you have with your health care provider. Document Released: 06/12/2000 Document Revised: 11/13/2015 Document Reviewed: 02/27/2015 Elsevier Interactive Patient Education  2018 [employer]  

## 2018-06-01 ENCOUNTER — Ambulatory Visit (HOSPITAL_BASED_OUTPATIENT_CLINIC_OR_DEPARTMENT_OTHER)
Admission: RE | Admit: 2018-06-01 | Discharge: 2018-06-01 | Disposition: A | Discharge status: Home / Self Care | Payer: Medicare Other | Source: Ambulatory Visit | Attending: Family Medicine | Admitting: Family Medicine

## 2018-06-01 ENCOUNTER — Encounter (HOSPITAL_BASED_OUTPATIENT_CLINIC_OR_DEPARTMENT_OTHER): Payer: Self-pay

## 2018-06-01 DIAGNOSIS — R1032 Left lower quadrant pain: Secondary | ICD-10-CM

## 2018-06-01 DIAGNOSIS — K8020 Calculus of gallbladder without cholecystitis without obstruction: Secondary | ICD-10-CM | POA: Diagnosis not present

## 2018-06-01 DIAGNOSIS — N200 Calculus of kidney: Secondary | ICD-10-CM | POA: Diagnosis not present

## 2018-06-01 MED ORDER — IOPAMIDOL (ISOVUE-300) INJECTION 61%
100.0000 mL | Freq: Once | INTRAVENOUS | Status: AC | PRN
  Administered 2018-06-01: 100 mL via INTRAVENOUS

## 2018-06-02 ENCOUNTER — Ambulatory Visit (INDEPENDENT_AMBULATORY_CARE_PROVIDER_SITE_OTHER): Payer: Medicare Other | Admitting: Gastroenterology

## 2018-06-02 ENCOUNTER — Encounter: Payer: Self-pay | Admitting: Gastroenterology

## 2018-06-02 VITALS — BP 112/70 | HR 72 | Ht 69.75 in | Wt 208.4 lb

## 2018-06-02 DIAGNOSIS — R1032 Left lower quadrant pain: Secondary | ICD-10-CM

## 2018-06-02 DIAGNOSIS — K648 Other hemorrhoids: Secondary | ICD-10-CM

## 2018-06-02 DIAGNOSIS — K602 Anal fissure, unspecified: Secondary | ICD-10-CM

## 2018-06-02 DIAGNOSIS — K8020 Calculus of gallbladder without cholecystitis without obstruction: Secondary | ICD-10-CM

## 2018-06-02 MED ORDER — AMBULATORY NON FORMULARY MEDICATION: 0 refills | Status: DC

## 2018-06-02 NOTE — Patient Instructions (Addendum)
If you are age 67 or older, your body mass index should be between 23-30. Your Body mass index is 30.11 kg/m. If this is out of the aforementioned range listed, please consider follow up with your Primary Care Provider.  If you are age 73 or younger, your body mass index should be between 19-25. Your Body mass index is 30.11 kg/m. If this is out of the aformentioned range listed, please consider follow up with your Primary Care Provider.   We have sent a prescription for nitroglycerin 0.125% gel to [facility]. You should apply a pea size amount to your rectum three times daily x 6-8 weeks.  [facility]'s information is below: Address: [address] [place],  [address]  [phone]  *Please DO NOT go directly from our office to pick up this medication! Give the pharmacy 1 day to process the prescription as this is compounded at takes time to make.  Please use 1% hydrocortisone cream twice a day WITH the nitroglycerin gel:  Apply a pea sized amount to the nitroglycerin twice day before inserting in rectum.  We are giving you samples of Recticare today.  You can use twice a day as needed.  Please purchase the following medications over the counter and take as directed: Daily fiber supplement, such as Benefiber or Metamucil.   Thank you for entrusting me with your care and for choosing [facility], Dr. [place]

## 2018-06-02 NOTE — Progress Notes (Signed)
HPI :  67 year old male with a history of colon adenomas, I know him from his last colonoscopy in February 2018, here for a follow-up visit for rectal pain and rectal bleeding.  He reports about 3 weeks ago he felt a hemorrhoid prolapse that was associated with significant discomfort. He manually reduced it which provided some relief. He reports she still has some discomfort however in his anal rectal area with bowel movements. He otherwise has been noticing intermittent bright red blood on the toilet paper and in the toilet bowl, he's had a few episodes of what he thought was a significant amount of blood. Last week he had blood with most bowel movements, totaling occurred once this week however, appears to be getting better. He denies any straining or constipation. He denies any problems with hemorrhoids prior to this. He did have hemorrhoids noted internally on colonoscopy last year.  He otherwise has had some left lower quadrant abdominal pain and recent weeks. It comes and goes, he can't really think of anything that associated with. Doesn't appear to be worse with lifting anything, although he works in Garment/textile technologist, etc. He denies any change of this pain with bowel habits. He had a CT Scan done yesterday which did not show any acute cause. He does have bilateral inguinal hernias, however his pain is not in his groin and is more so along his belt line. He did incidentally have some gallstones noted and a nonobstructive renal stone. He denies any significant postprandial abdominal pain that bother him. He was admitted to the hospital in September with chest pain for which he had a negative cardiac catheterization, and CT scan of the chest. His pain resolved on its own. In general he does not endorse any more typical biliary colic symptoms  Colonoscopy 08/17/2016 - 11 small polyps removed, diverticulosis, hemorrhoids - 6 polyps c/w adenoma - repeat colonoscopy in 2021   Past  Medical History:  Diagnosis Date  . Arthritis 07-17-11   hands, hips, knees  . Basal cell carcinoma    nose  . Chronic kidney disease 07-17-11   past kidney stone x1  . Colon polyps    ADENOMATOUS AND HYPERPLASTIC POLYPS  . Diverticulosis   . Gastric ulcer   . GERD (gastroesophageal reflux disease)   . Hemorrhoids   . Hyperlipidemia   . Wears glasses      Past Surgical History:  Procedure Laterality Date  . APPENDECTOMY    . BASAL CELL CARCINOMA EXCISION     nose  . COLONOSCOPY    . fatty tumors  07-17-11   excised x4- 1 -neck, 1 arm, 2 chest  . INGUINAL HERNIA REPAIR  07/23/2011   Procedure: LAPAROSCOPIC INGUINAL HERNIA;  Surgeon: Stark Klein, MD;  Location: [facility];  Service: General;  Laterality: Left;  . LEFT HEART CATH AND CORONARY ANGIOGRAPHY N/A 03/21/2018   Procedure: LEFT HEART CATH AND CORONARY ANGIOGRAPHY;  Surgeon: Jettie Booze, MD;  Location: [facility];  Service: Cardiovascular;  Laterality: N/A;  . MASS EXCISION N/A 08/29/2013   Procedure: EXCISION LEFT FLANK MASS/RIGHT EXCISION CHEST WALL MASS;  Surgeon: Stark Klein, MD;  Location: [facility];  Service: General;  Laterality: N/A;  Left flank   Family History  Problem Relation Age of Onset  . Stomach cancer Father   . Stomach cancer Sister        Passed away 06/25/23  . Colon cancer Sister  mets from stomach  . Arthritis Mother   . Breast cancer Mother   . Arthritis Paternal Grandmother   . Arthritis Maternal Grandmother   . Esophageal cancer Neg Hx    Social History   Tobacco Use  . Smoking status: Former Smoker    Last attempt to quit: 07/02/1991    Years since quitting: 26.9  . Smokeless tobacco: Never Used  Substance Use Topics  . Alcohol use: No  . Drug use: No   Current Outpatient Medications  Medication Sig Dispense Refill  . lansoprazole (PREVACID) 30 MG capsule Take 30 mg by mouth daily at 12 noon.    . Multiple Vitamin (MULTIVITAMIN WITH MINERALS) TABS  tablet Take 1 tablet by mouth daily.    . vitamin B-12 (CYANOCOBALAMIN) 1000 MCG tablet Take 1,000 mcg by mouth daily.     . hydrocortisone-pramoxine (PROCTOFOAM HC) rectal foam Place 1 applicator rectally 2 (two) times daily. (Patient not taking: Reported on 06/02/2018) 10 g 0   No current facility-administered medications for this visit.    Allergies  Allergen Reactions  . Aspirin Other (See Comments)    REACTION: nauseated, drooling     Review of Systems: All systems reviewed and negative except where noted in HPI.    Ct Abdomen Pelvis W Contrast  Result Date: 06/01/2018 CLINICAL DATA:  Left lower quadrant pain and bloody stools for 1-1.5 months. EXAM: CT ABDOMEN AND PELVIS WITH CONTRAST TECHNIQUE: Multidetector CT imaging of the abdomen and pelvis was performed using the standard protocol following bolus administration of intravenous contrast. CONTRAST:  140mL ISOVUE-300 IOPAMIDOL (ISOVUE-300) INJECTION 61% COMPARISON:  Right upper quadrant abdominal ultrasound 03/18/2018 FINDINGS: Lower chest: Minimal dependent atelectasis in the lung bases. No pleural effusion. Hepatobiliary: No focal liver abnormality is seen. A small calcified stone is noted in the gallbladder without evidence of acute inflammation or biliary dilatation. Pancreas: Unremarkable. Spleen: Unremarkable. Adrenals/Urinary Tract: Unremarkable adrenal glands. Mild right renal scarring. 3 mm calculus in the interpolar right kidney. No hydronephrosis. Left renal parapelvic cysts. Unremarkable bladder. Stomach/Bowel: The stomach is unremarkable. There is diverticulosis of the sigmoid and to a lesser extent descending colon without evidence of diverticulitis. There is no bowel obstruction. Prior appendectomy. Vascular/Lymphatic: Mild abdominal aortic atherosclerosis without aneurysm. No enlarged lymph nodes. Reproductive: Unremarkable prostate. Bilateral varicoceles. Other: Bilateral fat containing inguinal hernias. No ascites or  pneumoperitoneum. Musculoskeletal: No acute osseous abnormality or suspicious osseous lesion. IMPRESSION: 1. No acute abnormality identified in the abdomen or pelvis. 2. Colonic diverticulosis without evidence of diverticulitis. 3. Cholelithiasis. 4. Nonobstructing right nephrolithiasis. 5.  Aortic Atherosclerosis (ICD10-I70.0). Electronically Signed   By: Logan Bores M.D.   On: 06/01/2018 11:51   Lab Results  Component Value Date   WBC 7.0 05/31/2018   HGB 14.3 05/31/2018   HCT 43.4 05/31/2018   MCV 78.7 05/31/2018   PLT 211.0 05/31/2018    Lab Results  Component Value Date   CREATININE 1.25 05/31/2018   BUN 16 05/31/2018   NA 137 05/31/2018   K 4.1 05/31/2018   CL 104 05/31/2018   CO2 26 05/31/2018    Lab Results  Component Value Date   ALT 26 05/31/2018   AST 24 05/31/2018   ALKPHOS 94 05/31/2018   BILITOT 1.3 (H) 05/31/2018      Physical Exam: BP 112/70 (BP Location: Left Arm, Patient Position: Sitting, Cuff Size: Normal)   Pulse 72   Ht 5' 9.75" (1.772 m) Comment: height measured without shoes  Wt 208 lb 6 oz (  94.5 kg)   BMI 30.11 kg/m  Constitutional: Pleasant,well-developed, male in no acute distress. HEENT: Normocephalic and atraumatic. Conjunctivae are normal. No scleral icterus. Neck supple.  Cardiovascular: Normal rate, regular rhythm.  Pulmonary/chest: Effort normal and breath sounds normal. No wheezing, rales or rhonchi. Abdominal: Soft, nondistended, nontender. there are no masses palpable. No hepatomegaly. DRE / Anoscopy - posterior midline anal canal fissure - severe tenderness with palpation, large internal hemorrhoids / inflamed, no mass lesion Extremities: no edema Lymphadenopathy: No cervical adenopathy noted. Neurological: Alert and oriented to person place and time. Skin: Skin is warm and dry. No rashes noted. Psychiatric: Normal mood and affect. Behavior is normal.   ASSESSMENT AND PLAN: 67 year old male here for follow-up  visit:  Internal hemorrhoids / anal fissure - recent symptoms of rectal bleeding and anal discomfort. His hemorrhoids are quite large and inflamed and I suspect related to his symptoms, however he is exquisitely tender to palpation at an area of a small suspected fissure. While I think he would ultimately benefit from hemorrhoid banding given his recent symptoms, in light of his discomfort today I don't think he will tolerate it right now. Recommend treating the fissure with topical nitroglycerin ointment 0.125% 3 times a day. Also provided some recti care samples to help with the pain. Recommend she use a daily fiber supplement to keep stools soft and prevent straining. Also recommend he use some topical 1% hydrocortisone cream along with nitroglycerin to help reduce inflammation from hemorrhoids. We'll see how he does with this over the next few weeks, he will see me again in 3 weeks or so. If the fissure can heal and his pain goes away, we'll then proceed with hemorrhoid banding to prevent recurrence. He agreed with the plan as outlined, will call me in the interim with any questions.  Abdominal pain - left lower quadrant along his belt line. No clear cause on CT scan. He has no pain in the inguinal canal at the site of his hernias, do not think it is related. I otherwise did not see any clear abnormality on CT scan to cause his symptoms. This could be musculoskeletal, along with align, recommend he loosen his belt and do not wear tight fitting close, he'll monitor for now, seems mild currently  Gallstones - counseled him on biliary colic and potential associated risks of gallstones. His recent symptoms in September during admission would seem atypical for gallstones, his [place], LFTs, labs were normal at the time. If he develops biliary colic in the future which I counseled him on he will let me know.  [place] Cellar, MD [facility]

## 2018-06-07 ENCOUNTER — Encounter: Payer: Self-pay | Admitting: Family Medicine

## 2018-06-10 DIAGNOSIS — K219 Gastro-esophageal reflux disease without esophagitis: Secondary | ICD-10-CM | POA: Diagnosis not present

## 2018-06-10 DIAGNOSIS — N182 Chronic kidney disease, stage 2 (mild): Secondary | ICD-10-CM | POA: Diagnosis not present

## 2018-06-10 DIAGNOSIS — Z87442 Personal history of urinary calculi: Secondary | ICD-10-CM | POA: Diagnosis not present

## 2018-06-16 DIAGNOSIS — K625 Hemorrhage of anus and rectum: Secondary | ICD-10-CM

## 2018-06-24 ENCOUNTER — Encounter: Payer: Self-pay | Admitting: Gastroenterology

## 2018-06-24 ENCOUNTER — Ambulatory Visit (INDEPENDENT_AMBULATORY_CARE_PROVIDER_SITE_OTHER): Payer: Medicare Other | Admitting: Gastroenterology

## 2018-06-24 VITALS — BP 114/78 | HR 76 | Ht 69.75 in | Wt 207.4 lb

## 2018-06-24 DIAGNOSIS — K642 Third degree hemorrhoids: Secondary | ICD-10-CM

## 2018-06-24 NOTE — Progress Notes (Signed)
67 y/o male here for follow up. He has had bleeding symptoms from hemorrhoids but also had pain and anal fissure noted. He was treated with topical nitroglycerin in recent weeks. This has significantly improved his symptoms of pain. He had some mild bleeding this AM. He has grade III hemorrhoids. We have discussed options for his hemorrhoids moving forward, and he wishes to proceed with hemorrhoid banding after discussion of risks / benefits.    PROCEDURE NOTE: The patient presents with symptomatic grade III  hemorrhoids, requesting rubber band ligation of his/her hemorrhoidal disease.  All risks, benefits and alternative forms of therapy were described and informed consent was obtained.   The anorectum was pre-medicated with 0.125% nitroglycerin The decision was made to band the LL internal hemorrhoid, and the [place] was used to perform band ligation without complication.  Digital anorectal examination was then performed to assure proper positioning of the band, and to adjust the banded tissue as required.  The patient was discharged home without pain or other issues.  Dietary and behavioral recommendations were given and along with follow-up instructions.     The following adjunctive treatments were recommended: Continue nitroglycerin as needed  The patient will return in 2-4 weeks for  follow-up and possible additional banding as required. No complications were encountered and the patient tolerated the procedure well.  [place]eboy[place] Cellar, MD [facility]

## 2018-06-24 NOTE — Patient Instructions (Signed)
If you are age 67 or older, your body mass index should be between 23-30. Your Body mass index is 29.97 kg/m. If this is out of the aforementioned range listed, please consider follow up with your Primary Care Provider.  If you are age 19 or younger, your body mass index should be between 19-25. Your Body mass index is 29.97 kg/m. If this is out of the aformentioned range listed, please consider follow up with your Primary Care Provider.   HEMORRHOID BANDING PROCEDURE    FOLLOW-UP CARE   1. The procedure you have had should have been relatively painless since the banding of the area involved does not have nerve endings and there is no pain sensation.  The rubber band cuts off the blood supply to the hemorrhoid and the band may fall off as soon as 48 hours after the banding (the band may occasionally be seen in the toilet bowl following a bowel movement). You may notice a temporary feeling of fullness in the rectum which should respond adequately to plain Tylenol or Motrin.  2. Following the banding, avoid strenuous exercise that evening and resume full activity the next day.  A sitz bath (soaking in a warm tub) or bidet is soothing, and can be useful for cleansing the area after bowel movements.     3. To avoid constipation, take two tablespoons of natural wheat bran, natural oat bran, flax, Benefiber or any over the counter fiber supplement and increase your water intake to 7-8 glasses daily.    4. Unless you have been prescribed anorectal medication, do not put anything inside your rectum for two weeks: No suppositories, enemas, fingers, etc.  5. Occasionally, you may have more bleeding than usual after the banding procedure.  This is often from the untreated hemorrhoids rather than the treated one.  Don't be concerned if there is a tablespoon or so of blood.  If there is more blood than this, lie flat with your bottom higher than your head and apply an ice pack to the area. If the bleeding  does not stop within a half an hour or if you feel faint, call our office at [phone] or go to the emergency room.  6. Problems are not common; however, if there is a substantial amount of bleeding, severe pain, chills, fever or difficulty passing urine (very rare) or other problems, you should call us at [phone] or report to the nearest emergency room.  7. Do not stay seated continuously for more than 2-3 hours for a day or two after the procedure.  Tighten your buttock muscles 10-15 times every two hours and take 10-15 deep breaths every 1-2 hours.  Do not spend more than a few minutes on the toilet if you cannot empty your bowel; instead re-visit the toilet at a later time.    Thank you for entrusting me with your care and for choosing [facility], Dr. [place] Cellar

## 2018-07-01 ENCOUNTER — Ambulatory Visit: Payer: Medicare Other | Admitting: Interventional Cardiology

## 2018-07-15 ENCOUNTER — Ambulatory Visit (INDEPENDENT_AMBULATORY_CARE_PROVIDER_SITE_OTHER): Payer: Medicare Other | Admitting: Gastroenterology

## 2018-07-15 ENCOUNTER — Encounter: Payer: Self-pay | Admitting: Gastroenterology

## 2018-07-15 VITALS — BP 116/72 | HR 73 | Ht 70.0 in | Wt 210.0 lb

## 2018-07-15 DIAGNOSIS — K642 Third degree hemorrhoids: Secondary | ICD-10-CM

## 2018-07-15 NOTE — Patient Instructions (Signed)
If you are age 68 or older, your body mass index should be between 23-30. Your Body mass index is 30.13 kg/m. If this is out of the aforementioned range listed, please consider follow up with your Primary Care Provider.  If you are age 77 or younger, your body mass index should be between 19-25. Your Body mass index is 30.13 kg/m. If this is out of the aformentioned range listed, please consider follow up with your Primary Care Provider.   HEMORRHOID BANDING PROCEDURE    FOLLOW-UP CARE   1. The procedure you have had should have been relatively painless since the banding of the area involved does not have nerve endings and there is no pain sensation.  The rubber band cuts off the blood supply to the hemorrhoid and the band may fall off as soon as 48 hours after the banding (the band may occasionally be seen in the toilet bowl following a bowel movement). You may notice a temporary feeling of fullness in the rectum which should respond adequately to plain Tylenol or Motrin.  2. Following the banding, avoid strenuous exercise that evening and resume full activity the next day.  A sitz bath (soaking in a warm tub) or bidet is soothing, and can be useful for cleansing the area after bowel movements.     3. To avoid constipation, take two tablespoons of natural wheat bran, natural oat bran, flax, Benefiber or any over the counter fiber supplement and increase your water intake to 7-8 glasses daily.    4. Unless you have been prescribed anorectal medication, do not put anything inside your rectum for two weeks: No suppositories, enemas, fingers, etc.  5. Occasionally, you may have more bleeding than usual after the banding procedure.  This is often from the untreated hemorrhoids rather than the treated one.  Don't be concerned if there is a tablespoon or so of blood.  If there is more blood than this, lie flat with your bottom higher than your head and apply an ice pack to the area. If the bleeding  does not stop within a half an hour or if you feel faint, call our office at [phone] or go to the emergency room.  6. Problems are not common; however, if there is a substantial amount of bleeding, severe pain, chills, fever or difficulty passing urine (very rare) or other problems, you should call us at [phone] or report to the nearest emergency room.  7. Do not stay seated continuously for more than 2-3 hours for a day or two after the procedure.  Tighten your buttock muscles 10-15 times every two hours and take 10-15 deep breaths every 1-2 hours.  Do not spend more than a few minutes on the toilet if you cannot empty your bowel; instead re-visit the toilet at a later time.    Thank you for entrusting me with your care and for choosing [facility], Dr. [place] Cellar

## 2018-07-15 NOTE — Progress Notes (Signed)
68 y/o male here for follow up. He has had bleeding symptoms from hemorrhoids but also had pain and anal fissure noted. He was treated with topical nitroglycerin in recent weeks prior to the banding. He has grade III hemorrhoids, LL banded on 06/24/18. He thinks it has helped quite a bit since I've seen him. No bleeding, prolapse, or problems. Doing well and he tolerated it, he wishes to proceed with further therapy today.   PROCEDURE NOTE: The patient presents with symptomatic grade III  hemorrhoids, requesting rubber band ligation of his/her hemorrhoidal disease.  All risks, benefits and alternative forms of therapy were described and informed consent was obtained.   The anorectum was pre-medicated with 0.125% nitroglycerin  The decision was made to band the RP internal hemorrhoid, and the [place] was used to perform band ligation without complication.  Digital anorectal examination was then performed to assure proper positioning of the band, and to adjust the banded tissue as required.  The patient was discharged home without pain or other issues.  Dietary and behavioral recommendations were given and along with follow-up instructions.     The following adjunctive treatments were recommended: Fiber supplement Nitroglycerin as needed  The patient will return in 2-4 weeks for  follow-up and possible additional banding as required. No complications were encountered and the patient tolerated the procedure well.  Arnold Cellar, MD [facility]

## 2018-07-29 ENCOUNTER — Ambulatory Visit (INDEPENDENT_AMBULATORY_CARE_PROVIDER_SITE_OTHER): Payer: Medicare Other | Admitting: Gastroenterology

## 2018-07-29 ENCOUNTER — Encounter: Payer: Self-pay | Admitting: Gastroenterology

## 2018-07-29 VITALS — BP 140/80 | HR 80 | Wt 212.0 lb

## 2018-07-29 DIAGNOSIS — K642 Third degree hemorrhoids: Secondary | ICD-10-CM | POA: Diagnosis not present

## 2018-07-29 NOTE — Patient Instructions (Addendum)
Normal BMI (Body Mass Index- based on height and weight) is between 23 and 30. Your BMI today is Body mass index is 30.42 kg/m. Marland Kitchen Please consider follow up  regarding your BMI with your Primary Care Provider.   HEMORRHOID BANDING PROCEDURE    FOLLOW-UP CARE   1. The procedure you have had should have been relatively painless since the banding of the area involved does not have nerve endings and there is no pain sensation.  The rubber band cuts off the blood supply to the hemorrhoid and the band may fall off as soon as 48 hours after the banding (the band may occasionally be seen in the toilet bowl following a bowel movement). You may notice a temporary feeling of fullness in the rectum which should respond adequately to plain Tylenol or Motrin.  2. Following the banding, avoid strenuous exercise that evening and resume full activity the next day.  A sitz bath (soaking in a warm tub) or bidet is soothing, and can be useful for cleansing the area after bowel movements.     3. To avoid constipation, take two tablespoons of natural wheat bran, natural oat bran, flax, Benefiber or any over the counter fiber supplement and increase your water intake to 7-8 glasses daily.    4. Unless you have been prescribed anorectal medication, do not put anything inside your rectum for two weeks: No suppositories, enemas, fingers, etc.  5. Occasionally, you may have more bleeding than usual after the banding procedure.  This is often from the untreated hemorrhoids rather than the treated one.  Don't be concerned if there is a tablespoon or so of blood.  If there is more blood than this, lie flat with your bottom higher than your head and apply an ice pack to the area. If the bleeding does not stop within a half an hour or if you feel faint, call our office at [phone] or go to the emergency room.  6. Problems are not common; however, if there is a substantial amount of bleeding, severe pain, chills, fever  or difficulty passing urine (very rare) or other problems, you should call us at [phone] or report to the nearest emergency room.  7. Do not stay seated continuously for more than 2-3 hours for a day or two after the procedure.  Tighten your buttock muscles 10-15 times every two hours and take 10-15 deep breaths every 1-2 hours.  Do not spend more than a few minutes on the toilet if you cannot empty your bowel; instead re-visit the toilet at a later time.    Thank you for entrusting me with your care and for choosing [facility], Dr. [place] Cellar

## 2018-07-29 NOTE — Progress Notes (Signed)
68 y/o male here for follow up. He has had bleeding symptoms from hemorrhoids but also had pain and anal fissure noted. He was treated with topical nitroglycerin in recent weeks prior to the banding. He has grade III hemorrhoids, LL banded on 06/24/18. RP banded on 1/17. He thinks it has helped quite a bit since I've seen him, overall doing much btt. No bleeding, prolapse, or problems. Doing well overall and he tolerated it, he wishes to proceed with final banding today.   PROCEDURE NOTE: The patient presents with symptomatic grade III  hemorrhoids, requesting rubber band ligation of his/her hemorrhoidal disease.  All risks, benefits and alternative forms of therapy were described and informed consent was obtained.   The anorectum was pre-medicated with 0.125% nitroglycerin The decision was made to band the RA internal hemorrhoid, and the [place] was used to perform band ligation without complication.  Digital anorectal examination was then performed to assure proper positioning of the band, and to adjust the banded tissue as required. The patient was discharged home without pain or other issues.  Dietary and behavioral recommendations were given and along with follow-up instructions.      The patient will return as needed for follow-up  No complications were encountered and the patient tolerated the procedure well.  [place] Cellar, MD [facility]

## 2018-10-10 ENCOUNTER — Encounter (HOSPITAL_BASED_OUTPATIENT_CLINIC_OR_DEPARTMENT_OTHER): Payer: Self-pay | Admitting: Emergency Medicine

## 2018-10-10 ENCOUNTER — Ambulatory Visit: Payer: Self-pay | Admitting: *Deleted

## 2018-10-10 ENCOUNTER — Emergency Department (HOSPITAL_BASED_OUTPATIENT_CLINIC_OR_DEPARTMENT_OTHER): Payer: Medicare Other

## 2018-10-10 ENCOUNTER — Ambulatory Visit (INDEPENDENT_AMBULATORY_CARE_PROVIDER_SITE_OTHER): Payer: Medicare Other | Admitting: Family Medicine

## 2018-10-10 ENCOUNTER — Other Ambulatory Visit: Payer: Self-pay

## 2018-10-10 ENCOUNTER — Emergency Department (HOSPITAL_BASED_OUTPATIENT_CLINIC_OR_DEPARTMENT_OTHER)
Admission: EM | Admit: 2018-10-10 | Discharge: 2018-10-10 | Disposition: A | Discharge status: Home / Self Care | Payer: Medicare Other | Attending: Emergency Medicine | Admitting: Emergency Medicine

## 2018-10-10 ENCOUNTER — Encounter: Payer: Self-pay | Admitting: Family Medicine

## 2018-10-10 VITALS — Temp 101.0°F

## 2018-10-10 DIAGNOSIS — K529 Noninfective gastroenteritis and colitis, unspecified: Secondary | ICD-10-CM

## 2018-10-10 DIAGNOSIS — I2510 Atherosclerotic heart disease of native coronary artery without angina pectoris: Secondary | ICD-10-CM | POA: Diagnosis not present

## 2018-10-10 DIAGNOSIS — Z79899 Other long term (current) drug therapy: Secondary | ICD-10-CM | POA: Diagnosis not present

## 2018-10-10 DIAGNOSIS — R509 Fever, unspecified: Secondary | ICD-10-CM

## 2018-10-10 DIAGNOSIS — N189 Chronic kidney disease, unspecified: Secondary | ICD-10-CM | POA: Insufficient documentation

## 2018-10-10 DIAGNOSIS — R109 Unspecified abdominal pain: Secondary | ICD-10-CM

## 2018-10-10 DIAGNOSIS — R197 Diarrhea, unspecified: Secondary | ICD-10-CM

## 2018-10-10 DIAGNOSIS — R110 Nausea: Secondary | ICD-10-CM | POA: Diagnosis not present

## 2018-10-10 DIAGNOSIS — R05 Cough: Secondary | ICD-10-CM | POA: Diagnosis not present

## 2018-10-10 DIAGNOSIS — Z20822 Contact with and (suspected) exposure to covid-19: Secondary | ICD-10-CM

## 2018-10-10 DIAGNOSIS — R6889 Other general symptoms and signs: Secondary | ICD-10-CM

## 2018-10-10 DIAGNOSIS — R42 Dizziness and giddiness: Secondary | ICD-10-CM | POA: Diagnosis not present

## 2018-10-10 LAB — COMPREHENSIVE METABOLIC PANEL WITH GFR
ALT: 18 U/L (ref 0–44)
AST: 19 U/L (ref 15–41)
Albumin: 3.7 g/dL (ref 3.5–5.0)
Alkaline Phosphatase: 79 U/L (ref 38–126)
Anion gap: 6 (ref 5–15)
BUN: 17 mg/dL (ref 8–23)
CO2: 23 mmol/L (ref 22–32)
Calcium: 8.4 mg/dL — ABNORMAL LOW (ref 8.9–10.3)
Chloride: 105 mmol/L (ref 98–111)
Creatinine, Ser: 1.4 mg/dL — ABNORMAL HIGH (ref 0.61–1.24)
GFR calc Af Amer: 60 mL/min — ABNORMAL LOW
GFR calc non Af Amer: 52 mL/min — ABNORMAL LOW
Glucose, Bld: 118 mg/dL — ABNORMAL HIGH (ref 70–99)
Potassium: 4 mmol/L (ref 3.5–5.1)
Sodium: 134 mmol/L — ABNORMAL LOW (ref 135–145)
Total Bilirubin: 1.7 mg/dL — ABNORMAL HIGH (ref 0.3–1.2)
Total Protein: 6.6 g/dL (ref 6.5–8.1)

## 2018-10-10 LAB — CBC WITH DIFFERENTIAL/PLATELET
Abs Immature Granulocytes: 0.05 K/uL (ref 0.00–0.07)
Basophils Absolute: 0 K/uL (ref 0.0–0.1)
Basophils Relative: 0 %
Eosinophils Absolute: 0 K/uL (ref 0.0–0.5)
Eosinophils Relative: 0 %
HCT: 41.9 % (ref 39.0–52.0)
Hemoglobin: 12.9 g/dL — ABNORMAL LOW (ref 13.0–17.0)
Immature Granulocytes: 0 %
Lymphocytes Relative: 6 %
Lymphs Abs: 0.7 K/uL (ref 0.7–4.0)
MCH: 24.8 pg — ABNORMAL LOW (ref 26.0–34.0)
MCHC: 30.8 g/dL (ref 30.0–36.0)
MCV: 80.4 fL (ref 80.0–100.0)
Monocytes Absolute: 1 K/uL (ref 0.1–1.0)
Monocytes Relative: 9 %
Neutro Abs: 9.6 K/uL — ABNORMAL HIGH (ref 1.7–7.7)
Neutrophils Relative %: 85 %
Platelets: 170 K/uL (ref 150–400)
RBC: 5.21 MIL/uL (ref 4.22–5.81)
RDW: 13.6 % (ref 11.5–15.5)
WBC: 11.4 K/uL — ABNORMAL HIGH (ref 4.0–10.5)
nRBC: 0 % (ref 0.0–0.2)

## 2018-10-10 LAB — URINALYSIS, ROUTINE W REFLEX MICROSCOPIC
Glucose, UA: NEGATIVE mg/dL
Hgb urine dipstick: NEGATIVE
Ketones, ur: NEGATIVE mg/dL
Leukocytes,Ua: NEGATIVE
Nitrite: NEGATIVE
Protein, ur: NEGATIVE mg/dL
Specific Gravity, Urine: 1.02 (ref 1.005–1.030)
pH: 6 (ref 5.0–8.0)

## 2018-10-10 LAB — CBG MONITORING, ED: Glucose-Capillary: 117 mg/dL — ABNORMAL HIGH (ref 70–99)

## 2018-10-10 LAB — LACTIC ACID, PLASMA: Lactic Acid, Venous: 1.1 mmol/L (ref 0.5–1.9)

## 2018-10-10 MED ORDER — HYDROMORPHONE HCL 1 MG/ML IJ SOLN
1.0000 mg | Freq: Once | INTRAMUSCULAR | Status: AC
  Administered 2018-10-10: 13:00:00 1 mg via INTRAVENOUS
  Filled 2018-10-10: qty 1

## 2018-10-10 MED ORDER — ONDANSETRON HCL 4 MG/2ML IJ SOLN
4.0000 mg | Freq: Once | INTRAMUSCULAR | Status: AC
  Administered 2018-10-10: 12:00:00 4 mg via INTRAVENOUS

## 2018-10-10 MED ORDER — SODIUM CHLORIDE 0.9 % IV BOLUS
1000.0000 mL | Freq: Once | INTRAVENOUS | Status: AC
  Administered 2018-10-10: 1000 mL via INTRAVENOUS

## 2018-10-10 MED ORDER — HYDROCODONE-ACETAMINOPHEN 5-325 MG PO TABS: 1.0000 | ORAL_TABLET | Freq: Four times a day (QID) | ORAL | 0 refills | Status: DC | PRN

## 2018-10-10 MED ORDER — ONDANSETRON HCL 4 MG/2ML IJ SOLN
INTRAMUSCULAR | Status: AC
  Administered 2018-10-10: 4 mg via INTRAVENOUS
  Filled 2018-10-10: qty 2

## 2018-10-10 MED ORDER — ACETAMINOPHEN 325 MG PO TABS
ORAL_TABLET | ORAL | Status: AC
  Administered 2018-10-10: 650 mg
  Filled 2018-10-10: qty 2

## 2018-10-10 MED ORDER — SODIUM CHLORIDE 0.9 % IV SOLN
INTRAVENOUS | Status: DC
  Administered 2018-10-10: 1000 mL via INTRAVENOUS

## 2018-10-10 MED ORDER — LOPERAMIDE HCL 2 MG PO TABS: 2.0000 mg | ORAL_TABLET | Freq: Four times a day (QID) | ORAL | 0 refills | Status: DC | PRN

## 2018-10-10 MED ORDER — IOHEXOL 300 MG/ML  SOLN
100.0000 mL | Freq: Once | INTRAMUSCULAR | Status: AC | PRN
  Administered 2018-10-10: 14:00:00 100 mL via INTRAVENOUS

## 2018-10-10 MED FILL — HYDROCODON-APAP 5-325: 5-325 | 2 days supply | Qty: 10 | Fill #0

## 2018-10-10 MED FILL — ANTI-DIARRHEAL 2 MG TABS: 2 | 6 days supply | Qty: 24 | Fill #0

## 2018-10-10 NOTE — ED Triage Notes (Addendum)
Reports fever since last night.  Additionally c/o right flank pain and urinary frequency.  C/o nausea without vomiting but multiple episodes of diarrhea since last night.  Taking tylenol and ibuprofen but states fever persists.  Denies cough, shortness of breath.

## 2018-10-10 NOTE — ED Notes (Signed)
Pt more alert,  Able to stand and walk  md notified   Will dc

## 2018-10-10 NOTE — ED Provider Notes (Addendum)
[facility] EMERGENCY DEPARTMENT Provider Note   CSN: [id number] Arrival date & time: 10/10/18  1045    History   Chief Complaint Chief Complaint  Patient presents with  . Fever    HPI Anthony King is a 68 y.o. male.     Patient with acute onset of fever body aches nausea and diarrhea starting last evening.  Prior to that felt fine but has had a bit of a dry cough however thinks is probably secondary to pollen.  No known COVID-19 exposure however patient's son is a Airline pilot.  Patient had a tele-evaluation by primary care earlier today they noted that his fever was 101.5.  Patient referred in for further evaluation.  Patient was under the impression he was referred in to be tested for COVID-19.  Patient denies any shortness of breath.  Patient does talk about some right CVA may be some questionable right flank pain has had a history of kidney stones in the past but the constellation of symptoms does not necessarily fit that completely.     Past Medical History:  Diagnosis Date  . Arthritis 07-17-11   hands, hips, knees  . Basal cell carcinoma    nose  . Chronic kidney disease 07-17-11   past kidney stone x1  . Colon polyps    ADENOMATOUS AND HYPERPLASTIC POLYPS  . Diverticulosis   . Gastric ulcer   . GERD (gastroesophageal reflux disease)   . Hemorrhoids   . Hyperlipidemia   . Wears glasses     Patient Active Problem List   Diagnosis Date Noted  . Left lower quadrant abdominal pain 05/31/2018  . CRF (chronic renal failure), stage 3 (moderate) ([place]) 04/18/2018  . CAD (coronary artery disease), native coronary artery 03/19/2018  . Aortic atherosclerosis ([place]) 03/19/2018  . Prediabetes 03/19/2018  . Unstable angina ([place]) 03/19/2018  . Chest pain 03/18/2018  . CKD (chronic kidney disease), stage III ([place]) 03/18/2018  . Hyperlipidemia 10/04/2017  . Left flank mass, 3 cm subfascial 08/21/2013  . Mass of chest wall, right, 1 cm, subcutaneous.  08/21/2013  . Esophageal reflux 10/28/2011  . Personal history of colonic polyps 10/28/2011    Past Surgical History:  Procedure Laterality Date  . APPENDECTOMY    . BASAL CELL CARCINOMA EXCISION     nose  . COLONOSCOPY    . fatty tumors  07-17-11   excised x4- 1 -neck, 1 arm, 2 chest  . HEMORRHOID BANDING    . INGUINAL HERNIA REPAIR  07/23/2011   Procedure: LAPAROSCOPIC INGUINAL HERNIA;  Surgeon: Stark Klein, MD;  Location: [facility];  Service: General;  Laterality: Left;  . LEFT HEART CATH AND CORONARY ANGIOGRAPHY N/A 03/21/2018   Procedure: LEFT HEART CATH AND CORONARY ANGIOGRAPHY;  Surgeon: Jettie Booze, MD;  Location: [facility];  Service: Cardiovascular;  Laterality: N/A;  . MASS EXCISION N/A 08/29/2013   Procedure: EXCISION LEFT FLANK MASS/RIGHT EXCISION CHEST WALL MASS;  Surgeon: Stark Klein, MD;  Location: [facility];  Service: General;  Laterality: N/A;  Left flank        Home Medications    Prior to Admission medications   Medication Sig Start Date End Date Taking? Authorizing Provider  lansoprazole (PREVACID) 30 MG capsule Take 30 mg by mouth daily at 12 noon.    [provider]  Multiple Vitamin (MULTIVITAMIN WITH MINERALS) TABS tablet Take 1 tablet by mouth daily.    [provider]  vitamin B-12 (CYANOCOBALAMIN) 1000 MCG tablet  Take 1,000 mcg by mouth daily.     [provider]    Family History Family History  Problem Relation Age of Onset  . Stomach cancer Father   . Stomach cancer Sister        Passed away Jun 29, 2023  . Colon cancer Sister        mets from stomach  . Arthritis Mother   . Breast cancer Mother   . Arthritis Paternal Grandmother   . Arthritis Maternal Grandmother   . Esophageal cancer Neg Hx     Social History Social History   Tobacco Use  . Smoking status: Former Smoker    Last attempt to quit: 07/02/1991    Years since quitting: 27.2  . Smokeless tobacco: Never Used  Substance Use  Topics  . Alcohol use: No  . Drug use: No     Allergies   Aspirin   Review of Systems Review of Systems  Constitutional: Positive for fever. Negative for chills.  HENT: Negative for congestion, rhinorrhea and sore throat.   Eyes: Negative for visual disturbance.  Respiratory: Positive for cough. Negative for shortness of breath.   Cardiovascular: Negative for chest pain and leg swelling.  Gastrointestinal: Positive for diarrhea and nausea. Negative for abdominal pain and vomiting.  Genitourinary: Positive for flank pain. Negative for dysuria.  Musculoskeletal: Positive for back pain and myalgias. Negative for neck pain.  Skin: Negative for rash.  Neurological: Negative for dizziness, light-headedness and headaches.  Hematological: Does not bruise/bleed easily.  Psychiatric/Behavioral: Negative for confusion.     Physical Exam Updated Vital Signs BP 109/66 (BP Location: Right Arm)   Pulse 92   Temp (!) 100.5 F (38.1 C) (Oral)   Resp 20   Ht 1.803 m (5\' 11" )   Wt 102.1 kg   SpO2 100%   BMI 31.38 kg/m   Physical Exam Vitals signs and nursing note reviewed.  Constitutional:      General: He is not in acute distress.    Appearance: Normal appearance. He is well-developed. He is not ill-appearing.  HENT:     Head: Normocephalic and atraumatic.     Nose: No congestion.  Eyes:     Conjunctiva/sclera: Conjunctivae normal.  Neck:     Musculoskeletal: Normal range of motion and neck supple.  Cardiovascular:     Rate and Rhythm: Normal rate and regular rhythm.     Heart sounds: No murmur.  Pulmonary:     Effort: Pulmonary effort is normal. No respiratory distress.     Breath sounds: Normal breath sounds.  Abdominal:     General: Bowel sounds are normal.     Palpations: Abdomen is soft.     Tenderness: There is no abdominal tenderness.  Musculoskeletal: Normal range of motion.        General: No swelling.  Skin:    General: Skin is warm and dry.  Neurological:      General: No focal deficit present.     Mental Status: He is alert and oriented to person, place, and time.      ED Treatments / Results  Labs (all labs ordered are listed, but only abnormal results are displayed) Labs Reviewed  CBC WITH DIFFERENTIAL/PLATELET - Abnormal; Notable for the following components:      Result Value   WBC 11.4 (*)    Hemoglobin 12.9 (*)    MCH 24.8 (*)    Neutro Abs 9.6 (*)    All other components within normal limits  CULTURE, BLOOD (  ROUTINE X 2)  CULTURE, BLOOD (ROUTINE X 2)  LACTIC ACID, PLASMA  LACTIC ACID, PLASMA  COMPREHENSIVE METABOLIC PANEL  URINALYSIS, ROUTINE W REFLEX MICROSCOPIC    EKG None  Radiology No results found.  Procedures Procedures (including critical care time)  Medications Ordered in ED Medications  0.9 %  sodium chloride infusion (has no administration in time range)  ondansetron (ZOFRAN) injection 4 mg (has no administration in time range)  sodium chloride 0.9 % bolus 1,000 mL (1,000 mLs Intravenous New Bag/Given 10/10/18 1210)  ondansetron (ZOFRAN) 4 MG/2ML injection (4 mg  Given 10/10/18 1159)     Initial Impression / Assessment and Plan / ED Course  I have reviewed the triage vital signs and the nursing notes.  Pertinent labs & imaging results that were available during my care of the patient were reviewed by me and considered in my medical decision making (see chart for details).       Patient nontoxic no acute distress.  Patient symptoms very suspicious for COVID-19 infection.  Patient's abdomen is soft and nontender.  Patient not tachycardic not hypoxic not tachypneic.  Patient is febrile.  Patient's initial lab work-up showed a leukocytosis chest x-ray that was negative for any pneumonia findings.  Patient with the complaint of flank abdominal pain urinalysis negative bilirubin slightly elevated.  Patient tells me he has a history of gallstones.  And has had a history of kidney stones in the past.  Urine  without any hematuria.  Based on this in particular with a complaint of diarrhea more concerned may be for diverticulitis or a colitis so we will go and do a CT scan of the abdomen.  Clinically overall symptom complex very suspicious for COVID-19.     Final Clinical Impressions(s) / ED Diagnoses   Final diagnoses:  Fever, unspecified fever cause  Suspected Covid-19 Virus Infection    ED Discharge Orders    None       Fredia Sorrow, MD 10/10/18 1227    Fredia Sorrow, MD 10/10/18 1306

## 2018-10-10 NOTE — Telephone Encounter (Signed)
Patient had virtual visit today.

## 2018-10-10 NOTE — ED Provider Notes (Signed)
I was asked to see the patient after he was initially seen by Dr. Rogene Houston.  Patient presented here with abdominal pain, diarrhea, and fever for the past several days.  His work-up is consistent with an enteritis.  He was in the process of being discharged when the patient became confused and spiked a fever to 102.8.  I was asked to evaluate the patient after Dr. Rogene Houston had left for the day.  Patient does appear to be somewhat confused, however is sitting upright and fully dressed in the exam room.  He is slow to respond to questions but appears steady and well coordinated.  Patient was reexamined and I see nothing focal.  Patient was given Tylenol.  Temperature was rechecked and was 102 and he is more lucid and is requesting to go home.  I discussed the situation with the wife who was in the parking lot waiting in the car due to visitor restrictions due to the recent pandemic.  She is comfortable with the disposition and patient will be discharged to home.  They are to rotate Tylenol and ibuprofen as needed and return if his symptoms worsen.  I suspect his confusion was related to the high fever as he is now much more oriented and appears less confused.     Veryl Speak, MD 10/10/18 1640

## 2018-10-10 NOTE — ED Notes (Signed)
Went to dc pt  Rechecked temp  Ws 102.8,  Pt unable to sit up on foot of bed, weak,  acting confused, was able to answer most question,  Was given tylenol,  Md notified  Will re evaluate in about 30-40 minutes

## 2018-10-10 NOTE — ED Notes (Signed)
C/o rt flank, rt upper arm, left lower leg, bilateral elbows, and ankle pain  That comes and goes,  Low grade fever, diarrhea,  nausea  Onset last pm

## 2018-10-10 NOTE — Discharge Instructions (Addendum)
CT scan shows some inflammation of the last part of the small intestines in the first part of your large intestines.  This is consistent with an enteritis colitis.  Still clinically concerned about the possible of COVID-19 infection so recommend self isolating for the next 2 weeks.  Take the hydrocodone as needed for pain take Imodium as directed for the diarrhea.  Recommend Tylenol for the fever.  But keep in mind that the hydrocodone has Tylenol in it.  So do not take Tylenol with the hydrocodone.  Follow-up with your doctor as needed.  Return for any new or worse symptoms.  Community areas that are doing COVID-19 testing information provided in your discharge packet.

## 2018-10-10 NOTE — Telephone Encounter (Signed)
Pt called with complaints of fever, chills, and diarrhea which stated 10/09/2018; his temp was 101.0 with mercury thermometer; the pt says that he had 8 episodes of diarrhea on 10/09/2018, and general body aches;  He says that he is having right back pain and thinks he has a kidney stone because of back pain; recommendations made per nurse triage protocol; pt agrees to virtual visit with Dr Etter Sjogren, [facility]; he can be contacted at [email or web address]; he can also be contacted at [phone]; conference call initiated with  [facility]; per [facility] pt offered and accepted appointment with Dr Etter Sjogren, [place], 10/10/2018 at 0915; he verbalized understanding; will route to office for notification.      Reason for Disposition . [1] SEVERE back pain (e.g., excruciating, unable to do any normal activities) AND [2] not improved 2 hours after pain medicine  Answer Assessment - Initial Assessment Questions 1. ONSET: "When did the pain begin?"      1 weekago 2. LOCATION: "Where does it hurt?" (upper, mid or lower back)    Right lower back 3. SEVERITY: "How bad is the pain?"  (e.g., Scale 1-10; mild, moderate, or severe)   - MILD (1-3): doesn't interfere with normal activities    - MODERATE (4-7): interferes with normal activities or awakens from sleep    - SEVERE (8-10): excruciating pain, unable to do any normal activities     7 out of 10 4. PATTERN: "Is the pain constant?" (e.g., yes, no; constant, intermittent)      intermittent 5. RADIATION: "Does the pain shoot into your legs or elsewhere?"     Lower abdomen  6. CAUSE:  "What do you think is causing the back pain?"      Kidney stone 7. BACK OVERUSE:  "Any recent lifting of heavy objects, strenuous work or exercise?"     no 8. MEDICATIONS: "What have you taken so far for the pain?" (e.g., nothing, acetaminophen, NSAIDS)     Acetaminophen 3 times and ibuporfen 1 time 9. NEUROLOGIC SYMPTOMS: "Do you have any weakness, numbness, or problems with  bowel/bladder control?"   no 10. OTHER SYMPTOMS: "Do you have any other symptoms?" (e.g., fever, abdominal pain, burning with urination, blood in urine)       Frequent urine, fever 11. PREGNANCY: "Is there any chance you are pregnant?" (e.g., yes, no; LMP)      n/a  Protocols used: BACK PAIN-A-AH

## 2018-10-10 NOTE — Progress Notes (Signed)
Virtual Visit via Video Note  I connected with Anthony King on 10/10/18 at  9:15 AM EDT by a video enabled telemedicine application and verified that I am speaking with the correct person using two identifiers.    I discussed the limitations of evaluation and management by telemedicine and the availability of in person appointments. The patient expressed understanding and agreed to proceed.  History of Present Illness: Pt home c/o fever 101 , severe R flank pain and diarrhea since yesterday.  He also c/o severe dizziness.  No sob. No cp  He has taken tylenol and ibuprofen several times and fever has not come down and pain has not subsided.  Pt with hx kidney stones   Pt also c/o severe body aches all over .   He is concerned about covid but also about the possibility of a kidney stone .  Pt states he'd rather die than go to er but the pain is so severe he is asking for morphine      Observations/Objective: Fever 101  Pt obviously uncomfortable , labored breathing   Assessment and Plan: 1. Fever, abd pain---  Advised pt to go to ER to be evaluated.  Pt has gloves and mask to wear  He agreed to go to ER   Follow Up Instructions:    I discussed the assessment and treatment plan with the patient. The patient was provided an opportunity to ask questions and all were answered. The patient agreed with the plan and demonstrated an understanding of the instructions.   The patient was advised to call back or seek an in-person evaluation if the symptoms worsen or if the condition fails to improve as anticipated.     Ann Held, DO

## 2018-10-11 ENCOUNTER — Ambulatory Visit: Payer: Self-pay

## 2018-10-11 NOTE — Telephone Encounter (Signed)
Pt and his wife called.. she just had questions about the patient ER visit yesterday. She states that she stayed in the car and the ER doctor called her with instructions.  She wanted to verify her understanding of enteritis. Pt and wife told to monitor  for worsening of symptoms and call or go to ER if necessary. Wife and patient told that high fever is suspicious for the COVID-19. Answer Assessment - Initial Assessment Questions 1. REASON FOR CALL or QUESTION: "What is your reason for calling today?" or "How can I best help you?" or "What question do you have that I can help answer?"     Er instruction questions  Protocols used: INFORMATION ONLY CALL-A-AH

## 2018-10-12 ENCOUNTER — Other Ambulatory Visit: Payer: Self-pay

## 2018-10-12 ENCOUNTER — Telehealth: Payer: Self-pay | Admitting: Family Medicine

## 2018-10-12 ENCOUNTER — Encounter: Payer: Self-pay | Admitting: Family Medicine

## 2018-10-12 ENCOUNTER — Ambulatory Visit (INDEPENDENT_AMBULATORY_CARE_PROVIDER_SITE_OTHER): Payer: Medicare Other | Admitting: Family Medicine

## 2018-10-12 DIAGNOSIS — R197 Diarrhea, unspecified: Secondary | ICD-10-CM

## 2018-10-12 NOTE — Progress Notes (Signed)
Virtual Visit via Video Note  I connected with Anthony King on 10/12/18 at  2:30 PM EDT by a video enabled telemedicine application and verified that I am speaking with the correct person using two identifiers.   I discussed the limitations of evaluation and management by telemedicine and the availability of in person appointments. The patient expressed understanding and agreed to proceed.  History of Present Illness: Pt is home after er visit --- pt is feeling a little better.  He is still having diarrhea q1 hours and has abd cramping.  Er sent him home with immodium and pain meds for flank pain.  Fever finally broke today No vomiting  Pt is eating well   pt audio not working well after starting web visit we has to turn it over to a tele visit  Observations/Objective: Afebrile--- pt not sob -- pt talking much easier  No cp Still with some side pain on the right but it is better.  He has not needed the pain med.    Assessment and Plan: 1. Diarrhea, unspecified type---  prob viral GE  Er visit and ct / xray reviewed  pedialyte if needed immodium per er  Pain meds if needed Call or rto or er if symptoms worsen  - Fecal occult blood, imunochemical; Future - C. difficile GDH and Toxin A/B; Future - Stool Culture; Future   Follow Up Instructions:    I discussed the assessment and treatment plan with the patient. The patient was provided an opportunity to ask questions and all were answered. The patient agreed with the plan and demonstrated an understanding of the instructions.   The patient was advised to call back or seek an in-person evaluation if the symptoms worsen or if the condition fails to improve as anticipated.  I provided 25 minutes of non-face-to-face time during this encounter.   Ann Held, DO

## 2018-10-12 NOTE — Telephone Encounter (Signed)
Copied from [place] [id number]. Topic: Quick Communication - See Telephone Encounter >> Oct 12, 2018 10:06 AM Burchel, Abbi R wrote: CRM for notification. See Telephone encounter for: 10/12/18.  Pt requesting a call back from Dr Carollee Herter re: continued abd pain.   [phone]

## 2018-10-12 NOTE — Telephone Encounter (Signed)
Appointment made for today for follow up

## 2018-10-13 ENCOUNTER — Other Ambulatory Visit: Payer: Medicare Other

## 2018-10-13 ENCOUNTER — Ambulatory Visit: Payer: Self-pay | Admitting: *Deleted

## 2018-10-13 DIAGNOSIS — R197 Diarrhea, unspecified: Secondary | ICD-10-CM

## 2018-10-13 NOTE — Telephone Encounter (Signed)
Spoke with pt and advised him stool tests will not check for Covid-19. We are looking for other organisms that would cause the diarrhea. Also reviewed with pt that cxr from ER visit was normal, CT from ER visit indicates enteritis and OV with PCP indicated possible viral gastroenteritis. Pt voices understanding.

## 2018-10-13 NOTE — Telephone Encounter (Signed)
As discussed during ov with wife--- only er and hosp are testing for covid 19 at this time

## 2018-10-13 NOTE — Telephone Encounter (Signed)
Received TC from patient's son reporting his father was asked to bring in a stool sample this morning. asking if the stool can be tested for Covid-19. Informed him not that I'm aware of but I would pass the question on to Dr. Carollee Herter. Routing to PCP.  Reason for Disposition . Requesting regular office appointment  Answer Assessment - Initial Assessment Questions 1. REASON FOR CALL or QUESTION: "What is your reason for calling today?" or "How can I best help you?" or "What question do you have that I can help answer?"    Can stool be tested for covid-19?  Protocols used: INFORMATION ONLY CALL-A-AH

## 2018-10-14 ENCOUNTER — Telehealth: Payer: Self-pay | Admitting: Gastroenterology

## 2018-10-14 LAB — C. DIFFICILE GDH AND TOXIN A/B
GDH ANTIGEN: NOT DETECTED
MICRO NUMBER:: 400542
SPECIMEN QUALITY:: ADEQUATE
TOXIN A AND B: NOT DETECTED

## 2018-10-14 NOTE — Telephone Encounter (Signed)
Pt scheduled to have virtual visit with Dr. Havery Moros 10/18/18@8 :30am. Pt aware of appt.

## 2018-10-15 LAB — CULTURE, BLOOD (ROUTINE X 2)
Culture: NO GROWTH
Culture: NO GROWTH
Special Requests: ADEQUATE
Special Requests: ADEQUATE

## 2018-10-16 LAB — STOOL CULTURE
MICRO NUMBER:: 400362
MICRO NUMBER:: 400363
MICRO NUMBER:: 400364
SHIGA RESULT:: NOT DETECTED
SPECIMEN QUALITY:: ADEQUATE
SPECIMEN QUALITY:: ADEQUATE
SPECIMEN QUALITY:: ADEQUATE

## 2018-10-17 ENCOUNTER — Other Ambulatory Visit: Payer: Self-pay | Admitting: Family Medicine

## 2018-10-17 DIAGNOSIS — A045 Campylobacter enteritis: Secondary | ICD-10-CM

## 2018-10-17 MED ORDER — AZITHROMYCIN 250 MG PO TABS: ORAL_TABLET | ORAL | 0 refills | Status: DC

## 2018-10-20 ENCOUNTER — Telehealth: Payer: Self-pay | Admitting: Gastroenterology

## 2018-10-20 NOTE — Telephone Encounter (Signed)
Patient called said he is having a situation down there and did not want to give me more information. Would like for someone to cal back.

## 2018-10-20 NOTE — Telephone Encounter (Signed)
Patient called and wanted to let you know he has another hemorriod that has come out. He was able to put it back in. Also the patient did go into the ER on 4/13 for a GI issue.Was diagnosed as Camplobactor diarrhea and his PCP put him on a Z-pack. He is recovering from that

## 2018-10-20 NOTE — Telephone Encounter (Signed)
Sorry to hear this, suspect the hemorrhoids may have flared in the setting of diarrhea. He can use some topical 1% hydrocortisone cream PR if there is irritation, otherwise you can make a virtual visit to discuss it, I should have openings next week. Thanks

## 2018-10-21 NOTE — Telephone Encounter (Signed)
Patient notified.  He will call back for continued pain or issues.  He is asked to also try Recticare as needed.

## 2018-11-07 ENCOUNTER — Other Ambulatory Visit: Payer: Self-pay | Admitting: Family Medicine

## 2018-11-07 ENCOUNTER — Other Ambulatory Visit (INDEPENDENT_AMBULATORY_CARE_PROVIDER_SITE_OTHER): Payer: Medicare Other

## 2018-11-07 DIAGNOSIS — A09 Infectious gastroenteritis and colitis, unspecified: Secondary | ICD-10-CM

## 2018-11-07 DIAGNOSIS — R197 Diarrhea, unspecified: Secondary | ICD-10-CM | POA: Diagnosis not present

## 2018-11-07 DIAGNOSIS — R195 Other fecal abnormalities: Secondary | ICD-10-CM

## 2018-11-07 LAB — FECAL OCCULT BLOOD, IMMUNOCHEMICAL: Fecal Occult Bld: POSITIVE — AB

## 2019-01-25 ENCOUNTER — Other Ambulatory Visit: Payer: Self-pay

## 2019-03-24 ENCOUNTER — Ambulatory Visit: Payer: Self-pay | Admitting: Surgery

## 2019-03-24 DIAGNOSIS — K4091 Unilateral inguinal hernia, without obstruction or gangrene, recurrent: Secondary | ICD-10-CM | POA: Diagnosis not present

## 2019-03-24 DIAGNOSIS — K4090 Unilateral inguinal hernia, without obstruction or gangrene, not specified as recurrent: Secondary | ICD-10-CM | POA: Diagnosis not present

## 2019-03-24 NOTE — H&P (View-Only) (Signed)
Anthony King Documented: 03/24/2019 11:43 AM Location: [facility] Patient #: [id number] DOB: 09-22-1950 Married / Language: [name] / Race: White Male  History of Present Illness Marcello Moores A. Caellum Mancil MD; 03/24/2019 1:04 PM) Patient words: Patient presents for evaluation of bilateral inguinal hernia. Patient complains of bilateral groin swelling and pain. He underwent a laparoscopic repair of his right more hernia back in 2013 with mesh. Over the last month he's had more pain especially in his left side but now on his right side and burning especially when he lifts furniture. 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 This elicits discomfort and swelling in both groins. He had a computed tomography scan done in April. I reviewed this nerve appeared to be small inguinal hernias all fat which were not read by the radiologist.  The patient is a 68 year old male.   Past Surgical History Sallyanne Kuster, CMA; 03/24/2019 11:43 AM) Colon Polyp Removal - Colonoscopy Hemorrhoidectomy Shoulder Surgery Right. Ventral / Umbilical Hernia Surgery Right.  Diagnostic Studies History Sallyanne Kuster, [place]; 03/24/2019 11:43 AM) Colonoscopy 1-5 years ago  Allergies Sallyanne Kuster, [place]; 03/24/2019 11:44 AM) No Known Drug Allergies [03/24/2019]: Allergies Reconciled  Medication History Sallyanne Kuster, CMA; 03/24/2019 11:45 AM) Multivitamin (Oral) Active. Vitamin B-12 (1000MCG Tablet, 1 (one) Oral) Active. Medications Reconciled  Social History Sallyanne Kuster, [place]; 03/24/2019 11:43 AM) Caffeine use Carbonated beverages, Tea. No alcohol use No drug use Tobacco use Former smoker.  Family History Sallyanne Kuster, [place]; 03/24/2019 11:43 AM) Arthritis Mother. Breast Cancer Mother. Cancer Father, Mother, Sister. Colon Polyps Family Members In General.  Other Problems Sallyanne Kuster, [place]; 03/24/2019 11:43  AM) Arthritis Cholelithiasis Diverticulosis Gastroesophageal Reflux Disease Hemorrhoids Kidney Stone Umbilical Hernia Repair     Review of Systems Sallyanne Kuster CMA; 03/24/2019 11:43 AM) General Not Present- Appetite Loss, Chills, Fatigue, Fever, Night Sweats, Weight Gain and Weight Loss. Skin Not Present- Change in Wart/Mole, Dryness, Hives, Jaundice, New Lesions, Non-Healing Wounds, Rash and Ulcer. Cardiovascular Present- Swelling of Extremities. Not Present- Chest Pain, Difficulty Breathing Lying Down, Leg Cramps, Palpitations, Rapid Heart Rate and Shortness of Breath. Gastrointestinal Not Present- Abdominal Pain, Bloating, Bloody Stool, Change in Bowel Habits, Chronic diarrhea, Constipation, Difficulty Swallowing, Excessive gas, Gets full quickly at meals, Hemorrhoids, Indigestion, Nausea, Rectal Pain and Vomiting. Male Genitourinary Present- Change in Urinary Stream, Frequency and Urgency. Not Present- Blood in Urine, Impotence, Nocturia, Painful Urination and Urine Leakage.  Vitals Sallyanne Kuster CMA; 03/24/2019 11:46 AM) 03/24/2019 11:45 AM Weight: 213 lb Height: 70in Body Surface Area: 2.14 m Body Mass Index: 30.56 kg/m  Temp.: 98.67F  Pulse: 69 (Regular)  P.OX: 97% (Room air) BP: 140/85 (Sitting, Left Arm, Standard)        Physical Exam (Lakera Viall A. Darice Vicario MD; 03/24/2019 1:05 PM)  General Mental Status-Alert. General Appearance-Consistent with stated age. Hydration-Well hydrated. Voice-Normal.  Head and Neck Head-normocephalic, atraumatic with no lesions or palpable masses. Trachea-midline. Thyroid Gland Characteristics - normal size and consistency.  Eye Eyeball - Bilateral-Extraocular movements intact. Sclera/Conjunctiva - Bilateral-No scleral icterus.  Chest and Lung Exam Chest and lung exam reveals -quiet, even and easy respiratory effort with no use of accessory muscles and on auscultation, normal breath sounds, no  adventitious sounds and normal vocal resonance. Inspection Chest Wall - Normal. Back - normal.  Cardiovascular Cardiovascular examination reveals -normal heart sounds, regular rate and rhythm with no murmurs and normal pedal pulses bilaterally.  Abdomen Note: Small reducible bilateral inguinal hernia. Soft nontender. Laparoscopic  scars noted.  Neurologic Neurologic evaluation reveals -alert and oriented x 3 with no impairment of recent or remote memory. Mental Status-Normal.  Musculoskeletal Normal Exam - Left-Upper Extremity Strength Normal and Lower Extremity Strength Normal. Normal Exam - Right-Upper Extremity Strength Normal and Lower Extremity Strength Normal.    Assessment & Plan (Corrissa Martello A. Kenzley Ke MD; 03/24/2019 1:06 PM)  LEFT INGUINAL HERNIA (K40.90) Impression: Recommend open repair with mesh The risk of hernia repair include bleeding, infection, organ injury, bowel injury, bladder injury, nerve injury recurrent hernia, blood clots, worsening of underlying condition, chronic pain, mesh use, open surgery, death, and the need for other operations. Pt agrees to proceed   RECURRENT RIGHT INGUINAL HERNIA (K40.91) Impression: Recommend open repair with mesh The risk of hernia repair include bleeding, infection, organ injury, bowel injury, bladder injury, nerve injury recurrent hernia, blood clots, worsening of underlying condition, chronic pain, mesh use, open surgery, death, and the need for other operations. Pt agrees to proceed  Current Plans Pt Education - CCS Mesh education: discussed with patient and provided information. The anatomy & physiology of the abdominal wall and pelvic floor was discussed. The pathophysiology of hernias in the inguinal and pelvic region was discussed. Natural history risks such as progressive enlargement, pain, incarceration, and strangulation was discussed. Contributors to complications such as smoking, obesity, diabetes, prior  surgery, etc were discussed.  I feel the risks of no intervention will lead to serious problems that outweigh the operative risks; therefore, I recommended surgery to reduce and repair the hernia. I explained an open approach. I noted usual use of mesh to patch and/or buttress hernia repair  Risks such as bleeding, infection, abscess, need for further treatment, heart attack, death, and other risks were discussed. I noted a good likelihood this will help address the problem. Goals of post-operative recovery were discussed as well. Possibility that this will not correct all symptoms was explained. I stressed the importance of low-impact activity, aggressive pain control, avoiding constipation, & not pushing through pain to minimize risk of post-operative chronic pain or injury. Possibility of reherniation was discussed. We will work to minimize complications.  An educational handout further explaining the pathology & treatment options was given as well. Questions were answered. The patient expresses understanding & wishes to proceed with surgery.  Pt Education - CCS Mesh education: discussed with patient and provided information. You are being scheduled for surgery- Our schedulers will call you.  You should hear from our office's scheduling department within 5 working days about the location, date, and time of surgery. We try to make accommodations for patient's preferences in scheduling surgery, but sometimes the OR schedule or the surgeon's schedule prevents [place] from making those accommodations.  If you have not heard from our office [phone]) in 5 working days, call the office and ask for your surgeon's nurse.  If you have other questions about your diagnosis, plan, or surgery, call the office and ask for your surgeon's nurse.

## 2019-03-24 NOTE — H&P (Signed)
Anthony King Documented: 03/24/2019 11:43 AM Location: [facility] Patient #: [id number] DOB: 07-May-1951 Married / Language: Anthony King / Race: White Male  History of Present Illness Anthony King A. Anthony Manera MD; 03/24/2019 1:04 PM) Patient words: Patient presents for evaluation of bilateral inguinal hernia. Patient complains of bilateral groin swelling and pain. He underwent a laparoscopic repair of his right more hernia back in 2013 with mesh. Over the last month he's had more pain especially in his left side but now on his right side and burning especially when he lifts furniture. 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 This elicits discomfort and swelling in both groins. He had a computed tomography scan done in April. I reviewed this nerve appeared to be small inguinal hernias all fat which were not read by the radiologist.  The patient is a 68 year old male.   Past Surgical History Anthony King, CMA; 03/24/2019 11:43 AM) Colon Polyp Removal - Colonoscopy Hemorrhoidectomy Shoulder Surgery Right. Ventral / Umbilical Hernia Surgery Right.  Diagnostic Studies History Anthony King, [place]; 03/24/2019 11:43 AM) Colonoscopy 1-5 years ago  Allergies Anthony King, [place]; 03/24/2019 11:44 AM) No Known Drug Allergies [03/24/2019]: Allergies Reconciled  Medication History Anthony King, CMA; 03/24/2019 11:45 AM) Multivitamin (Oral) Active. Vitamin B-12 (1000MCG Tablet, 1 (one) Oral) Active. Medications Reconciled  Social History Anthony King, [place]; 03/24/2019 11:43 AM) Caffeine use Carbonated beverages, Tea. No alcohol use No drug use Tobacco use Former smoker.  Family History Anthony King, [place]; 03/24/2019 11:43 AM) Arthritis Mother. Breast Cancer Mother. Cancer Father, Mother, Sister. Colon Polyps Family Members In General.  Other Problems Anthony King, [place]; 03/24/2019 11:43  AM) Arthritis Cholelithiasis Diverticulosis Gastroesophageal Reflux Disease Hemorrhoids Kidney Stone Umbilical Hernia Repair     Review of Systems Anthony King CMA; 03/24/2019 11:43 AM) General Not Present- Appetite Loss, Chills, Fatigue, Fever, Night Sweats, Weight Gain and Weight Loss. Skin Not Present- Change in Wart/Mole, Dryness, Hives, Jaundice, New Lesions, Non-Healing Wounds, Rash and Ulcer. Cardiovascular Present- Swelling of Extremities. Not Present- Chest Pain, Difficulty Breathing Lying Down, Leg Cramps, Palpitations, Rapid Heart Rate and Shortness of Breath. Gastrointestinal Not Present- Abdominal Pain, Bloating, Bloody Stool, Change in Bowel Habits, Chronic diarrhea, Constipation, Difficulty Swallowing, Excessive gas, Gets full quickly at meals, Hemorrhoids, Indigestion, Nausea, Rectal Pain and Vomiting. Male Genitourinary Present- Change in Urinary Stream, Frequency and Urgency. Not Present- Blood in Urine, Impotence, Nocturia, Painful Urination and Urine Leakage.  Vitals Anthony King CMA; 03/24/2019 11:46 AM) 03/24/2019 11:45 AM Weight: 213 lb Height: 70in Body Surface Area: 2.14 m Body Mass Index: 30.56 kg/m  Temp.: 98.69F  Pulse: 69 (Regular)  P.OX: 97% (Room air) BP: 140/85 (Sitting, Left Arm, Standard)        Physical Exam (Anthony King A. Kiyana Vazguez MD; 03/24/2019 1:05 PM)  General Mental Status-Alert. General Appearance-Consistent with stated age. Hydration-Well hydrated. Voice-Normal.  Head and Neck Head-normocephalic, atraumatic with no lesions or palpable masses. Trachea-midline. Thyroid Gland Characteristics - normal size and consistency.  Eye Eyeball - Bilateral-Extraocular movements intact. Sclera/Conjunctiva - Bilateral-No scleral icterus.  Chest and Lung Exam Chest and lung exam reveals -quiet, even and easy respiratory effort with no use of accessory muscles and on auscultation, normal breath sounds, no  adventitious sounds and normal vocal resonance. Inspection Chest Wall - Normal. Back - normal.  Cardiovascular Cardiovascular examination reveals -normal heart sounds, regular rate and rhythm with no murmurs and normal pedal pulses bilaterally.  Abdomen Note: Small reducible bilateral inguinal hernia. Soft nontender. Laparoscopic  scars noted.  Neurologic Neurologic evaluation reveals -alert and oriented x 3 with no impairment of recent or remote memory. Mental Status-Normal.  Musculoskeletal Normal Exam - Left-Upper Extremity Strength Normal and Lower Extremity Strength Normal. Normal Exam - Right-Upper Extremity Strength Normal and Lower Extremity Strength Normal.    Assessment & Plan (Elizet Kaplan A. Kynslei Art MD; 03/24/2019 1:06 PM)  LEFT INGUINAL HERNIA (K40.90) Impression: Recommend open repair with mesh The risk of hernia repair include bleeding, infection, organ injury, bowel injury, bladder injury, nerve injury recurrent hernia, blood clots, worsening of underlying condition, chronic pain, mesh use, open surgery, death, and the need for other operations. Pt agrees to proceed   RECURRENT RIGHT INGUINAL HERNIA (K40.91) Impression: Recommend open repair with mesh The risk of hernia repair include bleeding, infection, organ injury, bowel injury, bladder injury, nerve injury recurrent hernia, blood clots, worsening of underlying condition, chronic pain, mesh use, open surgery, death, and the need for other operations. Pt agrees to proceed  Current Plans Pt Education - CCS Mesh education: discussed with patient and provided information. The anatomy & physiology of the abdominal wall and pelvic floor was discussed. The pathophysiology of hernias in the inguinal and pelvic region was discussed. Natural history risks such as progressive enlargement, pain, incarceration, and strangulation was discussed. Contributors to complications such as smoking, obesity, diabetes, prior  surgery, etc were discussed.  I feel the risks of no intervention will lead to serious problems that outweigh the operative risks; therefore, I recommended surgery to reduce and repair the hernia. I explained an open approach. I noted usual use of mesh to patch and/or buttress hernia repair  Risks such as bleeding, infection, abscess, need for further treatment, heart attack, death, and other risks were discussed. I noted a good likelihood this will help address the problem. Goals of post-operative recovery were discussed as well. Possibility that this will not correct all symptoms was explained. I stressed the importance of low-impact activity, aggressive pain control, avoiding constipation, & not pushing through pain to minimize risk of post-operative chronic pain or injury. Possibility of reherniation was discussed. We will work to minimize complications.  An educational handout further explaining the pathology & treatment options was given as well. Questions were answered. The patient expresses understanding & wishes to proceed with surgery.  Pt Education - CCS Mesh education: discussed with patient and provided information. You are being scheduled for surgery- Our schedulers will call you.  You should hear from our office's scheduling department within 5 working days about the location, date, and time of surgery. We try to make accommodations for patient's preferences in scheduling surgery, but sometimes the OR schedule or the surgeon's schedule prevents [place] from making those accommodations.  If you have not heard from our office [phone]) in 5 working days, call the office and ask for your surgeon's nurse.  If you have other questions about your diagnosis, plan, or surgery, call the office and ask for your surgeon's nurse.

## 2019-04-12 NOTE — Progress Notes (Signed)
Reviewed chart with Dr Kalman Shan (pt hasn't followed up with cardiologist since cath in 02-2018) and reviewed cath results with him. OK to continue with scheduled surgery. Pt will come for lab work, ensure, and hibiclens this week and then COVID test on Saturday

## 2019-04-13 ENCOUNTER — Other Ambulatory Visit: Payer: Self-pay

## 2019-04-13 ENCOUNTER — Encounter (HOSPITAL_BASED_OUTPATIENT_CLINIC_OR_DEPARTMENT_OTHER)
Admission: RE | Admit: 2019-04-13 | Discharge: 2019-04-13 | Disposition: A | Discharge status: Home / Self Care | Payer: Medicare Other | Source: Ambulatory Visit | Attending: Surgery | Admitting: Surgery

## 2019-04-13 DIAGNOSIS — K4020 Bilateral inguinal hernia, without obstruction or gangrene, not specified as recurrent: Secondary | ICD-10-CM | POA: Insufficient documentation

## 2019-04-13 DIAGNOSIS — Z01812 Encounter for preprocedural laboratory examination: Secondary | ICD-10-CM | POA: Diagnosis not present

## 2019-04-13 LAB — CBC WITH DIFFERENTIAL/PLATELET
Abs Immature Granulocytes: 0.02 10*3/uL (ref 0.00–0.07)
Basophils Absolute: 0 10*3/uL (ref 0.0–0.1)
Basophils Relative: 1 %
Eosinophils Absolute: 0.1 10*3/uL (ref 0.0–0.5)
Eosinophils Relative: 2 %
HCT: 45.4 % (ref 39.0–52.0)
Hemoglobin: 14.1 g/dL (ref 13.0–17.0)
Immature Granulocytes: 0 %
Lymphocytes Relative: 37 %
Lymphs Abs: 2.1 10*3/uL (ref 0.7–4.0)
MCH: 24 pg — ABNORMAL LOW (ref 26.0–34.0)
MCHC: 31.1 g/dL (ref 30.0–36.0)
MCV: 77.2 fL — ABNORMAL LOW (ref 80.0–100.0)
Monocytes Absolute: 0.6 10*3/uL (ref 0.1–1.0)
Monocytes Relative: 10 %
Neutro Abs: 2.9 10*3/uL (ref 1.7–7.7)
Neutrophils Relative %: 50 %
Platelets: 211 10*3/uL (ref 150–400)
RBC: 5.88 MIL/uL — ABNORMAL HIGH (ref 4.22–5.81)
RDW: 15.7 % — ABNORMAL HIGH (ref 11.5–15.5)
WBC: 5.8 10*3/uL (ref 4.0–10.5)
nRBC: 0 % (ref 0.0–0.2)

## 2019-04-13 LAB — COMPREHENSIVE METABOLIC PANEL
ALT: 21 U/L (ref 0–44)
AST: 22 U/L (ref 15–41)
Albumin: 3.7 g/dL (ref 3.5–5.0)
Alkaline Phosphatase: 90 U/L (ref 38–126)
Anion gap: 8 (ref 5–15)
BUN: 14 mg/dL (ref 8–23)
CO2: 25 mmol/L (ref 22–32)
Calcium: 9.2 mg/dL (ref 8.9–10.3)
Chloride: 106 mmol/L (ref 98–111)
Creatinine, Ser: 1.41 mg/dL — ABNORMAL HIGH (ref 0.61–1.24)
GFR calc Af Amer: 59 mL/min — ABNORMAL LOW (ref >60)
GFR calc non Af Amer: 51 mL/min — ABNORMAL LOW (ref >60)
Glucose, Bld: 97 mg/dL (ref 70–99)
Potassium: 5 mmol/L (ref 3.5–5.1)
Sodium: 139 mmol/L (ref 135–145)
Total Bilirubin: 1.6 mg/dL — ABNORMAL HIGH (ref 0.3–1.2)
Total Protein: 6.5 g/dL (ref 6.5–8.1)

## 2019-04-13 NOTE — Progress Notes (Signed)

## 2019-04-15 ENCOUNTER — Other Ambulatory Visit (HOSPITAL_COMMUNITY)
Admission: RE | Admit: 2019-04-15 | Discharge: 2019-04-15 | Disposition: A | Discharge status: Home / Self Care | Payer: Medicare Other | Source: Ambulatory Visit | Attending: Surgery | Admitting: Surgery

## 2019-04-15 DIAGNOSIS — Z01812 Encounter for preprocedural laboratory examination: Secondary | ICD-10-CM | POA: Insufficient documentation

## 2019-04-15 DIAGNOSIS — Z20828 Contact with and (suspected) exposure to other viral communicable diseases: Secondary | ICD-10-CM | POA: Diagnosis not present

## 2019-04-16 LAB — NOVEL CORONAVIRUS, NAA (HOSP ORDER, SEND-OUT TO REF LAB; TAT 18-24 HRS): SARS-CoV-2, NAA: NOT DETECTED

## 2019-04-19 ENCOUNTER — Ambulatory Visit (HOSPITAL_BASED_OUTPATIENT_CLINIC_OR_DEPARTMENT_OTHER): Payer: Medicare Other | Admitting: Anesthesiology

## 2019-04-19 ENCOUNTER — Ambulatory Visit (HOSPITAL_BASED_OUTPATIENT_CLINIC_OR_DEPARTMENT_OTHER)
Admission: RE | Admit: 2019-04-19 | Discharge: 2019-04-19 | Disposition: A | Discharge status: Home / Self Care | Payer: Medicare Other | Attending: Surgery | Admitting: Surgery

## 2019-04-19 ENCOUNTER — Encounter (HOSPITAL_BASED_OUTPATIENT_CLINIC_OR_DEPARTMENT_OTHER): Payer: Self-pay

## 2019-04-19 ENCOUNTER — Encounter (HOSPITAL_BASED_OUTPATIENT_CLINIC_OR_DEPARTMENT_OTHER)
Admission: RE | Disposition: A | Discharge status: Home / Self Care | Payer: Self-pay | Source: Home / Self Care | Attending: Surgery

## 2019-04-19 ENCOUNTER — Other Ambulatory Visit: Payer: Self-pay

## 2019-04-19 DIAGNOSIS — I2510 Atherosclerotic heart disease of native coronary artery without angina pectoris: Secondary | ICD-10-CM | POA: Diagnosis not present

## 2019-04-19 DIAGNOSIS — K4021 Bilateral inguinal hernia, without obstruction or gangrene, recurrent: Secondary | ICD-10-CM | POA: Diagnosis not present

## 2019-04-19 DIAGNOSIS — Z87891 Personal history of nicotine dependence: Secondary | ICD-10-CM | POA: Diagnosis not present

## 2019-04-19 DIAGNOSIS — N183 Chronic kidney disease, stage 3 unspecified: Secondary | ICD-10-CM | POA: Diagnosis not present

## 2019-04-19 DIAGNOSIS — G8918 Other acute postprocedural pain: Secondary | ICD-10-CM | POA: Diagnosis not present

## 2019-04-19 HISTORY — PX: INGUINAL HERNIA REPAIR: SHX194

## 2019-04-19 SURGERY — REPAIR, HERNIA, INGUINAL, BILATERAL, ADULT
Anesthesia: General | Site: Groin | Laterality: Bilateral

## 2019-04-19 MED ORDER — LACTATED RINGERS IV SOLN
INTRAVENOUS | Status: DC
  Administered 2019-04-19 (×2): via INTRAVENOUS

## 2019-04-19 MED ORDER — MIDAZOLAM HCL 2 MG/2ML IJ SOLN
INTRAMUSCULAR | Status: AC
  Filled 2019-04-19: qty 2

## 2019-04-19 MED ORDER — PROPOFOL 10 MG/ML IV BOLUS
INTRAVENOUS | Status: AC
  Filled 2019-04-19: qty 20

## 2019-04-19 MED ORDER — CHLORHEXIDINE GLUCONATE CLOTH 2 % EX PADS: 6.0000 | MEDICATED_PAD | Freq: Once | CUTANEOUS | Status: DC

## 2019-04-19 MED ORDER — ACETAMINOPHEN 500 MG PO TABS
ORAL_TABLET | ORAL | Status: AC
  Filled 2019-04-19: qty 2

## 2019-04-19 MED ORDER — CEFAZOLIN SODIUM-DEXTROSE 2-4 GM/100ML-% IV SOLN
2.0000 g | INTRAVENOUS | Status: AC
  Administered 2019-04-19: 2 g via INTRAVENOUS

## 2019-04-19 MED ORDER — MIDAZOLAM HCL 2 MG/2ML IJ SOLN
1.0000 mg | INTRAMUSCULAR | Status: DC | PRN
  Administered 2019-04-19: 2 mg via INTRAVENOUS

## 2019-04-19 MED ORDER — FENTANYL CITRATE (PF) 100 MCG/2ML IJ SOLN
INTRAMUSCULAR | Status: AC
  Filled 2019-04-19: qty 2

## 2019-04-19 MED ORDER — LIDOCAINE HCL (CARDIAC) PF 100 MG/5ML IV SOSY
PREFILLED_SYRINGE | INTRAVENOUS | Status: DC | PRN
  Administered 2019-04-19: 100 mg via INTRAVENOUS

## 2019-04-19 MED ORDER — OXYCODONE HCL 5 MG PO TABS: 5.0000 mg | ORAL_TABLET | Freq: Four times a day (QID) | ORAL | 0 refills | Status: DC | PRN

## 2019-04-19 MED ORDER — MEPERIDINE HCL 25 MG/ML IJ SOLN: 6.2500 mg | INTRAMUSCULAR | Status: DC | PRN

## 2019-04-19 MED ORDER — ACETAMINOPHEN 500 MG PO TABS
1000.0000 mg | ORAL_TABLET | ORAL | Status: AC
  Administered 2019-04-19: 1000 mg via ORAL

## 2019-04-19 MED ORDER — GABAPENTIN 300 MG PO CAPS
ORAL_CAPSULE | ORAL | Status: AC
  Filled 2019-04-19: qty 1

## 2019-04-19 MED ORDER — METOCLOPRAMIDE HCL 5 MG/ML IJ SOLN: 10.0000 mg | Freq: Once | INTRAMUSCULAR | Status: DC | PRN

## 2019-04-19 MED ORDER — BUPIVACAINE HCL (PF) 0.5 % IJ SOLN
INTRAMUSCULAR | Status: DC | PRN
  Administered 2019-04-19 (×2): 15 mL

## 2019-04-19 MED ORDER — PROPOFOL 10 MG/ML IV BOLUS
INTRAVENOUS | Status: DC | PRN
  Administered 2019-04-19: 110 mg via INTRAVENOUS

## 2019-04-19 MED ORDER — DEXAMETHASONE SODIUM PHOSPHATE 4 MG/ML IJ SOLN
INTRAMUSCULAR | Status: DC | PRN
  Administered 2019-04-19: 5 mg via INTRAVENOUS

## 2019-04-19 MED ORDER — FENTANYL CITRATE (PF) 100 MCG/2ML IJ SOLN
50.0000 ug | INTRAMUSCULAR | Status: AC | PRN
  Administered 2019-04-19: 50 ug via INTRAVENOUS
  Administered 2019-04-19: 100 ug via INTRAVENOUS
  Administered 2019-04-19: 50 ug via INTRAVENOUS

## 2019-04-19 MED ORDER — BUPIVACAINE LIPOSOME 1.3 % IJ SUSP
INTRAMUSCULAR | Status: DC | PRN
  Administered 2019-04-19 (×2): 10 mL

## 2019-04-19 MED ORDER — FENTANYL CITRATE (PF) 100 MCG/2ML IJ SOLN: 25.0000 ug | INTRAMUSCULAR | Status: DC | PRN

## 2019-04-19 MED ORDER — DEXMEDETOMIDINE HCL 200 MCG/2ML IV SOLN
INTRAVENOUS | Status: DC | PRN
  Administered 2019-04-19 (×7): 4 ug via INTRAVENOUS

## 2019-04-19 MED ORDER — EPHEDRINE SULFATE 50 MG/ML IJ SOLN
INTRAMUSCULAR | Status: DC | PRN
  Administered 2019-04-19 (×3): 5 mg via INTRAVENOUS

## 2019-04-19 MED ORDER — CEFAZOLIN SODIUM-DEXTROSE 2-4 GM/100ML-% IV SOLN
INTRAVENOUS | Status: AC
  Filled 2019-04-19: qty 100

## 2019-04-19 MED ORDER — GABAPENTIN 300 MG PO CAPS
300.0000 mg | ORAL_CAPSULE | ORAL | Status: AC
  Administered 2019-04-19: 300 mg via ORAL

## 2019-04-19 MED ORDER — BUPIVACAINE-EPINEPHRINE 0.25% -1:200000 IJ SOLN
INTRAMUSCULAR | Status: DC | PRN
  Administered 2019-04-19: 10 mL

## 2019-04-19 MED ORDER — ONDANSETRON HCL 4 MG/2ML IJ SOLN
INTRAMUSCULAR | Status: DC | PRN
  Administered 2019-04-19: 4 mg via INTRAVENOUS

## 2019-04-19 SURGICAL SUPPLY — 40 items
ADH SKN CLS APL DERMABOND .7 (GAUZE/BANDAGES/DRESSINGS) ×1
APL PRP STRL LF DISP 70% ISPRP (MISCELLANEOUS) ×1
BLADE CLIPPER SURG (BLADE) ×1 IMPLANT
BLADE SURG 10 STRL SS (BLADE) ×2 IMPLANT
BLADE SURG 15 STRL LF DISP TIS (BLADE) ×1 IMPLANT
BLADE SURG 15 STRL SS (BLADE) ×2
CHLORAPREP W/TINT 26 (MISCELLANEOUS) ×2 IMPLANT
COVER BACK TABLE REUSABLE LG (DRAPES) ×2 IMPLANT
COVER MAYO STAND REUSABLE (DRAPES) ×2 IMPLANT
DECANTER SPIKE VIAL GLASS SM (MISCELLANEOUS) ×2 IMPLANT
DERMABOND ADVANCED (GAUZE/BANDAGES/DRESSINGS) ×1
DERMABOND ADVANCED .7 DNX12 (GAUZE/BANDAGES/DRESSINGS) ×1 IMPLANT
DRAIN PENROSE 1/2X12 LTX STRL (WOUND CARE) ×2 IMPLANT
DRAPE LAPAROTOMY TRNSV 102X78 (DRAPES) ×2 IMPLANT
DRAPE UTILITY XL STRL (DRAPES) ×2 IMPLANT
ELECT COATED BLADE 2.86 ST (ELECTRODE) ×2 IMPLANT
ELECT REM PT RETURN 9FT ADLT (ELECTROSURGICAL) ×2
ELECTRODE REM PT RTRN 9FT ADLT (ELECTROSURGICAL) ×1 IMPLANT
GLOVE BIOGEL PI IND STRL 8 (GLOVE) ×1 IMPLANT
GLOVE BIOGEL PI INDICATOR 8 (GLOVE) ×1
GLOVE ECLIPSE 8.0 STRL XLNG CF (GLOVE) ×2 IMPLANT
GOWN STRL REUS W/ TWL LRG LVL3 (GOWN DISPOSABLE) ×2 IMPLANT
GOWN STRL REUS W/TWL LRG LVL3 (GOWN DISPOSABLE) ×4
MESH HERNIA SYS ULTRAPRO LRG (Mesh General) ×1 IMPLANT
MESH PARIETEX PROGRIP RIGHT (Mesh General) ×1 IMPLANT
NDL HYPO 25X1 1.5 SAFETY (NEEDLE) ×1 IMPLANT
NEEDLE HYPO 25X1 1.5 SAFETY (NEEDLE) ×2 IMPLANT
NS IRRIG 1000ML POUR BTL (IV SOLUTION) ×1 IMPLANT
PACK BASIN DAY SURGERY FS (CUSTOM PROCEDURE TRAY) ×2 IMPLANT
PENCIL BUTTON HOLSTER BLD 10FT (ELECTRODE) ×2 IMPLANT
SLEEVE SCD COMPRESS KNEE MED (MISCELLANEOUS) ×2 IMPLANT
SPONGE LAP 4X18 RFD (DISPOSABLE) ×2 IMPLANT
SUT MON AB 4-0 PC3 18 (SUTURE) ×4 IMPLANT
SUT NOVA NAB DX-16 0-1 5-0 T12 (SUTURE) ×4 IMPLANT
SUT VIC AB 0 SH 27 (SUTURE) ×4 IMPLANT
SUT VIC AB 2-0 SH 27 (SUTURE) ×4
SUT VIC AB 2-0 SH 27XBRD (SUTURE) ×2 IMPLANT
SUT VICRYL 3-0 CR8 SH (SUTURE) ×4 IMPLANT
SYR CONTROL 10ML LL (SYRINGE) ×2 IMPLANT
TOWEL GREEN STERILE FF (TOWEL DISPOSABLE) ×4 IMPLANT

## 2019-04-19 NOTE — Anesthesia Procedure Notes (Signed)
Anesthesia Regional Block: TAP block   Pre-Anesthetic Checklist: ,, timeout performed, Correct Patient, Correct Site, Correct Laterality, Correct Procedure, Correct Position, site marked, Risks and benefits discussed, pre-op evaluation,  At surgeon's request and post-op pain management  Laterality: Left  Prep: Maximum Sterile Barrier Precautions used, chloraprep       Needles:  Injection technique: Single-shot  Needle Type: Echogenic Needle     Needle Length: 9cm  Needle Gauge: 21     Additional Needles:   Procedures:,,,, ultrasound used (permanent image in chart),,,,  Narrative:  Start time: 04/19/2019 12:21 PM End time: 04/19/2019 12:30 PM Injection made incrementally with aspirations every 5 mL.  Performed by: Personally  Anesthesiologist: Barnet Glasgow, MD  Additional Notes: Block assessed. Patient tolerated procedure well.

## 2019-04-19 NOTE — Anesthesia Procedure Notes (Addendum)
Anesthesia Regional Block: TAP block   Pre-Anesthetic Checklist: ,, timeout performed, Correct Patient, Correct Site, Correct Laterality, Correct Procedure, Correct Position, site marked, Risks and benefits discussed, pre-op evaluation,  At surgeon's request and post-op pain management  Laterality: Right  Prep: Maximum Sterile Barrier Precautions used, chloraprep       Needles:  Injection technique: Single-shot  Needle Type: Echogenic Needle     Needle Length: 9cm  Needle Gauge: 21     Additional Needles:   Procedures:,,,, ultrasound used (permanent image in chart),,,,  Narrative:  Start time: 04/19/2019 12:12 PM End time: 04/19/2019 12:20 PM Injection made incrementally with aspirations every 5 mL.  Performed by: Personally  Anesthesiologist: Barnet Glasgow, MD  Additional Notes: Block assessed. Patient tolerated procedure well.

## 2019-04-19 NOTE — Transfer of Care (Signed)
Immediate Anesthesia Transfer of Care Note  Patient: Anthony King  Procedure(s) Performed: BILATERAL OPEN INGUINAL HERNIA REPAIR WITH MESH (Bilateral Groin)  Patient Location: PACU  Anesthesia Type:General  Level of Consciousness: drowsy and patient cooperative  Airway & Oxygen Therapy: Patient Spontanous Breathing and Patient connected to nasal cannula oxygen  Post-op Assessment: Report given to RN and Post -op Vital signs reviewed and stable  Post vital signs: Reviewed and stable  Last Vitals:  Vitals Value Taken Time  BP 126/69 04/19/19 1432  Temp    Pulse 83 04/19/19 1433  Resp 15 04/19/19 1433  SpO2 95 % 04/19/19 1433  Vitals shown include unvalidated device data.  Last Pain:  Vitals:   04/19/19 1147  TempSrc: Oral  PainSc: 2          Complications: No apparent anesthesia complications

## 2019-04-19 NOTE — Anesthesia Postprocedure Evaluation (Signed)
Anesthesia Post Note  Patient: Anthony King  Procedure(s) Performed: BILATERAL OPEN INGUINAL HERNIA REPAIR WITH MESH (Bilateral Groin)     Patient location during evaluation: PACU Anesthesia Type: General Level of consciousness: awake and alert Pain management: pain level controlled Vital Signs Assessment: post-procedure vital signs reviewed and stable Respiratory status: spontaneous breathing, nonlabored ventilation, respiratory function stable and patient connected to nasal cannula oxygen Cardiovascular status: blood pressure returned to baseline and stable Postop Assessment: no apparent nausea or vomiting Anesthetic complications: no    Last Vitals:  Vitals:   04/19/19 1545 04/19/19 1615  BP: 122/76 125/77  Pulse: 79 79  Resp: 15 16  Temp:  36.6 C  SpO2: 91% 95%    Last Pain:  Vitals:   04/19/19 1615  TempSrc:   PainSc: 4                  Barnet Glasgow

## 2019-04-19 NOTE — Op Note (Signed)
Preoperative diagnosis: Bilateral inguinal hernia recurrent without incarceration, obstruction or strangulation  Postoperative diagnosis: Same  Open repair of bilateral inguinal hernia with ultra Pro hernia system the left and Prograf on the right mesh  Surgeon: Erroll Luna, MD  Anesthesia: General with bilateral TAP blocks and local anesthetic  EBL: Minimal  Specimen: None  Drains: None  IV fluids: Per anesthesia record  Indications for procedure: The patient is a 68 year old male who in 2013 had a laparoscopic left inguinal hernia repair.  Many years ago he had an open right inguinal hernia repair.  He was not sure if there is mesh or not.  He is developed recurrent bilateral inguinal hernia and presents for repair.  I discussed open and laparoscopic techniques.  Given his previous laparoscopic technique I felt that open would be his best choice since that laparoscopic technique and failed.  We discussed risks and benefits of surgery and potential complications of mesh use.The risk of hernia repair include bleeding,  Infection,   Recurrence of the hernia,  Mesh use, chronic pain,  Organ injury,  Bowel injury,  Bladder injury,   nerve injury with numbness around the incision,  Death,  and worsening of preexisting  medical problems.  The alternatives to surgery have been discussed as well..  Long term expectations of both operative and non operative treatments have been discussed.   The patient agrees to proceed.   Description of procedure: The patient was met in the holding area.  Underwent bilateral T AP block.  Questions were answered.  He was taken back to the operative room.  Is placed supine upon the OR table.  After induction of general esthesia both groins were prepped and draped in sterile fashion timeout was performed.  Left side was done first.  Incision was made left inguinal crease.  Dissection was carried down through the subcu cutaneous fat into the aponeurosis of the left  external bleed was encountered.  This was opened in the direction of its fibers to expose the cord structure.  Quarter inch Penrose drain was wrapped around the cord and a moderate size direct defect was encountered.  There is no indirect defect that I can find.  The cord structures as well as the ilioinguinal nerve were retracted out of the field.  I opted to repair the defect in the floor of the inguinal canal with and ultra pro hernia system.  I placed the inner leaflet into the defect into the floor.  This appeared to be where the mesh had contracted and separated down from the floor.  I then put the onlay on the floor and cut a slipped the cord structures.  The onlay was secured to the pubic tubercle, shelving edge angle ligament and conjoined tendon.  Care was taken to avoid the iliohypogastric and ilioinguinal nerves.  We then closed the mesh around the cord structures with 1-0 Novafil.  There is ample room for the cord and nerve exit.  We then inspected for hemostasis and this was excellent.  We then closed the aponeurosis of the external oblique with 2-0 Vicryl.  3-0 Vicryl was used to close the subcutaneous tissue and 4-0 Monocryl was used to close the skin.  Dermabond applied.  The right side was done.  A large scar from previous hernia surgery noted.  I went below this toward the pubis.  Incision was made in the right inguinal crease.  Dissection was carried out the fat until the aponeurosis of the external bleed was identified and  opened in the direction of its fibers.  This was opened through the external ring.  Cord structures and nerves were retracted with a corkscrew and restrain the field.  He had a significant weakening of the floor of his inguinal canal with bulging.  There is no indirect defect upon close examination.  I then used a piece of progrip mesh.  This was oriented and placed onto the floor and then wrapped around the cord structures.  It secured the pubic tubercle, shelving edge of  the ligament and conjoined tendon with 1-0 Novafil.  Care was taken not to entrap the nerve or cord structures.  There is ample room for the cord to exit on the right and the mesh laid nicely with a good coverage of the floor.  There is no indirect defect on the right.  We then ensured hemostasis and closed the aponeurosis with 2-0 Vicryl.  3-0 Vicryl was used to close Scarpa's fascia and 4 Monocryl was used to close the skin in a subcuticular fashion.  Dermabond applied to both.  All final counts were found to be correct.  The patient was awoke extubated taken recovery in satisfactory condition.

## 2019-04-19 NOTE — Anesthesia Procedure Notes (Signed)
Procedure Name: LMA Insertion Date/Time: 04/19/2019 12:43 PM Performed by: Lavonia Dana, CRNA Pre-anesthesia Checklist: Patient identified, Emergency Drugs available, Suction available and Patient being monitored Patient Re-evaluated:Patient Re-evaluated prior to induction Oxygen Delivery Method: Circle system utilized Preoxygenation: Pre-oxygenation with 100% oxygen Induction Type: IV induction Ventilation: Mask ventilation without difficulty LMA: LMA inserted LMA Size: 5.0 Number of attempts: 1 Airway Equipment and Method: Bite block Placement Confirmation: positive ETCO2 Tube secured with: Tape Dental Injury: Teeth and Oropharynx as per pre-operative assessment

## 2019-04-19 NOTE — Anesthesia Preprocedure Evaluation (Addendum)
Anesthesia Evaluation  Patient identified by MRN, date of birth, ID band Patient awake    Reviewed: Allergy & Precautions, Patient's Chart, lab work & pertinent test results  Airway Mallampati: II  TM Distance: >3 FB Neck ROM: Full    Dental no notable dental hx. (+) Implants, Partial Lower   Pulmonary former smoker,    Pulmonary exam normal breath sounds clear to auscultation       Cardiovascular + CAD  Normal cardiovascular exam Rhythm:Regular Rate:Normal     Neuro/Psych negative neurological ROS  negative psych ROS   GI/Hepatic Neg liver ROS, GERD  ,  Endo/Other  negative endocrine ROS  Renal/GU K+ 5.0 Cr 1.41     Musculoskeletal   Abdominal   Peds  Hematology hgb 5.0 Plt 211   Anesthesia Other Findings   Reproductive/Obstetrics negative OB ROS                           Anesthesia Physical Anesthesia Plan  ASA: III  Anesthesia Plan: General   Post-op Pain Management:    Induction: Intravenous  PONV Risk Score and Plan: Treatment may vary due to age or medical condition  Airway Management Planned: LMA  Additional Equipment: None  Intra-op Plan:   Post-operative Plan:   Informed Consent: I have reviewed the patients History and Physical, chart, labs and discussed the procedure including the risks, benefits and alternatives for the proposed anesthesia with the patient or authorized representative who has indicated his/her understanding and acceptance.     Dental advisory given  Plan Discussed with:   Anesthesia Plan Comments:         Anesthesia Quick Evaluation

## 2019-04-19 NOTE — Interval H&P Note (Signed)
History and Physical Interval Note:  04/19/2019 12:02 PM  Anthony King  has presented today for surgery, with the diagnosis of [place].  The various methods of treatment have been discussed with the patient and family. After consideration of risks, benefits and other options for treatment, the patient has consented to  Procedure(s) with comments: BILATERAL OPEN INGUINAL HERNIA REPAIR WITH MESH (Bilateral) - TAP BLOCK ANESTHESIA AS WELL as a surgical intervention.  The patient's history has been reviewed, patient examined, no change in status, stable for surgery.  I have reviewed the patient's chart and labs.  Questions were answered to the patient's satisfaction.     Wishek

## 2019-04-19 NOTE — Discharge Instructions (Signed)
[facility] _______[facility] ° °UMBILICAL OR INGUINAL HERNIA REPAIR: POST OP INSTRUCTIONS ° °Always review your discharge instruction sheet given to you by the facility where your surgery was performed. °IF YOU HAVE DISABILITY OR FAMILY LEAVE FORMS, YOU MUST BRING THEM TO THE OFFICE FOR PROCESSING.   °DO NOT GIVE THEM TO YOUR DOCTOR. ° °1. A  prescription for pain medication may be given to you upon discharge.  Take your pain medication as prescribed, if needed.  If narcotic pain medicine is not needed, then you may take acetaminophen (Tylenol) or ibuprofen (Advil) as needed. °2. Take your usually prescribed medications unless otherwise directed. °If you need a refill on your pain medication, please contact your pharmacy.  They will contact our office to request authorization. Prescriptions will not be filled after 5 pm or on week-ends. °3. You should follow a light diet the first 24 hours after arrival home, such as soup and crackers, etc.  Be sure to include lots of fluids daily.  Resume your normal diet the day after surgery. °4.Most patients will experience some swelling and bruising around the umbilicus or in the groin and scrotum.  Ice packs and reclining will help.  Swelling and bruising can take several days to resolve.  °6. It is common to experience some constipation if taking pain medication after surgery.  Increasing fluid intake and taking a stool softener (such as Colace) will usually help or prevent this problem from occurring.  A mild laxative (Milk of Magnesia or Miralax) should be taken according to package directions if there are no bowel movements after 48 hours. °7. Unless discharge instructions indicate otherwise, you may remove your bandages 24-48 hours after surgery, and you may shower at that time.  You may have steri-strips (small skin tapes) in place directly over the incision.  These strips should be left on the skin for 7-10 days.  If your surgeon used skin glue on the  incision, you may shower in 24 hours.  The glue will flake off over the next 2-3 weeks.  Any sutures or staples will be removed at the office during your follow-up visit. °8. ACTIVITIES:  You may resume regular (light) daily activities beginning the next day--such as daily self-care, walking, climbing stairs--gradually increasing activities as tolerated.  You may have sexual intercourse when it is comfortable.  Refrain from any heavy lifting or straining until approved by your doctor. ° °a.You may drive when you are no longer taking prescription pain medication, you can comfortably wear a seatbelt, and you can safely maneuver your car and apply brakes. °b.RETURN TO WORK:   °_____________________________________________ ° °9.You should see your doctor in the office for a follow-up appointment approximately 2-3 weeks after your surgery.  Make sure that you call for this appointment within a day or two after you arrive home to insure a convenient appointment time. °10.OTHER INSTRUCTIONS: _________________________ °   _____________________________________ ° °WHEN TO CALL YOUR DOCTOR: °1. Fever over 101.0 °2. Inability to urinate °3. Nausea and/or vomiting °4. Extreme swelling or bruising °5. Continued bleeding from incision. °6. Increased pain, redness, or drainage from the incision ° °The clinic staff is available to answer your questions during regular business hours.  Please don’t hesitate to call and ask to speak to one of the nurses for clinical concerns.  If you have a medical emergency, go to the nearest emergency room or call 911.  A surgeon from [facility] is always on call at the hospital ° ° °  [address], [place], [place], [place]  [address] ?  [address], [place], [place]   [address] [phone] ? [phone] ? FAX [phone] Web site: [email or web address]     No Tylenol until 5:45 PM on 04/19/2019.    Post Anesthesia Home Care  Instructions  Activity: Get plenty of rest for the remainder of the day. A responsible individual must stay with you for 24 hours following the procedure.  For the next 24 hours, DO NOT: -Drive a car -Paediatric nurse -Drink alcoholic beverages -Take any medication unless instructed by your physician -Make any legal decisions or sign important papers.  Meals: Start with liquid foods such as gelatin or soup. Progress to regular foods as tolerated. Avoid greasy, spicy, heavy foods. If nausea and/or vomiting occur, drink only clear liquids until the nausea and/or vomiting subsides. Call your physician if vomiting continues.  Special Instructions/Symptoms: Your throat may feel dry or sore from the anesthesia or the breathing tube placed in your throat during surgery. If this causes discomfort, gargle with warm salt water. The discomfort should disappear within 24 hours.  If you had a scopolamine patch placed behind your ear for the management of post- operative nausea and/or vomiting:  1. The medication in the patch is effective for 72 hours, after which it should be removed.  Wrap patch in a tissue and discard in the trash. Wash hands thoroughly with soap and water. 2. You may remove the patch earlier than 72 hours if you experience unpleasant side effects which may include dry mouth, dizziness or visual disturbances. 3. Avoid touching the patch. Wash your hands with soap and water after contact with the patch.      Regional Anesthesia Blocks  1. Numbness or the inability to move the "blocked" extremity may last from 3-48 hours after placement. The length of time depends on the medication injected and your individual response to the medication. If the numbness is not going away after 48 hours, call your surgeon.  2. The extremity that is blocked will need to be protected until the numbness is gone and the  Strength has returned. Because you cannot feel it, you will need to take extra care  to avoid injury. Because it may be weak, you may have difficulty moving it or using it. You may not know what position it is in without looking at it while the block is in effect.  3. For blocks in the legs and feet, returning to weight bearing and walking needs to be done carefully. You will need to wait until the numbness is entirely gone and the strength has returned. You should be able to move your leg and foot normally before you try and bear weight or walk. You will need someone to be with you when you first try to ensure you do not fall and possibly risk injury.  4. Bruising and tenderness at the needle site are common side effects and will resolve in a few days.  5. Persistent numbness or new problems with movement should be communicated to the surgeon or the [place] [phone] [place] [phone]).    Information for Discharge Teaching: EXPAREL (bupivacaine liposome injectable suspension)   Your surgeon or anesthesiologist gave you EXPAREL(bupivacaine) to help control your pain after surgery.   EXPAREL is a local anesthetic that provides pain relief by numbing the tissue around the surgical site.  EXPAREL is designed to release pain medication over time and can control pain  for up to 72 hours.  Depending on how you respond to EXPAREL, you may require less pain medication during your recovery.  Possible side effects:  Temporary loss of sensation or ability to move in the area where bupivacaine was injected.  Nausea, vomiting, constipation  Rarely, numbness and tingling in your mouth or lips, lightheadedness, or anxiety may occur.  Call your doctor right away if you think you may be experiencing any of these sensations, or if you have other questions regarding possible side effects.  Follow all other discharge instructions given to you by your surgeon or nurse. Eat a healthy diet and drink plenty of water or other fluids.  If you return  to the hospital for any reason within 96 hours following the administration of EXPAREL, it is important for health care providers to know that you have received this anesthetic. A teal colored band has been placed on your arm with the date, time and amount of EXPAREL you have received in order to alert and inform your health care providers. Please leave this armband in place for the full 96 hours following administration, and then you may remove the band.

## 2019-04-19 NOTE — Progress Notes (Signed)
Assisted Dr. Valma Cava with right, left, ultrasound guided, transabdominal plane block. Side rails up, monitors on throughout procedure. See vital signs in flow sheet. Tolerated Procedure well.

## 2019-04-20 ENCOUNTER — Encounter (HOSPITAL_BASED_OUTPATIENT_CLINIC_OR_DEPARTMENT_OTHER): Payer: Self-pay | Admitting: Surgery

## 2019-04-20 NOTE — Addendum Note (Signed)
Addendum  created 04/20/19 1201 by Loriann Bosserman, Ernesta Amble, CRNA   Charge Capture section accepted

## 2019-06-07 DIAGNOSIS — K219 Gastro-esophageal reflux disease without esophagitis: Secondary | ICD-10-CM | POA: Diagnosis not present

## 2019-06-07 DIAGNOSIS — N182 Chronic kidney disease, stage 2 (mild): Secondary | ICD-10-CM | POA: Diagnosis not present

## 2019-06-07 DIAGNOSIS — E785 Hyperlipidemia, unspecified: Secondary | ICD-10-CM | POA: Diagnosis not present

## 2019-06-07 DIAGNOSIS — Z87442 Personal history of urinary calculi: Secondary | ICD-10-CM | POA: Diagnosis not present

## 2019-06-16 ENCOUNTER — Ambulatory Visit: Payer: Self-pay

## 2019-06-16 NOTE — Telephone Encounter (Signed)
Incoming call from Patient with a complaint of left ankle swollen ankle with discoloration, down to toes.   Onset Thursday night.  Reports ankle is 3x times larger than usual.   Pain is rated severe.  Denies chest pain or difficulty breathing fever or calf pain. Reviewed protocol with patient.  Reccomened Patient go to ED or Urgent care.  Patient states he will go to Ed on [address].         Reason for Disposition . Entire foot is cool or blue in comparison to other side  Answer Assessment - Initial Assessment Questions 1. LOCATION: "Which joint is swollen?"   Left  Ankle heel  2. ONSET: "When did the swelling start?"     Thursday night 3. SIZE: "How large is the swelling?"     3x  Bigger than should be  4. PAIN: "Is there any pain?" If so, ask: "How bad is it?" (Scale 1-10; or mild, moderate, severe)     Severe  5. CAUSE: "What do you think caused the swollen joint?"    Unknown  6. OTHER SYMPTOMS: "Do you have any other symptoms?" (e.g., fever, chest pain, difficulty breathing, calf pain)     Denies  7. PREGNANCY: "Is there any chance you are pregnant?" "When was your last menstrual period?"     na  Protocols used: LEG SWELLING AND EDEMA-A-AH, ANKLE SWELLING-A-AH

## 2019-06-16 NOTE — Telephone Encounter (Signed)
FYI

## 2019-06-16 NOTE — Telephone Encounter (Signed)
agree

## 2019-06-16 NOTE — Telephone Encounter (Signed)
This encounter was created in error - please disregard.

## 2019-08-21 ENCOUNTER — Encounter: Payer: Self-pay | Admitting: Gastroenterology

## 2019-08-31 ENCOUNTER — Other Ambulatory Visit: Payer: Self-pay

## 2019-08-31 ENCOUNTER — Ambulatory Visit (AMBULATORY_SURGERY_CENTER): Payer: Self-pay

## 2019-08-31 VITALS — Temp 97.7°F | Ht 70.0 in | Wt 217.0 lb

## 2019-08-31 DIAGNOSIS — Z01818 Encounter for other preprocedural examination: Secondary | ICD-10-CM

## 2019-08-31 DIAGNOSIS — Z86010 Personal history of colonic polyps: Secondary | ICD-10-CM

## 2019-08-31 MED ORDER — PLENVU 140 G PO SOLR: 1.0000 | Freq: Once | ORAL | 0 refills | Status: AC

## 2019-08-31 NOTE — Progress Notes (Signed)
No egg or soy allergy known to patient  No issues with past sedation with any surgeries  or procedures, no intubation problems  No diet pills per patient No home 02 use per patient  No blood thinners per patient  Pt denies issues with constipation  No A fib or A flutter  EMMI video sent to pt's e mail   Plenvu sample given.  Lot [id number] exp4/21 Pt states he does take metamucil daily, instructed to stop metamucil 5 days before the procedure and wrote this on his typed instructions.  Due to the COVID-19 pandemic we are asking patients to follow these guidelines. Please only bring one care partner. Please be aware that your care partner may wait in the car in the parking lot or if they feel like they will be too hot to wait in the car, they may wait in the lobby on the 4th floor. All care partners are required to wear a mask the entire time (we do not have any that we can provide them), they need to practice social distancing, and we will do a Covid check for all patient's and care partners when you arrive. Also we will check their temperature and your temperature. If the care partner waits in their car they need to stay in the parking lot the entire time and we will call them on their cell phone when the patient is ready for discharge so they can bring the car to the front of the building. Also all patient's will need to wear a mask into building.

## 2019-09-05 ENCOUNTER — Other Ambulatory Visit: Payer: Self-pay | Admitting: Surgery

## 2019-09-05 DIAGNOSIS — R1031 Right lower quadrant pain: Secondary | ICD-10-CM

## 2019-09-05 DIAGNOSIS — R1032 Left lower quadrant pain: Secondary | ICD-10-CM

## 2019-09-11 ENCOUNTER — Ambulatory Visit
Admission: RE | Admit: 2019-09-11 | Discharge: 2019-09-11 | Disposition: A | Discharge status: Home / Self Care | Payer: Medicare Other | Source: Ambulatory Visit | Attending: Surgery | Admitting: Surgery

## 2019-09-11 ENCOUNTER — Other Ambulatory Visit: Payer: Self-pay

## 2019-09-11 DIAGNOSIS — R1031 Right lower quadrant pain: Secondary | ICD-10-CM

## 2019-09-11 DIAGNOSIS — K8020 Calculus of gallbladder without cholecystitis without obstruction: Secondary | ICD-10-CM | POA: Diagnosis not present

## 2019-09-11 MED ORDER — IOPAMIDOL (ISOVUE-300) INJECTION 61%
100.0000 mL | Freq: Once | INTRAVENOUS | Status: AC | PRN
  Administered 2019-09-11: 100 mL via INTRAVENOUS

## 2019-09-12 ENCOUNTER — Other Ambulatory Visit: Payer: Self-pay | Admitting: Gastroenterology

## 2019-09-12 ENCOUNTER — Ambulatory Visit (INDEPENDENT_AMBULATORY_CARE_PROVIDER_SITE_OTHER): Payer: Medicare Other

## 2019-09-12 DIAGNOSIS — Z1159 Encounter for screening for other viral diseases: Secondary | ICD-10-CM

## 2019-09-12 LAB — SARS CORONAVIRUS 2 (TAT 6-24 HRS): SARS Coronavirus 2: NEGATIVE

## 2019-09-15 ENCOUNTER — Encounter: Payer: Self-pay | Admitting: Gastroenterology

## 2019-09-15 ENCOUNTER — Other Ambulatory Visit: Payer: Self-pay

## 2019-09-15 ENCOUNTER — Ambulatory Visit (AMBULATORY_SURGERY_CENTER): Payer: Medicare Other | Admitting: Gastroenterology

## 2019-09-15 VITALS — BP 109/64 | HR 60 | Temp 97.5°F | Resp 17 | Ht 70.0 in | Wt 217.0 lb

## 2019-09-15 DIAGNOSIS — D123 Benign neoplasm of transverse colon: Secondary | ICD-10-CM | POA: Diagnosis not present

## 2019-09-15 DIAGNOSIS — Z86010 Personal history of colonic polyps: Secondary | ICD-10-CM

## 2019-09-15 DIAGNOSIS — D122 Benign neoplasm of ascending colon: Secondary | ICD-10-CM | POA: Diagnosis not present

## 2019-09-15 DIAGNOSIS — Z1211 Encounter for screening for malignant neoplasm of colon: Secondary | ICD-10-CM | POA: Diagnosis not present

## 2019-09-15 MED ORDER — SODIUM CHLORIDE 0.9 % IV SOLN: 500.0000 mL | Freq: Once | INTRAVENOUS | Status: DC

## 2019-09-15 NOTE — Progress Notes (Signed)
Called to room to assist during endoscopic procedure.  Patient ID and intended procedure confirmed with present staff. Received instructions for my participation in the procedure from the performing physician.  

## 2019-09-15 NOTE — Progress Notes (Signed)
Pt's states no medical or surgical changes since previsit or office visit.  [place]

## 2019-09-15 NOTE — Progress Notes (Signed)
A and O x3. Report to RN. Tolerated MAC anesthesia well.

## 2019-09-15 NOTE — Patient Instructions (Signed)
Impression/Recommendations:  Polyp handout given to patient. Diverticulosis handout given to patient. Hemorroid handout given to patient.  Resume previous diet. Continue present medications.  Await pathology results.YOU HAD AN ENDOSCOPIC PROCEDURE TODAY AT [facility]:   Refer to the procedure report that was given to you for any specific questions about what was found during the examination.  If the procedure report does not answer your questions, please call your gastroenterologist to clarify.  If you requested that your care partner not be given the details of your procedure findings, then the procedure report has been included in a sealed envelope for you to review at your convenience later.  YOU SHOULD EXPECT: Some feelings of bloating in the abdomen. Passage of more gas than usual.  Walking can help get rid of the air that was put into your GI tract during the procedure and reduce the bloating. If you had a lower endoscopy (such as a colonoscopy or flexible sigmoidoscopy) you may notice spotting of blood in your stool or on the toilet paper. If you underwent a bowel prep for your procedure, you may not have a normal bowel movement for a few days.  Please Note:  You might notice some irritation and congestion in your nose or some drainage.  This is from the oxygen used during your procedure.  There is no need for concern and it should clear up in a day or so.  SYMPTOMS TO REPORT IMMEDIATELY:   Following lower endoscopy (colonoscopy or flexible sigmoidoscopy):  Excessive amounts of blood in the stool  Significant tenderness or worsening of abdominal pains  Swelling of the abdomen that is new, acute  Fever of 100F or higher  For urgent or emergent issues, a gastroenterologist can be reached at any hour by calling [phone]. Do not use MyChart messaging for urgent concerns.    DIET:  We do recommend a small meal at first, but then you may proceed to your regular  diet.  Drink plenty of fluids but you should avoid alcoholic beverages for 24 hours.  ACTIVITY:  You should plan to take it easy for the rest of today and you should NOT DRIVE or use heavy machinery until tomorrow (because of the sedation medicines used during the test).    FOLLOW UP: Our staff will call the number listed on your records 48-72 hours following your procedure to check on you and address any questions or concerns that you may have regarding the information given to you following your procedure. If we do not reach you, we will leave a message.  We will attempt to reach you two times.  During this call, we will ask if you have developed any symptoms of COVID 19. If you develop any symptoms (ie: fever, flu-like symptoms, shortness of breath, cough etc.) before then, please call [phone].  If you test positive for Covid 19 in the 2 weeks post procedure, please call and report this information to [place].    If any biopsies were taken you will be contacted by phone or by letter within the next 1-3 weeks.  Please call us at [phone] if you have not heard about the biopsies in 3 weeks.    SIGNATURES/CONFIDENTIALITY: You and/or your care partner have signed paperwork which will be entered into your electronic medical record.  These signatures attest to the fact that that the information above on your After Visit Summary has been reviewed and is understood.  Full responsibility of the confidentiality of this  discharge information lies with you and/or your care-partner. 

## 2019-09-15 NOTE — Op Note (Signed)
[place] Patient Name: Anthony King Procedure Date: 09/15/2019 1:34 PM MRN: [id number] Endoscopist: Remo Lipps P. Havery Moros , MD Age: 69 Referring MD:  Date of Birth: 1950-10-02 Gender: Male Account #: [id number] Procedure:                Colonoscopy Indications:              High risk colon cancer surveillance: Personal                            history of colonic polyps - 6 adenomas removed 3                            years ago Medicines:                Monitored Anesthesia Care Procedure:                Pre-Anesthesia Assessment:                           - Prior to the procedure, a History and Physical                            was performed, and patient medications and                            allergies were reviewed. The patient's tolerance of                            previous anesthesia was also reviewed. The risks                            and benefits of the procedure and the sedation                            options and risks were discussed with the patient.                            All questions were answered, and informed consent                            was obtained. Prior Anticoagulants: The patient has                            taken no previous anticoagulant or antiplatelet                            agents. ASA Grade Assessment: II - A patient with                            mild systemic disease. After reviewing the risks                            and benefits, the patient was deemed in  satisfactory condition to undergo the procedure.                           After obtaining informed consent, the colonoscope                            was passed under direct vision. Throughout the                            procedure, the patient's blood pressure, pulse, and                            oxygen saturations were monitored continuously. The                            Colonoscope was introduced through the anus and                             advanced to the the cecum, identified by                            appendiceal orifice and ileocecal valve. The                            colonoscopy was performed without difficulty. The                            patient tolerated the procedure well. The quality                            of the bowel preparation was good. The ileocecal                            valve, appendiceal orifice, and rectum were                            photographed. Scope In: 1:38:53 PM Scope Out: 2:05:47 PM Scope Withdrawal Time: 0 hours 17 minutes 57 seconds  Total Procedure Duration: 0 hours 26 minutes 54 seconds  Findings:                 The perianal and digital rectal examinations were                            normal, hemorrhoids noted.                           Four sessile polyps were found in the ascending                            colon. The polyps were 3 to 4 mm in size. These                            polyps were removed with a cold snare. Resection  and retrieval were complete.                           Two sessile polyps were found in the hepatic                            flexure. The polyps were 3 to 6 mm in size. These                            polyps were removed with a cold snare. Resection                            and retrieval were complete.                           Four sessile polyps were found in the transverse                            colon. The polyps were 3 to 4 mm in size. These                            polyps were removed with a cold snare. Resection                            and retrieval were complete.                           Multiple medium-mouthed diverticula were found in                            the transverse colon, cecum and left colon.                           Internal hemorrhoids were found during retroflexion                            with stigmata of prior banding.                            The exam was otherwise without abnormality. Complications:            No immediate complications. Estimated blood loss:                            Minimal. Estimated Blood Loss:     Estimated blood loss was minimal. Impression:               - Four 3 to 4 mm polyps in the ascending colon,                            removed with a cold snare. Resected and retrieved.                           - Two 3 to 6 mm polyps at the hepatic flexure,  removed with a cold snare. Resected and retrieved.                           - Four 3 to 4 mm polyps in the transverse colon,                            removed with a cold snare. Resected and retrieved.                           - Diverticulosis in the transverse colon, in the                            cecum and in the left colon.                           - Internal hemorrhoids.                           - The examination was otherwise normal. Recommendation:           - Patient has a contact number available for                            emergencies. The signs and symptoms of potential                            delayed complications were discussed with the                            patient. Return to normal activities tomorrow.                            Written discharge instructions were provided to the                            patient.                           - Resume previous diet.                           - Continue present medications.                           - Await pathology results. Remo Lipps P. Tamrah Victorino, MD 09/15/2019 2:12:00 PM This report has been signed electronically.

## 2019-09-19 ENCOUNTER — Telehealth: Payer: Self-pay | Admitting: *Deleted

## 2019-09-19 ENCOUNTER — Telehealth: Payer: Self-pay

## 2019-09-19 NOTE — Telephone Encounter (Signed)
No answer, left message to call back later today, B.Zion Ta RN. 

## 2019-09-19 NOTE — Telephone Encounter (Signed)
Patient returned your follow-up call after his procedure. He stated to be feeling fine.  

## 2019-09-19 NOTE — Telephone Encounter (Signed)
  Follow up Call-  Call back number 09/15/2019  Post procedure Call Back phone  # [phone]  Permission to leave phone message Yes  Some recent data might be hidden     Patient questions:  Do you have a fever, pain , or abdominal swelling? No. Pain Score  0 *  Have you tolerated food without any problems? Yes.    Have you been able to return to your normal activities? Yes.    Do you have any questions about your discharge instructions: Diet   No. Medications  No. Follow up visit  No.  Do you have questions or concerns about your Care? No.  Actions: * If pain score is 4 or above: No action needed, pain <4.  1. Have you developed a fever since your procedure? no  2.   Have you had an respiratory symptoms (SOB or cough) since your procedure? no  3.   Have you tested positive for COVID 19 since your procedure no  4.   Have you had any family members/close contacts diagnosed with the COVID 19 since your procedure?  no   If yes to any of these questions please route to Joylene John, RN and Erenest Rasher, RN

## 2019-09-21 ENCOUNTER — Ambulatory Visit: Payer: Medicare Other | Attending: Internal Medicine

## 2019-09-21 DIAGNOSIS — Z23 Encounter for immunization: Secondary | ICD-10-CM

## 2019-09-21 NOTE — Progress Notes (Signed)
   Covid-19 Vaccination Clinic  Name:  Anthony King    MRN: [id number] DOB: 1951-01-27  09/21/2019  Mr. [name] was observed post Covid-19 immunization for 15 minutes without incident. He was provided with Vaccine Information Sheet and instruction to access the V-Safe system.   Mr. [name] was instructed to call 911 with any severe reactions post vaccine: Marland Kitchen Difficulty breathing  . Swelling of face and throat  . A fast heartbeat  . A bad rash all over body  . Dizziness and weakness   Immunizations Administered    Name Date Dose VIS Date Route   Pfizer COVID-19 Vaccine 09/21/2019 10:04 AM 0.3 mL 06/09/2019 Intramuscular   Manufacturer: [place]   Lot: [id number]   [place]: [id number]

## 2019-09-22 DIAGNOSIS — R1032 Left lower quadrant pain: Secondary | ICD-10-CM | POA: Diagnosis not present

## 2019-09-22 DIAGNOSIS — R1031 Right lower quadrant pain: Secondary | ICD-10-CM | POA: Diagnosis not present

## 2019-09-28 IMAGING — CT CT ABD-PELV W/ CM
2 of 5 series · 16 of 46 positions shown, 18 images · IV contrast (APPLIED)
Comparison: Right upper quadrant abdominal ultrasound 03/18/2018

CLINICAL DATA: Left lower quadrant pain and bloody stools for 1-1.5
months.

EXAM:
CT ABDOMEN AND PELVIS WITH CONTRAST
TECHNIQUE: Multidetector CT imaging of the abdomen and pelvis was performed
using the standard protocol following bolus administration of
intravenous contrast.
CONTRAST:  100mL S5ERQY-NWW IOPAMIDOL (S5ERQY-NWW) INJECTION 61%

[Series 2: axial st · axial · 0.97mm/px · z∈[-557,-72]mm · 13 of 109 slices shown, 15 images]
[im 6/109  soft-tissue]
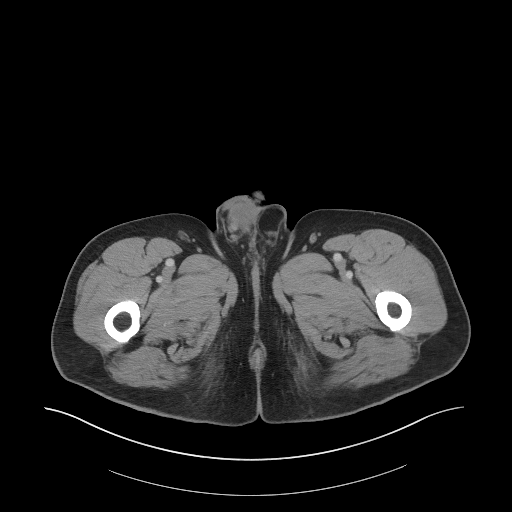
[im 6/109  bone]
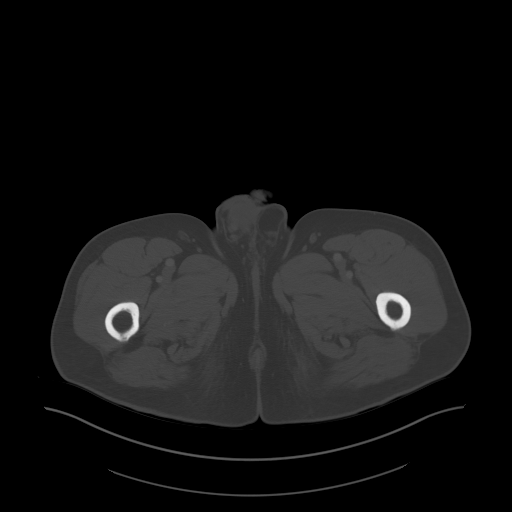
[im 18/109  soft-tissue]
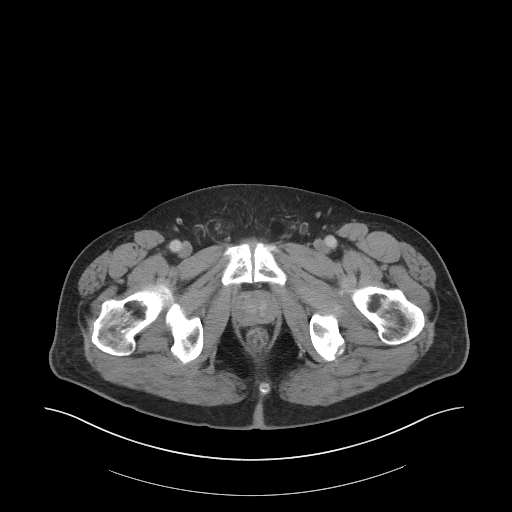
[im 23/109  soft-tissue]
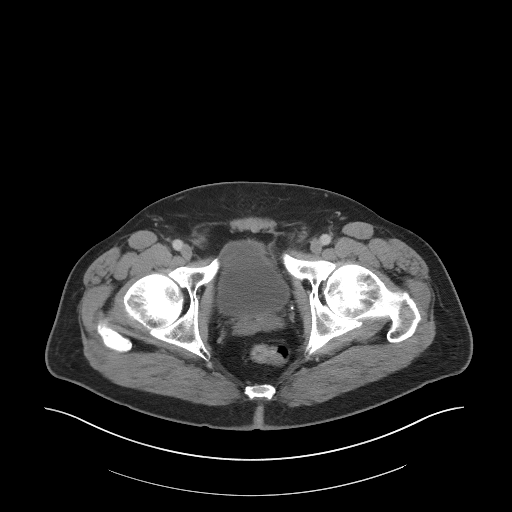
[im 29/109  soft-tissue]
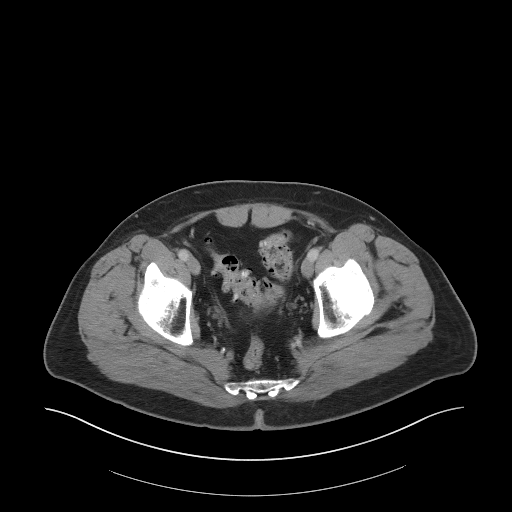
[im 40/109  soft-tissue]
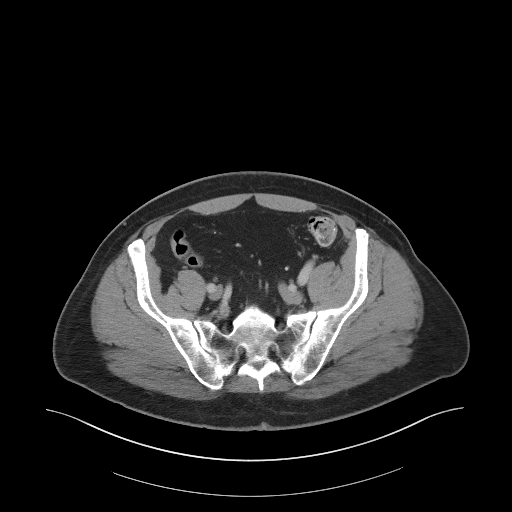
[im 46/109  soft-tissue]
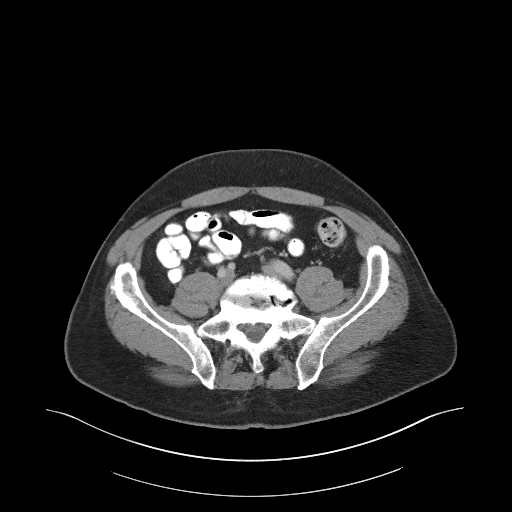
[im 57/109  soft-tissue]
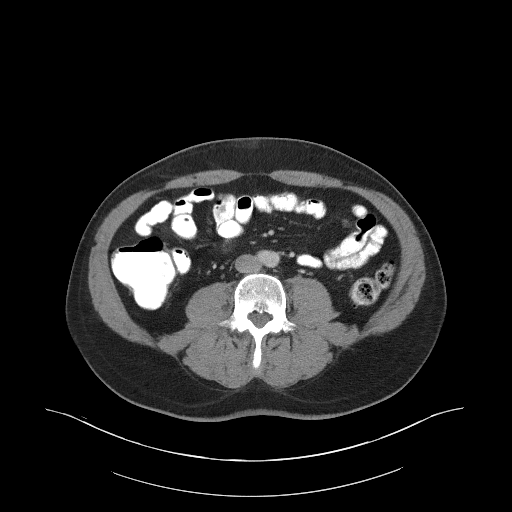
[im 63/109  soft-tissue]
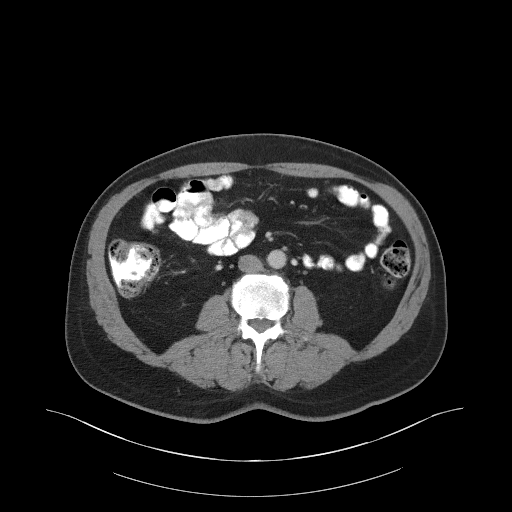
[im 69/109  soft-tissue]
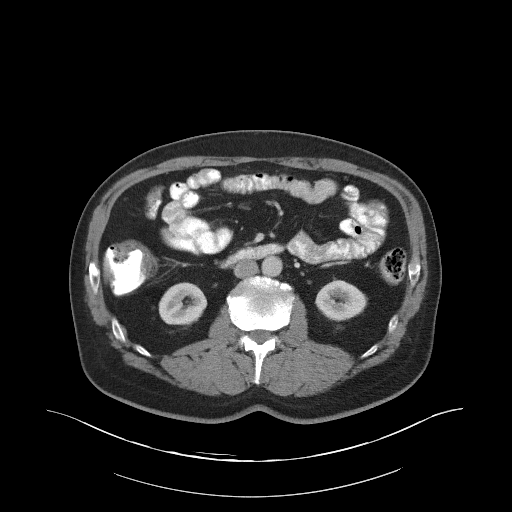
[im 69/109  bone]
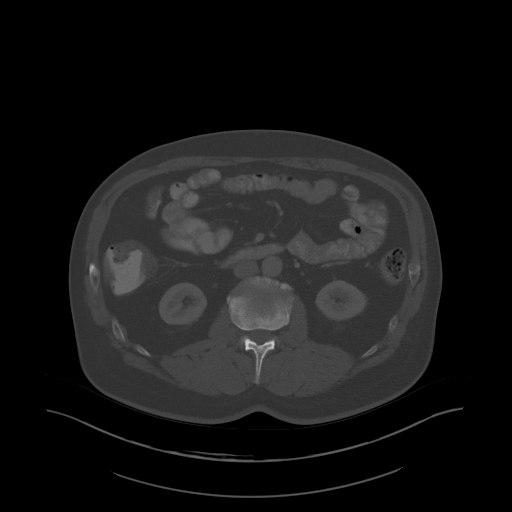
[im 80/109  soft-tissue]
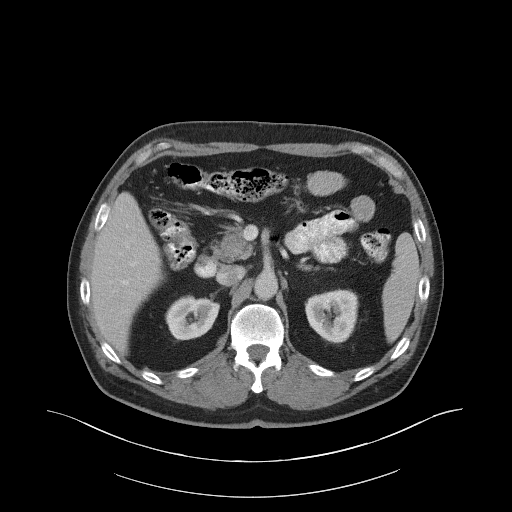
[im 86/109  soft-tissue]
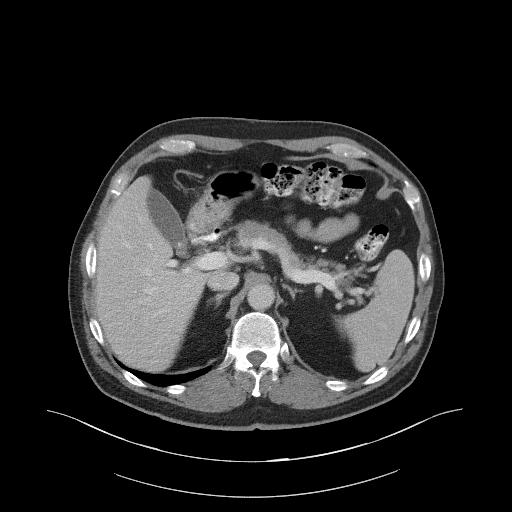
[im 91/109  soft-tissue]
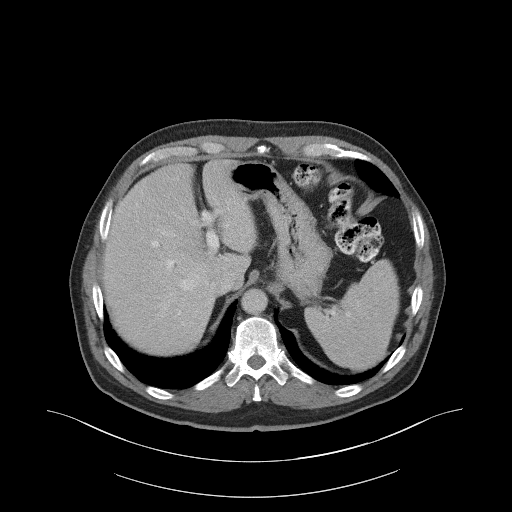
[im 103/109  soft-tissue]
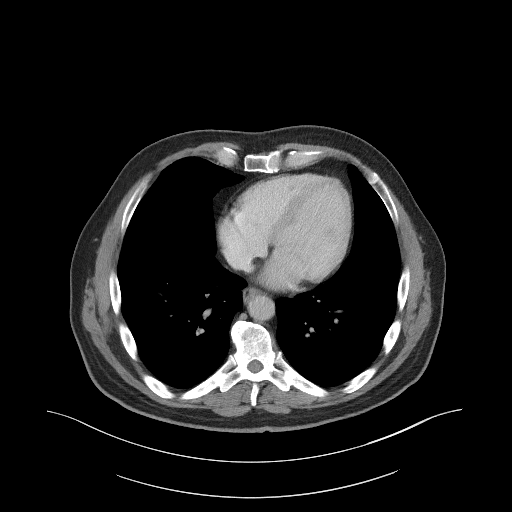

[Series 5: coronal st · coronal · 0.80mm/px · 3 of 102 slices shown]
[im 34/102  soft-tissue]
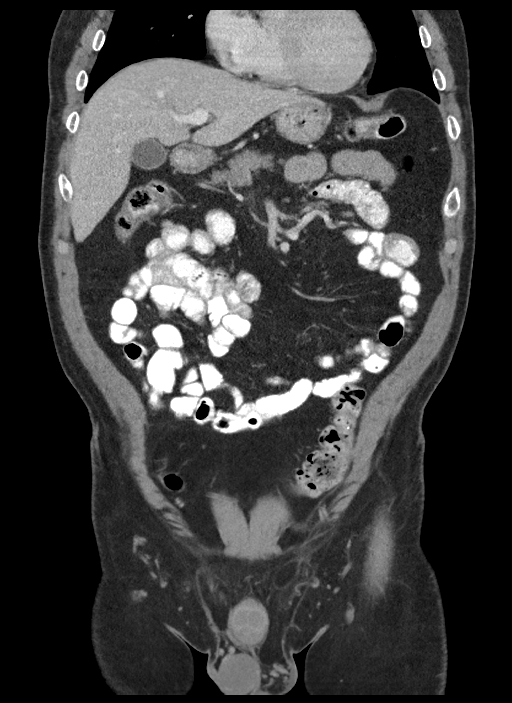
[im 45/102  soft-tissue]
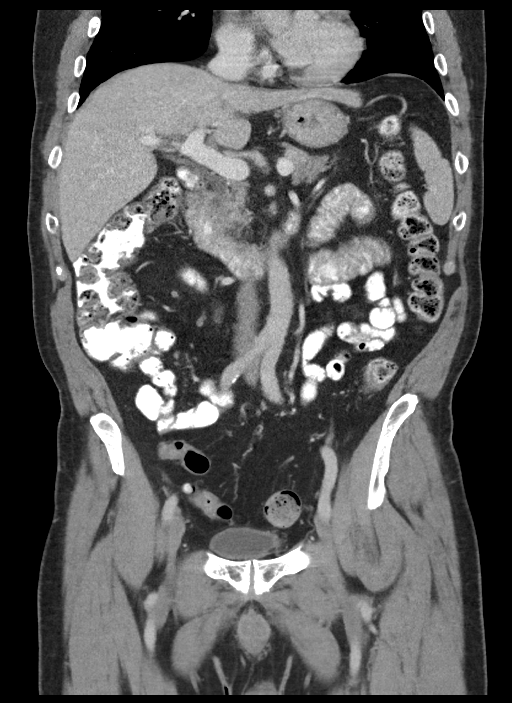
[im 57/102  soft-tissue]
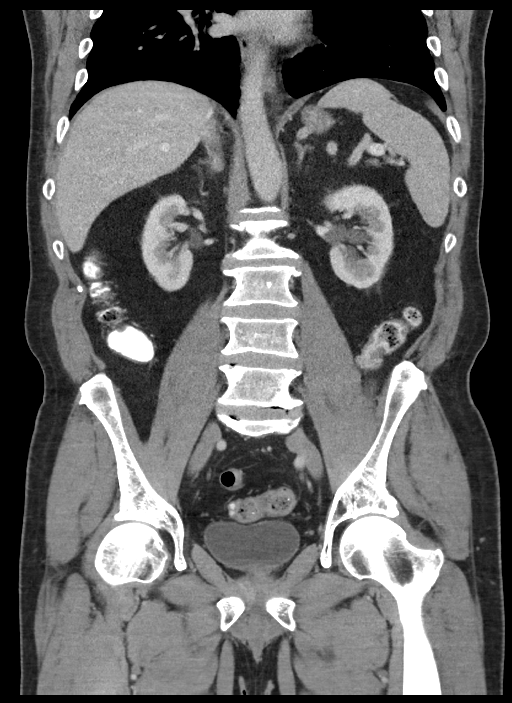

[16 of 46 positions shown; findings below may reference images not displayed]

FINDINGS: Lower chest: Minimal dependent atelectasis in the lung bases. No
pleural effusion.

Hepatobiliary: No focal liver abnormality is seen. A small calcified
stone is noted in the gallbladder without evidence of acute
inflammation or biliary dilatation.

Pancreas: Unremarkable.

Spleen: Unremarkable.

Adrenals/Urinary Tract: Unremarkable adrenal glands. Mild right
renal scarring. 3 mm calculus in the interpolar right kidney. No
hydronephrosis. Left renal parapelvic cysts. Unremarkable bladder.

Stomach/Bowel: The stomach is unremarkable. There is diverticulosis
of the sigmoid and to a lesser extent descending colon without
evidence of diverticulitis. There is no bowel obstruction. Prior
appendectomy.

Vascular/Lymphatic: Mild abdominal aortic atherosclerosis without
aneurysm. No enlarged lymph nodes.

Reproductive: Unremarkable prostate. Bilateral varicoceles.

Other: Bilateral fat containing inguinal hernias. No ascites or
pneumoperitoneum.

Musculoskeletal: No acute osseous abnormality or suspicious osseous
lesion.
IMPRESSION: 1. No acute abnormality identified in the abdomen or pelvis.
2. Colonic diverticulosis without evidence of diverticulitis.
3. Cholelithiasis.
4. Nonobstructing right nephrolithiasis.
5.  Aortic Atherosclerosis ([id number]).

## 2019-10-14 DIAGNOSIS — B342 Coronavirus infection, unspecified: Secondary | ICD-10-CM | POA: Diagnosis not present

## 2019-10-14 DIAGNOSIS — Z20828 Contact with and (suspected) exposure to other viral communicable diseases: Secondary | ICD-10-CM | POA: Diagnosis not present

## 2019-10-16 ENCOUNTER — Ambulatory Visit: Payer: Medicare Other

## 2019-10-17 ENCOUNTER — Encounter: Payer: Self-pay | Admitting: Family Medicine

## 2019-10-17 ENCOUNTER — Ambulatory Visit (INDEPENDENT_AMBULATORY_CARE_PROVIDER_SITE_OTHER): Payer: Medicare Other | Admitting: Family Medicine

## 2019-10-17 ENCOUNTER — Other Ambulatory Visit: Payer: Self-pay

## 2019-10-17 VITALS — Ht 70.0 in

## 2019-10-17 DIAGNOSIS — J40 Bronchitis, not specified as acute or chronic: Secondary | ICD-10-CM

## 2019-10-17 DIAGNOSIS — U071 COVID-19: Secondary | ICD-10-CM

## 2019-10-17 MED ORDER — PROMETHAZINE-DM 6.25-15 MG/5ML PO SYRP: 5.0000 mL | ORAL_SOLUTION | Freq: Four times a day (QID) | ORAL | 0 refills | Status: DC | PRN

## 2019-10-17 MED ORDER — AZITHROMYCIN 250 MG PO TABS: ORAL_TABLET | ORAL | 0 refills | Status: DC

## 2019-10-17 MED ORDER — PREDNISONE 10 MG PO TABS: ORAL_TABLET | ORAL | 0 refills | Status: DC

## 2019-10-17 NOTE — Progress Notes (Addendum)
Virtual Visit via telephone Note  I connected with Anthony King on 10/17/19 at  1:20 PM EDT by a  telemedicine application and verified that I am speaking with the correct person using two identifiers.  Location: Patient: home with wife  Provider: office    I discussed the limitations of evaluation and management by telemedicine and the availability of in person appointments. The patient expressed understanding and agreed to proceed.  History of Present Illness: Pt is home with his wife === they both tested positive for covid --they got the results yesterday Pt is sob and coughing ---  He is having trouble talking without coughing  He rechecked his O2 sat and it was 90% It has been between 94-96%    This was the first time it was 90% He has been running fevers and having body aches.       Observations/Objective: Today's Vitals   10/17/19 1328  SpO2: 94%  Height: 5\' 10"  (1.778 m)   Body mass index is 31.14 kg/m. Assessment and Plan: 1. Bronchitis due to COVID-19 virus Pt felt o2 sat 90% was wrong but he was sob on phone We will treat but pt was advised if the pulse ox is consistantly <95%  To go to ER Pt  and his wife verbalized understanding  - azithromycin (ZITHROMAX Z-PAK) 250 MG tablet; As directed  Dispense: 6 each; Refill: 0 - predniSONE (DELTASONE) 10 MG tablet; TAKE 3 TABLETS PO QD FOR 3 DAYS THEN TAKE 2 TABLETS PO QD FOR 3 DAYS THEN TAKE 1 TABLET PO QD FOR 3 DAYS THEN TAKE 1/2 TAB PO QD FOR 3 DAYS  Dispense: 20 tablet; Refill: 0 - promethazine-dextromethorphan (PROMETHAZINE-DM) 6.25-15 MG/5ML syrup; Take 5 mLs by mouth 4 (four) times daily as needed.  Dispense: 118 mL; Refill: 0  Follow Up Instructions:    I discussed the assessment and treatment plan with the patient. The patient was provided an opportunity to ask questions and all were answered. The patient agreed with the plan and demonstrated an understanding of the instructions.   The patient was advised to  call back or seek an in-person evaluation if the symptoms worsen or if the condition fails to improve as anticipated.  I provided 25 minutes of non-face-to-face time during this encounter.   Ann Held, DO

## 2019-10-18 ENCOUNTER — Emergency Department (HOSPITAL_BASED_OUTPATIENT_CLINIC_OR_DEPARTMENT_OTHER): Payer: Medicare Other

## 2019-10-18 ENCOUNTER — Encounter (HOSPITAL_BASED_OUTPATIENT_CLINIC_OR_DEPARTMENT_OTHER): Payer: Self-pay | Admitting: *Deleted

## 2019-10-18 ENCOUNTER — Other Ambulatory Visit: Payer: Self-pay

## 2019-10-18 ENCOUNTER — Inpatient Hospital Stay (HOSPITAL_BASED_OUTPATIENT_CLINIC_OR_DEPARTMENT_OTHER)
Admission: EM | Admit: 2019-10-18 | Discharge: 2019-10-25 | DRG: 177 | Disposition: A | Discharge status: Home / Self Care | Payer: Medicare Other | Attending: Internal Medicine | Admitting: Internal Medicine

## 2019-10-18 DIAGNOSIS — K219 Gastro-esophageal reflux disease without esophagitis: Secondary | ICD-10-CM | POA: Diagnosis present

## 2019-10-18 DIAGNOSIS — E785 Hyperlipidemia, unspecified: Secondary | ICD-10-CM | POA: Diagnosis present

## 2019-10-18 DIAGNOSIS — R0602 Shortness of breath: Secondary | ICD-10-CM | POA: Diagnosis not present

## 2019-10-18 DIAGNOSIS — D696 Thrombocytopenia, unspecified: Secondary | ICD-10-CM | POA: Diagnosis present

## 2019-10-18 DIAGNOSIS — Z803 Family history of malignant neoplasm of breast: Secondary | ICD-10-CM | POA: Diagnosis not present

## 2019-10-18 DIAGNOSIS — Z7952 Long term (current) use of systemic steroids: Secondary | ICD-10-CM | POA: Diagnosis not present

## 2019-10-18 DIAGNOSIS — Z79899 Other long term (current) drug therapy: Secondary | ICD-10-CM

## 2019-10-18 DIAGNOSIS — Z87891 Personal history of nicotine dependence: Secondary | ICD-10-CM

## 2019-10-18 DIAGNOSIS — Z87442 Personal history of urinary calculi: Secondary | ICD-10-CM | POA: Diagnosis not present

## 2019-10-18 DIAGNOSIS — Z886 Allergy status to analgesic agent status: Secondary | ICD-10-CM | POA: Diagnosis not present

## 2019-10-18 DIAGNOSIS — J9601 Acute respiratory failure with hypoxia: Secondary | ICD-10-CM | POA: Diagnosis present

## 2019-10-18 DIAGNOSIS — R509 Fever, unspecified: Secondary | ICD-10-CM | POA: Diagnosis not present

## 2019-10-18 DIAGNOSIS — N183 Chronic kidney disease, stage 3 unspecified: Secondary | ICD-10-CM | POA: Diagnosis present

## 2019-10-18 DIAGNOSIS — R0902 Hypoxemia: Secondary | ICD-10-CM

## 2019-10-18 DIAGNOSIS — J1282 Pneumonia due to coronavirus disease 2019: Secondary | ICD-10-CM | POA: Diagnosis present

## 2019-10-18 DIAGNOSIS — Z85828 Personal history of other malignant neoplasm of skin: Secondary | ICD-10-CM | POA: Diagnosis not present

## 2019-10-18 DIAGNOSIS — Z8261 Family history of arthritis: Secondary | ICD-10-CM | POA: Diagnosis not present

## 2019-10-18 DIAGNOSIS — Z8719 Personal history of other diseases of the digestive system: Secondary | ICD-10-CM | POA: Diagnosis not present

## 2019-10-18 DIAGNOSIS — Z800 Family history of malignant neoplasm of digestive organs: Secondary | ICD-10-CM

## 2019-10-18 DIAGNOSIS — U071 COVID-19: Secondary | ICD-10-CM | POA: Diagnosis not present

## 2019-10-18 DIAGNOSIS — Z8711 Personal history of peptic ulcer disease: Secondary | ICD-10-CM | POA: Diagnosis not present

## 2019-10-18 DIAGNOSIS — N1831 Chronic kidney disease, stage 3a: Secondary | ICD-10-CM | POA: Diagnosis present

## 2019-10-18 HISTORY — DX: Hypoxemia: R09.02

## 2019-10-18 LAB — PROCALCITONIN: Procalcitonin: 0.13 ng/mL

## 2019-10-18 LAB — COMPREHENSIVE METABOLIC PANEL
ALT: 22 U/L (ref 0–44)
AST: 30 U/L (ref 15–41)
Albumin: 3.5 g/dL (ref 3.5–5.0)
Alkaline Phosphatase: 64 U/L (ref 38–126)
Anion gap: 9 (ref 5–15)
BUN: 23 mg/dL (ref 8–23)
CO2: 21 mmol/L — ABNORMAL LOW (ref 22–32)
Calcium: 8.5 mg/dL — ABNORMAL LOW (ref 8.9–10.3)
Chloride: 103 mmol/L (ref 98–111)
Creatinine, Ser: 1.36 mg/dL — ABNORMAL HIGH (ref 0.61–1.24)
GFR calc Af Amer: >60 mL/min (ref >60)
GFR calc non Af Amer: 53 mL/min — ABNORMAL LOW (ref >60)
Glucose, Bld: 118 mg/dL — ABNORMAL HIGH (ref 70–99)
Potassium: 3.7 mmol/L (ref 3.5–5.1)
Sodium: 133 mmol/L — ABNORMAL LOW (ref 135–145)
Total Bilirubin: 1.5 mg/dL — ABNORMAL HIGH (ref 0.3–1.2)
Total Protein: 6.8 g/dL (ref 6.5–8.1)

## 2019-10-18 LAB — C-REACTIVE PROTEIN: CRP: 15.6 mg/dL — ABNORMAL HIGH (ref <1.0)

## 2019-10-18 LAB — CBC WITH DIFFERENTIAL/PLATELET
Abs Immature Granulocytes: 0.02 10*3/uL (ref 0.00–0.07)
Basophils Absolute: 0 10*3/uL (ref 0.0–0.1)
Basophils Relative: 0 %
Eosinophils Absolute: 0 10*3/uL (ref 0.0–0.5)
Eosinophils Relative: 0 %
HCT: 42 % (ref 39.0–52.0)
Hemoglobin: 13.5 g/dL (ref 13.0–17.0)
Immature Granulocytes: 0 %
Lymphocytes Relative: 7 %
Lymphs Abs: 0.5 10*3/uL — ABNORMAL LOW (ref 0.7–4.0)
MCH: 24.8 pg — ABNORMAL LOW (ref 26.0–34.0)
MCHC: 32.1 g/dL (ref 30.0–36.0)
MCV: 77.2 fL — ABNORMAL LOW (ref 80.0–100.0)
Monocytes Absolute: 0.7 10*3/uL (ref 0.1–1.0)
Monocytes Relative: 9 %
Neutro Abs: 6.5 10*3/uL (ref 1.7–7.7)
Neutrophils Relative %: 84 %
Platelets: 115 10*3/uL — ABNORMAL LOW (ref 150–400)
RBC: 5.44 MIL/uL (ref 4.22–5.81)
RDW: 14.9 % (ref 11.5–15.5)
Smear Review: NORMAL
WBC: 7.7 10*3/uL (ref 4.0–10.5)
nRBC: 0 % (ref 0.0–0.2)

## 2019-10-18 LAB — FERRITIN: Ferritin: 132 ng/mL (ref 24–336)

## 2019-10-18 LAB — FIBRINOGEN: Fibrinogen: 663 mg/dL — ABNORMAL HIGH (ref 210–475)

## 2019-10-18 LAB — D-DIMER, QUANTITATIVE: D-Dimer, Quant: 1.05 ug/mL-FEU — ABNORMAL HIGH (ref 0.00–0.50)

## 2019-10-18 LAB — TRIGLYCERIDES: Triglycerides: 111 mg/dL (ref <150)

## 2019-10-18 LAB — LACTIC ACID, PLASMA: Lactic Acid, Venous: 1.1 mmol/L (ref 0.5–1.9)

## 2019-10-18 LAB — LACTATE DEHYDROGENASE: LDH: 282 U/L — ABNORMAL HIGH (ref 98–192)

## 2019-10-18 MED ORDER — LORATADINE 10 MG PO TABS
10.0000 mg | ORAL_TABLET | Freq: Every day | ORAL | Status: DC
  Administered 2019-10-19 – 2019-10-25 (×7): 10 mg via ORAL
  Filled 2019-10-18 (×7): qty 1

## 2019-10-18 MED ORDER — DEXAMETHASONE SODIUM PHOSPHATE 10 MG/ML IJ SOLN
10.0000 mg | Freq: Once | INTRAMUSCULAR | Status: AC
  Administered 2019-10-18: 10 mg via INTRAVENOUS
  Filled 2019-10-18: qty 1

## 2019-10-18 MED ORDER — ACETAMINOPHEN 325 MG PO TABS
650.0000 mg | ORAL_TABLET | Freq: Four times a day (QID) | ORAL | Status: DC | PRN
  Administered 2019-10-19: 650 mg via ORAL
  Filled 2019-10-18: qty 2

## 2019-10-18 MED ORDER — IBUPROFEN 400 MG PO TABS
600.0000 mg | ORAL_TABLET | Freq: Once | ORAL | Status: AC
  Administered 2019-10-18: 600 mg via ORAL
  Filled 2019-10-18: qty 1

## 2019-10-18 MED ORDER — ONDANSETRON HCL 4 MG/2ML IJ SOLN: 4.0000 mg | Freq: Four times a day (QID) | INTRAMUSCULAR | Status: DC | PRN

## 2019-10-18 MED ORDER — SODIUM CHLORIDE 0.9 % IV SOLN
100.0000 mg | Freq: Once | INTRAVENOUS | Status: AC
  Administered 2019-10-18: 100 mg via INTRAVENOUS
  Filled 2019-10-18: qty 20

## 2019-10-18 MED ORDER — ENOXAPARIN SODIUM 40 MG/0.4ML ~~LOC~~ SOLN
40.0000 mg | SUBCUTANEOUS | Status: DC
  Administered 2019-10-18 – 2019-10-24 (×7): 40 mg via SUBCUTANEOUS
  Filled 2019-10-18 (×6): qty 0.4

## 2019-10-18 MED ORDER — VITAMIN B-12 1000 MCG PO TABS
1000.0000 ug | ORAL_TABLET | Freq: Every day | ORAL | Status: DC
  Administered 2019-10-19 – 2019-10-25 (×7): 1000 ug via ORAL
  Filled 2019-10-18 (×7): qty 1

## 2019-10-18 MED ORDER — DEXAMETHASONE 4 MG PO TABS
6.0000 mg | ORAL_TABLET | ORAL | Status: DC
  Administered 2019-10-19 – 2019-10-20 (×2): 6 mg via ORAL
  Filled 2019-10-18 (×2): qty 2

## 2019-10-18 MED ORDER — PANTOPRAZOLE SODIUM 40 MG PO TBEC
40.0000 mg | DELAYED_RELEASE_TABLET | Freq: Every day | ORAL | Status: DC
  Administered 2019-10-19 – 2019-10-25 (×7): 40 mg via ORAL
  Filled 2019-10-18 (×7): qty 1

## 2019-10-18 MED ORDER — GUAIFENESIN-DM 100-10 MG/5ML PO SYRP
10.0000 mL | ORAL_SOLUTION | ORAL | Status: DC | PRN
  Administered 2019-10-19 – 2019-10-21 (×3): 10 mL via ORAL
  Filled 2019-10-18 (×3): qty 10

## 2019-10-18 MED ORDER — ONDANSETRON HCL 4 MG PO TABS: 4.0000 mg | ORAL_TABLET | Freq: Four times a day (QID) | ORAL | Status: DC | PRN

## 2019-10-18 MED ORDER — SODIUM CHLORIDE 0.9 % IV SOLN
100.0000 mg | Freq: Every day | INTRAVENOUS | Status: AC
  Administered 2019-10-19 – 2019-10-22 (×4): 100 mg via INTRAVENOUS
  Filled 2019-10-18 (×4): qty 20

## 2019-10-18 NOTE — H&P (Signed)
History and Physical    Anthony King [id number] DOB: 1950/09/15 DOA: 10/18/2019  PCP: Ann Held, DO   Patient coming from: Home  I have personally briefly reviewed patient's old medical records in [place]  Chief Complaint: Shortness of breath  HPI: Anthony King is a 69 y.o. male with medical history significant of chronic any disease stage III, GERD and recent diagnosis of COVID-19 on 10/14/2019 presented with worsening shortness of breath.  Patient states that he was diagnosed with COVID-19 on 10/14/2019 and has been having nearly 1 week of cough, fever, body aches and not feeling well.  He has been monitoring his oxygen saturations at home.  This morning, it was in the low 90s and his breathing was worsening so he presented [facility] ED.  Patient denies any chest pain, nausea, vomiting, abdominal pain, diarrhea, dysuria, loss of consciousness or seizures or any rash.  His wife has also tested positive for Covid and is currently at home.  ED Course: He was found to be hypoxic requiring supplemental oxygen and chest x-ray showed subtle developing infiltrates.  Hospitalist service was called to evaluate the patient.  I asked the ED provider to give IV Decadron and remdesivir.  He was subsequently transferred to [facility].  Review of Systems: As per HPI otherwise all other systems were reviewed and were negative.  Past Medical History:  Diagnosis Date  . Arthritis 07-17-11   hands, hips, knees  . Basal cell carcinoma    nose  . Chronic kidney disease 07-17-11   past kidney stone x1  . Colon polyps    ADENOMATOUS AND HYPERPLASTIC POLYPS  . Diverticulosis   . Gastric ulcer   . GERD (gastroesophageal reflux disease)   . Hemorrhoids   . Hyperlipidemia   . Wears glasses     Past Surgical History:  Procedure Laterality Date  . APPENDECTOMY    . BASAL CELL CARCINOMA EXCISION     nose  . COLONOSCOPY    . fatty tumors  07-17-11   excised  x4- 1 -neck, 1 arm, 2 chest  . HEMORRHOID BANDING    . INGUINAL HERNIA REPAIR  07/23/2011   Procedure: LAPAROSCOPIC INGUINAL HERNIA;  Surgeon: Stark Klein, MD;  Location: [facility];  Service: General;  Laterality: Left;  . INGUINAL HERNIA REPAIR Bilateral 04/19/2019   Procedure: BILATERAL OPEN INGUINAL HERNIA REPAIR WITH MESH;  Surgeon: Erroll Luna, MD;  Location: [place];  Service: General;  Laterality: Bilateral;  . LEFT HEART CATH AND CORONARY ANGIOGRAPHY N/A 03/21/2018   Procedure: LEFT HEART CATH AND CORONARY ANGIOGRAPHY;  Surgeon: Jettie Booze, MD;  Location: [facility];  Service: Cardiovascular;  Laterality: N/A;  . MASS EXCISION N/A 08/29/2013   Procedure: EXCISION LEFT FLANK MASS/RIGHT EXCISION CHEST WALL MASS;  Surgeon: Stark Klein, MD;  Location: [place];  Service: General;  Laterality: N/A;  Left flank  . POLYPECTOMY     Social history  reports that he quit smoking about 28 years ago. His smoking use included cigarettes. He has never used smokeless tobacco. He reports that he does not drink alcohol or use drugs.  Allergies  Allergen Reactions  . Aspirin Other (See Comments)    REACTION: nauseated, drooling    Family History  Problem Relation Age of Onset  . Stomach cancer Father   . Colon cancer Father   . Stomach cancer Sister        Passed away  12/12  . Arthritis Mother   . Breast cancer Mother   . Arthritis Paternal Grandmother   . Arthritis Maternal Grandmother   . Colon cancer Brother   . Esophageal cancer Neg Hx   . Rectal cancer Neg Hx     Prior to Admission medications   Medication Sig Start Date End Date Taking? Authorizing Provider  azithromycin (ZITHROMAX Z-PAK) 250 MG tablet As directed 10/17/19  Yes Lowne Lyndal Pulley R, DO  lansoprazole (PREVACID) 30 MG capsule Take 30 mg by mouth daily at 12 noon.   Yes [provider]  loperamide (IMODIUM A-D) 2 MG tablet Take 1 tablet (2 mg total) by mouth  4 (four) times daily as needed for diarrhea or loose stools. 10/10/18  Yes Fredia Sorrow, MD  loratadine (CLARITIN) 10 MG tablet Take 10 mg by mouth daily.   Yes [provider]  predniSONE (DELTASONE) 10 MG tablet TAKE 3 TABLETS PO QD FOR 3 DAYS THEN TAKE 2 TABLETS PO QD FOR 3 DAYS THEN TAKE 1 TABLET PO QD FOR 3 DAYS THEN TAKE 1/2 TAB PO QD FOR 3 DAYS 10/17/19  Yes Roma Schanz R, DO  promethazine-dextromethorphan (PROMETHAZINE-DM) 6.25-15 MG/5ML syrup Take 5 mLs by mouth 4 (four) times daily as needed. 10/17/19  Yes Roma Schanz R, DO  Psyllium (METAMUCIL FREE & NATURAL) 43 % POWD Take 2 Units by mouth daily. Metamucil 2 teaspoons   Yes [provider]  vitamin B-12 (CYANOCOBALAMIN) 1000 MCG tablet Take 1,000 mcg by mouth daily.    Yes [provider]    Physical Exam: Vitals:   10/18/19 1200 10/18/19 1230 10/18/19 1300 10/18/19 1422  BP: 111/74 109/74 110/72 120/76  Pulse: 71 71 70   Resp: (!) 29 (!) 27 (!) 26 (!) 24  Temp:   97.9 F (36.6 C) 98 F (36.7 C)  TempSrc:    Oral  SpO2: 98% 99% 98% 97%  Weight:      Height:        Constitutional: NAD, calm, comfortable.  Currently requiring 2 L oxygen via nasal cannula. Vitals:   10/18/19 1200 10/18/19 1230 10/18/19 1300 10/18/19 1422  BP: 111/74 109/74 110/72 120/76  Pulse: 71 71 70   Resp: (!) 29 (!) 27 (!) 26 (!) 24  Temp:   97.9 F (36.6 C) 98 F (36.7 C)  TempSrc:    Oral  SpO2: 98% 99% 98% 97%  Weight:      Height:       Eyes: PERRL, lids and conjunctivae normal ENMT: Mucous membranes are moist. Posterior pharynx clear of any exudate or lesions. Neck: normal, supple, no masses, no thyromegaly Respiratory: bilateral decreased breath sounds at bases, no wheezing; some scattered crackles.  Tachypneic.  No accessory muscle use.  Cardiovascular: S1 S2 positive, rate controlled. No extremity edema. 2+ pedal pulses.  Abdomen: no tenderness, no masses palpated. No hepatosplenomegaly.  Bowel sounds positive.  Musculoskeletal: no clubbing / cyanosis. No joint deformity upper and lower extremities.  Skin: no rashes, lesions, ulcers. No induration Neurologic: CN 2-12 grossly intact. Moving extremities. No focal neurologic deficits.  Psychiatric: Normal judgment and insight. Alert and oriented x 3. Normal mood.    Labs on Admission: I have personally reviewed following labs and imaging studies  CBC: Recent Labs  Lab 10/18/19 0807  WBC 7.7  NEUTROABS 6.5  HGB 13.5  HCT 42.0  MCV 77.2*  PLT [id number]*   Basic Metabolic Panel: Recent Labs  Lab 10/18/19 0807  NA  133*  K 3.7  CL 103  CO2 21*  GLUCOSE 118*  BUN 23  CREATININE 1.36*  CALCIUM 8.5*   GFR: Estimated Creatinine Clearance: 58.9 mL/min (A) (by C-G formula based on SCr of 1.36 mg/dL (H)). Liver Function Tests: Recent Labs  Lab 10/18/19 0807  AST 30  ALT 22  ALKPHOS 64  BILITOT 1.5*  PROT 6.8  ALBUMIN 3.5   No results for input(s): LIPASE, AMYLASE in the last 168 hours. No results for input(s): AMMONIA in the last 168 hours. Coagulation Profile: No results for input(s): INR, PROTIME in the last 168 hours. Cardiac Enzymes: No results for input(s): CKTOTAL, CKMB, CKMBINDEX, TROPONINI in the last 168 hours. BNP (last 3 results) No results for input(s): PROBNP in the last 8760 hours. HbA1C: No results for input(s): HGBA1C in the last 72 hours. CBG: No results for input(s): GLUCAP in the last 168 hours. Lipid Profile: Recent Labs    10/18/19 0807  TRIG 111   Thyroid Function Tests: No results for input(s): TSH, T4TOTAL, FREET4, T3FREE, THYROIDAB in the last 72 hours. Anemia Panel: Recent Labs    10/18/19 0807  FERRITIN 132   Urine analysis:    Component Value Date/Time   COLORURINE YELLOW 10/10/2018 1144   APPEARANCEUR CLEAR 10/10/2018 1144   LABSPEC 1.020 10/10/2018 1144   PHURINE 6.0 10/10/2018 1144   GLUCOSEU NEGATIVE 10/10/2018 1144   HGBUR NEGATIVE 10/10/2018 1144    BILIRUBINUR SMALL (A) 10/10/2018 1144   BILIRUBINUR neg 06/11/2016 1701   KETONESUR NEGATIVE 10/10/2018 1144   PROTEINUR NEGATIVE 10/10/2018 1144   UROBILINOGEN 0.2 06/11/2016 1701   UROBILINOGEN 0.2 07/17/2011 0900   NITRITE NEGATIVE 10/10/2018 1144   LEUKOCYTESUR NEGATIVE 10/10/2018 1144    Radiological Exams on Admission: DG Chest Portable 1 View  Result Date: 10/18/2019 CLINICAL DATA:  New diagnosis of COVID-19. Fever, cough, and shortness of breath. EXAM: PORTABLE CHEST 1 VIEW COMPARISON:  Chest x-ray dated 10/10/2018 FINDINGS: There are faint bilateral pulmonary infiltrates in the periphery of both lungs. Heart size and vascularity are normal. No effusions. No significant bone abnormality. IMPRESSION: Faint bilateral pulmonary infiltrates in the periphery of both lungs. Electronically Signed   By: Lorriane Shire M.D.   On: 10/18/2019 08:32    EKG: Independently reviewed.  Normal sinus rhythm with no ST elevations or depressions.  Assessment/Plan  COVID-19 pneumonia Hypoxia -Patient tested positive as an outpatient for COVID-19 on 10/14/2019. -Currently requiring 2 L oxygen via nasal cannula with chest x-ray showing faint bilateral pulmonary infiltrates. -Continue Decadron 6 mg IV daily along with IV remdesivir. -Monitor inflammatory markers daily. -Wean off oxygen as able.  Incentive spirometry  Thrombocytopenia -Questionable cause.  No signs of bleeding.  Monitor  Chronic kidney disease stage III -Creatinine stable.  Monitor  GERD -Continue PPI  DVT prophylaxis: Lovenox Code Status: Full Family Communication: Spoke to patient at bedside Disposition Plan: Will need to be inpatient for treatment of Covid pneumonia with IV steroids and remdesivir. Consults called: None Admission status: Inpatient/telemetry  Severity of Illness: The appropriate patient status for this patient is INPATIENT. Inpatient status is judged to be reasonable and necessary in order to provide the  required intensity of service to ensure the patient's safety. The patient's presenting symptoms, physical exam findings, and initial radiographic and laboratory data in the context of their chronic comorbidities is felt to place them at high risk for further clinical deterioration. Furthermore, it is not anticipated that the patient will be medically stable for discharge from  the hospital within 2 midnights of admission. The following factors support the patient status of inpatient.   " The patient's presenting symptoms include shortness of breath/fever. " The worrisome physical exam findings include tachypnea with scattered crackles. " The initial radiographic and laboratory data are worrisome because of elevated inflammatory markers and bilateral pulmonary infiltrates. " The chronic co-morbidities include chronic kidney disease stage III.   * I certify that at the point of admission it is my clinical judgment that the patient will require inpatient hospital care spanning beyond 2 midnights from the point of admission due to high intensity of service, high risk for further deterioration and high frequency of surveillance required.Aline August MD [facility]  10/18/2019, 4:18 PM

## 2019-10-18 NOTE — TOC Progression Note (Signed)
Transition of Care [facility]) - Progression Note    Patient Details  Name: Anthony King MRN: [id number] Date of Birth: August 09, 1950  Transition of Care [facility]) CM/SW Contact  Purcell Mouton, RN Phone Number: 10/18/2019, 3:46 PM  Clinical Narrative:    Plan to discharge home when stable. TOC will follow for home health/DME needs.   Expected Discharge Plan: Home/Self Care Barriers to Discharge: No Barriers Identified  Expected Discharge Plan and Services Expected Discharge Plan: Home/Self Care       Living arrangements for the past 2 months: Single Family Home                                       Social Determinants of Health (SDOH) Interventions    Readmission Risk Interventions No flowsheet data found.

## 2019-10-18 NOTE — ED Triage Notes (Signed)
Feeling bad x 4-5 days ago  C/o shortness of breath, body aches, cough, fever

## 2019-10-18 NOTE — ED Notes (Signed)
ED Provider at bedside discussing test results and plan of care for admission. 

## 2019-10-18 NOTE — ED Provider Notes (Addendum)
[facility]ERGENC[facility] DEPARTMENT Provider Note   CSN: [id number] Arrival date & time: 10/18/19  [id number]     History Chief Complaint  Patient presents with  . Shortness of Breath    Anthony King is a 69 y.o. male.  Patient is a 68 year old male with past medical history of chronic renal insufficiency, hyperlipidemia, GERD.  He presents today for evaluation of Covid-like symptoms.  Patient reports nearly 1 week of cough, fever, body aches, and not feeling well.  He had 2+ Covid tests, one at a testing facility and the other by his primary doctor.  Patient has been monitoring his oxygen saturations at home.  This morning it was in the low 90s and his breathing is worsening.  He presents for evaluation of this.  The history is provided by the patient.  Shortness of Breath Severity:  Moderate Onset quality:  Gradual Duration:  1 week Timing:  Constant Progression:  Worsening Chronicity:  New Context: activity and URI   Relieved by:  Nothing Worsened by:  Nothing Ineffective treatments:  None tried Associated symptoms: cough and fever   Associated symptoms: no sputum production        Past Medical History:  Diagnosis Date  . Arthritis 07-17-11   hands, hips, knees  . Basal cell carcinoma    nose  . Chronic kidney disease 07-17-11   past kidney stone x1  . Colon polyps    ADENOMATOUS AND HYPERPLASTIC POLYPS  . Diverticulosis   . Gastric ulcer   . GERD (gastroesophageal reflux disease)   . Hemorrhoids   . Hyperlipidemia   . Wears glasses     Patient Active Problem List   Diagnosis Date Noted  . Left lower quadrant abdominal pain 05/31/2018  . CRF (chronic renal failure), stage 3 (moderate) 04/18/2018  . CAD (coronary artery disease), native coronary artery 03/19/2018  . Aortic atherosclerosis ([place]) 03/19/2018  . Prediabetes 03/19/2018  . Unstable angina ([place]) 03/19/2018  . Chest pain 03/18/2018  . CKD (chronic kidney disease), stage III 03/18/2018    . Hyperlipidemia 10/04/2017  . Left flank mass, 3 cm subfascial 08/21/2013  . Mass of chest wall, right, 1 cm, subcutaneous. 08/21/2013  . Esophageal reflux 10/28/2011  . Personal history of colonic polyps 10/28/2011    Past Surgical History:  Procedure Laterality Date  . APPENDECTOMY    . BASAL CELL CARCINOMA EXCISION     nose  . COLONOSCOPY    . fatty tumors  07-17-11   excised x4- 1 -neck, 1 arm, 2 chest  . HEMORRHOID BANDING    . INGUINAL HERNIA REPAIR  07/23/2011   Procedure: LAPAROSCOPIC INGUINAL HERNIA;  Surgeon: Stark Klein, MD;  Location: [facility];  Service: General;  Laterality: Left;  . INGUINAL HERNIA REPAIR Bilateral 04/19/2019   Procedure: BILATERAL OPEN INGUINAL HERNIA REPAIR WITH MESH;  Surgeon: Erroll Luna, MD;  Location: [facility];  Service: General;  Laterality: Bilateral;  . LEFT HEART CATH AND CORONARY ANGIOGRAPHY N/A 03/21/2018   Procedure: LEFT HEART CATH AND CORONARY ANGIOGRAPHY;  Surgeon: Jettie Booze, MD;  Location: [facility];  Service: Cardiovascular;  Laterality: N/A;  . MASS EXCISION N/A 08/29/2013   Procedure: EXCISION LEFT FLANK MASS/RIGHT EXCISION CHEST WALL MASS;  Surgeon: Stark Klein, MD;  Location: [facility];  Service: General;  Laterality: N/A;  Left flank  . POLYPECTOMY         Family History  Problem Relation Age of Onset  . Stomach  cancer Father   . Colon cancer Father   . Stomach cancer Sister        Passed away 07/09/2023  . Arthritis Mother   . Breast cancer Mother   . Arthritis Paternal Grandmother   . Arthritis Maternal Grandmother   . Colon cancer Brother   . Esophageal cancer Neg Hx   . Rectal cancer Neg Hx     Social History   Tobacco Use  . Smoking status: Former Smoker    Types: Cigarettes    Quit date: 07/02/1991    Years since quitting: 28.3  . Smokeless tobacco: Never Used  Substance Use Topics  . Alcohol use: No  . Drug use: No    Home Medications Prior to  Admission medications   Medication Sig Start Date End Date Taking? Authorizing Provider  azithromycin (ZITHROMAX Z-PAK) 250 MG tablet As directed 10/17/19   Carollee Herter, Alferd Apa, DO  lansoprazole (PREVACID) 30 MG capsule Take 30 mg by mouth daily at 12 noon.    [provider]  loperamide (IMODIUM A-D) 2 MG tablet Take 1 tablet (2 mg total) by mouth 4 (four) times daily as needed for diarrhea or loose stools. 10/10/18   Fredia Sorrow, MD  predniSONE (DELTASONE) 10 MG tablet TAKE 3 TABLETS PO QD FOR 3 DAYS THEN TAKE 2 TABLETS PO QD FOR 3 DAYS THEN TAKE 1 TABLET PO QD FOR 3 DAYS THEN TAKE 1/2 TAB PO QD FOR 3 DAYS 10/17/19   Ann Held, DO  promethazine-dextromethorphan (PROMETHAZINE-DM) 6.25-15 MG/5ML syrup Take 5 mLs by mouth 4 (four) times daily as needed. 10/17/19   Carollee Herter, Alferd Apa, DO  Psyllium (METAMUCIL FREE & NATURAL) 43 % POWD Take 2 Units by mouth daily. Metamucil 2 teaspoons    [provider]  vitamin B-12 (CYANOCOBALAMIN) 1000 MCG tablet Take 1,000 mcg by mouth daily.     [provider]    Allergies    Aspirin  Review of Systems   Review of Systems  Constitutional: Positive for fever.  Respiratory: Positive for cough and shortness of breath. Negative for sputum production.   All other systems reviewed and are negative.   Physical Exam Updated Vital Signs BP 140/78 (BP Location: Right Arm)   Pulse 85   Temp (!) 101 F (38.3 C) (Oral)   Resp (!) 22   Ht 5\' 10"  (1.778 m)   Wt 90.7 kg   SpO2 (!) 89% Comment: Ptplaced on 2 L 02./ sats 93% on 2L per RT  BMI 28.70 kg/m   Physical Exam Vitals and nursing note reviewed.  Constitutional:      General: He is not in acute distress.    Appearance: He is well-developed. He is not diaphoretic.  HENT:     Head: Normocephalic and atraumatic.  Cardiovascular:     Rate and Rhythm: Normal rate and regular rhythm.     Heart sounds: No murmur. No friction rub.  Pulmonary:     Effort:  Pulmonary effort is normal. No respiratory distress.     Breath sounds: Normal breath sounds. No wheezing or rales.  Abdominal:     General: Bowel sounds are normal. There is no distension.     Palpations: Abdomen is soft.     Tenderness: There is no abdominal tenderness.  Musculoskeletal:        General: Normal range of motion.     Cervical back: Normal range of motion and neck supple.  Skin:    General:  Skin is warm and dry.  Neurological:     Mental Status: He is alert and oriented to person, place, and time.     Coordination: Coordination normal.     ED Results / Procedures / Treatments   Labs (all labs ordered are listed, but only abnormal results are displayed) Labs Reviewed  CULTURE, BLOOD (ROUTINE X 2)  CULTURE, BLOOD (ROUTINE X 2)  LACTIC ACID, PLASMA  LACTIC ACID, PLASMA  CBC WITH DIFFERENTIAL/PLATELET  COMPREHENSIVE METABOLIC PANEL  D-DIMER, QUANTITATIVE (NOT AT [facility])  PROCALCITONIN  LACTATE DEHYDROGENASE  FERRITIN  TRIGLYCERIDES  FIBRINOGEN  C-REACTIVE PROTEIN    EKG EKG Interpretation  Date/Time:  Wednesday October 18 2019 08:08:16 EDT Ventricular Rate:  85 PR Interval:    QRS Duration: 114 QT Interval:  353 QTC Calculation: 420 R Axis:   63 Text Interpretation: Sinus rhythm Incomplete right bundle branch block No significant change since 10/10/2018 Confirmed by Veryl Speak [phone]) on 10/18/2019 8:20:31 AM        Radiology No results found.  Procedures Procedures (including critical care time)  Medications Ordered in ED Medications - No data to display  ED Course  I have reviewed the triage vital signs and the nursing notes.  Pertinent labs & imaging results that were available during my care of the patient were reviewed by me and considered in my medical decision making (see chart for details).    MDM Rules/Calculators/A&P  Patient presenting here with complaints of shortness of breath, fatigue, and body aches.  Patient diagnosed  several days ago with COVID-19.  Patient is hypoxic here with oxygen saturations in the upper 80s.  He is also tachypneic, but in no respiratory distress at this time.  Patient's chest x-ray shows subtle developing infiltrates and laboratory studies essentially unremarkable.  Patient does have an oxygen requirement of 2 L nasal cannula to maintain saturations in the mid to upper 90s.  Care was discussed with Dr. Starla Link at [facility] who agrees to accept the patient in transfer.  Steroids and remdesivir will be initiated.  CRITICAL CARE Performed by: Veryl Speak Total critical care time: 35 minutes Critical care time was exclusive of separately billable procedures and treating other patients. Critical care was necessary to treat or prevent imminent or life-threatening deterioration. Critical care was time spent personally by me on the following activities: development of treatment plan with patient and/or surrogate as well as nursing, discussions with consultants, evaluation of patient's response to treatment, examination of patient, obtaining history from patient or surrogate, ordering and performing treatments and interventions, ordering and review of laboratory studies, ordering and review of radiographic studies, pulse oximetry and re-evaluation of patient's condition.   Anthony King was evaluated in Emergency Department on 10/18/2019 for the symptoms described in the history of present illness. He was evaluated in the context of the global COVID-19 pandemic, which necessitated consideration that the patient might be at risk for infection with the SARS-CoV-2 virus that causes COVID-19. Institutional protocols and algorithms that pertain to the evaluation of patients at risk for COVID-19 are in a state of rapid change based on information released by regulatory bodies including the CDC and federal and state organizations. These policies and algorithms were followed during the patient's care in  the ED.   Final Clinical Impression(s) / ED Diagnoses Final diagnoses:  None    Rx / DC Orders ED Discharge Orders    None       Veryl Speak, MD 10/18/19 1001  Veryl Speak, MD 10/18/19 1041

## 2019-10-19 LAB — CBC WITH DIFFERENTIAL/PLATELET
Abs Immature Granulocytes: 0.03 10*3/uL (ref 0.00–0.07)
Basophils Absolute: 0 10*3/uL (ref 0.0–0.1)
Basophils Relative: 0 %
Eosinophils Absolute: 0 10*3/uL (ref 0.0–0.5)
Eosinophils Relative: 0 %
HCT: 45 % (ref 39.0–52.0)
Hemoglobin: 14 g/dL (ref 13.0–17.0)
Immature Granulocytes: 0 %
Lymphocytes Relative: 9 %
Lymphs Abs: 0.7 10*3/uL (ref 0.7–4.0)
MCH: 24.7 pg — ABNORMAL LOW (ref 26.0–34.0)
MCHC: 31.1 g/dL (ref 30.0–36.0)
MCV: 79.4 fL — ABNORMAL LOW (ref 80.0–100.0)
Monocytes Absolute: 0.4 10*3/uL (ref 0.1–1.0)
Monocytes Relative: 6 %
Neutro Abs: 6.5 10*3/uL (ref 1.7–7.7)
Neutrophils Relative %: 85 %
Platelets: 136 10*3/uL — ABNORMAL LOW (ref 150–400)
RBC: 5.67 MIL/uL (ref 4.22–5.81)
RDW: 14.9 % (ref 11.5–15.5)
WBC: 7.7 10*3/uL (ref 4.0–10.5)
nRBC: 0 % (ref 0.0–0.2)

## 2019-10-19 LAB — COMPREHENSIVE METABOLIC PANEL
ALT: 27 U/L (ref 0–44)
AST: 34 U/L (ref 15–41)
Albumin: 3.4 g/dL — ABNORMAL LOW (ref 3.5–5.0)
Alkaline Phosphatase: 69 U/L (ref 38–126)
Anion gap: 10 (ref 5–15)
BUN: 30 mg/dL — ABNORMAL HIGH (ref 8–23)
CO2: 23 mmol/L (ref 22–32)
Calcium: 8.9 mg/dL (ref 8.9–10.3)
Chloride: 103 mmol/L (ref 98–111)
Creatinine, Ser: 1.37 mg/dL — ABNORMAL HIGH (ref 0.61–1.24)
GFR calc Af Amer: >60 mL/min (ref >60)
GFR calc non Af Amer: 53 mL/min — ABNORMAL LOW (ref >60)
Glucose, Bld: 141 mg/dL — ABNORMAL HIGH (ref 70–99)
Potassium: 3.9 mmol/L (ref 3.5–5.1)
Sodium: 136 mmol/L (ref 135–145)
Total Bilirubin: 0.9 mg/dL (ref 0.3–1.2)
Total Protein: 7 g/dL (ref 6.5–8.1)

## 2019-10-19 LAB — PROCALCITONIN: Procalcitonin: 0.25 ng/mL

## 2019-10-19 LAB — LACTATE DEHYDROGENASE: LDH: 291 U/L — ABNORMAL HIGH (ref 98–192)

## 2019-10-19 LAB — D-DIMER, QUANTITATIVE: D-Dimer, Quant: 0.82 ug/mL-FEU — ABNORMAL HIGH (ref 0.00–0.50)

## 2019-10-19 LAB — FERRITIN: Ferritin: 163 ng/mL (ref 24–336)

## 2019-10-19 LAB — MAGNESIUM: Magnesium: 2.3 mg/dL (ref 1.7–2.4)

## 2019-10-19 LAB — C-REACTIVE PROTEIN: CRP: 19.4 mg/dL — ABNORMAL HIGH (ref <1.0)

## 2019-10-19 LAB — HIV ANTIBODY (ROUTINE TESTING W REFLEX): HIV Screen 4th Generation wRfx: NONREACTIVE

## 2019-10-19 MED ORDER — ADULT MULTIVITAMIN W/MINERALS CH
1.0000 | ORAL_TABLET | Freq: Every day | ORAL | Status: DC
  Administered 2019-10-19 – 2019-10-25 (×8): 1 via ORAL
  Filled 2019-10-19 (×8): qty 1

## 2019-10-19 MED ORDER — HYDROCOD POLST-CPM POLST ER 10-8 MG/5ML PO SUER
5.0000 mL | Freq: Two times a day (BID) | ORAL | Status: DC | PRN
  Administered 2019-10-19 – 2019-10-22 (×5): 5 mL via ORAL
  Filled 2019-10-19 (×5): qty 5

## 2019-10-19 NOTE — Progress Notes (Signed)
PROGRESS NOTE    Anthony King  [id number] DOB: [birth year] DOA: 10/18/2019 PCP: Ann Held, DO      Brief Narrative:  Anthony King is a 69 y.o. M with CKD IIIa, GERD who presented with 1 week progressive SOB and hypoxia to 90% at home.  In the ER, CXR showed bilateral infiltrates and patient was hypoxic.              Assessment & Plan:  Coronavirus pneumonitis with acute hypoxic respiratory failure Presented with hypoxia, SOB and cough in the setting of the ongoing 2020-2021 COVID-19 pandemic.  Desaturates to 82% with ambulation on room air. -Continue remdesivir day 2 of 5 -Continue steroids day 2 -Continue VTE PPx with Lovenox  -Flutter valve, turn, cough, incentive spirometry q2hrs while awake -Nutrition consult    GERD -Continue pantoprazole  CKD IIIa Cr stable relative to baseline     Disposition: Status is: Inpatient  Remains inpatient appropriate because:Remains on oxygen requiring IV remdesivir   Dispo: The patient is from: Home              Anticipated d/c is to: Home              Anticipated d/c date is: 3 days              Patient currently is not medically stable to d/c.              DVT prophylaxis: Lovenox Code Status: Code Family Communication: We will call wife by phone MDM: The below labs and imaging reports were reviewed and summarized above.  Medication management as above.     Procedures:     Antimicrobials:   Remdesivir only  Culture data:   4/21 blood culture x2-no growth to date      Subjective: Patient is feeling more tired today than yesterday.  Patient had no respiratory distress, chest pain, sputum, hemoptysis, confusion, fever.  No vomiting.      Objective: Vitals:   10/18/19 2207 10/18/19 2300 10/19/19 0646 10/19/19 1352  BP:   120/70 121/70  Pulse:   60 61  Resp: (!) 27 (!) 25 20 18   Temp:   97.7 F (36.5 C) 98.1 F (36.7 C)  TempSrc:   Oral Oral  SpO2:    91% 95%  Weight:      Height:       No intake or output data in the 24 hours ending 10/19/19 1417 Filed Weights   10/18/19 0730  Weight: 90.7 kg    Examination: General appearance: Elderly adult male, alert and in no acute distress.  In bed, appears tired. HEENT: Anicteric, conjunctiva pink, lids and lashes normal. No nasal deformity, discharge, epistaxis.  Lips moist, dentition in good repair, oropharynx moist, no oral lesions, hearing normal.   Skin: Warm and dry.  No jaundice.  No suspicious rashes or lesions. Cardiac: RRR, nl S1-S2, no murmurs appreciated.  Capillary refill is brisk.  JVP not visible.  No LE edema.  Radial pulses 2+ and symmetric. Respiratory: Normal respiratory rate and rhythm.  CTAB without rales or wheezes.  Diminished throughout. Abdomen: Abdomen soft.  No TTP or guarding. No ascites, distension, hepatosplenomegaly.   MSK: No deformities or effusions.  Bulk and tone. Neuro: Awake and alert.  EOMI, moves all extremities. Speech fluent.    Psych: Sensorium intact and responding to questions, attention normal. Affect normal.  Judgment and insight appear normal.  Data Reviewed: I have personally reviewed following labs and imaging studies:  CBC: Recent Labs  Lab 10/18/19 0807 10/19/19 0138  WBC 7.7 7.7  NEUTROABS 6.5 6.5  HGB 13.5 14.0  HCT 42.0 45.0  MCV 77.2* 79.4*  PLT 115* [id number]*   Basic Metabolic Panel: Recent Labs  Lab 10/18/19 0807 10/19/19 0138  NA 133* 136  K 3.7 3.9  CL 103 103  CO2 21* 23  GLUCOSE 118* 141*  BUN 23 30*  CREATININE 1.36* 1.37*  CALCIUM 8.5* 8.9  MG  --  2.3   GFR: Estimated Creatinine Clearance: 58.5 mL/min (A) (by C-G formula based on SCr of 1.37 mg/dL (H)). Liver Function Tests: Recent Labs  Lab 10/18/19 0807 10/19/19 0138  AST 30 34  ALT 22 27  ALKPHOS 64 69  BILITOT 1.5* 0.9  PROT 6.8 7.0  ALBUMIN 3.5 3.4*   No results for input(s): LIPASE, AMYLASE in the last 168 hours. No results for  input(s): AMMONIA in the last 168 hours. Coagulation Profile: No results for input(s): INR, PROTIME in the last 168 hours. Cardiac Enzymes: No results for input(s): CKTOTAL, CKMB, CKMBINDEX, TROPONINI in the last 168 hours. BNP (last 3 results) No results for input(s): PROBNP in the last 8760 hours. HbA1C: No results for input(s): HGBA1C in the last 72 hours. CBG: No results for input(s): GLUCAP in the last 168 hours. Lipid Profile: Recent Labs    10/18/19 0807  TRIG 111   Thyroid Function Tests: No results for input(s): TSH, T4TOTAL, FREET4, T3FREE, THYROIDAB in the last 72 hours. Anemia Panel: Recent Labs    10/18/19 0807 10/19/19 0138  FERRITIN 132 163   Urine analysis:    Component Value Date/Time   COLORURINE YELLOW 10/10/2018 1144   APPEARANCEUR CLEAR 10/10/2018 1144   LABSPEC 1.020 10/10/2018 1144   PHURINE 6.0 10/10/2018 1144   GLUCOSEU NEGATIVE 10/10/2018 1144   HGBUR NEGATIVE 10/10/2018 1144   BILIRUBINUR SMALL (A) 10/10/2018 1144   BILIRUBINUR neg 06/11/2016 1701   KETONESUR NEGATIVE 10/10/2018 1144   PROTEINUR NEGATIVE 10/10/2018 1144   UROBILINOGEN 0.2 06/11/2016 1701   UROBILINOGEN 0.2 07/17/2011 0900   NITRITE NEGATIVE 10/10/2018 1144   LEUKOCYTESUR NEGATIVE 10/10/2018 1144   Sepsis Labs: @LABRCNTIP (procalcitonin:4,lacticacidven:4)  ) Recent Results (from the past 240 hour(s))  Blood Culture (routine x 2)     Status: None (Preliminary result)   Collection Time: 10/18/19  8:20 AM   Specimen: Left Antecubital; Blood  Result Value Ref Range Status   Specimen Description   Final    LEFT ANTECUBITAL Performed at [facility], [address] [place], [place] [address]    Special Requests   Final    BOTTLES DRAWN AEROBIC AND ANAEROBIC Blood Culture adequate volume Performed at [facility], [address] [place], [place] [address]    Culture   Final    NO GROWTH < 24 HOURS Performed at [facility], [place] [address], [place], [place] [address]    Report Status PENDING  Incomplete  Blood Culture (routine x 2)     Status: None (Preliminary result)   Collection Time: 10/18/19  8:33 AM   Specimen: Right Antecubital; Blood  Result Value Ref Range Status   Specimen Description   Final    RIGHT ANTECUBITAL Performed at [facility], [address] [place], [place] [address]    Special Requests   Final    BOTTLES DRAWN AEROBIC AND ANAEROBIC  Blood Culture adequate volume Performed at [facility], [address] [place], [place] [address]    Culture   Final    NO GROWTH < 24 HOURS Performed at [facility], [place] [address] [place], [place] [address]    Report Status PENDING  Incomplete         Radiology Studies: DG Chest Portable 1 View  Result Date: 10/18/2019 CLINICAL DATA:  New diagnosis of COVID-19. Fever, cough, and shortness of breath. EXAM: PORTABLE CHEST 1 VIEW COMPARISON:  Chest x-ray dated 10/10/2018 FINDINGS: There are faint bilateral pulmonary infiltrates in the periphery of both lungs. Heart size and vascularity are normal. No effusions. No significant bone abnormality. IMPRESSION: Faint bilateral pulmonary infiltrates in the periphery of both lungs. Electronically Signed   By: Lorriane Shire M.D.   On: 10/18/2019 08:32        Scheduled Meds: . dexamethasone  6 mg Oral Q24H  . enoxaparin (LOVENOX) injection  40 mg Subcutaneous Q24H  . loratadine  10 mg Oral Daily  . multivitamin with minerals  1 tablet Oral Daily  . pantoprazole  40 mg Oral Daily  . vitamin B-12  1,000 mcg Oral Daily   Continuous Infusions: . remdesivir 100 mg in NS 100 mL 100 mg (10/19/19 0833)     LOS: 1 day    Time spent: 25 minutes      Edwin Dada, MD [facility] 10/19/2019, 2:17 PM     Please page through [place]:  [email or web address] Contact charge nurse for password If 7PM-7AM, please contact night-coverage

## 2019-10-19 NOTE — Progress Notes (Signed)
Initial Nutrition Assessment  DOCUMENTATION CODES:   Not applicable  INTERVENTION:  - will order Hormel Shake once/day, each supplement provides 500 kcal and 22 grams protein. - will order Magic Cup BID with meals, each supplement provides 290 kcal and 9 grams of protein. - will order daily multivitamin with minerals. - continue to encourage PO intakes.    NUTRITION DIAGNOSIS:   Increased nutrient needs related to acute illness, catabolic [id number] infection) as evidenced by estimated needs.  GOAL:   Patient will meet greater than or equal to 90% of their needs  MONITOR:   PO intake, Supplement acceptance, Labs, Weight trends  REASON FOR ASSESSMENT:   Malnutrition Screening Tool, Consult Assessment of nutrition requirement/status  ASSESSMENT:   69 y.o. male with medical history of stage 3 CKD, GERD, and diagnosis of COVID-19 on 10/14/19. He presented to the ED with 1 week hx of worsening SOB, cough, fever, body aches, and not feeling well. He has been monitoring his oxygen saturations at home.  This morning, it was in the low 90s and his breathing was worsening. He denied any chest pain, N/V/D, abdominal pain, dysuria, loss of consciousness, or seizures.  His wife has also tested positive for Covid and is currently at home.  No intakes documented since admission. Weight on 4/21 was 200 lb, which appears to have been a stated weight. Weight on 08/31/19 was 216 lb. This indicates 16 lb weight loss (7.4% wt loss) in 1.5 months; significant for time frame.   Per notes: - [id number] with hypoxia - thrombocytopenia - stage 3 CKD   Labs reviewed; BUN: 30 mg/dl, creatinine: 1.37 mg/dl, GFR: 53 ml/min. Medications reviewed; 40 mg oral protonix/day, 100 mg IV remdesivir x2 doses 4/21, 100 mg IV remdesivir x1 dose/day (4/22-4/25), 1000 mcg oral cyanocobalamin/day.    NUTRITION - FOCUSED PHYSICAL EXAM:  unable to complete at this time.   Diet Order:   Diet Order           Diet regular Room service appropriate? Yes; Fluid consistency: Thin  Diet effective now              EDUCATION NEEDS:   No education needs have been identified at this time  Skin:  Skin Assessment: Reviewed RN Assessment  Last BM:  4/21  Height:   Ht Readings from Last 1 Encounters:  10/18/19 5\' 10"  (1.778 m)    Weight:   Wt Readings from Last 1 Encounters:  10/18/19 90.7 kg    Ideal Body Weight:  75.4 kg  BMI:  Body mass index is 28.7 kg/m.  Estimated Nutritional Needs:   Kcal:  [id number] kcal  Protein:  90-100 grams  Fluid:  >/= 2 L/day     Jarome Matin, MS, RD, LDN, CNSC Inpatient Clinical Dietitian RD pager # available in [facility]  After hours/weekend pager # available in [facility]

## 2019-10-19 NOTE — Progress Notes (Signed)
Paged Oncall Pt has a strong productive cough states that he  feels unrelieved with guaifenesin. See new orders will continue to monitor

## 2019-10-20 LAB — COMPREHENSIVE METABOLIC PANEL
ALT: 27 U/L (ref 0–44)
AST: 29 U/L (ref 15–41)
Albumin: 3 g/dL — ABNORMAL LOW (ref 3.5–5.0)
Alkaline Phosphatase: 58 U/L (ref 38–126)
Anion gap: 9 (ref 5–15)
BUN: 43 mg/dL — ABNORMAL HIGH (ref 8–23)
CO2: 21 mmol/L — ABNORMAL LOW (ref 22–32)
Calcium: 8.6 mg/dL — ABNORMAL LOW (ref 8.9–10.3)
Chloride: 107 mmol/L (ref 98–111)
Creatinine, Ser: 1.63 mg/dL — ABNORMAL HIGH (ref 0.61–1.24)
GFR calc Af Amer: 49 mL/min — ABNORMAL LOW (ref >60)
GFR calc non Af Amer: 43 mL/min — ABNORMAL LOW (ref >60)
Glucose, Bld: 151 mg/dL — ABNORMAL HIGH (ref 70–99)
Potassium: 4.6 mmol/L (ref 3.5–5.1)
Sodium: 137 mmol/L (ref 135–145)
Total Bilirubin: 1 mg/dL (ref 0.3–1.2)
Total Protein: 6.1 g/dL — ABNORMAL LOW (ref 6.5–8.1)

## 2019-10-20 LAB — CBC WITH DIFFERENTIAL/PLATELET
Abs Immature Granulocytes: 0.1 10*3/uL — ABNORMAL HIGH (ref 0.00–0.07)
Basophils Absolute: 0 10*3/uL (ref 0.0–0.1)
Basophils Relative: 0 %
Eosinophils Absolute: 0 10*3/uL (ref 0.0–0.5)
Eosinophils Relative: 0 %
HCT: 42.4 % (ref 39.0–52.0)
Hemoglobin: 13 g/dL (ref 13.0–17.0)
Immature Granulocytes: 1 %
Lymphocytes Relative: 7 %
Lymphs Abs: 0.8 10*3/uL (ref 0.7–4.0)
MCH: 24.6 pg — ABNORMAL LOW (ref 26.0–34.0)
MCHC: 30.7 g/dL (ref 30.0–36.0)
MCV: 80.2 fL (ref 80.0–100.0)
Monocytes Absolute: 0.7 10*3/uL (ref 0.1–1.0)
Monocytes Relative: 5 %
Neutro Abs: 10.8 10*3/uL — ABNORMAL HIGH (ref 1.7–7.7)
Neutrophils Relative %: 87 %
Platelets: 139 10*3/uL — ABNORMAL LOW (ref 150–400)
RBC: 5.29 MIL/uL (ref 4.22–5.81)
RDW: 15.1 % (ref 11.5–15.5)
WBC: 12.4 10*3/uL — ABNORMAL HIGH (ref 4.0–10.5)
nRBC: 0 % (ref 0.0–0.2)

## 2019-10-20 LAB — LACTATE DEHYDROGENASE: LDH: 282 U/L — ABNORMAL HIGH (ref 98–192)

## 2019-10-20 LAB — C-REACTIVE PROTEIN: CRP: 11.3 mg/dL — ABNORMAL HIGH (ref <1.0)

## 2019-10-20 LAB — MAGNESIUM: Magnesium: 2.5 mg/dL — ABNORMAL HIGH (ref 1.7–2.4)

## 2019-10-20 LAB — D-DIMER, QUANTITATIVE: D-Dimer, Quant: 0.68 ug/mL-FEU — ABNORMAL HIGH (ref 0.00–0.50)

## 2019-10-20 LAB — FERRITIN: Ferritin: 169 ng/mL (ref 24–336)

## 2019-10-20 MED ORDER — METHYLPREDNISOLONE SODIUM SUCC 40 MG IJ SOLR
40.0000 mg | Freq: Two times a day (BID) | INTRAMUSCULAR | Status: DC
  Administered 2019-10-20 – 2019-10-25 (×10): 40 mg via INTRAVENOUS
  Filled 2019-10-20 (×10): qty 1

## 2019-10-20 MED ORDER — TOCILIZUMAB 400 MG/20ML IV SOLN
8.0000 mg/kg | Freq: Once | INTRAVENOUS | Status: AC
  Administered 2019-10-20: 726 mg via INTRAVENOUS
  Filled 2019-10-20: qty 36.3

## 2019-10-20 MED ORDER — FUROSEMIDE 10 MG/ML IJ SOLN
40.0000 mg | Freq: Once | INTRAMUSCULAR | Status: AC
  Administered 2019-10-20: 40 mg via INTRAVENOUS
  Filled 2019-10-20: qty 4

## 2019-10-20 NOTE — Progress Notes (Signed)
PROGRESS NOTE    [facility]  [id number] DOB: 06-Dec-1950 DOA: 10/18/2019 PCP: Ann Held, DO      Brief Narrative:  Mr. Turetsky is a 69 y.o. M with CKD IIIa, GERD who presented with 1 week progressive SOB and hypoxia to 90% at home.  In the ER, CXR showed bilateral infiltrates and patient was hypoxic.              Assessment & Plan:  Coronavirus pneumonitis with acute hypoxic respiratory failure Presented with hypoxia, SOB and cough in the setting of the ongoing 2020-2021 COVID-19 pandemic.  O2 requirements increasing. -Continue remdesivir day 3 of 5 -Continue steroids day 3, increase dose -Continue VTE PPx with Lovenox  -Flutter valve, turn, cough, incentive spirometry q2hrs while awake -Nutrition consult -Lasix to achieve goal slightly net negative   Regarding off-label use of Actemra: This patient has confirmed COVID-19 in the setting of the ongoing 2020 coronavirus pandemic.  He has hypoxia and is high-risk for intubation (due to age, CRP > 14 mg/dL), but expected to survive >48 hours and has good baseline functional status.  He is not known to be on immunomodulators, anti-rejection medications, or cancer chemotherapy, has no history of TB or latent TB, and no history of diverticulitis or intestinal perforation.  Platelets are >50K, ANC is >500, and ALT/AST are below 5x ULN with no known hepatitis B infection. The investigational nature of this medication was discussed with the patient and he chooses to proceed as the potential benefits are felt to outweigh risks at this time.  -Tocilizumab 8 mg/kg now -Monitor for infusion reaction -Daily LFTs      GERD -Continue pantoprazole  CKD IIIa Cr slightly up today -Trend Cr on Lasix     Disposition: Status is: Inpatient  Remains inpatient appropriate because:Remains on oxygen requiring IV remdesivir   Dispo: The patient is from: Home              Anticipated d/c is to: Home               Anticipated d/c date is: 3 days              Patient currently is not medically stable to d/c.              DVT prophylaxis: Lovenox Code Status: Code Family Communication: Will call wife by phone  MDM:  The below labs and imaging reports reviewed and summarized above.  Medication management as above. This is a severe acute illness with threat to life    Procedures:     Antimicrobials:   Remdesivir only  Culture data:   4/21 blood culture x2-no growth to date      Subjective: Feeling extremely out of breath, fatigued, "weaker than pond water".  With any exertion desats to 70%.    No fever, sputum, hemoptyiss, leg swelling, confusion.        Objective: Vitals:   10/20/19 0830 10/20/19 0835 10/20/19 0850 10/20/19 0900  BP:      Pulse:      Resp: (!) 24 20  20   Temp:      TempSrc:      SpO2: (!) 88% 92% 91% 92%  Weight:      Height:        Intake/Output Summary (Last 24 hours) at 10/20/2019 0945 Last data filed at 10/19/2019 1500 Gross per 24 hour  Intake 220 ml  Output --  Net 220 ml  Filed Weights   10/18/19 0730  Weight: 90.7 kg    Examination: General appearance: Adult male, lying in bed, appears extremely fatigued, no obvious distress.  Interactive. HEENT: Anicteric, conjunctival pink, lids and lashes normal.  No nasal deformity, discharge, or epistaxis.  Lips moist, dentition good repair, oropharynx tacky dry, no oral lesions, hearing normal. Skin: Skin warm and dry without suspicious rashes or lesions. Cardiac: Tachycardic, regular, no murmurs appreciated, JVP not visible, no lower extremity edema Respiratory: Tachypneic without accessory muscle use or grunting.  Lungs diminished, rales bilaterally. Abdomen: Abdomen soft, no tenderness palpation or guarding, no ascites or distention.   MSK: Normal muscle bulk and tone Neuro: Awake alert but extremely fatigued, extraocular movements intact, face symmetric, moves all  extremities with generalized weakness which is severe, but normal coordination, speech fluent. Psych: Attention normal, oriented to person, place, and time, judgment and insight appear normal.          Data Reviewed: I have personally reviewed following labs and imaging studies:  CBC: Recent Labs  Lab 10/18/19 0807 10/19/19 0138 10/20/19 0206  WBC 7.7 7.7 12.4*  NEUTROABS 6.5 6.5 10.8*  HGB 13.5 14.0 13.0  HCT 42.0 45.0 42.4  MCV 77.2* 79.4* 80.2  PLT 115* 136* [id number]*   Basic Metabolic Panel: Recent Labs  Lab 10/18/19 0807 10/19/19 0138 10/20/19 0206  NA 133* 136 137  K 3.7 3.9 4.6  CL 103 103 107  CO2 21* 23 21*  GLUCOSE 118* 141* 151*  BUN 23 30* 43*  CREATININE 1.36* 1.37* 1.63*  CALCIUM 8.5* 8.9 8.6*  MG  --  2.3 2.5*   GFR: Estimated Creatinine Clearance: 49.1 mL/min (A) (by C-G formula based on SCr of 1.63 mg/dL (H)). Liver Function Tests: Recent Labs  Lab 10/18/19 0807 10/19/19 0138 10/20/19 0206  AST 30 34 29  ALT 22 27 27   ALKPHOS 64 69 58  BILITOT 1.5* 0.9 1.0  PROT 6.8 7.0 6.1*  ALBUMIN 3.5 3.4* 3.0*   No results for input(s): LIPASE, AMYLASE in the last 168 hours. No results for input(s): AMMONIA in the last 168 hours. Coagulation Profile: No results for input(s): INR, PROTIME in the last 168 hours. Cardiac Enzymes: No results for input(s): CKTOTAL, CKMB, CKMBINDEX, TROPONINI in the last 168 hours. BNP (last 3 results) No results for input(s): PROBNP in the last 8760 hours. HbA1C: No results for input(s): HGBA1C in the last 72 hours. CBG: No results for input(s): GLUCAP in the last 168 hours. Lipid Profile: Recent Labs    10/18/19 0807  TRIG 111   Thyroid Function Tests: No results for input(s): TSH, T4TOTAL, FREET4, T3FREE, THYROIDAB in the last 72 hours. Anemia Panel: Recent Labs    10/19/19 0138 10/20/19 0206  FERRITIN 163 169   Urine analysis:    Component Value Date/Time   COLORURINE YELLOW 10/10/2018 1144    APPEARANCEUR CLEAR 10/10/2018 1144   LABSPEC 1.020 10/10/2018 1144   PHURINE 6.0 10/10/2018 1144   GLUCOSEU NEGATIVE 10/10/2018 1144   HGBUR NEGATIVE 10/10/2018 1144   BILIRUBINUR SMALL (A) 10/10/2018 1144   BILIRUBINUR neg 06/11/2016 1701   KETONESUR NEGATIVE 10/10/2018 1144   PROTEINUR NEGATIVE 10/10/2018 1144   UROBILINOGEN 0.2 06/11/2016 1701   UROBILINOGEN 0.2 07/17/2011 0900   NITRITE NEGATIVE 10/10/2018 1144   LEUKOCYTESUR NEGATIVE 10/10/2018 1144   Sepsis Labs: @LABRCNTIP (procalcitonin:4,lacticacidven:4)  ) Recent Results (from the past 240 hour(s))  Blood Culture (routine x 2)     Status: None (Preliminary result)   Collection  Time: 10/18/19  8:20 AM   Specimen: Left Antecubital; Blood  Result Value Ref Range Status   Specimen Description   Final    LEFT ANTECUBITAL Performed at [facility], [place], [place] [address]    Special Requests   Final    BOTTLES DRAWN AEROBIC AND ANAEROBIC Blood Culture adequate volume Performed at [facility], [address] [place], [place] [address]    Culture   Final    NO GROWTH < 24 HOURS Performed at [facility], [place] [address] [place], [place] [address]    Report Status PENDING  Incomplete  Blood Culture (routine x 2)     Status: None (Preliminary result)   Collection Time: 10/18/19  8:33 AM   Specimen: Right Antecubital; Blood  Result Value Ref Range Status   Specimen Description   Final    RIGHT ANTECUBITAL Performed at [facility], [address] [place], [place] [address]    Special Requests   Final    BOTTLES DRAWN AEROBIC AND ANAEROBIC Blood Culture adequate volume Performed at [facility], [address] [place], [place] [address]    Culture   Final    NO GROWTH < 24 HOURS Performed at [facility], [place] [address], [place], [place] [address]    Report Status PENDING  Incomplete         Radiology Studies: No results  found.      Scheduled Meds: . enoxaparin (LOVENOX) injection  40 mg Subcutaneous Q24H  . furosemide  40 mg Intravenous Once  . loratadine  10 mg Oral Daily  . methylPREDNISolone (SOLU-MEDROL) injection  40 mg Intravenous Q12H  . multivitamin with minerals  1 tablet Oral Daily  . pantoprazole  40 mg Oral Daily  . vitamin B-12  1,000 mcg Oral Daily   Continuous Infusions: . remdesivir 100 mg in NS 100 mL 100 mg (10/20/19 0818)  . tocilizumab (ACTEMRA) - non-COVID treatment       LOS: 2 days    Time spent: 35 minutes      Edwin Dada, MD [facility] 10/20/2019, 9:45 AM     Please page through [place]:  [email or web address] Contact charge nurse for password If 7PM-7AM, please contact night-coverage

## 2019-10-20 NOTE — Care Management Important Message (Signed)
Important Message  Patient Details IM Letter given to RN to present to the Patient Name: Anthony King MRN: [id number] Date of Birth: [birth year]   Medicare Important Message Given:  Yes     Kerin Salen 10/20/2019, 10:33 AM

## 2019-10-20 NOTE — Evaluation (Signed)
Physical Therapy Evaluation Patient Details Name: Anthony King MRN: [id number] DOB: 21-Mar-1951 Today's Date: 10/20/2019   History of Present Illness  Anthony King is a 69 y.o. M with CKD IIIa, GERD who presented with 1 week progressive SOB and hypoxia to 90% at home. In the ER, CXR showed bilateral infiltrates and patient was hypoxic. Pt is positive for covid19.    Clinical Impression  Pt admitted with above diagnosis. Pt well below baseline, initial attempt to rise from bed required return to sitting due to loss of balance backwards from maintaining weight in heels. Pt able to perform additional STS successfully and take steps with min assist to steady. Verbal cues for pursed lip breathing and mild SOB with mobility. Pt desat to 88% once during evaluation, but able to recover >92% with pursed lip breathing cues. Pt tearful on and off throughout evaluation due to "rough morning" with O2 need increasing significantly due to coughing fit, upset about spouse at home sick with covid and remembering his mother who passed from covid Dec 2020. Pt is motivated to return home to family. Pt currently with functional limitations due to the deficits listed below (see PT Problem List). Pt will benefit from skilled PT to increase their independence and safety with mobility to allow discharge to the venue listed below.       Follow Up Recommendations Home health PT;Supervision - Intermittent    Equipment Recommendations  Other (comment)(pending improvement)    Recommendations for Other Services       Precautions / Restrictions Precautions Precautions: Fall Precaution Comments: monitor O2 Restrictions Weight Bearing Restrictions: No      Mobility  Bed Mobility Overal bed mobility: Needs Assistance Bed Mobility: Supine to Sit;Sit to Supine  Supine to sit: Supervision Sit to supine: Supervision  General bed mobility comments: increased time, slow movement with mild SOB  Transfers Overall  transfer level: Needs assistance Equipment used: None Transfers: Sit to/from Stand Sit to Stand: Min assist;From elevated surface  General transfer comment: initial attempt with loss of balance backwards requiring return to sitting on bed; min assist to steady with 2nd rep, braces BLE on bed, maintains weight through heels initially but improves with time  Ambulation/Gait Ambulation/Gait assistance: Min assist Assistive device: None  General Gait Details: limited to 3-4 sidesteps to HOB, no AD, weight through heels, min assist to steady  Stairs            Wheelchair Mobility    Modified Rankin (Stroke Patients Only)       Balance Overall balance assessment: Needs assistance Sitting-balance support: Feet supported;No upper extremity supported Sitting balance-Leahy Scale: Good Sitting balance - Comments: seated EOB   Standing balance support: During functional activity;No upper extremity supported Standing balance-Leahy Scale: Poor Standing balance comment: min assist to steady with sidesteps          Pertinent Vitals/Pain Pain Assessment: No/denies pain    Home Living Family/patient expects to be discharged to:: Private residence Living Arrangements: Spouse/significant other Available Help at Discharge: Family;Available 24 hours/day Type of Home: House Home Access: Level entry     Home Layout: One level Home Equipment: None      Prior Function Level of Independence: Independent         Comments: Pt reports he is Ind with ADLs and ambulates unlimited distances without AD. 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 Pt reports spouse is at home with him and she is normally independent, but  currently is sick with covid.     Hand Dominance        Extremity/Trunk Assessment   Upper Extremity Assessment Upper Extremity Assessment: Overall WFL for tasks assessed    Lower Extremity Assessment Lower Extremity Assessment:  Overall WFL for tasks assessed(BLE AROM WNL, grossly 3+/5)    Cervical / Trunk Assessment Cervical / Trunk Assessment: Normal  Communication   Communication: No difficulties  Cognition Arousal/Alertness: Awake/alert Behavior During Therapy: WFL for tasks assessed/performed Overall Cognitive Status: Within Functional Limits for tasks assessed            General Comments: Pt tearful reporting his mother passed away due to covid July 14, 2019 and he wasn't able to tell her goodbye; upset that his spouse is at home sick with covid currently as well. Pt is motivated to get home back to his family.      General Comments General comments (skin integrity, edema, etc.): on 5L O2, O2 sat 88-96% with mobility, able to return over 92% with cues for pursed lip breathing and seated rest    Exercises     Assessment/Plan    PT Assessment Patient needs continued PT services  PT Problem List Decreased strength;Decreased activity tolerance;Decreased balance;Decreased mobility;Cardiopulmonary status limiting activity       PT Treatment Interventions DME instruction;Gait training;Functional mobility training;Therapeutic activities;Therapeutic exercise;Balance training;Neuromuscular re-education;Patient/family education;Modalities    PT Goals (Current goals can be found in the Care Plan section)  Acute Rehab PT Goals Patient Stated Goal: get home to wife and grandkids PT Goal Formulation: With patient Time For Goal Achievement: 10/27/19 Potential to Achieve Goals: Good    Frequency Min 3X/week   Barriers to discharge        Co-evaluation               AM-PAC PT "6 Clicks" Mobility  Outcome Measure Help needed turning from your back to your side while in a flat bed without using bedrails?: None Help needed moving from lying on your back to sitting on the side of a flat bed without using bedrails?: None Help needed moving to and from a bed to a chair (including a wheelchair)?: A  Little Help needed standing up from a chair using your arms (e.g., wheelchair or bedside chair)?: A Little Help needed to walk in hospital room?: A Lot Help needed climbing 3-5 steps with a railing? : Total 6 Click Score: 17    End of Session Equipment Utilized During Treatment: Oxygen Activity Tolerance: Patient limited by fatigue Patient left: in bed;with call bell/phone within reach Nurse Communication: Mobility status;Other (comment)(O2) PT Visit Diagnosis: Unsteadiness on feet (R26.81);Other abnormalities of gait and mobility (R26.89)    Time: [id number] PT Time Calculation (min) (ACUTE ONLY): 18 min   Charges:   PT Evaluation $PT Eval Moderate Complexity: 1 Mod           Tori Dervin Vore PT, DPT 10/20/19, 2:45 PM [phone]

## 2019-10-21 LAB — CBC WITH DIFFERENTIAL/PLATELET
Abs Immature Granulocytes: 0.06 10*3/uL (ref 0.00–0.07)
Basophils Absolute: 0 10*3/uL (ref 0.0–0.1)
Basophils Relative: 0 %
Eosinophils Absolute: 0 10*3/uL (ref 0.0–0.5)
Eosinophils Relative: 0 %
HCT: 41.1 % (ref 39.0–52.0)
Hemoglobin: 12.8 g/dL — ABNORMAL LOW (ref 13.0–17.0)
Immature Granulocytes: 1 %
Lymphocytes Relative: 7 %
Lymphs Abs: 0.7 10*3/uL (ref 0.7–4.0)
MCH: 24.5 pg — ABNORMAL LOW (ref 26.0–34.0)
MCHC: 31.1 g/dL (ref 30.0–36.0)
MCV: 78.6 fL — ABNORMAL LOW (ref 80.0–100.0)
Monocytes Absolute: 0.4 10*3/uL (ref 0.1–1.0)
Monocytes Relative: 4 %
Neutro Abs: 9 10*3/uL — ABNORMAL HIGH (ref 1.7–7.7)
Neutrophils Relative %: 88 %
Platelets: 114 10*3/uL — ABNORMAL LOW (ref 150–400)
RBC: 5.23 MIL/uL (ref 4.22–5.81)
RDW: 15.2 % (ref 11.5–15.5)
WBC: 10.2 10*3/uL (ref 4.0–10.5)
nRBC: 0 % (ref 0.0–0.2)

## 2019-10-21 LAB — COMPREHENSIVE METABOLIC PANEL
ALT: 36 U/L (ref 0–44)
AST: 34 U/L (ref 15–41)
Albumin: 3 g/dL — ABNORMAL LOW (ref 3.5–5.0)
Alkaline Phosphatase: 57 U/L (ref 38–126)
Anion gap: 9 (ref 5–15)
BUN: 46 mg/dL — ABNORMAL HIGH (ref 8–23)
CO2: 22 mmol/L (ref 22–32)
Calcium: 8.2 mg/dL — ABNORMAL LOW (ref 8.9–10.3)
Chloride: 106 mmol/L (ref 98–111)
Creatinine, Ser: 1.46 mg/dL — ABNORMAL HIGH (ref 0.61–1.24)
GFR calc Af Amer: 56 mL/min — ABNORMAL LOW (ref >60)
GFR calc non Af Amer: 49 mL/min — ABNORMAL LOW (ref >60)
Glucose, Bld: 147 mg/dL — ABNORMAL HIGH (ref 70–99)
Potassium: 4.3 mmol/L (ref 3.5–5.1)
Sodium: 137 mmol/L (ref 135–145)
Total Bilirubin: 0.8 mg/dL (ref 0.3–1.2)
Total Protein: 6.4 g/dL — ABNORMAL LOW (ref 6.5–8.1)

## 2019-10-21 LAB — LACTATE DEHYDROGENASE: LDH: 303 U/L — ABNORMAL HIGH (ref 98–192)

## 2019-10-21 LAB — BRAIN NATRIURETIC PEPTIDE: B Natriuretic Peptide: 171.7 pg/mL — ABNORMAL HIGH (ref 0.0–100.0)

## 2019-10-21 LAB — D-DIMER, QUANTITATIVE: D-Dimer, Quant: 0.57 ug/mL-FEU — ABNORMAL HIGH (ref 0.00–0.50)

## 2019-10-21 LAB — FERRITIN: Ferritin: 149 ng/mL (ref 24–336)

## 2019-10-21 LAB — MAGNESIUM: Magnesium: 2.6 mg/dL — ABNORMAL HIGH (ref 1.7–2.4)

## 2019-10-21 LAB — C-REACTIVE PROTEIN: CRP: 6.8 mg/dL — ABNORMAL HIGH (ref <1.0)

## 2019-10-21 MED ORDER — FUROSEMIDE 10 MG/ML IJ SOLN
20.0000 mg | Freq: Once | INTRAMUSCULAR | Status: AC
  Administered 2019-10-21: 20 mg via INTRAVENOUS
  Filled 2019-10-21: qty 2

## 2019-10-21 NOTE — Progress Notes (Signed)
Physical Therapy Treatment Patient Details Name: Anthony King MRN: [id number] DOB: [birth year] Today's Date: 10/21/2019    History of Present Illness Mr. Vandongen is a 69 y.o. M with CKD IIIa, GERD who presented with 1 week progressive SOB and hypoxia to 90% at home. In the ER, CXR showed bilateral infiltrates and patient was hypoxic. Pt is positive for covid19.    PT Comments    Patient reports that he has been in recliner, had bad coughing spell and required NRB to recover. Currently on 3 L HFNC , SPO2 95%. Noted  Dropping to 88 % with UE exercises.with return to 955. Continue  Progressive activity as tolerated.   Follow Up Recommendations  Home health PT;Supervision - Intermittent     Equipment Recommendations  Other (comment)    Recommendations for Other Services       Precautions / Restrictions Precautions Precautions: Fall Precaution Comments: monitor O2- try ear probe    Mobility  Bed Mobility               General bed mobility comments: declined OOB  Transfers                    Ambulation/Gait                 Stairs             Wheelchair Mobility    Modified Rankin (Stroke Patients Only)       Balance                                            Cognition Arousal/Alertness: Awake/alert Behavior During Therapy: WFL for tasks assessed/performed                                   General Comments: Pt tearful reporting his mother passed away due to covid 16-Jul-2019 and he wasn't able to tell her goodbye; upset that his spouse is at home sick with covid currently as well. Pt is motivated to get home back to his family.      Exercises General Exercises - Upper Extremity Shoulder Flexion: AROM;Theraband;10 reps;Both;Supine Theraband Level (Shoulder Flexion): Level 4 (Blue) Shoulder Extension: AROM;Both;Supine;10 reps;Theraband Theraband Level (Shoulder Extension): Level 4 (Blue) Shoulder  ABduction: AROM;Both;10 reps;Theraband Theraband Level (Shoulder Abduction): Level 4 (Blue) Elbow Flexion: AROM;10 reps;Theraband;Both;Supine Theraband Level (Elbow Flexion): Level 4 (Blue) Elbow Extension: AROM;Both;Supine;10 reps;Theraband Theraband Level (Elbow Extension): Level 4 (Blue) General Exercises - Lower Extremity Heel Slides: AROM;10 reps;Supine;Both    General Comments        Pertinent Vitals/Pain Pain Assessment: Faces Faces Pain Scale: Hurts even more Pain Location: chest from coughing so hard earlier, needed NRB mask    Home Living Family/patient expects to be discharged to:: Private residence                    Prior Function            PT Goals (current goals can now be found in the care plan section) Progress towards PT goals: Progressing toward goals    Frequency    Min 3X/week      PT Plan Current plan remains appropriate    Co-evaluation              AM-PAC PT "  6 Clicks" Mobility   Outcome Measure  Help needed turning from your back to your side while in a flat bed without using bedrails?: None Help needed moving from lying on your back to sitting on the side of a flat bed without using bedrails?: None Help needed moving to and from a bed to a chair (including a wheelchair)?: A Little Help needed standing up from a chair using your arms (e.g., wheelchair or bedside chair)?: A Lot Help needed to walk in hospital room?: A Lot Help needed climbing 3-5 steps with a railing? : Total 6 Click Score: 16    End of Session Equipment Utilized During Treatment: Oxygen Activity Tolerance: Patient limited by fatigue Patient left: in bed;with call bell/phone within reach Nurse Communication: Mobility status;Other (comment) PT Visit Diagnosis: Unsteadiness on feet (R26.81);Other abnormalities of gait and mobility (R26.89)     Time: [id number] PT Time Calculation (min) (ACUTE ONLY): 24 min  Charges:  $Therapeutic Exercise: 23-37  mins                     Tresa Endo PT Acute Rehabilitation Services Pager [phone] Office [phone]    Claretha Cooper 10/21/2019, 4:56 PM

## 2019-10-21 NOTE — Progress Notes (Signed)
PROGRESS NOTE    Anthony King  [id number] DOB: 13-Sep-1950 DOA: 10/18/2019 PCP: Ann Held, DO      Brief Narrative:  [name] is a 69 y.o. M with CKD IIIa, GERD who presented with 1 week progressive SOB and hypoxia to 90% at home.  In the ER, CXR showed bilateral infiltrates and patient was hypoxic.              Assessment & Plan:  Coronavirus pneumonitis with acute hypoxic respiratory failure Presented with hypoxia, SOB and cough in the setting of the ongoing 2020-2021 COVID-19 pandemic.  S/p Actemra 4/24  Stable on 3-4L at rest, but desaturates to 60-70% with exertion. -Continue remdesivir day 4 of 5 -Continue steroids day 4  -Continue VTE PPx with Lovenox  -Flutter valve, turn, cough, incentive spirometry q2hrs while awake -Nutrition consult -Repeat IV Lasix       GERD -Continue pantoprazole  CKD IIIa Cr stable -Daily BMP    Disposition: Status is: Inpatient  Remains inpatient appropriate because:Remains on oxygen requiring IV remdesivir   Dispo: The patient is from: Home              Anticipated d/c is to: Home              Anticipated d/c date is: > 3 days              Patient currently is not medically stable to d/c.  Patient continues to have severe SpO2 desats with exertion.  Will likely need several more days admission.          DVT prophylaxis: Lovenox Code Status: Code Family Communication: Wife by phone  MDM:  The below labs and imaging reports reviewed and summarized above.  Medication management as above.  This is a severe acute illness with threat to life    Procedures:     Antimicrobials:   Remdesivir only  Culture data:   4/21 blood culture x2 - no growth to date      Subjective: Patient remains weak, fatigued, out of breath with minimal exertion.  He has severe desaturations with exertion.  No fever, sputum, hemoptysis, leg swelling, confusion.          Objective: Vitals:   10/20/19 0900 10/20/19 1300 10/20/19 2101 10/21/19 0539  BP:  122/70 128/79 115/68  Pulse:   (!) 58 (!) 50  Resp: 20  18 16   Temp:  98 F (36.7 C) 97.8 F (36.6 C) 98.7 F (37.1 C)  TempSrc:  Oral Oral   SpO2: 92%  95% 96%  Weight:      Height:        Intake/Output Summary (Last 24 hours) at 10/21/2019 0841 Last data filed at 10/21/2019 [id number] Gross per 24 hour  Intake 500 ml  Output 1100 ml  Net -600 ml   Filed Weights   10/18/19 0730  Weight: 90.7 kg    Examination: General appearance: Elderly adult male, lying in bed, appears tired, no obvious distress.    HEENT: Anicteric, conjunctival pink, lids and lashes normal.  No nasal deformity, discharge, or epistaxis.  Lips moist, dentition normal, oropharynx moist, no oral lesions, hearing normal. Skin: Skin warm and dry, without suspicious rashes or lesions. Cardiac: Tachycardic, regular, no murmurs appreciated, JVP not visible, no lower extremity edema Respiratory: Tachypneic without accessory muscle use or grunting, lungs diminished, rales are resolved. Abdomen: Abdomen soft without tenderness palpation or guarding, no ascites or distention. MSK: Normal muscle  bulk and tone Neuro: Awake and alert, fatigued, extraocular movements intact, moves all extremities with generalized weakness, normal coordination, speech fluent. Psych: Attention normal, affect normal, judgment insight appear normal.          Data Reviewed: I have personally reviewed following labs and imaging studies:  CBC: Recent Labs  Lab 10/18/19 0807 10/19/19 0138 10/20/19 0206 10/21/19 0204  WBC 7.7 7.7 12.4* 10.2  NEUTROABS 6.5 6.5 10.8* 9.0*  HGB 13.5 14.0 13.0 12.8*  HCT 42.0 45.0 42.4 41.1  MCV 77.2* 79.4* 80.2 78.6*  PLT 115* 136* 139* [id number]*   Basic Metabolic Panel: Recent Labs  Lab 10/18/19 0807 10/19/19 0138 10/20/19 0206 10/21/19 0204  NA 133* 136 137 137  K 3.7 3.9 4.6 4.3  CL 103 103 107 106   CO2 21* 23 21* 22  GLUCOSE 118* 141* 151* 147*  BUN 23 30* 43* 46*  CREATININE 1.36* 1.37* 1.63* 1.46*  CALCIUM 8.5* 8.9 8.6* 8.2*  MG  --  2.3 2.5* 2.6*   GFR: Estimated Creatinine Clearance: 54.9 mL/min (A) (by C-G formula based on SCr of 1.46 mg/dL (H)). Liver Function Tests: Recent Labs  Lab 10/18/19 0807 10/19/19 0138 10/20/19 0206 10/21/19 0204  AST 30 34 29 34  ALT 22 27 27  36  ALKPHOS 64 69 58 57  BILITOT 1.5* 0.9 1.0 0.8  PROT 6.8 7.0 6.1* 6.4*  ALBUMIN 3.5 3.4* 3.0* 3.0*   No results for input(s): LIPASE, AMYLASE in the last 168 hours. No results for input(s): AMMONIA in the last 168 hours. Coagulation Profile: No results for input(s): INR, PROTIME in the last 168 hours. Cardiac Enzymes: No results for input(s): CKTOTAL, CKMB, CKMBINDEX, TROPONINI in the last 168 hours. BNP (last 3 results) No results for input(s): PROBNP in the last 8760 hours. HbA1C: No results for input(s): HGBA1C in the last 72 hours. CBG: No results for input(s): GLUCAP in the last 168 hours. Lipid Profile: No results for input(s): CHOL, HDL, LDLCALC, TRIG, CHOLHDL, LDLDIRECT in the last 72 hours. Thyroid Function Tests: No results for input(s): TSH, T4TOTAL, FREET4, T3FREE, THYROIDAB in the last 72 hours. Anemia Panel: Recent Labs    10/20/19 0206 10/21/19 0204  FERRITIN 169 149   Urine analysis:    Component Value Date/Time   COLORURINE YELLOW 10/10/2018 [place] 10/10/2018 1144   LABSPEC 1.020 10/10/2018 1144   PHURINE 6.0 10/10/2018 1144   GLUCOSEU NEGATIVE 10/10/2018 1144   HGBUR NEGATIVE 10/10/2018 1144   BILIRUBINUR SMALL (A) 10/10/2018 1144   BILIRUBINUR neg 06/11/2016 1701   KETONESUR NEGATIVE 10/10/2018 1144   PROTEINUR NEGATIVE 10/10/2018 1144   UROBILINOGEN 0.2 06/11/2016 1701   UROBILINOGEN 0.2 07/17/2011 0900   NITRITE NEGATIVE 10/10/2018 1144   LEUKOCYTESUR NEGATIVE 10/10/2018 1144   Sepsis  Labs: @LABRCNTIP (procalcitonin:4,lacticacidven:4)  ) Recent Results (from the past 240 hour(s))  Blood Culture (routine x 2)     Status: None (Preliminary result)   Collection Time: 10/18/19  8:20 AM   Specimen: Left Antecubital; Blood  Result Value Ref Range Status   Specimen Description   Final    LEFT ANTECUBITAL Performed at [facility], [address] [place], [place] [address]    Special Requests   Final    BOTTLES DRAWN AEROBIC AND ANAEROBIC Blood Culture adequate volume Performed at [facility], [address] [place], [place] [address]    Culture   Final    NO GROWTH 2 DAYS Performed at  [facility], [place] [address] [place], [place] [address]    Report Status PENDING  Incomplete  Blood Culture (routine x 2)     Status: None (Preliminary result)   Collection Time: 10/18/19  8:33 AM   Specimen: Right Antecubital; Blood  Result Value Ref Range Status   Specimen Description   Final    RIGHT ANTECUBITAL Performed at [facility], [address] [place], [place] [address]    Special Requests   Final    BOTTLES DRAWN AEROBIC AND ANAEROBIC Blood Culture adequate volume Performed at [facility], [address] [place], [place] [address]    Culture   Final    NO GROWTH 2 DAYS Performed at [facility], [place] [address] [place], [place] [address]    Report Status PENDING  Incomplete         Radiology Studies: No results found.      Scheduled Meds: . enoxaparin (LOVENOX) injection  40 mg Subcutaneous Q24H  . loratadine  10 mg Oral Daily  . methylPREDNISolone (SOLU-MEDROL) injection  40 mg Intravenous Q12H  . multivitamin with minerals  1 tablet Oral Daily  . pantoprazole  40 mg Oral Daily  . vitamin B-12  1,000 mcg Oral Daily   Continuous Infusions: . remdesivir 100 mg in NS 100 mL 100 mg (10/21/19 0840)     LOS: 3 days    Time spent: 35 minutes      Edwin Dada, MD [facility] 10/21/2019, 8:41 AM     Please page through [place]:  [email or web address] Contact charge nurse for password If 7PM-7AM, please contact night-coverage

## 2019-10-22 LAB — COMPREHENSIVE METABOLIC PANEL
ALT: 56 U/L — ABNORMAL HIGH (ref 0–44)
AST: 42 U/L — ABNORMAL HIGH (ref 15–41)
Albumin: 3.1 g/dL — ABNORMAL LOW (ref 3.5–5.0)
Alkaline Phosphatase: 64 U/L (ref 38–126)
Anion gap: 8 (ref 5–15)
BUN: 47 mg/dL — ABNORMAL HIGH (ref 8–23)
CO2: 25 mmol/L (ref 22–32)
Calcium: 8.6 mg/dL — ABNORMAL LOW (ref 8.9–10.3)
Chloride: 107 mmol/L (ref 98–111)
Creatinine, Ser: 1.46 mg/dL — ABNORMAL HIGH (ref 0.61–1.24)
GFR calc Af Amer: 56 mL/min — ABNORMAL LOW (ref >60)
GFR calc non Af Amer: 49 mL/min — ABNORMAL LOW (ref >60)
Glucose, Bld: 132 mg/dL — ABNORMAL HIGH (ref 70–99)
Potassium: 4.7 mmol/L (ref 3.5–5.1)
Sodium: 140 mmol/L (ref 135–145)
Total Bilirubin: 1.1 mg/dL (ref 0.3–1.2)
Total Protein: 6.4 g/dL — ABNORMAL LOW (ref 6.5–8.1)

## 2019-10-22 LAB — LACTATE DEHYDROGENASE: LDH: 354 U/L — ABNORMAL HIGH (ref 98–192)

## 2019-10-22 LAB — CBC WITH DIFFERENTIAL/PLATELET
Abs Immature Granulocytes: 0.27 10*3/uL — ABNORMAL HIGH (ref 0.00–0.07)
Basophils Absolute: 0 10*3/uL (ref 0.0–0.1)
Basophils Relative: 0 %
Eosinophils Absolute: 0 10*3/uL (ref 0.0–0.5)
Eosinophils Relative: 0 %
HCT: 43.9 % (ref 39.0–52.0)
Hemoglobin: 13.6 g/dL (ref 13.0–17.0)
Immature Granulocytes: 2 %
Lymphocytes Relative: 8 %
Lymphs Abs: 0.9 10*3/uL (ref 0.7–4.0)
MCH: 24.4 pg — ABNORMAL LOW (ref 26.0–34.0)
MCHC: 31 g/dL (ref 30.0–36.0)
MCV: 78.8 fL — ABNORMAL LOW (ref 80.0–100.0)
Monocytes Absolute: 0.6 10*3/uL (ref 0.1–1.0)
Monocytes Relative: 5 %
Neutro Abs: 9.8 10*3/uL — ABNORMAL HIGH (ref 1.7–7.7)
Neutrophils Relative %: 85 %
Platelets: 172 10*3/uL (ref 150–400)
RBC: 5.57 MIL/uL (ref 4.22–5.81)
RDW: 15.2 % (ref 11.5–15.5)
WBC: 11.5 10*3/uL — ABNORMAL HIGH (ref 4.0–10.5)
nRBC: 0 % (ref 0.0–0.2)

## 2019-10-22 LAB — C-REACTIVE PROTEIN: CRP: 4.5 mg/dL — ABNORMAL HIGH (ref <1.0)

## 2019-10-22 LAB — MAGNESIUM: Magnesium: 2.6 mg/dL — ABNORMAL HIGH (ref 1.7–2.4)

## 2019-10-22 LAB — FERRITIN: Ferritin: 153 ng/mL (ref 24–336)

## 2019-10-22 LAB — D-DIMER, QUANTITATIVE: D-Dimer, Quant: 0.83 ug/mL-FEU — ABNORMAL HIGH (ref 0.00–0.50)

## 2019-10-22 NOTE — Progress Notes (Signed)
PROGRESS NOTE  Anthony King [id number] DOB: October 10, 1950 DOA: 10/18/2019 PCP: Ann Held, DO  Brief History   Anthony King is a 69 y.o. M with CKD IIIa, GERD who presented with 1 week progressive SOB and hypoxia to 90% at home. In the ER, CXR showed bilateral infiltrates and patient was hypoxic. He was found to be positive for COVID-19.   [facility] were consulted to admit the patient for further evaluation and care.   He is receiving IV remdesevir and steroids, as well as subcutaneous lovenox. He also continue to have IV Lasix.   Consultants  . None  Procedures  . None  Antibiotics   Anti-infectives (From admission, onward)   Start     Dose/Rate Route Frequency Ordered Stop   10/19/19 1000  remdesivir 100 mg in sodium chloride 0.9 % 100 mL IVPB     100 mg 200 mL/hr over 30 Minutes Intravenous Daily 10/18/19 1012 10/22/19 0853   10/18/19 1100  remdesivir 100 mg in sodium chloride 0.9 % 100 mL IVPB     100 mg 200 mL/hr over 30 Minutes Intravenous  Once 10/18/19 1011 10/18/19 1153   10/18/19 1030  remdesivir 100 mg in sodium chloride 0.9 % 100 mL IVPB     100 mg 200 mL/hr over 30 Minutes Intravenous  Once 10/18/19 1011 10/18/19 1117    .   Subjective  The patient is resting in bed. He has dyspnea and hypoxia with conversation.   Objective   Vitals:  Vitals:   10/22/19 1100 10/22/19 1538  BP:  125/74  Pulse:  (!) 57  Resp: 20 (!) 21  Temp:  98.1 F (36.7 C)  SpO2: 95% 91%   Exam:  Constitutional:  . The patient is awake, alert, and oriented x 3. No acute distress. Respiratory:  . No increased work of breathing. . No wheezes, rales, or rhonchi . No tactile fremitus . Poor air entry. Cardiovascular:  . Regular rate and rhythm . No murmurs, ectopy, or gallups. . No lateral PMI. No thrills. Abdomen:  . Abdomen is soft, non-tender, non-distended . No hernias, masses, or organomegaly . Normoactive bowel sounds.   Musculoskeletal:  . No cyanosis, clubbing, or edema Skin:  . No rashes, lesions, ulcers . palpation of skin: no induration or nodules Neurologic:  . CN 2-12 intact . Sensation all 4 extremities intact Psychiatric:  . Mental status o Mood, affect appropriate o Orientation to person, place, time  . judgment and insight appear intact I have personally reviewed the following:   Today's Data  . Vitals, CMP, magnesium, and CBC .  Micro Data  . Blood cultures have had no growth in 4 days.  Cardiology Data  . EKG  Scheduled Meds: . enoxaparin (LOVENOX) injection  40 mg Subcutaneous Q24H  . loratadine  10 mg Oral Daily  . methylPREDNISolone (SOLU-MEDROL) injection  40 mg Intravenous Q12H  . multivitamin with minerals  1 tablet Oral Daily  . pantoprazole  40 mg Oral Daily  . vitamin B-12  1,000 mcg Oral Daily   Continuous Infusions:  Principal Problem:   Pneumonia due to COVID-19 virus Active Problems:   CKD (chronic kidney disease), stage III   Hypoxia   LOS: 4 days   A & P  Coronavirus pneumonitis with acute hypoxic respiratory failure:  Presented with hypoxia, SOB and cough in the setting of the ongoing 2020-2021 COVID-19 pandemic. The patient received actemra 10/21/2019. He continues to receive IV remdesevir and steroids.  He is on Lovenox. Pt becomes quite hypoxic with conversation. He is on a flutter valve and cough, turn spirometry. Today he requires 5L O2 to maintain saturations in the mid-nineties.   GERD: Continue pantoprazole  CKD IIIa: Monitor creatinine, electrolytes, and volume status. Avoid nephrotoxic substances and hypotension.    I have seen and examined this patient myself. I have spent 34 minutes in his evaluation and care.   DVT Prophylaxis: Lovenox CODE STATUS: Full Code Family Communication: None available Disposition: The patient is from home and admitted to inpatient status. Anticipate that he will discharge to home. Barriers to discharge include  continued hypoxia and tachycardia with conversation of activity. He is not cleared for discahrge.  Anthony Bednarczyk, DO [facility] Direct contact: see [email or web address]  7PM-7AM contact night coverage as above 10/22/2019, 4:01 PM  LOS: 4 days

## 2019-10-23 ENCOUNTER — Ambulatory Visit: Payer: Medicare Other

## 2019-10-23 LAB — CBC WITH DIFFERENTIAL/PLATELET
Abs Immature Granulocytes: 0.34 10*3/uL — ABNORMAL HIGH (ref 0.00–0.07)
Basophils Absolute: 0.1 10*3/uL (ref 0.0–0.1)
Basophils Relative: 1 %
Eosinophils Absolute: 0 10*3/uL (ref 0.0–0.5)
Eosinophils Relative: 0 %
HCT: 46.2 % (ref 39.0–52.0)
Hemoglobin: 14.4 g/dL (ref 13.0–17.0)
Immature Granulocytes: 3 %
Lymphocytes Relative: 7 %
Lymphs Abs: 0.9 10*3/uL (ref 0.7–4.0)
MCH: 24.7 pg — ABNORMAL LOW (ref 26.0–34.0)
MCHC: 31.2 g/dL (ref 30.0–36.0)
MCV: 79.1 fL — ABNORMAL LOW (ref 80.0–100.0)
Monocytes Absolute: 0.5 10*3/uL (ref 0.1–1.0)
Monocytes Relative: 5 %
Neutro Abs: 10.1 10*3/uL — ABNORMAL HIGH (ref 1.7–7.7)
Neutrophils Relative %: 84 %
Platelets: 170 10*3/uL (ref 150–400)
RBC: 5.84 MIL/uL — ABNORMAL HIGH (ref 4.22–5.81)
RDW: 15.2 % (ref 11.5–15.5)
WBC: 11.9 10*3/uL — ABNORMAL HIGH (ref 4.0–10.5)
nRBC: 0 % (ref 0.0–0.2)

## 2019-10-23 LAB — CULTURE, BLOOD (ROUTINE X 2)
Culture: NO GROWTH
Culture: NO GROWTH
Special Requests: ADEQUATE
Special Requests: ADEQUATE

## 2019-10-23 LAB — COMPREHENSIVE METABOLIC PANEL
ALT: 50 U/L — ABNORMAL HIGH (ref 0–44)
AST: 30 U/L (ref 15–41)
Albumin: 3.1 g/dL — ABNORMAL LOW (ref 3.5–5.0)
Alkaline Phosphatase: 66 U/L (ref 38–126)
Anion gap: 8 (ref 5–15)
BUN: 40 mg/dL — ABNORMAL HIGH (ref 8–23)
CO2: 24 mmol/L (ref 22–32)
Calcium: 8.5 mg/dL — ABNORMAL LOW (ref 8.9–10.3)
Chloride: 106 mmol/L (ref 98–111)
Creatinine, Ser: 1.29 mg/dL — ABNORMAL HIGH (ref 0.61–1.24)
GFR calc Af Amer: >60 mL/min (ref >60)
GFR calc non Af Amer: 57 mL/min — ABNORMAL LOW (ref >60)
Glucose, Bld: 133 mg/dL — ABNORMAL HIGH (ref 70–99)
Potassium: 4.8 mmol/L (ref 3.5–5.1)
Sodium: 138 mmol/L (ref 135–145)
Total Bilirubin: 1 mg/dL (ref 0.3–1.2)
Total Protein: 6.3 g/dL — ABNORMAL LOW (ref 6.5–8.1)

## 2019-10-23 LAB — D-DIMER, QUANTITATIVE: D-Dimer, Quant: 1.36 ug/mL-FEU — ABNORMAL HIGH (ref 0.00–0.50)

## 2019-10-23 LAB — LACTATE DEHYDROGENASE: LDH: 353 U/L — ABNORMAL HIGH (ref 98–192)

## 2019-10-23 LAB — C-REACTIVE PROTEIN: CRP: 2.5 mg/dL — ABNORMAL HIGH (ref <1.0)

## 2019-10-23 LAB — MAGNESIUM: Magnesium: 2.6 mg/dL — ABNORMAL HIGH (ref 1.7–2.4)

## 2019-10-23 LAB — FERRITIN: Ferritin: 121 ng/mL (ref 24–336)

## 2019-10-23 MED ORDER — BISACODYL 10 MG RE SUPP
10.0000 mg | Freq: Once | RECTAL | Status: AC
  Administered 2019-10-23: 10 mg via RECTAL
  Filled 2019-10-23: qty 1

## 2019-10-23 MED ORDER — METOPROLOL TARTRATE 12.5 MG HALF TABLET
12.5000 mg | ORAL_TABLET | Freq: Two times a day (BID) | ORAL | Status: DC
  Administered 2019-10-23 – 2019-10-25 (×4): 12.5 mg via ORAL
  Filled 2019-10-23 (×5): qty 1

## 2019-10-23 NOTE — Progress Notes (Signed)
Physical Therapy Treatment Patient Details Name: Anthony King MRN: [id number] DOB: 07/03/1950 Today's Date: 10/23/2019    History of Present Illness Anthony King is a 69 y.o. M with CKD IIIa, GERD who presented with 1 week progressive SOB and hypoxia to 90% at home. In the ER, CXR showed bilateral infiltrates and patient was hypoxic. Pt is positive for covid19.    PT Comments    The patient is sitting on bed edge with CNA, SPO2 on 5 L  [id number]%(finger sensor) RR 38. Placed on NRB in addition, knowing patient desaturates. Patient required extra time to get up to [facility], sat x 10 minutes than stood at RW, SPO2 remained >95% on 5 L HFNC and NRB.Marland Kitchen HR  In 90's.  Patient encouraged to [place] IS and flutter but does not want to do so due to difficulty. Continue  Progressive mobility/ weaning O2 as tolerated.   Follow Up Recommendations  Home health PT;Supervision - Intermittent     Equipment Recommendations  Other (comment)    Recommendations for Other Services       Precautions / Restrictions Precautions Precaution Comments: monitor O2- try ear probe, gets anxious and likes the NRB also    Mobility  Bed Mobility   Bed Mobility: Sit to Supine     Supine to sit: Supervision Sit to supine: Supervision   General bed mobility comments: sitting on the bed edge with CNA resent. Extra time torest  in transitions.Patient returned to supine without physical assistance  Transfers Overall transfer level: Needs assistance Equipment used: None;Rolling walker (2 wheeled) Transfers: Sit to/from [facility] Sit to Stand: Min assist;From elevated surface Stand pivot transfers: Mod assist       General transfer comment: Placed on NRB and 5 L HFNC for activity,patient standing up with mod assist and pivot to [facility]. then used RW to stand from [facility] and took steps to bed after standing up for about 1 minute.  Ambulation/Gait   Gait Distance (Feet): (then 80')   Gait  Pattern/deviations: Step-through pattern         Stairs             Wheelchair Mobility    Modified Rankin (Stroke Patients Only)       Balance                                            Cognition   Behavior During Therapy: Anxious Overall Cognitive Status: Within Functional Limits for tasks assessed                                        Exercises      General Comments        Pertinent Vitals/Pain Faces Pain Scale: No hurt    Home Living                      Prior Function            PT Goals (current goals can now be found in the care plan section) Progress towards PT goals: Progressing toward goals    Frequency    Min 3X/week      PT Plan Current plan remains appropriate    Co-evaluation  AM-PAC PT "6 Clicks" Mobility   Outcome Measure  Help needed turning from your back to your side while in a flat bed without using bedrails?: None Help needed moving from lying on your back to sitting on the side of a flat bed without using bedrails?: None Help needed moving to and from a bed to a chair (including a wheelchair)?: A Little Help needed standing up from a chair using your arms (e.g., wheelchair or bedside chair)?: A Lot Help needed to walk in hospital room?: A Lot Help needed climbing 3-5 steps with a railing? : Total 6 Click Score: 16    End of Session Equipment Utilized During Treatment: Oxygen Activity Tolerance: Patient limited by fatigue Patient left: in bed;with call bell/phone within reach Nurse Communication: Mobility status;Other (comment) PT Visit Diagnosis: Unsteadiness on feet (R26.81);Other abnormalities of gait and mobility (R26.89)     Time: 1007-1040 PT Time Calculation (min) (ACUTE ONLY): 33 min  Charges:  $Therapeutic Activity: 23-37 mins                     Anthony King PT Acute Rehabilitation Services Pager [phone] Office  [phone]    Anthony King 10/23/2019, 2:00 PM

## 2019-10-23 NOTE — Progress Notes (Signed)
MD notified that patient having increased PVC's on the monitor this a.m.  Having intermittent Bigeminy.  Orders placed. Andre Lefort

## 2019-10-23 NOTE — Care Management Important Message (Signed)
Important Message  Patient Details IM Letter given RN to present to the Patient Name: Anthony King MRN: [id number] Date of Birth: March 03, 1951   Medicare Important Message Given:  Yes     Kerin Salen 10/23/2019, 10:45 AM

## 2019-10-23 NOTE — Progress Notes (Signed)
PROGRESS NOTE  Anthony King [id number] DOB: 03-30-1951 DOA: 10/18/2019 PCP: Ann Held, DO  Brief History   Anthony King is a 69 y.o. M with CKD IIIa, GERD who presented with 1 week progressive SOB and hypoxia to 90% at home. In the ER, CXR showed bilateral infiltrates and patient was hypoxic. He was found to be positive for COVID-19.   [facility] were consulted to admit the patient for further evaluation and care. He is receiving IV remdesevir and steroids, as well as subcutaneous lovenox. He also continue to have IV Lasix.   Consultants  . None  Procedures  . None  Antibiotics   Anti-infectives (From admission, onward)   Start     Dose/Rate Route Frequency Ordered Stop   10/19/19 1000  remdesivir 100 mg in sodium chloride 0.9 % 100 mL IVPB     100 mg 200 mL/hr over 30 Minutes Intravenous Daily 10/18/19 1012 10/22/19 0853   10/18/19 1100  remdesivir 100 mg in sodium chloride 0.9 % 100 mL IVPB     100 mg 200 mL/hr over 30 Minutes Intravenous  Once 10/18/19 1011 10/18/19 1153   10/18/19 1030  remdesivir 100 mg in sodium chloride 0.9 % 100 mL IVPB     100 mg 200 mL/hr over 30 Minutes Intravenous  Once 10/18/19 1011 10/18/19 1117      Subjective  The patient is resting in bed. He has dyspnea and hypoxia with conversation.   Objective   Vitals:  Vitals:   10/23/19 0508 10/23/19 1433  BP: 137/83 114/60  Pulse:  (!) 51  Resp: 20 (!) 24  Temp: 98.2 F (36.8 C) 97.9 F (36.6 C)  SpO2:  94%   Exam:  Constitutional:  . The patient is awake, alert, and oriented x 3. No acute distress. Respiratory:  . No increased work of breathing. . No wheezes, rales, or rhonchi . No tactile fremitus . Poor air entry. Cardiovascular:  . Regular rate and rhythm . No murmurs, ectopy, or gallups. . No lateral PMI. No thrills. Abdomen:  . Abdomen is soft, non-tender, non-distended . No hernias, masses, or organomegaly . Normoactive bowel sounds.   Musculoskeletal:  . No cyanosis, clubbing, or edema Skin:  . No rashes, lesions, ulcers . palpation of skin: no induration or nodules Neurologic:  . CN 2-12 intact . Sensation all 4 extremities intact Psychiatric:  . Mental status o Mood, affect appropriate o Orientation to person, place, time  . judgment and insight appear intact I have personally reviewed the following:   Today's Data  . Vitals, CMP, magnesium, and CBC .  Micro Data  . Blood cultures have had no growth in 4 days.  Cardiology Data  . EKG  Scheduled Meds: . enoxaparin (LOVENOX) injection  40 mg Subcutaneous Q24H  . loratadine  10 mg Oral Daily  . methylPREDNISolone (SOLU-MEDROL) injection  40 mg Intravenous Q12H  . metoprolol tartrate  12.5 mg Oral BID  . multivitamin with minerals  1 tablet Oral Daily  . pantoprazole  40 mg Oral Daily  . vitamin B-12  1,000 mcg Oral Daily   Continuous Infusions:  Principal Problem:   Pneumonia due to COVID-19 virus Active Problems:   CKD (chronic kidney disease), stage III   Hypoxia   LOS: 5 days   A & P  Coronavirus pneumonitis with acute hypoxic respiratory failure:  Presented with hypoxia, SOB and cough in the setting of the ongoing 2020-2021 COVID-19 pandemic. The patient received actemra  10/21/2019. He continues to receive IV remdesevir and steroids. He is on Lovenox. Pt becomes quite hypoxic with conversation. He is on a flutter valve and cough, turn spirometry. Today he continues to require 5L O2 to maintain saturations in the low to mid-nineties.   GERD: Continue pantoprazole  CKD IIIa: Monitor creatinine, electrolytes, and volume status. Avoid nephrotoxic substances and hypotension.   I have seen and examined this patient myself. I have spent 34 minutes in his evaluation and care.   DVT Prophylaxis: Lovenox CODE STATUS: Full Code Family Communication: None available Disposition: The patient is from home and admitted to inpatient status. Anticipate  that he will discharge to home with home health PT/OT as recommended. Barriers to discharge include continued hypoxia and tachycardia with conversation of activity. He is not cleared for discahrge.  Auriel Kist, DO [facility] Direct contact: see [email or web address]  7PM-7AM contact night coverage as above 10/23/2019, 3:10 PM  LOS: 4 days

## 2019-10-24 LAB — LACTATE DEHYDROGENASE: LDH: 392 U/L — ABNORMAL HIGH (ref 98–192)

## 2019-10-24 NOTE — Progress Notes (Signed)
PROGRESS NOTE  SANDEEP OLDS [id number] DOB: [birth year] DOA: 10/18/2019 PCP: Ann Held, DO  Brief History   Mr. Lazo is a 69 y.o. M with CKD IIIa, GERD who presented with 1 week progressive SOB and hypoxia to 90% at home. In the ER, CXR showed bilateral infiltrates and patient was hypoxic. He was found to be positive for COVID-19.   [facility] were consulted to admit the patient for further evaluation and care. He is receiving IV remdesevir and steroids, as well as subcutaneous lovenox. He received IV lasix for a few days. This has now been stopped.   Consultants  . None  Procedures  . None  Antibiotics   Anti-infectives (From admission, onward)   Start     Dose/Rate Route Frequency Ordered Stop   10/19/19 1000  remdesivir 100 mg in sodium chloride 0.9 % 100 mL IVPB     100 mg 200 mL/hr over 30 Minutes Intravenous Daily 10/18/19 1012 10/22/19 0853   10/18/19 1100  remdesivir 100 mg in sodium chloride 0.9 % 100 mL IVPB     100 mg 200 mL/hr over 30 Minutes Intravenous  Once 10/18/19 1011 10/18/19 1153   10/18/19 1030  remdesivir 100 mg in sodium chloride 0.9 % 100 mL IVPB     100 mg 200 mL/hr over 30 Minutes Intravenous  Once 10/18/19 1011 10/18/19 1117      Subjective  The patient is resting in bed. He states that he is feeling better.   Objective   Vitals:  Vitals:   10/24/19 0525 10/24/19 1357  BP: 121/74 121/77  Pulse: (!) 51 60  Resp: 19 16  Temp: 98 F (36.7 C) 98 F (36.7 C)  SpO2: 93% 98%   Exam:  Constitutional:  . The patient is awake, alert, and oriented x 3. No acute distress. Respiratory:  . No increased work of breathing. . No wheezes, rales, or rhonchi . No tactile fremitus . Poor air entry. Cardiovascular:  . Regular rate and rhythm . No murmurs, ectopy, or gallups. . No lateral PMI. No thrills. Abdomen:  . Abdomen is soft, non-tender, non-distended . No hernias, masses, or  organomegaly . Normoactive bowel sounds.  Musculoskeletal:  . No cyanosis, clubbing, or edema Skin:  . No rashes, lesions, ulcers . palpation of skin: no induration or nodules Neurologic:  . CN 2-12 intact . Sensation all 4 extremities intact Psychiatric:  . Mental status o Mood, affect appropriate o Orientation to person, place, time  . judgment and insight appear intact I have personally reviewed the following:   Today's Data  . Vitals, CMP, magnesium, and CBC .  Micro Data  . Blood cultures have had no growth in 4 days.  Cardiology Data  . EKG  Scheduled Meds: . enoxaparin (LOVENOX) injection  40 mg Subcutaneous Q24H  . loratadine  10 mg Oral Daily  . methylPREDNISolone (SOLU-MEDROL) injection  40 mg Intravenous Q12H  . metoprolol tartrate  12.5 mg Oral BID  . multivitamin with minerals  1 tablet Oral Daily  . pantoprazole  40 mg Oral Daily  . vitamin B-12  1,000 mcg Oral Daily   Continuous Infusions:  Principal Problem:   Pneumonia due to COVID-19 virus Active Problems:   CKD (chronic kidney disease), stage III   Hypoxia   LOS: 6 days   A & P  Coronavirus pneumonitis with acute hypoxic respiratory failure:  Presented with hypoxia, SOB and cough in the setting of the ongoing 2020-2021 COVID-19  pandemic. The patient received actemra 10/21/2019. He continues to receive IV remdesevir and steroids. He is on Lovenox. Pt becomes quite hypoxic with conversation. He is on a flutter valve and cough, turn spirometry. Today he continues to require 3L O2 to maintain saturations in the low to mid-nineties at rest and 6L by HFNC with any exertion.   GERD: Continue pantoprazole  CKD IIIa: Monitor creatinine, electrolytes, and volume status. Avoid nephrotoxic substances and hypotension.   I have seen and examined this patient myself. I have spent 34 minutes in his evaluation and care.   DVT Prophylaxis: Lovenox CODE STATUS: Full Code Family Communication: None  available Disposition: The patient is from home and admitted to inpatient status. Anticipate that he will discharge to home with home health PT/OT as recommended. Barriers to discharge include continued continued hypoxia high O2 requirements with exertion. For this reason he is not cleared for discahrge.  Kyshawn Teal, DO [facility] Direct contact: see [email or web address]  7PM-7AM contact night coverage as above 10/24/2019, 4:44 PM  LOS: 4 days

## 2019-10-24 NOTE — Progress Notes (Signed)
Occupational Therapy Evaluation Patient Details Name: Anthony King MRN: [id number] DOB: [birth year] Today's Date: 10/24/2019    History of Present Illness Anthony King is a 69 y.o. M with CKD IIIa, GERD who presented with 1 week progressive SOB and hypoxia to 90% at home. In the ER, CXR showed bilateral infiltrates and patient was hypoxic. Pt is positive for covid19.   Clinical Impression   PTA, pt lived independently with his wife and enjoyed spending time outside on his tractor and playing with his grandchildren. On entry to room, pt tearful stating he was "going home" and "I don't know what is going on..If I'm getting any better". Pt on 3L with SpO2 87 in bed. Ear probe placed and SpO2 reading 91. Ambulated to bathroom with HHA min A due to unsteadiness to complete sink level ADL. Required 6L HFNC during activity (completed mostly in seated position) and multiple rest breaks to maintain SpO2 in low 90s/high 80s and decrease WOB. Educated on pursed lip breathing and pacing self during ADL. Educated pt on importance of spending more time OOB in chair and completing his flutter valve and incentive spirometer exercises (pt able to pull 740ml) - pt verbalized understanding. Also discussed importance of spending time prone if able - pt verbalized understanding. Encourage ambulation with nsg and completion of HEP. Will follow acutely to facilitate safe DC home with HHOT. Pt very appreciative.     Follow Up Recommendations  Home health OT;Supervision - Intermittent(pending progress)    Equipment Recommendations  None recommended by OT    Recommendations for Other Services       Precautions / Restrictions Precautions Precautions: Fall Precaution Comments: monitor O2; has ear probe      Mobility Bed Mobility Overal bed mobility: Modified Independent Bed Mobility: Supine to Sit     Supine to sit: Modified independent (Device/Increase time)        Transfers Overall transfer  level: Needs assistance Equipment used: 1 person hand held assist Transfers: Sit to/from [facility] Sit to Stand: Min assist Stand pivot transfers: Min assist       General transfer comment: Patient ambulated to bathroom with hand held assist. patient initially unsteady but no overt loss of balance. Patient placed on 4L of oxygen for ambulation.    Balance Overall balance assessment: Needs assistance Sitting-balance support: Feet supported;No upper extremity supported Sitting balance-Leahy Scale: Good     Standing balance support: Single extremity supported Standing balance-Leahy Scale: Poor - reaching for stability at all times; may do well with RW                             ADL either performed or assessed with clinical judgement   ADL Overall ADL's : Needs assistance/impaired Eating/Feeding: Independent   Grooming: Set up;Sitting(@ sink) Grooming Details (indicate cue type and reason): Patient performed grooming sitting in front of sink. Upper Body Bathing: Set up;Sitting   Lower Body Bathing: Minimal assistance;Sit to/from stand   Upper Body Dressing : Set up;Sitting   Lower Body Dressing: Minimal assistance;Sit to/from stand Lower Body Dressing Details (indicate cue type and reason): assistance for donning underwear to limit exertion and maintain optimal oxygenation Toilet Transfer: Minimal assistance;Ambulation   Toileting- Clothing Manipulation and Hygiene: Min guard;Sit to/from stand       Functional mobility during ADLs: Minimal assistance(HHA) General ADL Comments: Ambulated to bathroom to complete ADL @ sink level. Multiple rest breaks required due to increased WOB  and desat into 80s. Increased SpO2 from 4L to 6L during ADL. VC required throughout for pacing and pursed lip breathing. Pt began coughing episode x 1 during ADL with increased RR - flutter valve used to slow RR - good response     Vision Baseline Vision/History: Wears  glasses Wears Glasses: At all times Vision Assessment?: No apparent visual deficits     Perception     Praxis      Pertinent Vitals/Pain Pain Assessment: No/denies pain     Hand Dominance Right   Extremity/Trunk Assessment Upper Extremity Assessment Upper Extremity Assessment: Generalized weakness   Lower Extremity Assessment Lower Extremity Assessment: Defer to PT evaluation   Cervical / Trunk Assessment Cervical / Trunk Assessment: Normal   Communication Communication Communication: No difficulties   Cognition Arousal/Alertness: Awake/alert Behavior During Therapy: Anxious(at times) Overall Cognitive Status: Within Functional Limits for tasks assessed                                     General Comments  Pt tearful at times when talking about wanting to go home;missing his family    Exercises Exercises: Other exercises Other Exercises Other Exercises: chair marches x 5 Other Exercises: reviewed Covid HEP with pt. Pt has theraband in room Other Exercises: completed flutter valve x 10 Other Exercises: incentive spirometer x 5 - able to pull 729ml   Shoulder Instructions      Home Living Family/patient expects to be discharged to:: Private residence Living Arrangements: Spouse/significant other Available Help at Discharge: Family;Available 24 hours/day Type of Home: House Home Access: Level entry     Home Layout: One level     Bathroom Shower/Tub: Occupational psychologist: Standard Bathroom Accessibility: Yes How Accessible: Accessible via walker Home Equipment: [place] - 2 wheels;Cane - single point;Shower seat;Bedside commode          Prior Functioning/Environment Level of Independence: Independent        Comments: Pt reports he is Ind with ADLs and ambulates unlimited distances without AD. 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 Pt reports spouse is at home with him and she is normally  independent, but currently is sick with covid although she is taking care of her grandchildren today        OT Problem List: Decreased strength;Decreased activity tolerance;Impaired balance (sitting and/or standing);Decreased safety awareness;Decreased knowledge of use of DME or AE;Cardiopulmonary status limiting activity      OT Treatment/Interventions: Self-care/ADL training;Therapeutic exercise;Neuromuscular education;Energy conservation;DME and/or AE instruction;Therapeutic activities;Patient/family education    OT Goals(Current goals can be found in the care plan section) Acute Rehab OT Goals Patient Stated Goal: get home to wife and grandkids OT Goal Formulation: With patient Time For Goal Achievement: 11/07/19 Potential to Achieve Goals: Good  OT Frequency: Min 2X/week   Barriers to D/C:            Co-evaluation              AM-PAC OT "6 Clicks" Daily Activity     Outcome Measure Help from another person eating meals?: None Help from another person taking care of personal grooming?: A Little Help from another person toileting, which includes using toliet, bedpan, or urinal?: A Little Help from another person bathing (including washing, rinsing, drying)?: A Little Help from another person to put on and taking off regular upper body clothing?: A  Little Help from another person to put on and taking off regular lower body clothing?: A Little 6 Click Score: 19   End of Session Equipment Utilized During Treatment: Oxygen(3-6L O2) Nurse Communication: Mobility status;Other (comment)(need to increase time OOB in chair and use IS and flutter va)  Activity Tolerance: Patient tolerated treatment well Patient left: in chair;with call bell/phone within reach;with chair alarm set  OT Visit Diagnosis: Unsteadiness on feet (R26.81);Muscle weakness (generalized) (M62.81)                Time: 1000-1058 OT Time Calculation (min): 58 min Charges:  OT General Charges $OT Visit: 1  Visit OT Evaluation $OT Eval Moderate Complexity: 1 Mod OT Treatments $Self Care/Home Management : 23-37 mins $Therapeutic Exercise: 8-22 mins  Maurie Boettcher, OT/L   Acute OT Clinical Specialist [place] Pager [phone] Office [phone]   [facility] 10/24/2019, 11:26 AM

## 2019-10-24 NOTE — TOC Progression Note (Signed)
Transition of Care [facility]) - Progression Note    Patient Details  Name: Anthony King MRN: [id number] Date of Birth: 05/04/1951  [facility] of [facility]) CM/SW Contact  Purcell Mouton, RN Phone Number: 10/24/2019, 2:50 PM  Clinical Narrative:    Spoke with pt concerning discharge plans and [place]. [facility] was selected and in house rep [name] was called. Barrier to discharge: pt on HFNC 6L O2.    Expected Discharge Plan: Home/Self Care Barriers to Discharge: No Barriers Identified  Expected Discharge Plan and Services Expected Discharge Plan: Home/Self Care       Living arrangements for the past 2 months: Single Family Home                                       Social Determinants of Health (SDOH) Interventions    Readmission Risk Interventions No flowsheet data found.

## 2019-10-24 NOTE — Progress Notes (Signed)
CCMD called said pt showing no tele data on monitor.  Went to room,monitor beeping dead battery, plugged in came back on line.

## 2019-10-24 NOTE — Plan of Care (Signed)
  Problem: Education: Goal: Knowledge of risk factors and measures for prevention of condition will improve Outcome: Progressing   Problem: Coping: Goal: Psychosocial and spiritual needs will be supported Outcome: Progressing   Problem: Respiratory: Goal: Will maintain a patent airway Outcome: Progressing Goal: Complications related to the disease process, condition or treatment will be avoided or minimized Outcome: Progressing   

## 2019-10-24 NOTE — Progress Notes (Signed)
SATURATION QUALIFICATIONS: (This note is used to comply with regulatory documentation for home oxygen)  Patient Saturations on Room Air at Rest = 93%  Patient Saturations on Room Air while Ambulating = 79%  Patient Saturations on 3 Liters of HFNC oxygen while Ambulating = 90%  Please briefly explain why patient needs home oxygen: Oxygen level drops into high 70s/low 80s on RA

## 2019-10-24 NOTE — Progress Notes (Signed)
Patient continues to require 3L O2 via [place] with Non-breather to recover post exertion despite extensive pulmonary toileting.

## 2019-10-25 LAB — BASIC METABOLIC PANEL
Anion gap: 7 (ref 5–15)
Anion gap: 9 (ref 5–15)
BUN: 43 mg/dL — ABNORMAL HIGH (ref 8–23)
BUN: 42 mg/dL — ABNORMAL HIGH (ref 8–23)
CO2: 25 mmol/L (ref 22–32)
CO2: 22 mmol/L (ref 22–32)
Calcium: 8.8 mg/dL — ABNORMAL LOW (ref 8.9–10.3)
Calcium: 8.4 mg/dL — ABNORMAL LOW (ref 8.9–10.3)
Chloride: 104 mmol/L (ref 98–111)
Chloride: 105 mmol/L (ref 98–111)
Creatinine, Ser: 1.32 mg/dL — ABNORMAL HIGH (ref 0.61–1.24)
Creatinine, Ser: 1.38 mg/dL — ABNORMAL HIGH (ref 0.61–1.24)
GFR calc Af Amer: >60 mL/min (ref >60)
GFR calc Af Amer: >60 mL/min (ref >60)
GFR calc non Af Amer: 55 mL/min — ABNORMAL LOW (ref >60)
GFR calc non Af Amer: 52 mL/min — ABNORMAL LOW (ref >60)
Glucose, Bld: 114 mg/dL — ABNORMAL HIGH (ref 70–99)
Glucose, Bld: 153 mg/dL — ABNORMAL HIGH (ref 70–99)
Potassium: 5.2 mmol/L — ABNORMAL HIGH (ref 3.5–5.1)
Potassium: 4.8 mmol/L (ref 3.5–5.1)
Sodium: 136 mmol/L (ref 135–145)
Sodium: 136 mmol/L (ref 135–145)

## 2019-10-25 LAB — CBC WITH DIFFERENTIAL/PLATELET
Abs Immature Granulocytes: 1.03 10*3/uL — ABNORMAL HIGH (ref 0.00–0.07)
Basophils Absolute: 0.1 10*3/uL (ref 0.0–0.1)
Basophils Relative: 1 %
Eosinophils Absolute: 0 10*3/uL (ref 0.0–0.5)
Eosinophils Relative: 0 %
HCT: 48.8 % (ref 39.0–52.0)
Hemoglobin: 15.2 g/dL (ref 13.0–17.0)
Immature Granulocytes: 7 %
Lymphocytes Relative: 10 %
Lymphs Abs: 1.5 10*3/uL (ref 0.7–4.0)
MCH: 24.7 pg — ABNORMAL LOW (ref 26.0–34.0)
MCHC: 31.1 g/dL (ref 30.0–36.0)
MCV: 79.2 fL — ABNORMAL LOW (ref 80.0–100.0)
Monocytes Absolute: 0.7 10*3/uL (ref 0.1–1.0)
Monocytes Relative: 5 %
Neutro Abs: 11.7 10*3/uL — ABNORMAL HIGH (ref 1.7–7.7)
Neutrophils Relative %: 77 %
Platelets: 290 10*3/uL (ref 150–400)
RBC: 6.16 MIL/uL — ABNORMAL HIGH (ref 4.22–5.81)
RDW: 17.2 % — ABNORMAL HIGH (ref 11.5–15.5)
WBC: 15 10*3/uL — ABNORMAL HIGH (ref 4.0–10.5)
nRBC: 0 % (ref 0.0–0.2)

## 2019-10-25 LAB — LACTATE DEHYDROGENASE: LDH: 335 U/L — ABNORMAL HIGH (ref 98–192)

## 2019-10-25 MED ORDER — DEXAMETHASONE 6 MG PO TABS
6.0000 mg | ORAL_TABLET | Freq: Every day | ORAL | 0 refills | Status: AC
Start: 2019-10-25 — End: 2019-10-29

## 2019-10-25 NOTE — Discharge Summary (Signed)
Physician Discharge Summary  Anthony King [id number] DOB: [birth year] DOA: 10/18/2019  PCP: Ann Held, DO  Admit date: 10/18/2019 Discharge date: 10/25/2019  Admitted From: home Disposition:  home  Recommendations for Outpatient Follow-up:  1. Follow up with PCP in 1-2 weeks 2. Please obtain BMP/CBC in one week 3. Please follow up on the following pending results:  Home Health:Yes  Equipment/Devices: none  Discharge Condition: Stable Code Status: FULL Diet recommendation: Heart Healthy  Brief/Interim Summary: 69 y.o.M with CKD IIIa, GERD who presented with 1 week progressive SOB and hypoxia to 90% at home. In the ER, CXR showed bilateral infiltrates and patient was hypoxic. He was found to be positive for COVID-19.  [facility] were consulted to admit the patient for further evaluation and care.  Patient completed  IV remdesevir x 5 days and on steroids.He received IV lasix for a few days. This has now been stopped.   Was managed with current Covid protocol.  He has not significantly improved.  Today he was able to, with oxygen on room air saturating 90% on ambulation on room air 86% placed on 2 L nasal cannula and improved to 94%. He denies any shortness of breath chest pain nausea vomiting fever chills.  He is tolerating diet well. He is anxious and wants to go home today. Reports his wife and other family numbers were tested positive but they are doing fine. Plan of care was discussed with the patient's wife okay for discharge home today. Home health PT and home o2 will be set up on discharge.  Discharge Diagnoses:  Assessment & Plan: Hypoxic respiratory failure due to COVID-19 pneumonia /pneumonia due to COVID-19 virus: Completed remdesivir on 4/25.he was on high flow nasal cannula and has been weaned down to 2 L and today came off the oxygen and doing well on room air but needing oxygen while on ambulation.  He feels fine is persistent on going  home today.  We will discharge him on oral Decadron to complete the course, and also home oxygen and home health PT.  He needs to continue with the 21 days of quarantine since disease onset  Recent Labs    10/23/19 0405 10/24/19 0129 10/25/19 0630  DDIMER 1.36*  --   --   FERRITIN 121  --   --   LDH 353* 392* 335*  CRP 2.5*  --   --     Lab Results  Component Value Date   SARSCOV2NAA RESULT: NEGATIVE 09/12/2019   SARSCOV2NAA NOT DETECTED 04/15/2019   CKD (chronic kidney disease), stage IIIa:stable. Recent Labs  Lab 10/20/19 0206 10/21/19 0204 10/22/19 0412 10/23/19 0405 10/25/19 0630  BUN 43* 46* 47* 40* 43*  CREATININE 1.63* 1.46* 1.46* 1.29* 1.32*   Hyperkalemia  At 5.2- rechekc BMP prior to d/c came back improved to potassium 4.8. Avoid supplementation monitor.  Leukocytosis, but afebrile.  likely from the steroid. Recent Labs  Lab 10/20/19 0206 10/21/19 0204 10/22/19 0412 10/23/19 0405 10/25/19 0630  WBC 12.4* 10.2 11.5* 11.9* 15.0*    Consults:  none  Subjective: Patient feels fine , came off the oxygen this morning doing well.  Wants to go home.  Discharge Exam: Vitals:   10/25/19 1100 10/25/19 1413  BP:  118/76  Pulse:  (!) 57  Resp:  20  Temp:  97.8 F (36.6 C)  SpO2: 100% 91%   General: Pt is alert, awake, not in acute distress Cardiovascular: RRR, S1/S2 +, no rubs, no gallops  Respiratory: CTA bilaterally, no wheezing, no rhonchi Abdominal: Soft, NT, ND, bowel sounds + Extremities: no edema, no cyanosis  Discharge Instructions  Discharge Instructions    Call MD for:   Complete by: As directed    Please call call MD or return to ER for similar or worsening recurring problem that brought you to hospital or if any fever,nausea/vomiting,abdominal pain, uncontrolled pain, chest pain,  shortness of breath or any other alarming symptoms.  Please follow-up your doctor as instructed in a week time and call the office for appointment.  Please  avoid alcohol, smoking, or any other illicit substance and maintain healthy habits including taking your regular medications as prescribed.  You were cared for by a hospitalist during your hospital stay. If you have any questions about your discharge medications or the care you received while you were in the hospital after you are discharged, you can call the unit and ask to speak with the hospitalist on call if the hospitalist that took care of you is not available.  Once you are discharged, your primary care physician will handle any further medical issues. Please note that NO REFILLS for any discharge medications will be authorized once you are discharged, as it is imperative that you return to your primary care physician (or establish a relationship with a primary care physician if you do not have one) for your aftercare needs so that they can reassess your need for medications and monitor your lab values   Diet - low sodium heart healthy   Complete by: As directed    Discharge instructions   Complete by: As directed    You may not return to work/leave the home until at least 21 days since symptom onset AND 3 days without a fever (without taking tylenol, ibuprofen, etc.) and without a cough.  Please return to work only after you have been cleared to do so by your primary care physician.  Directions for you at home:  Wear a facemask - For the next 2 weeks  You should wear a facemask that covers your nose and mouth when you are in the same room with other people and when you visit a healthcare provider. People who live with or visit you should also wear a facemask while they are in the same room with you.  Separate yourself from other people in your home As much as possible, you should stay in a different room from other people in your home. Also, you should use a separate bathroom, if available.  Avoid sharing household items You should not share dishes, drinking glasses, cups, eating  utensils, towels, bedding, or other items with other people in your home. After using these items, you should wash them thoroughly with soap and water.  Cover your coughs and sneezes Cover your mouth and nose with a tissue when you cough or sneeze, or you can cough or sneeze into your sleeve. Throw used tissues in a lined trash can, and immediately wash your hands with soap and water for at least 20 seconds or use an alcohol-based hand rub.  Wash your [facility] [employer] your hands often and thoroughly with soap and water for at least 20 seconds. You can use an alcohol-based hand sanitizer if soap and water are not available and if your hands are not visibly dirty. Avoid touching your eyes, nose, and mouth with unwashed hands.  Directions for those who live with, or provide care at home for you:  Limit the number of people who  have contact with the patient If possible, have only one caregiver for the patient. Other household members should stay in another home or place of residence. If this is not possible, they should stay in another room, or be separated from the patient as much as possible. Use a separate bathroom, if available. Restrict visitors who do not have an essential need to be in the home.  Ensure good ventilation Make sure that shared spaces in the home have good air flow, such as from an air conditioner or an opened window, weather permitting.  Wash your hands often Wash your hands often and thoroughly with soap and water for at least 20 seconds. You can use an alcohol based hand sanitizer if soap and water are not available and if your hands are not visibly dirty. Avoid touching your eyes, nose, and mouth with unwashed hands. Use disposable paper towels to dry your hands. If not available, use dedicated cloth towels and replace them when they become wet.  Wear a facemask and gloves Wear a disposable facemask at all times in the room and gloves when you touch or have contact with  the patient's blood, body fluids, and/or secretions or excretions, such as sweat, saliva, sputum, nasal mucus, vomit, urine, or feces.  Ensure the mask fits over your nose and mouth tightly, and do not touch it during use. Throw out disposable facemasks and gloves after using them. Do not reuse. Wash your hands immediately after removing your facemask and gloves. If your personal clothing becomes contaminated, carefully remove clothing and launder. Wash your hands after handling contaminated clothing. Place all used disposable facemasks, gloves, and other waste in a lined container before disposing them with other household waste. Remove gloves and wash your hands immediately after handling these items.  Do not share dishes, glasses, or other household items with the patient Avoid sharing household items. You should not share dishes, drinking glasses, cups, eating utensils, towels, bedding, or other items with a patient who is confirmed to have, or being evaluated for, COVID-19 infection. After the person uses these items, you should wash them thoroughly with soap and water.  Wash laundry thoroughly Immediately remove and wash clothes or bedding that have blood, body fluids, and/or secretions or excretions, such as sweat, saliva, sputum, nasal mucus, vomit, urine, or feces, on them. Wear gloves when handling laundry from the patient. Read and follow directions on labels of laundry or clothing items and detergent. In general, wash and dry with the warmest temperatures recommended on the label.  Clean all areas the individual has used often Clean all touchable surfaces, such as counters, tabletops, doorknobs, bathroom fixtures, toilets, phones, keyboards, tablets, and bedside tables, every day. Also, clean any surfaces that may have blood, body fluids, and/or secretions or excretions on them. Wear gloves when cleaning surfaces the patient has come in contact with. Use a diluted bleach solution  (e.g., dilute bleach with 1 part bleach and 10 parts water) or a household disinfectant with a label that says EPA-registered for coronaviruses. To make a bleach solution at home, add 1 tablespoon of bleach to 1 quart (4 cups) of water. For a larger supply, add  cup of bleach to 1 gallon (16 cups) of water. Read labels of cleaning products and follow recommendations provided on product labels. Labels contain instructions for safe and effective use of the cleaning product including precautions you should take when applying the product, such as wearing gloves or eye protection and making sure you have good  ventilation during use of the product. Remove gloves and wash hands immediately after cleaning.  Monitor yourself for signs and symptoms of illness Caregivers and household members are considered close contacts, should monitor their health, and will be asked to limit movement outside of the home to the extent possible. Follow the monitoring steps for close contacts listed on the symptom monitoring form.   Discharge instructions   Complete by: As directed    You may not return to work/leave the home until at least 21 days since symptom onset AND 3 days without a fever (without taking tylenol, ibuprofen, etc.) and without a cough.  Please return to work only after you have been cleared to do so by your primary care physician.  Directions for you at home:  Wear a facemask - For the next 2 weeks  You should wear a facemask that covers your nose and mouth when you are in the same room with other people and when you visit a healthcare provider. People who live with or visit you should also wear a facemask while they are in the same room with you.  Separate yourself from other people in your home As much as possible, you should stay in a different room from other people in your home. Also, you should use a separate bathroom, if available.  Avoid sharing household items You should not share dishes,  drinking glasses, cups, eating utensils, towels, bedding, or other items with other people in your home. After using these items, you should wash them thoroughly with soap and water.  Cover your coughs and sneezes Cover your mouth and nose with a tissue when you cough or sneeze, or you can cough or sneeze into your sleeve. Throw used tissues in a lined trash can, and immediately wash your hands with soap and water for at least 20 seconds or use an alcohol-based hand rub.  Wash your [employer] your hands often and thoroughly with soap and water for at least 20 seconds. You can use an alcohol-based hand sanitizer if soap and water are not available and if your hands are not visibly dirty. Avoid touching your eyes, nose, and mouth with unwashed hands.  Directions for those who live with, or provide care at home for you:  Limit the number of people who have contact with the patient If possible, have only one caregiver for the patient. Other household members should stay in another home or place of residence. If this is not possible, they should stay in another room, or be separated from the patient as much as possible. Use a separate bathroom, if available. Restrict visitors who do not have an essential need to be in the home.  Ensure good ventilation Make sure that shared spaces in the home have good air flow, such as from an air conditioner or an opened window, weather permitting.  Wash your hands often Wash your hands often and thoroughly with soap and water for at least 20 seconds. You can use an alcohol based hand sanitizer if soap and water are not available and if your hands are not visibly dirty. Avoid touching your eyes, nose, and mouth with unwashed hands. Use disposable paper towels to dry your hands. If not available, use dedicated cloth towels and replace them when they become wet.  Wear a facemask and gloves Wear a disposable facemask at all times in the room and gloves when  you touch or have contact with the patient's blood, body fluids, and/or secretions or excretions,  such as sweat, saliva, sputum, nasal mucus, vomit, urine, or feces.  Ensure the mask fits over your nose and mouth tightly, and do not touch it during use. Throw out disposable facemasks and gloves after using them. Do not reuse. Wash your hands immediately after removing your facemask and gloves. If your personal clothing becomes contaminated, carefully remove clothing and launder. Wash your hands after handling contaminated clothing. Place all used disposable facemasks, gloves, and other waste in a lined container before disposing them with other household waste. Remove gloves and wash your hands immediately after handling these items.  Do not share dishes, glasses, or other household items with the patient Avoid sharing household items. You should not share dishes, drinking glasses, cups, eating utensils, towels, bedding, or other items with a patient who is confirmed to have, or being evaluated for, COVID-19 infection. After the person uses these items, you should wash them thoroughly with soap and water.  Wash laundry thoroughly Immediately remove and wash clothes or bedding that have blood, body fluids, and/or secretions or excretions, such as sweat, saliva, sputum, nasal mucus, vomit, urine, or feces, on them. Wear gloves when handling laundry from the patient. Read and follow directions on labels of laundry or clothing items and detergent. In general, wash and dry with the warmest temperatures recommended on the label.  Clean all areas the individual has used often Clean all touchable surfaces, such as counters, tabletops, doorknobs, bathroom fixtures, toilets, phones, keyboards, tablets, and bedside tables, every day. Also, clean any surfaces that may have blood, body fluids, and/or secretions or excretions on them. Wear gloves when cleaning surfaces the patient has come in contact with. Use  a diluted bleach solution (e.g., dilute bleach with 1 part bleach and 10 parts water) or a household disinfectant with a label that says EPA-registered for coronaviruses. To make a bleach solution at home, add 1 tablespoon of bleach to 1 quart (4 cups) of water. For a larger supply, add  cup of bleach to 1 gallon (16 cups) of water. Read labels of cleaning products and follow recommendations provided on product labels. Labels contain instructions for safe and effective use of the cleaning product including precautions you should take when applying the product, such as wearing gloves or eye protection and making sure you have good ventilation during use of the product. Remove gloves and wash hands immediately after cleaning.  Monitor yourself for signs and symptoms of illness Caregivers and household members are considered close contacts, should monitor their health, and will be asked to limit movement outside of the home to the extent possible. Follow the monitoring steps for close contacts listed on the symptom monitoring form.   Increase activity slowly   Complete by: As directed    Increase activity slowly   Complete by: As directed      Allergies as of 10/25/2019      Reactions   Aspirin Other (See Comments)   REACTION: nauseated, drooling      Medication List    STOP taking these medications   azithromycin 250 MG tablet Commonly known as: Zithromax Z-Pak   predniSONE 10 MG tablet Commonly known as: DELTASONE     TAKE these medications   dexamethasone 6 MG tablet Commonly known as: DECADRON Take 1 tablet (6 mg total) by mouth daily for 4 days.   lansoprazole 30 MG capsule Commonly known as: PREVACID Take 30 mg by mouth daily at 12 noon.   loperamide 2 MG tablet Commonly known as: Imodium  A-D Take 1 tablet (2 mg total) by mouth 4 (four) times daily as needed for diarrhea or loose stools.   loratadine 10 MG tablet Commonly known as: CLARITIN Take 10 mg by mouth daily.    Metamucil Free & Natural 43 % Powd Generic drug: Psyllium Take 2 Units by mouth daily. Metamucil 2 teaspoons   promethazine-dextromethorphan 6.25-15 MG/5ML syrup Commonly known as: PROMETHAZINE-DM Take 5 mLs by mouth 4 (four) times daily as needed.   vitamin B-12 1000 MCG tablet Commonly known as: CYANOCOBALAMIN Take 1,000 mcg by mouth daily.            Durable Medical Equipment  (From admission, onward)         Start     Ordered   10/25/19 1333  DME Oxygen  Once    Comments: Home oxygen for Covid pneumonia with ambulation at 2 L  Question Answer Comment  Length of Need Lifetime   Mode or (Route) Nasal cannula   Liters per Minute 2   Oxygen delivery system Gas      10/25/19 1335   10/24/19 1500  For home use only DME oxygen  Once    Question Answer Comment  Length of Need Lifetime   Mode or (Route) Nasal cannula   Liters per Minute 4   Frequency Continuous (stationary and portable oxygen unit needed)   Oxygen conserving device Yes   Oxygen delivery system Gas      10/24/19 1459         Follow-up Information    [facility] Follow up.   Specialty: Home Health Services Why: Alvis Lemmings will follow at home for Home Health. Please call the above number with any concerns or problems.  Contact information: [address] [place] [address] [phone]        Roma Schanz R, DO Follow up in 1 week(s).   Specialty: Family Medicine Contact information: [phone] [place] [address] [phone]        Burnell Blanks, MD .   Specialty: Cardiology Contact information: [place] [address] [place] [address] [phone]          Allergies  Allergen Reactions  . Aspirin Other (See Comments)    REACTION: nauseated, drooling    The results of significant diagnostics from this hospitalization (including imaging, microbiology, ancillary and laboratory) are listed below for reference.     Microbiology: Recent Results (from the past 240 hour(s))  Blood Culture (routine x 2)     Status: None   Collection Time: 10/18/19  8:20 AM   Specimen: BLOOD  Result Value Ref Range Status   Specimen Description   Final    BLOOD LEFT ANTECUBITAL Performed at [facility], [place] [address], [place], [place] [address]    Special Requests   Final    BOTTLES DRAWN AEROBIC AND ANAEROBIC Blood Culture adequate volume Performed at [facility], [address] [place], [place] [address]    Culture   Final    NO GROWTH 5 DAYS Performed at [facility], [place] [address] [place], [place] [address]    Report Status 10/23/2019 FINAL  Final  Blood Culture (routine x 2)     Status: None   Collection Time: 10/18/19  8:33 AM   Specimen: BLOOD  Result Value Ref Range Status   Specimen Description   Final    BLOOD RIGHT ANTECUBITAL Performed at [facility], [place] [address] [place], [place] [address]    Special Requests   Final    BOTTLES DRAWN AEROBIC AND ANAEROBIC Blood Culture adequate volume Performed at [facility], [address] [place], [place] [address]    Culture   Final    NO GROWTH 5 DAYS Performed at [facility], [place] [address] [place], [place] [address]    Report Status 10/23/2019 FINAL  Final    Procedures/Studies: DG Chest Portable 1 View  Result Date: 10/18/2019 CLINICAL DATA:  New diagnosis of COVID-19. Fever, cough, and shortness of breath. EXAM: PORTABLE CHEST 1 VIEW COMPARISON:  Chest x-ray dated 10/10/2018 FINDINGS: There are faint bilateral pulmonary infiltrates in the periphery of both lungs. Heart size and vascularity are normal. No effusions. No significant bone abnormality. IMPRESSION: Faint bilateral pulmonary infiltrates in the periphery of both lungs. Electronically Signed   By: Lorriane Shire M.D.   On: 10/18/2019 08:32    Labs: BNP (last 3 results) Recent Labs    10/21/19 0559  BNP 171.7*   Basic  Metabolic Panel: Recent Labs  Lab 10/19/19 0138 10/19/19 0138 10/20/19 0206 10/20/19 0206 10/21/19 0204 10/22/19 0412 10/23/19 0405 10/25/19 0630 10/25/19 1405  NA 136   < > 137   < > 137 140 138 136 136  K 3.9   < > 4.6   < > 4.3 4.7 4.8 5.2* 4.8  CL 103   < > 107   < > 106 107 106 104 105  CO2 23   < > 21*   < > 22 25 24 25 22   GLUCOSE 141*   < > 151*   < > 147* 132* 133* 114* 153*  BUN 30*   < > 43*   < > 46* 47* 40* 43* 42*  CREATININE 1.37*   < > 1.63*   < > 1.46* 1.46* 1.29* 1.32* 1.38*  CALCIUM 8.9   < > 8.6*   < > 8.2* 8.6* 8.5* 8.8* 8.4*  MG 2.3  --  2.5*  --  2.6* 2.6* 2.6*  --   --    < > = values in this interval not displayed.   Liver Function Tests: Recent Labs  Lab 10/19/19 0138 10/20/19 0206 10/21/19 0204 10/22/19 0412 10/23/19 0405  AST 34 29 34 42* 30  ALT 27 27 36 56* 50*  ALKPHOS 69 58 57 64 66  BILITOT 0.9 1.0 0.8 1.1 1.0  PROT 7.0 6.1* 6.4* 6.4* 6.3*  ALBUMIN 3.4* 3.0* 3.0* 3.1* 3.1*   No results for input(s): LIPASE, AMYLASE in the last 168 hours. No results for input(s): AMMONIA in the last 168 hours. CBC: Recent Labs  Lab 10/20/19 0206 10/21/19 0204 10/22/19 0412 10/23/19 0405 10/25/19 0630  WBC 12.4* 10.2 11.5* 11.9* 15.0*  NEUTROABS 10.8* 9.0* 9.8* 10.1* 11.7*  HGB 13.0 12.8* 13.6 14.4 15.2  HCT 42.4 41.1 43.9 46.2 48.8  MCV 80.2 78.6* 78.8* 79.1* 79.2*  PLT 139* 114* 172 170 290   Cardiac Enzymes: No results for input(s): CKTOTAL, CKMB, CKMBINDEX, TROPONINI in the last 168 hours. BNP: Invalid input(s): POCBNP CBG: No results for input(s): GLUCAP in the last 168 hours. D-Dimer Recent Labs    10/23/19 0405  DDIMER 1.36*   Hgb A1c No results for input(s): HGBA1C in the last 72 hours. Lipid Profile No results for input(s): CHOL, HDL, LDLCALC, TRIG, CHOLHDL, LDLDIRECT in the last 72 hours. Thyroid function studies No results for input(s):  TSH, T4TOTAL, T3FREE, THYROIDAB in the last 72 hours.  Invalid input(s):  FREET3 Anemia work up Recent Labs    10/23/19 0405  FERRITIN 121   Urinalysis    Component Value Date/Time   COLORURINE YELLOW 10/10/2018 1144   APPEARANCEUR CLEAR 10/10/2018 1144   LABSPEC 1.020 10/10/2018 1144   PHURINE 6.0 10/10/2018 1144   GLUCOSEU NEGATIVE 10/10/2018 1144   HGBUR NEGATIVE 10/10/2018 1144   BILIRUBINUR SMALL (A) 10/10/2018 1144   BILIRUBINUR neg 06/11/2016 1701   KETONESUR NEGATIVE 10/10/2018 1144   PROTEINUR NEGATIVE 10/10/2018 1144   UROBILINOGEN 0.2 06/11/2016 1701   UROBILINOGEN 0.2 07/17/2011 0900   NITRITE NEGATIVE 10/10/2018 1144   LEUKOCYTESUR NEGATIVE 10/10/2018 1144   Sepsis Labs Invalid input(s): PROCALCITONIN,  WBC,  LACTICIDVEN Microbiology Recent Results (from the past 240 hour(s))  Blood Culture (routine x 2)     Status: None   Collection Time: 10/18/19  8:20 AM   Specimen: BLOOD  Result Value Ref Range Status   Specimen Description   Final    BLOOD LEFT ANTECUBITAL Performed at [facility], [place] [address] [place], [place] [address]    Special Requests   Final    BOTTLES DRAWN AEROBIC AND ANAEROBIC Blood Culture adequate volume Performed at [facility], [address] [place], [place] [address]    Culture   Final    NO GROWTH 5 DAYS Performed at [facility], [place] [address] [place], [place] [address]    Report Status 10/23/2019 FINAL  Final  Blood Culture (routine x 2)     Status: None   Collection Time: 10/18/19  8:33 AM   Specimen: BLOOD  Result Value Ref Range Status   Specimen Description   Final    BLOOD RIGHT ANTECUBITAL Performed at [facility], [place] [address] [place], [place] [address]    Special Requests   Final    BOTTLES DRAWN AEROBIC AND ANAEROBIC Blood Culture adequate volume Performed at [facility], [address] [place], [place] [address]    Culture   Final    NO GROWTH 5 DAYS Performed at [facility], [place] [address] [place],  [address]     Report Status 10/23/2019 FINAL  Final     Time coordinating discharge: 35  minutes  SIGNED: Antonieta Pert, MD  [facility] 10/25/2019, 3:14 PM  If 7PM-7AM, please contact night-coverage [email or web address]

## 2019-10-25 NOTE — TOC Transition Note (Addendum)
Transition of Care [facility]) - CM/SW Discharge Note   Patient Details  Name: Anthony King MRN: [id number] Date of Birth: 04/26/1951  Transition of Care [facility]) CM/SW Contact:  Bethann Berkshire, [place] Phone Number: 10/25/2019, 1:40 PM   Clinical Narrative:     Spoke with pt and he has no preference for [facility] and DME agencies. Pt. Will discharge home with [place] and 02. [place] PT/OT set up with [facility]; [place] notified of discharge. CSW contacted [employer] to deliver 02 to pt. Room. No other needs identified. TOC sign off.   Final next level of care: [place] Barriers to Discharge: No Barriers Identified   Patient Goals and CMS Choice Patient states their goals for this hospitalization and ongoing recovery are:: Pt admitted with COVID. To get better CMS Medicare.gov Compare Post Acute Care list provided to:: Patient Choice offered to / list presented to : Patient  Discharge Placement                       Discharge Plan and Services                DME Arranged: Oxygen DME Agency: [employer]       HH Arranged: PT, OT HH Agency: [place] Date [facility] Agency Contacted: 10/25/19 Time HH Agency Contacted: [phone] Representative spoke with at [place]: [place] Interventions     Readmission Risk Interventions No flowsheet data found.

## 2019-10-25 NOTE — TOC Initial Note (Signed)
Transition of Care [facility]) - Initial/Assessment Note    Patient Details  Name: Anthony King MRN: [id number] Date of Birth: 10-20-1950  Transition of Care [facility]) CM/SW Contact:    Bethann Berkshire, [place] Phone Number: 10/25/2019, 2:01 PM  Clinical Narrative:                   Expected Discharge Plan: Home/Self Care Barriers to Discharge: No Barriers Identified   Patient Goals and CMS Choice Patient states their goals for this hospitalization and ongoing recovery are:: Pt admitted with COVID. To get better CMS Medicare.gov Compare Post Acute Care list provided to:: Patient Choice offered to / list presented to : Patient  Expected Discharge Plan and Services Expected Discharge Plan: Home/Self Care       Living arrangements for the past 2 months: Single Family Home Expected Discharge Date: 10/25/19               DME Arranged: Oxygen DME Agency: [employer]       HH Arranged: PT, OT HH Agency: [place] Date [facility] Agency Contacted: 10/25/19 Time HH Agency Contacted: [phone] Representative spoke with at [place]: [name]  Prior Living Arrangements/Services Living arrangements for the past 2 months: [place] Lives with:: Spouse Patient language and need for interpreter reviewed:: No Do you feel safe going back to the place where you live?: Yes               Activities of Daily Living Home Assistive Devices/Equipment: None ADL Screening (condition at time of admission) Patient's cognitive ability adequate to safely complete daily activities?: No Is the patient deaf or have difficulty hearing?: No Does the patient have difficulty seeing, even when wearing glasses/contacts?: No Does the patient have difficulty concentrating, remembering, or making decisions?: No Patient able to express need for assistance with ADLs?: Yes Does the patient have difficulty dressing or bathing?: No Independently performs ADLs?: Yes (appropriate for developmental  age) Communication: Independent Dressing (OT): Independent Grooming: Independent Feeding: Independent Bathing: Independent Toileting: Independent In/Out Bed: Independent Walks in Home: Independent Does the patient have difficulty walking or climbing stairs?: No Weakness of Legs: Both Weakness of Arms/Hands: None  Permission Sought/Granted                  Emotional Assessment Appearance:: Appears stated age     Orientation: : Oriented to Self, Oriented to Place, Oriented to  Time, Oriented to Situation      Admission diagnosis:  Hypoxia [R09.02] Pneumonia due to COVID-19 virus [U07.1, J12.82] Patient Active Problem List   Diagnosis Date Noted  . Pneumonia due to COVID-19 virus 10/18/2019  . Hypoxia 10/18/2019  . Left lower quadrant abdominal pain 05/31/2018  . CRF (chronic renal failure), stage 3 (moderate) 04/18/2018  . CAD (coronary artery disease), native coronary artery 03/19/2018  . Aortic atherosclerosis ([place]) 03/19/2018  . Prediabetes 03/19/2018  . Unstable angina ([place]) 03/19/2018  . Chest pain 03/18/2018  . CKD (chronic kidney disease), stage III 03/18/2018  . Hyperlipidemia 10/04/2017  . Left flank mass, 3 cm subfascial 08/21/2013  . Mass of chest wall, right, 1 cm, subcutaneous. 08/21/2013  . Esophageal reflux 10/28/2011  . Personal history of colonic polyps 10/28/2011   PCP:  Ann Held, DO Pharmacy:   [facility]), [place] - [id number]  ([place]) [place] [address] Phone: [phone] Fax: [phone]  [place], [place]  [place] [address] Phone: [phone] Fax: [place], [place] [address] Phone: [phone] Fax: [phone]     Social Determinants of Health (SDOH) Interventions     Readmission Risk Interventions No flowsheet data found.

## 2019-10-25 NOTE — Progress Notes (Signed)
Discharge instructions and medication list reviewed. No questions or concerns at this time. Educated patient on home oxygen use. VSS. IV removed as ordered. Patient's wife is headed to facility for discharge.

## 2019-10-25 NOTE — Progress Notes (Signed)
  Patient Saturations on Room Air at Rest = 100%  Patient Saturations on [employer] while Ambulating = 86%  Patient Saturations on 2 Liters of oxygen while Ambulating = 94%  The patient is requiring 2L O2 via  with activity. Mild shortness of breath noted with activity. Patient recovers quickly.

## 2019-10-26 ENCOUNTER — Telehealth: Payer: Self-pay | Admitting: *Deleted

## 2019-10-26 NOTE — Telephone Encounter (Signed)
1st attempt. Unable to reach patient. Unable to leave msg- states mailbox is full.

## 2019-10-27 NOTE — Telephone Encounter (Signed)
I have made two attempts and have been unable to reach patient. I have sent the patient a letter requesting they call the office to schedule a hospital follow up appointment.

## 2019-10-30 ENCOUNTER — Ambulatory Visit: Payer: Medicare Other

## 2019-10-30 DIAGNOSIS — D696 Thrombocytopenia, unspecified: Secondary | ICD-10-CM | POA: Diagnosis not present

## 2019-10-30 DIAGNOSIS — M19041 Primary osteoarthritis, right hand: Secondary | ICD-10-CM | POA: Diagnosis not present

## 2019-10-30 DIAGNOSIS — M160 Bilateral primary osteoarthritis of hip: Secondary | ICD-10-CM | POA: Diagnosis not present

## 2019-10-30 DIAGNOSIS — E785 Hyperlipidemia, unspecified: Secondary | ICD-10-CM | POA: Diagnosis not present

## 2019-10-30 DIAGNOSIS — N1831 Chronic kidney disease, stage 3a: Secondary | ICD-10-CM | POA: Diagnosis not present

## 2019-10-30 DIAGNOSIS — U071 COVID-19: Secondary | ICD-10-CM | POA: Diagnosis not present

## 2019-10-30 DIAGNOSIS — J1282 Pneumonia due to coronavirus disease 2019: Secondary | ICD-10-CM | POA: Diagnosis not present

## 2019-10-30 DIAGNOSIS — Z87891 Personal history of nicotine dependence: Secondary | ICD-10-CM | POA: Diagnosis not present

## 2019-10-30 DIAGNOSIS — K5790 Diverticulosis of intestine, part unspecified, without perforation or abscess without bleeding: Secondary | ICD-10-CM | POA: Diagnosis not present

## 2019-10-30 DIAGNOSIS — M19042 Primary osteoarthritis, left hand: Secondary | ICD-10-CM | POA: Diagnosis not present

## 2019-10-30 DIAGNOSIS — M170 Bilateral primary osteoarthritis of knee: Secondary | ICD-10-CM | POA: Diagnosis not present

## 2019-10-30 DIAGNOSIS — Z9981 Dependence on supplemental oxygen: Secondary | ICD-10-CM | POA: Diagnosis not present

## 2019-10-30 DIAGNOSIS — K219 Gastro-esophageal reflux disease without esophagitis: Secondary | ICD-10-CM | POA: Diagnosis not present

## 2019-10-30 DIAGNOSIS — Z9181 History of falling: Secondary | ICD-10-CM | POA: Diagnosis not present

## 2019-10-30 DIAGNOSIS — J9601 Acute respiratory failure with hypoxia: Secondary | ICD-10-CM | POA: Diagnosis not present

## 2019-10-30 DIAGNOSIS — Z85828 Personal history of other malignant neoplasm of skin: Secondary | ICD-10-CM | POA: Diagnosis not present

## 2019-11-01 DIAGNOSIS — D696 Thrombocytopenia, unspecified: Secondary | ICD-10-CM | POA: Diagnosis not present

## 2019-11-01 DIAGNOSIS — N1831 Chronic kidney disease, stage 3a: Secondary | ICD-10-CM | POA: Diagnosis not present

## 2019-11-01 DIAGNOSIS — J9601 Acute respiratory failure with hypoxia: Secondary | ICD-10-CM | POA: Diagnosis not present

## 2019-11-01 DIAGNOSIS — E785 Hyperlipidemia, unspecified: Secondary | ICD-10-CM | POA: Diagnosis not present

## 2019-11-01 DIAGNOSIS — U071 COVID-19: Secondary | ICD-10-CM | POA: Diagnosis not present

## 2019-11-01 DIAGNOSIS — J1282 Pneumonia due to coronavirus disease 2019: Secondary | ICD-10-CM | POA: Diagnosis not present

## 2019-11-07 ENCOUNTER — Other Ambulatory Visit: Payer: Self-pay

## 2019-11-07 ENCOUNTER — Ambulatory Visit (HOSPITAL_BASED_OUTPATIENT_CLINIC_OR_DEPARTMENT_OTHER)
Admission: RE | Admit: 2019-11-07 | Discharge: 2019-11-07 | Disposition: A | Discharge status: Home / Self Care | Payer: Medicare Other | Source: Ambulatory Visit | Attending: Family Medicine | Admitting: Family Medicine

## 2019-11-07 ENCOUNTER — Encounter: Payer: Self-pay | Admitting: Family Medicine

## 2019-11-07 ENCOUNTER — Ambulatory Visit (INDEPENDENT_AMBULATORY_CARE_PROVIDER_SITE_OTHER): Payer: Medicare Other | Admitting: Family Medicine

## 2019-11-07 VITALS — BP 120/68 | HR 77 | Temp 97.5°F | Resp 18 | Ht 70.0 in | Wt 197.8 lb

## 2019-11-07 DIAGNOSIS — U071 COVID-19: Secondary | ICD-10-CM

## 2019-11-07 DIAGNOSIS — J9601 Acute respiratory failure with hypoxia: Secondary | ICD-10-CM | POA: Diagnosis not present

## 2019-11-07 DIAGNOSIS — E785 Hyperlipidemia, unspecified: Secondary | ICD-10-CM | POA: Diagnosis not present

## 2019-11-07 DIAGNOSIS — D696 Thrombocytopenia, unspecified: Secondary | ICD-10-CM | POA: Diagnosis not present

## 2019-11-07 DIAGNOSIS — N1831 Chronic kidney disease, stage 3a: Secondary | ICD-10-CM | POA: Diagnosis not present

## 2019-11-07 DIAGNOSIS — J1282 Pneumonia due to coronavirus disease 2019: Secondary | ICD-10-CM | POA: Diagnosis not present

## 2019-11-07 LAB — BASIC METABOLIC PANEL
BUN: 14 mg/dL (ref 6–23)
CO2: 29 mEq/L (ref 19–32)
Calcium: 8.6 mg/dL (ref 8.4–10.5)
Chloride: 106 mEq/L (ref 96–112)
Creatinine, Ser: 1.2 mg/dL (ref 0.40–1.50)
GFR: 60.04 mL/min (ref >60.00)
Glucose, Bld: 100 mg/dL — ABNORMAL HIGH (ref 70–99)
Potassium: 4.7 mEq/L (ref 3.5–5.1)
Sodium: 138 mEq/L (ref 135–145)

## 2019-11-07 LAB — CBC WITH DIFFERENTIAL/PLATELET
Basophils Absolute: 0.1 10*3/uL (ref 0.0–0.1)
Basophils Relative: 1.1 % (ref 0.0–3.0)
Eosinophils Absolute: 0.4 10*3/uL (ref 0.0–0.7)
Eosinophils Relative: 6.9 % — ABNORMAL HIGH (ref 0.0–5.0)
HCT: 43.6 % (ref 39.0–52.0)
Hemoglobin: 14.3 g/dL (ref 13.0–17.0)
Lymphocytes Relative: 29.4 % (ref 12.0–46.0)
Lymphs Abs: 1.6 10*3/uL (ref 0.7–4.0)
MCHC: 32.9 g/dL (ref 30.0–36.0)
MCV: 78.8 fl (ref 78.0–100.0)
Monocytes Absolute: 0.6 10*3/uL (ref 0.1–1.0)
Monocytes Relative: 11.2 % (ref 3.0–12.0)
Neutro Abs: 2.7 10*3/uL (ref 1.4–7.7)
Neutrophils Relative %: 51.4 % (ref 43.0–77.0)
Platelets: 113 10*3/uL — ABNORMAL LOW (ref 150.0–400.0)
RBC: 5.52 Mil/uL (ref 4.22–5.81)
RDW: 17.3 % — ABNORMAL HIGH (ref 11.5–15.5)
WBC: 5.3 10*3/uL (ref 4.0–10.5)

## 2019-11-07 MED ORDER — AZITHROMYCIN 250 MG PO TABS: ORAL_TABLET | ORAL | 1 refills | Status: DC

## 2019-11-07 NOTE — Patient Instructions (Signed)
COVID-19 COVID-19 is a respiratory infection that is caused by a virus called severe acute respiratory syndrome coronavirus 2 (SARS-CoV-2). The disease is also known as coronavirus disease or novel coronavirus. In some people, the virus may not cause any symptoms. In others, it may cause a serious infection. The infection can get worse quickly and can lead to complications, such as:  Pneumonia, or infection of the lungs.  Acute respiratory distress syndrome or ARDS. This is a condition in which fluid build-up in the lungs prevents the lungs from filling with air and passing oxygen into the blood.  Acute respiratory failure. This is a condition in which there is not enough oxygen passing from the lungs to the body or when carbon dioxide is not passing from the lungs out of the body.  Sepsis or septic shock. This is a serious bodily reaction to an infection.  Blood clotting problems.  Secondary infections due to bacteria or fungus.  Organ failure. This is when your body's organs stop working. The virus that causes COVID-19 is contagious. This means that it can spread from person to person through droplets from coughs and sneezes (respiratory secretions). What are the causes? This illness is caused by a virus. You may catch the virus by:  Breathing in droplets from an infected person. Droplets can be spread by a person breathing, speaking, singing, coughing, or sneezing.  Touching something, like a table or a doorknob, that was exposed to the virus (contaminated) and then touching your mouth, nose, or eyes. What increases the risk? Risk for infection You are more likely to be infected with this virus if you:  Are within 6 feet (2 meters) of a person with COVID-19.  Provide care for or live with a person who is infected with COVID-19.  Spend time in crowded indoor spaces or live in shared housing. Risk for serious illness You are more likely to become seriously ill from the virus if you:   Are 50 years of age or older. The higher your age, the more you are at risk for serious illness.  Live in a nursing home or long-term care facility.  Have cancer.  Have a long-term (chronic) disease such as: ? Chronic lung disease, including chronic obstructive pulmonary disease or asthma. ? A long-term disease that lowers your body's ability to fight infection (immunocompromised). ? Heart disease, including heart failure, a condition in which the arteries that lead to the heart become narrow or blocked (coronary artery disease), a disease which makes the heart muscle thick, weak, or stiff (cardiomyopathy). ? Diabetes. ? Chronic kidney disease. ? Sickle cell disease, a condition in which red blood cells have an abnormal "sickle" shape. ? Liver disease.  Are obese. What are the signs or symptoms? Symptoms of this condition can range from mild to severe. Symptoms may appear any time from 2 to 14 days after being exposed to the virus. They include:  A fever or chills.  A cough.  Difficulty breathing.  Headaches, body aches, or muscle aches.  Runny or stuffy (congested) nose.  A sore throat.  New loss of taste or smell. Some people may also have stomach problems, such as nausea, vomiting, or diarrhea. Other people may not have any symptoms of COVID-19. How is this diagnosed? This condition may be diagnosed based on:  Your signs and symptoms, especially if: ? You live in an area with a COVID-19 outbreak. ? You recently traveled to or from an area where the virus is common. ? You   provide care for or live with a person who was diagnosed with COVID-19. ? You were exposed to a person who was diagnosed with COVID-19.  A physical exam.  Lab tests, which may include: ? Taking a sample of fluid from the back of your nose and throat (nasopharyngeal fluid), your nose, or your throat using a swab. ? A sample of mucus from your lungs (sputum). ? Blood tests.  Imaging tests, which  may include, X-rays, CT scan, or ultrasound. How is this treated? At present, there is no medicine to treat COVID-19. Medicines that treat other diseases are being used on a trial basis to see if they are effective against COVID-19. Your health care provider will talk with you about ways to treat your symptoms. For most people, the infection is mild and can be managed at home with rest, fluids, and over-the-counter medicines. Treatment for a serious infection usually takes places in a hospital intensive care unit (ICU). It may include one or more of the following treatments. These treatments are given until your symptoms improve.  Receiving fluids and medicines through an IV.  Supplemental oxygen. Extra oxygen is given through a tube in the nose, a face mask, or a hood.  Positioning you to lie on your stomach (prone position). This makes it easier for oxygen to get into the lungs.  Continuous positive airway pressure (CPAP) or bi-level positive airway pressure (BPAP) machine. This treatment uses mild air pressure to keep the airways open. A tube that is connected to a motor delivers oxygen to the body.  Ventilator. This treatment moves air into and out of the lungs by using a tube that is placed in your windpipe.  Tracheostomy. This is a procedure to create a hole in the neck so that a breathing tube can be inserted.  Extracorporeal membrane oxygenation (ECMO). This procedure gives the lungs a chance to recover by taking over the functions of the heart and lungs. It supplies oxygen to the body and removes carbon dioxide. Follow these instructions at home: Lifestyle  If you are sick, stay home except to get medical care. Your health care provider will tell you how long to stay home. Call your health care provider before you go for medical care.  Rest at home as told by your health care provider.  Do not use any products that contain nicotine or tobacco, such as cigarettes, e-cigarettes, and  chewing tobacco. If you need help quitting, ask your health care provider.  Return to your normal activities as told by your health care provider. Ask your health care provider what activities are safe for you. General instructions  Take over-the-counter and prescription medicines only as told by your health care provider.  Drink enough fluid to keep your urine pale yellow.  Keep all follow-up visits as told by your health care provider. This is important. How is this prevented?  There is no vaccine to help prevent COVID-19 infection. However, there are steps you can take to protect yourself and others from this virus. To protect yourself:   Do not travel to areas where COVID-19 is a risk. The areas where COVID-19 is reported change often. To identify high-risk areas and travel restrictions, check the CDC travel website: wwwnc.cdc.gov/travel/notices  If you live in, or must travel to, an area where COVID-19 is a risk, take precautions to avoid infection. ? Stay away from people who are sick. ? Wash your hands often with soap and water for 20 seconds. If soap and water   are not available, use an alcohol-based hand sanitizer. ? Avoid touching your mouth, face, eyes, or nose. ? Avoid going out in public, follow guidance from your state and local health authorities. ? If you must go out in public, wear a cloth face covering or face mask. Make sure your mask covers your nose and mouth. ? Avoid crowded indoor spaces. Stay at least 6 feet (2 meters) away from others. ? Disinfect objects and surfaces that are frequently touched every day. This may include:  Counters and tables.  Doorknobs and light switches.  Sinks and faucets.  Electronics, such as phones, remote controls, keyboards, computers, and tablets. To protect others: If you have symptoms of COVID-19, take steps to prevent the virus from spreading to others.  If you think you have a COVID-19 infection, contact your health care  provider right away. Tell your health care team that you think you may have a COVID-19 infection.  Stay home. Leave your house only to seek medical care. Do not use public transport.  Do not travel while you are sick.  Wash your hands often with soap and water for 20 seconds. If soap and water are not available, use alcohol-based hand sanitizer.  Stay away from other members of your household. Let healthy household members care for children and pets, if possible. If you have to care for children or pets, wash your hands often and wear a mask. If possible, stay in your own room, separate from others. Use a different bathroom.  Make sure that all people in your household wash their hands well and often.  Cough or sneeze into a tissue or your sleeve or elbow. Do not cough or sneeze into your hand or into the air.  Wear a cloth face covering or face mask. Make sure your mask covers your nose and mouth. Where to find more information  Centers for Disease Control and Prevention: www.cdc.gov/coronavirus/2019-ncov/index.html  World Health Organization: www.who.int/health-topics/coronavirus Contact a health care provider if:  You live in or have traveled to an area where COVID-19 is a risk and you have symptoms of the infection.  You have had contact with someone who has COVID-19 and you have symptoms of the infection. Get help right away if:  You have trouble breathing.  You have pain or pressure in your chest.  You have confusion.  You have bluish lips and fingernails.  You have difficulty waking from sleep.  You have symptoms that get worse. These symptoms may represent a serious problem that is an emergency. Do not wait to see if the symptoms will go away. Get medical help right away. Call your local emergency services (911 in the [place] Do not drive yourself to the hospital. Let the emergency medical personnel know if you think you have COVID-19. Summary  COVID-19 is a  respiratory infection that is caused by a virus. It is also known as coronavirus disease or novel coronavirus. It can cause serious infections, such as pneumonia, acute respiratory distress syndrome, acute respiratory failure, or sepsis.  The virus that causes COVID-19 is contagious. This means that it can spread from person to person through droplets from breathing, speaking, singing, coughing, or sneezing.  You are more likely to develop a serious illness if you are 50 years of age or older, have a weak immune system, live in a nursing home, or have chronic disease.  There is no medicine to treat COVID-19. Your health care provider will talk with you about ways to treat your symptoms.    Take steps to protect yourself and others from infection. Wash your hands often and disinfect objects and surfaces that are frequently touched every day. Stay away from people who are sick and wear a mask if you are sick. This information is not intended to replace advice given to you by your health care provider. Make sure you discuss any questions you have with your health care provider. Document Revised: 04/14/2019 Document Reviewed: 07/21/2018 Elsevier Patient Education  2020 [employer]  

## 2019-11-08 NOTE — Progress Notes (Signed)
Patient ID: Anthony King, male    DOB: 1951-01-04  Age: 69 y.o. MRN: [id number]    Subjective:  Subjective  HPI Anthony King presents for hosp f/u for covid pneumonia.  He was admitted 4/21-4/28  He tested positive for covid on 4/19=== his sob progressively worsened.  We did a virtual visit 4/20 and he was sob on phone--- pulse ox 90% --  I recommended he go to Er.  He was reluctant at first but ultimately went to er and was dx with pneumonia and admitted Pt states he is feeling better but is still sob  He needed a wheelchair to come to the office today.  He has been very weak.  His sister came with him today but is in the car.    Review of Systems  Constitutional: Negative for appetite change, diaphoresis, fatigue and unexpected weight change.  Eyes: Negative for pain, redness and visual disturbance.  Respiratory: Positive for shortness of breath. Negative for cough, chest tightness and wheezing.   Cardiovascular: Negative for chest pain, palpitations and leg swelling.  Endocrine: Negative for cold intolerance, heat intolerance, polydipsia, polyphagia and polyuria.  Genitourinary: Negative for difficulty urinating, dysuria and frequency.  Neurological: Positive for weakness. Negative for dizziness, light-headedness, numbness and headaches.    History Past Medical History:  Diagnosis Date  . Arthritis 07-17-11   hands, hips, knees  . Basal cell carcinoma    nose  . Chronic kidney disease 07-17-11   past kidney stone x1  . Colon polyps    ADENOMATOUS AND HYPERPLASTIC POLYPS  . Diverticulosis   . Gastric ulcer   . GERD (gastroesophageal reflux disease)   . Hemorrhoids   . Hyperlipidemia   . Wears glasses     He has a past surgical history that includes Appendectomy; fatty tumors (07-17-11); Inguinal hernia repair (07/23/2011); Colonoscopy; Excision basal cell carcinoma; Mass excision (N/A, 08/29/2013); LEFT HEART CATH AND CORONARY ANGIOGRAPHY (N/A, 03/21/2018); Hemorrhoid  banding; Inguinal hernia repair (Bilateral, 04/19/2019); and Polypectomy.   His family history includes Arthritis in his maternal grandmother, mother, and paternal grandmother; Breast cancer in his mother; Colon cancer in his brother and father; Stomach cancer in his father and sister.He reports that he quit smoking about 28 years ago. His smoking use included cigarettes. He has never used smokeless tobacco. He reports that he does not drink alcohol or use drugs.  Current Outpatient Medications on File Prior to Visit  Medication Sig Dispense Refill  . lansoprazole (PREVACID) 30 MG capsule Take 30 mg by mouth daily at 12 noon.    . Psyllium (METAMUCIL FREE & NATURAL) 43 % POWD Take 2 Units by mouth daily. Metamucil 2 teaspoons    . vitamin B-12 (CYANOCOBALAMIN) 1000 MCG tablet Take 1,000 mcg by mouth daily.     Marland Kitchen loratadine (CLARITIN) 10 MG tablet Take 10 mg by mouth daily.     No current facility-administered medications on file prior to visit.     Objective:  Objective  Physical Exam Vitals and nursing note reviewed.  Constitutional:      General: He is sleeping.     Appearance: He is well-developed.  HENT:     Head: Normocephalic and atraumatic.  Eyes:     Pupils: Pupils are equal, round, and reactive to light.  Neck:     Thyroid: No thyromegaly.  Cardiovascular:     Rate and Rhythm: Normal rate and regular rhythm.     Heart sounds: No murmur.  Pulmonary:  Effort: Pulmonary effort is normal. No respiratory distress.     Breath sounds: Decreased air movement present. No wheezing or rales.  Chest:     Chest wall: No tenderness.  Musculoskeletal:        General: No tenderness.     Cervical back: Normal range of motion and neck supple.  Skin:    General: Skin is warm and dry.  Neurological:     Mental Status: He is oriented to person, place, and time.  Psychiatric:        Behavior: Behavior normal.        Thought Content: Thought content normal.        Judgment:  Judgment normal.    BP 120/68 (BP Location: Left Arm, Patient Position: Sitting, Cuff Size: Normal)   Pulse 77   Temp (!) 97.5 F (36.4 C) (Temporal)   Resp 18   Ht 5\' 10"  (1.778 m)   Wt 197 lb 12.8 oz (89.7 kg)   SpO2 96%   BMI 28.38 kg/m  Wt Readings from Last 3 Encounters:  11/07/19 197 lb 12.8 oz (89.7 kg)  10/18/19 200 lb (90.7 kg)  09/15/19 217 lb (98.4 kg)     Lab Results  Component Value Date   WBC 5.3 11/07/2019   HGB 14.3 11/07/2019   HCT 43.6 11/07/2019   PLT 113.0 (L) 11/07/2019   GLUCOSE 100 (H) 11/07/2019   CHOL 207 (H) 03/19/2018   TRIG 111 10/18/2019   HDL 33 (L) 03/19/2018   LDLDIRECT 141.0 06/11/2016   LDLCALC 150 (H) 03/19/2018   ALT 50 (H) 10/23/2019   AST 30 10/23/2019   NA 138 11/07/2019   K 4.7 11/07/2019   CL 106 11/07/2019   CREATININE 1.20 11/07/2019   BUN 14 11/07/2019   CO2 29 11/07/2019   PSA 0.68 06/11/2016   INR 1.01 03/18/2018   HGBA1C 5.7 (H) 03/18/2018    DG Chest 2 View  Result Date: 11/07/2019 CLINICAL DATA:  Follow-up COVID-19 pneumonia for more than 21 days, cramping in hands and feet EXAM: CHEST - 2 VIEW COMPARISON:  10/18/2019 FINDINGS: Normal heart size, mediastinal contours, and pulmonary vascularity. Patchy BILATERAL pulmonary infiltrates consistent with multifocal pneumonia, increased since prior study. No pleural effusion or pneumothorax. Osseous structures unremarkable. IMPRESSION: Increased BILATERAL pulmonary infiltrates consistent with multifocal pneumonia and history of COVID-19. Electronically Signed   By: Lavonia Dana M.D.   On: 11/07/2019 12:48     Assessment & Plan:  Plan  I have discontinued Otila Back. Anthony "Robert"'s loperamide and promethazine-dextromethorphan. I am also having him start on azithromycin. Additionally, I am having him maintain his vitamin B-12, lansoprazole, Metamucil Free & Natural, and loratadine.  Meds ordered this encounter  Medications  . azithromycin (ZITHROMAX Z-PAK) 250 MG  tablet    Sig: As directed    Dispense:  6 each    Refill:  1    Problem List Items Addressed This Visit      Unprioritized   Pneumonia due to COVID-19 virus - Primary   Relevant Medications   azithromycin (ZITHROMAX Z-PAK) 250 MG tablet   Other Relevant Orders   CBC with Differential/Platelet (Completed)   Basic metabolic panel (Completed)   DG Chest 2 View (Completed)    xray showed inc in infiltrates---  zithromax sent in and will have pt f/u in 2 -3 weeks insead of 3 months  Or sooner prn   Follow-up: Return in about 3 months (around 02/07/2020), or if symptoms worsen or fail  to improve.  Ann Held, DO

## 2019-11-13 ENCOUNTER — Ambulatory Visit: Payer: Medicare Other | Admitting: Cardiovascular Disease

## 2019-11-13 DIAGNOSIS — J9601 Acute respiratory failure with hypoxia: Secondary | ICD-10-CM | POA: Diagnosis not present

## 2019-11-13 DIAGNOSIS — D696 Thrombocytopenia, unspecified: Secondary | ICD-10-CM | POA: Diagnosis not present

## 2019-11-13 DIAGNOSIS — U071 COVID-19: Secondary | ICD-10-CM | POA: Diagnosis not present

## 2019-11-13 DIAGNOSIS — N1831 Chronic kidney disease, stage 3a: Secondary | ICD-10-CM | POA: Diagnosis not present

## 2019-11-13 DIAGNOSIS — E785 Hyperlipidemia, unspecified: Secondary | ICD-10-CM | POA: Diagnosis not present

## 2019-11-13 DIAGNOSIS — J1282 Pneumonia due to coronavirus disease 2019: Secondary | ICD-10-CM | POA: Diagnosis not present

## 2019-11-15 DIAGNOSIS — N1831 Chronic kidney disease, stage 3a: Secondary | ICD-10-CM | POA: Diagnosis not present

## 2019-11-15 DIAGNOSIS — D696 Thrombocytopenia, unspecified: Secondary | ICD-10-CM | POA: Diagnosis not present

## 2019-11-15 DIAGNOSIS — J1282 Pneumonia due to coronavirus disease 2019: Secondary | ICD-10-CM | POA: Diagnosis not present

## 2019-11-15 DIAGNOSIS — J9601 Acute respiratory failure with hypoxia: Secondary | ICD-10-CM | POA: Diagnosis not present

## 2019-11-15 DIAGNOSIS — E785 Hyperlipidemia, unspecified: Secondary | ICD-10-CM | POA: Diagnosis not present

## 2019-11-15 DIAGNOSIS — U071 COVID-19: Secondary | ICD-10-CM | POA: Diagnosis not present

## 2019-11-22 DIAGNOSIS — U071 COVID-19: Secondary | ICD-10-CM | POA: Diagnosis not present

## 2019-11-22 DIAGNOSIS — E785 Hyperlipidemia, unspecified: Secondary | ICD-10-CM | POA: Diagnosis not present

## 2019-11-22 DIAGNOSIS — J1282 Pneumonia due to coronavirus disease 2019: Secondary | ICD-10-CM | POA: Diagnosis not present

## 2019-11-22 DIAGNOSIS — D696 Thrombocytopenia, unspecified: Secondary | ICD-10-CM | POA: Diagnosis not present

## 2019-11-22 DIAGNOSIS — N1831 Chronic kidney disease, stage 3a: Secondary | ICD-10-CM | POA: Diagnosis not present

## 2019-11-22 DIAGNOSIS — J9601 Acute respiratory failure with hypoxia: Secondary | ICD-10-CM | POA: Diagnosis not present

## 2019-11-23 ENCOUNTER — Ambulatory Visit (INDEPENDENT_AMBULATORY_CARE_PROVIDER_SITE_OTHER): Payer: Medicare Other | Admitting: Family Medicine

## 2019-11-23 ENCOUNTER — Other Ambulatory Visit: Payer: Self-pay

## 2019-11-23 ENCOUNTER — Encounter: Payer: Self-pay | Admitting: Family Medicine

## 2019-11-23 ENCOUNTER — Ambulatory Visit (HOSPITAL_BASED_OUTPATIENT_CLINIC_OR_DEPARTMENT_OTHER)
Admission: RE | Admit: 2019-11-23 | Discharge: 2019-11-23 | Disposition: A | Discharge status: Home / Self Care | Payer: Medicare Other | Source: Ambulatory Visit | Attending: Family Medicine | Admitting: Family Medicine

## 2019-11-23 VITALS — BP 110/80 | HR 80 | Temp 97.3°F | Resp 18 | Ht 70.0 in | Wt 198.2 lb

## 2019-11-23 DIAGNOSIS — U071 COVID-19: Secondary | ICD-10-CM | POA: Diagnosis not present

## 2019-11-23 DIAGNOSIS — J1282 Pneumonia due to coronavirus disease 2019: Secondary | ICD-10-CM | POA: Diagnosis not present

## 2019-11-23 DIAGNOSIS — J189 Pneumonia, unspecified organism: Secondary | ICD-10-CM | POA: Diagnosis not present

## 2019-11-23 MED ORDER — ALBUTEROL SULFATE HFA 108 (90 BASE) MCG/ACT IN AERS: 2.0000 | INHALATION_SPRAY | Freq: Four times a day (QID) | RESPIRATORY_TRACT | 1 refills | Status: DC | PRN

## 2019-11-23 NOTE — Patient Instructions (Signed)
COVID-19 COVID-19 is a respiratory infection that is caused by a virus called severe acute respiratory syndrome coronavirus 2 (SARS-CoV-2). The disease is also known as coronavirus disease or novel coronavirus. In some people, the virus may not cause any symptoms. In others, it may cause a serious infection. The infection can get worse quickly and can lead to complications, such as:  Pneumonia, or infection of the lungs.  Acute respiratory distress syndrome or ARDS. This is a condition in which fluid build-up in the lungs prevents the lungs from filling with air and passing oxygen into the blood.  Acute respiratory failure. This is a condition in which there is not enough oxygen passing from the lungs to the body or when carbon dioxide is not passing from the lungs out of the body.  Sepsis or septic shock. This is a serious bodily reaction to an infection.  Blood clotting problems.  Secondary infections due to bacteria or fungus.  Organ failure. This is when your body's organs stop working. The virus that causes COVID-19 is contagious. This means that it can spread from person to person through droplets from coughs and sneezes (respiratory secretions). What are the causes? This illness is caused by a virus. You may catch the virus by:  Breathing in droplets from an infected person. Droplets can be spread by a person breathing, speaking, singing, coughing, or sneezing.  Touching something, like a table or a doorknob, that was exposed to the virus (contaminated) and then touching your mouth, nose, or eyes. What increases the risk? Risk for infection You are more likely to be infected with this virus if you:  Are within 6 feet (2 meters) of a person with COVID-19.  Provide care for or live with a person who is infected with COVID-19.  Spend time in crowded indoor spaces or live in shared housing. Risk for serious illness You are more likely to become seriously ill from the virus if you:   Are 50 years of age or older. The higher your age, the more you are at risk for serious illness.  Live in a nursing home or long-term care facility.  Have cancer.  Have a long-term (chronic) disease such as: ? Chronic lung disease, including chronic obstructive pulmonary disease or asthma. ? A long-term disease that lowers your body's ability to fight infection (immunocompromised). ? Heart disease, including heart failure, a condition in which the arteries that lead to the heart become narrow or blocked (coronary artery disease), a disease which makes the heart muscle thick, weak, or stiff (cardiomyopathy). ? Diabetes. ? Chronic kidney disease. ? Sickle cell disease, a condition in which red blood cells have an abnormal "sickle" shape. ? Liver disease.  Are obese. What are the signs or symptoms? Symptoms of this condition can range from mild to severe. Symptoms may appear any time from 2 to 14 days after being exposed to the virus. They include:  A fever or chills.  A cough.  Difficulty breathing.  Headaches, body aches, or muscle aches.  Runny or stuffy (congested) nose.  A sore throat.  New loss of taste or smell. Some people may also have stomach problems, such as nausea, vomiting, or diarrhea. Other people may not have any symptoms of COVID-19. How is this diagnosed? This condition may be diagnosed based on:  Your signs and symptoms, especially if: ? You live in an area with a COVID-19 outbreak. ? You recently traveled to or from an area where the virus is common. ? You   provide care for or live with a person who was diagnosed with COVID-19. ? You were exposed to a person who was diagnosed with COVID-19.  A physical exam.  Lab tests, which may include: ? Taking a sample of fluid from the back of your nose and throat (nasopharyngeal fluid), your nose, or your throat using a swab. ? A sample of mucus from your lungs (sputum). ? Blood tests.  Imaging tests, which  may include, X-rays, CT scan, or ultrasound. How is this treated? At present, there is no medicine to treat COVID-19. Medicines that treat other diseases are being used on a trial basis to see if they are effective against COVID-19. Your health care provider will talk with you about ways to treat your symptoms. For most people, the infection is mild and can be managed at home with rest, fluids, and over-the-counter medicines. Treatment for a serious infection usually takes places in a hospital intensive care unit (ICU). It may include one or more of the following treatments. These treatments are given until your symptoms improve.  Receiving fluids and medicines through an IV.  Supplemental oxygen. Extra oxygen is given through a tube in the nose, a face mask, or a hood.  Positioning you to lie on your stomach (prone position). This makes it easier for oxygen to get into the lungs.  Continuous positive airway pressure (CPAP) or bi-level positive airway pressure (BPAP) machine. This treatment uses mild air pressure to keep the airways open. A tube that is connected to a motor delivers oxygen to the body.  Ventilator. This treatment moves air into and out of the lungs by using a tube that is placed in your windpipe.  Tracheostomy. This is a procedure to create a hole in the neck so that a breathing tube can be inserted.  Extracorporeal membrane oxygenation (ECMO). This procedure gives the lungs a chance to recover by taking over the functions of the heart and lungs. It supplies oxygen to the body and removes carbon dioxide. Follow these instructions at home: Lifestyle  If you are sick, stay home except to get medical care. Your health care provider will tell you how long to stay home. Call your health care provider before you go for medical care.  Rest at home as told by your health care provider.  Do not use any products that contain nicotine or tobacco, such as cigarettes, e-cigarettes, and  chewing tobacco. If you need help quitting, ask your health care provider.  Return to your normal activities as told by your health care provider. Ask your health care provider what activities are safe for you. General instructions  Take over-the-counter and prescription medicines only as told by your health care provider.  Drink enough fluid to keep your urine pale yellow.  Keep all follow-up visits as told by your health care provider. This is important. How is this prevented?  There is no vaccine to help prevent COVID-19 infection. However, there are steps you can take to protect yourself and others from this virus. To protect yourself:   Do not travel to areas where COVID-19 is a risk. The areas where COVID-19 is reported change often. To identify high-risk areas and travel restrictions, check the CDC travel website: wwwnc.cdc.gov/travel/notices  If you live in, or must travel to, an area where COVID-19 is a risk, take precautions to avoid infection. ? Stay away from people who are sick. ? Wash your hands often with soap and water for 20 seconds. If soap and water   are not available, use an alcohol-based hand sanitizer. ? Avoid touching your mouth, face, eyes, or nose. ? Avoid going out in public, follow guidance from your state and local health authorities. ? If you must go out in public, wear a cloth face covering or face mask. Make sure your mask covers your nose and mouth. ? Avoid crowded indoor spaces. Stay at least 6 feet (2 meters) away from others. ? Disinfect objects and surfaces that are frequently touched every day. This may include:  Counters and tables.  Doorknobs and light switches.  Sinks and faucets.  Electronics, such as phones, remote controls, keyboards, computers, and tablets. To protect others: If you have symptoms of COVID-19, take steps to prevent the virus from spreading to others.  If you think you have a COVID-19 infection, contact your health care  provider right away. Tell your health care team that you think you may have a COVID-19 infection.  Stay home. Leave your house only to seek medical care. Do not use public transport.  Do not travel while you are sick.  Wash your hands often with soap and water for 20 seconds. If soap and water are not available, use alcohol-based hand sanitizer.  Stay away from other members of your household. Let healthy household members care for children and pets, if possible. If you have to care for children or pets, wash your hands often and wear a mask. If possible, stay in your own room, separate from others. Use a different bathroom.  Make sure that all people in your household wash their hands well and often.  Cough or sneeze into a tissue or your sleeve or elbow. Do not cough or sneeze into your hand or into the air.  Wear a cloth face covering or face mask. Make sure your mask covers your nose and mouth. Where to find more information  Centers for Disease Control and Prevention: www.cdc.gov/coronavirus/2019-ncov/index.html  World Health Organization: www.who.int/health-topics/coronavirus Contact a health care provider if:  You live in or have traveled to an area where COVID-19 is a risk and you have symptoms of the infection.  You have had contact with someone who has COVID-19 and you have symptoms of the infection. Get help right away if:  You have trouble breathing.  You have pain or pressure in your chest.  You have confusion.  You have bluish lips and fingernails.  You have difficulty waking from sleep.  You have symptoms that get worse. These symptoms may represent a serious problem that is an emergency. Do not wait to see if the symptoms will go away. Get medical help right away. Call your local emergency services (911 in the [place] Do not drive yourself to the hospital. Let the emergency medical personnel know if you think you have COVID-19. Summary  COVID-19 is a  respiratory infection that is caused by a virus. It is also known as coronavirus disease or novel coronavirus. It can cause serious infections, such as pneumonia, acute respiratory distress syndrome, acute respiratory failure, or sepsis.  The virus that causes COVID-19 is contagious. This means that it can spread from person to person through droplets from breathing, speaking, singing, coughing, or sneezing.  You are more likely to develop a serious illness if you are 50 years of age or older, have a weak immune system, live in a nursing home, or have chronic disease.  There is no medicine to treat COVID-19. Your health care provider will talk with you about ways to treat your symptoms.    Take steps to protect yourself and others from infection. Wash your hands often and disinfect objects and surfaces that are frequently touched every day. Stay away from people who are sick and wear a mask if you are sick. This information is not intended to replace advice given to you by your health care provider. Make sure you discuss any questions you have with your health care provider. Document Revised: 04/14/2019 Document Reviewed: 07/21/2018 Elsevier Patient Education  2020 [employer]  

## 2019-11-23 NOTE — Progress Notes (Signed)
Patient ID: Anthony King, male    DOB: 1951-01-12  Age: 69 y.o. MRN: [id number]    Subjective:  Subjective  HPI BYFORD FURSE presents for f/u pneumonia due to covid.  He is feeling better but is sob easily and has trouble catching his breath.   Review of Systems  Constitutional: Negative for appetite change, diaphoresis, fatigue and unexpected weight change.  Eyes: Negative for pain, redness and visual disturbance.  Respiratory: Positive for shortness of breath. Negative for cough, chest tightness and wheezing.   Cardiovascular: Negative for chest pain, palpitations and leg swelling.  Endocrine: Negative for cold intolerance, heat intolerance, polydipsia, polyphagia and polyuria.  Genitourinary: Negative for difficulty urinating, dysuria and frequency.  Neurological: Negative for dizziness, light-headedness, numbness and headaches.    History Past Medical History:  Diagnosis Date  . Arthritis 07-17-11   hands, hips, knees  . Basal cell carcinoma    nose  . Chronic kidney disease 07-17-11   past kidney stone x1  . Colon polyps    ADENOMATOUS AND HYPERPLASTIC POLYPS  . Diverticulosis   . Gastric ulcer   . GERD (gastroesophageal reflux disease)   . Hemorrhoids   . Hyperlipidemia   . Wears glasses     He has a past surgical history that includes Appendectomy; fatty tumors (07-17-11); Inguinal hernia repair (07/23/2011); Colonoscopy; Excision basal cell carcinoma; Mass excision (N/A, 08/29/2013); LEFT HEART CATH AND CORONARY ANGIOGRAPHY (N/A, 03/21/2018); Hemorrhoid banding; Inguinal hernia repair (Bilateral, 04/19/2019); and Polypectomy.   His family history includes Arthritis in his maternal grandmother, mother, and paternal grandmother; Breast cancer in his mother; Colon cancer in his brother and father; Stomach cancer in his father and sister.He reports that he quit smoking about 28 years ago. His smoking use included cigarettes. He has never used smokeless tobacco. He  reports that he does not drink alcohol or use drugs.  Current Outpatient Medications on File Prior to Visit  Medication Sig Dispense Refill  . lansoprazole (PREVACID) 30 MG capsule Take 30 mg by mouth daily at 12 noon.    . loratadine (CLARITIN) 10 MG tablet Take 10 mg by mouth daily.    . Psyllium (METAMUCIL FREE & NATURAL) 43 % POWD Take 2 Units by mouth daily. Metamucil 2 teaspoons    . vitamin B-12 (CYANOCOBALAMIN) 1000 MCG tablet Take 1,000 mcg by mouth daily.      No current facility-administered medications on file prior to visit.     Objective:  Objective  Physical Exam Vitals and nursing note reviewed.  Constitutional:      General: He is sleeping.     Appearance: He is well-developed.  HENT:     Head: Normocephalic and atraumatic.  Eyes:     Pupils: Pupils are equal, round, and reactive to light.  Neck:     Thyroid: No thyromegaly.  Cardiovascular:     Rate and Rhythm: Normal rate and regular rhythm.     Heart sounds: No murmur.  Pulmonary:     Effort: Pulmonary effort is normal. No respiratory distress.     Breath sounds: Normal breath sounds. Decreased air movement present. No wheezing or rales.  Chest:     Chest wall: No tenderness.  Musculoskeletal:        General: No tenderness.     Cervical back: Normal range of motion and neck supple.  Skin:    General: Skin is warm and dry.  Neurological:     Mental Status: He is oriented to person, place, and  time.  Psychiatric:        Behavior: Behavior normal.        Thought Content: Thought content normal.        Judgment: Judgment normal.    BP 110/80 (BP Location: Right Arm, Patient Position: Sitting, Cuff Size: Normal)   Pulse 80   Temp (!) 97.3 F (36.3 C) (Temporal)   Resp 18   Ht 5\' 10"  (1.778 m)   Wt 198 lb 3.2 oz (89.9 kg)   SpO2 97%   BMI 28.44 kg/m  Wt Readings from Last 3 Encounters:  11/23/19 198 lb 3.2 oz (89.9 kg)  11/07/19 197 lb 12.8 oz (89.7 kg)  10/18/19 200 lb (90.7 kg)     Lab  Results  Component Value Date   WBC 5.3 11/07/2019   HGB 14.3 11/07/2019   HCT 43.6 11/07/2019   PLT 113.0 (L) 11/07/2019   GLUCOSE 100 (H) 11/07/2019   CHOL 207 (H) 03/19/2018   TRIG 111 10/18/2019   HDL 33 (L) 03/19/2018   LDLDIRECT 141.0 06/11/2016   LDLCALC 150 (H) 03/19/2018   ALT 50 (H) 10/23/2019   AST 30 10/23/2019   NA 138 11/07/2019   K 4.7 11/07/2019   CL 106 11/07/2019   CREATININE 1.20 11/07/2019   BUN 14 11/07/2019   CO2 29 11/07/2019   PSA 0.68 06/11/2016   INR 1.01 03/18/2018   HGBA1C 5.7 (H) 03/18/2018    DG Chest 2 View  Result Date: 11/07/2019 CLINICAL DATA:  Follow-up COVID-19 pneumonia for more than 21 days, cramping in hands and feet EXAM: CHEST - 2 VIEW COMPARISON:  10/18/2019 FINDINGS: Normal heart size, mediastinal contours, and pulmonary vascularity. Patchy BILATERAL pulmonary infiltrates consistent with multifocal pneumonia, increased since prior study. No pleural effusion or pneumothorax. Osseous structures unremarkable. IMPRESSION: Increased BILATERAL pulmonary infiltrates consistent with multifocal pneumonia and history of COVID-19. Electronically Signed   By: Lavonia Dana M.D.   On: 11/07/2019 12:48     Assessment & Plan:  Plan  I have discontinued Otila Back. Agro "Robert"'s azithromycin. I am also having him maintain his vitamin B-12, lansoprazole, Metamucil Free & Natural, and loratadine.  No orders of the defined types were placed in this encounter.   Problem List Items Addressed This Visit      Unprioritized   Pneumonia due to COVID-19 virus - Primary   Relevant Orders   DG Chest 2 View      Follow-up: No follow-ups on file.  Ann Held, DO

## 2019-11-24 ENCOUNTER — Telehealth: Payer: Self-pay

## 2019-11-28 DIAGNOSIS — E785 Hyperlipidemia, unspecified: Secondary | ICD-10-CM | POA: Diagnosis not present

## 2019-11-28 DIAGNOSIS — J9601 Acute respiratory failure with hypoxia: Secondary | ICD-10-CM | POA: Diagnosis not present

## 2019-11-28 DIAGNOSIS — U071 COVID-19: Secondary | ICD-10-CM | POA: Diagnosis not present

## 2019-11-28 DIAGNOSIS — J1282 Pneumonia due to coronavirus disease 2019: Secondary | ICD-10-CM | POA: Diagnosis not present

## 2019-11-28 DIAGNOSIS — N1831 Chronic kidney disease, stage 3a: Secondary | ICD-10-CM | POA: Diagnosis not present

## 2019-11-28 DIAGNOSIS — D696 Thrombocytopenia, unspecified: Secondary | ICD-10-CM | POA: Diagnosis not present

## 2019-11-29 DIAGNOSIS — U071 COVID-19: Secondary | ICD-10-CM | POA: Diagnosis not present

## 2019-12-07 ENCOUNTER — Ambulatory Visit (INDEPENDENT_AMBULATORY_CARE_PROVIDER_SITE_OTHER): Payer: Medicare Other | Admitting: Internal Medicine

## 2019-12-07 ENCOUNTER — Other Ambulatory Visit: Payer: Self-pay

## 2019-12-07 ENCOUNTER — Encounter: Payer: Self-pay | Admitting: Internal Medicine

## 2019-12-07 DIAGNOSIS — R0600 Dyspnea, unspecified: Secondary | ICD-10-CM | POA: Diagnosis not present

## 2019-12-07 DIAGNOSIS — R0609 Other forms of dyspnea: Secondary | ICD-10-CM

## 2019-12-07 DIAGNOSIS — J1282 Pneumonia due to coronavirus disease 2019: Secondary | ICD-10-CM

## 2019-12-07 DIAGNOSIS — U071 COVID-19: Secondary | ICD-10-CM | POA: Diagnosis not present

## 2019-12-07 HISTORY — DX: Dyspnea, unspecified: R06.00

## 2019-12-07 HISTORY — DX: Other forms of dyspnea: R06.09

## 2019-12-07 LAB — D-DIMER, QUANTITATIVE: D-Dimer, Quant: 0.96 mcg/mL FEU — ABNORMAL HIGH (ref <0.50)

## 2019-12-07 MED ORDER — PANTOPRAZOLE SODIUM 40 MG PO TBEC: DELAYED_RELEASE_TABLET | ORAL | 2 refills | Status: DC

## 2019-12-07 NOTE — Progress Notes (Signed)
Anthony King, male    DOB: July 04, 1950,   MRN: [id number]   Brief patient profile:  36 yowm quit smoking 1993 very healthy upholstered furniture then acutely ill with covid 19   Admit date: 10/18/2019 Discharge date: 10/25/2019  Admitted From: home Disposition:  home  Recommendations for Outpatient Follow-up:  1. Follow up with PCP in 1-2 weeks 2. Please obtain BMP/CBC in one week 3. Please follow up on the following pending results:  Home Health:Yes  Equipment/Devices: none  Discharge Condition: Stable Code Status: FULL Diet recommendation: Heart Healthy  Brief/Interim Summary: 69 y.o.M with CKD IIIa, GERD who presented with 1 week progressive SOB and hypoxia to 90% at home.In the ER, CXR showed bilateral infiltrates and patient was hypoxic. He was found to be positive for COVID-19.  [facility] were consulted to admit the patient for further evaluation and care.  Patient completed  IV remdesevir x 5 days and on steroids.Hereceived IV lasix for a few days. This has now been stopped.  Was managed with current Covid protocol.  He has not significantly improved.  Today he was able to, with oxygen on room air saturating 90% on ambulation on room air 86% placed on 2 L nasal cannula and improved to 94%. He denies any shortness of breath chest pain nausea vomiting fever chills.  He is tolerating diet well. He is anxious and wants to go home today. Reports his wife and other family numbers were tested positive but they are doing fine. Plan of care was discussed with the patient's wife okay for discharge home today. Home health PT and home o2 will be set up on discharge.   Assessment & Plan: Hypoxic respiratory failure due to COVID-19 pneumonia /pneumonia due to COVID-19 virus: Completed remdesivir on 4/25.he was on high flow nasal cannula and has been weaned down to 2 L and today came off the oxygen and doing well on room air but needing oxygen while on  ambulation.  He feels fine is persistent on going home today.  We will discharge him on oral Decadron to complete the course, and also home oxygen and home health PT.  He needs to continue with the 21 days of quarantine since disease onset        History of Present Illness  12/07/2019  Pulmonary/ 1st office eval/Anthony King stopped 02 because sats ok at rest  Due to get covid 2nd shot June 21  Chief Complaint  Patient presents with  . Consult    post covid SOB, covid in 05/21  Dyspnea:  mb is 640 ft minimal incline  Cough: none Sleep: on 2 pillows able to sleep thru the night - baseline 1 pillow SABA use: p walked thru house am of ov (never re challenges or prechallenges.  No obvious day to day or daytime variability or assoc excess/ purulent sputum or mucus plugs or hemoptysis or cp or chest tightness, subjective wheeze or overt sinus or hb symptoms.   Sleeping as above without nocturnal  or early am exacerbation  of respiratory  c/o's or need for noct saba. Also denies any obvious fluctuation of symptoms with weather or environmental changes or other aggravating or alleviating factors except as outlined above   No unusual exposure hx or h/o childhood pna/ asthma or knowledge of premature birth.  Current Allergies, Complete Past Medical History, Past Surgical History, Family History, and Social History were reviewed in [employer] record.  ROS  The following are not active complaints  unless bolded Hoarseness, sore throat, dysphagia, dental problems, itching, sneezing,  nasal congestion or discharge of excess mucus or purulent secretions, ear ache,   fever, chills, sweats, unintended wt loss or wt gain, classically pleuritic or exertional cp,  orthopnea pnd or arm/hand swelling  or leg swelling, presyncope, palpitations, abdominal pain, anorexia, nausea, vomiting, diarrhea  or change in bowel habits or change in bladder habits, change in stools or change in urine, dysuria,  hematuria,  rash, arthralgias, visual complaints, headache, numbness, weakness or ataxia or problems with walking or coordination,  change in mood or  memory.           Past Medical History:  Diagnosis Date  . Arthritis 07-17-11   hands, hips, knees  . Basal cell carcinoma    nose  . Chronic kidney disease 07-17-11   past kidney stone x1  . Colon polyps    ADENOMATOUS AND HYPERPLASTIC POLYPS  . Diverticulosis   . Gastric ulcer   . GERD (gastroesophageal reflux disease)   . Hemorrhoids   . Hyperlipidemia   . Wears glasses     Outpatient Medications Prior to Visit  Medication Sig Dispense Refill  . albuterol (VENTOLIN HFA) 108 (90 Base) MCG/ACT inhaler Inhale 2 puffs into the lungs every 6 (six) hours as needed for wheezing or shortness of breath. 18 g 1  . lansoprazole (PREVACID) 30 MG capsule Take 30 mg by mouth daily at 12 noon.    . loratadine (CLARITIN) 10 MG tablet Take 10 mg by mouth daily.    . Psyllium (METAMUCIL FREE & NATURAL) 43 % POWD Take 2 Units by mouth daily. Metamucil 2 teaspoons    . vitamin B-12 (CYANOCOBALAMIN) 1000 MCG tablet Take 1,000 mcg by mouth daily.         Objective:     BP 122/72 (BP Location: Left Arm, Cuff Size: Normal)   Pulse 82   Temp (!) 97.3 F (36.3 C) (Oral)   Ht 5\' 11"  (1.803 m)   Wt 208 lb (94.3 kg)   SpO2 95% Comment: RA  BMI 29.01 kg/m   SpO2: 95 % (RA)    Pleasant amb wm nad    HEENT : pt wearing mask not removed for exam due to covid -19 concerns.    NECK :  without JVD/Nodes/TM/ nl carotid upstrokes bilaterally   LUNGS: no acc muscle use,  Nl contour chest which is clear to A and P bilaterally without cough on insp or exp maneuvers   CV:  RRR  no s3 or murmur or increase in P2, and no edema   ABD:  soft and nontender with nl inspiratory excursion in the supine position. No bruits or organomegaly appreciated, bowel sounds nl  MS:  Nl gait/ ext warm without deformities, calf tenderness, cyanosis or clubbing No  obvious joint restrictions   SKIN: warm and dry without lesions    NEURO:  alert, approp, nl sensorium with  no motor or cerebellar deficits apparent.     I personally reviewed images and agree with radiology impression as follows:  CXR:   11/23/19  Diffuse bilateral interstitial prominence consistent with pneumonitis again noted. Slight improvement from prior exam.   Labs ordered/ reviewed:      Chemistry      Component Value Date/Time   NA 138 12/07/2019 1618   K 4.3 12/07/2019 1618   CL 106 12/07/2019 1618   CO2 28 12/07/2019 1618   BUN 14 12/07/2019 1618   CREATININE 1.32 12/07/2019 1618  Component Value Date/Time   CALCIUM 9.2 12/07/2019 1618   ALKPHOS 66 10/23/2019 0405   AST 30 10/23/2019 0405   ALT 50 (H) 10/23/2019 0405   BILITOT 1.0 10/23/2019 0405        Lab Results  Component Value Date   WBC 7.3 12/07/2019   HGB 13.7 12/07/2019   HCT 41.9 12/07/2019   MCV 80.3 12/07/2019   PLT 177.0 12/07/2019       EOS                                                               0.2                                     12/07/2019   Lab Results  Component Value Date   DDIMER 0.96 (H) 12/07/2019         Lab Results  Component Value Date   PROBNP 58.0 12/07/2019       Lab Results  Component Value Date   ESRSEDRATE 5 12/07/2019     CRP  12/07/2019    < 1.0           Assessment   No problem-specific Assessment & Plan notes found for this encounter.     Christinia Gully, MD 12/07/2019

## 2019-12-07 NOTE — Patient Instructions (Addendum)
Continue prevacid 30 mg Take 30- 60 min before your first and last meals of the day   GERD (REFLUX)  is an extremely common cause of respiratory symptoms just like yours , many times with no obvious heartburn at all.    It can be treated with medication, but also with lifestyle changes including elevation of the head of your bed (ideally with 6 -8inch blocks under the headboard of your bed),  Smoking cessation, avoidance of late meals, excessive alcohol, and avoid fatty foods, chocolate, peppermint, colas, red wine, and acidic juices such as orange juice.  NO MINT OR MENTHOL PRODUCTS SO NO COUGH DROPS  USE SUGARLESS CANDY INSTEAD (Jolley ranchers or Stover's or Life Savers) or even ice chips will also do - the key is to swallow to prevent all throat clearing. NO OIL BASED VITAMINS - use powdered substitutes.  Avoid fish oil when coughing.    Please remember to go to the lab department   for your tests - we will call you with the results when they are available.  Please schedule a follow up office visit in 6 weeks, call sooner if needed

## 2019-12-08 ENCOUNTER — Encounter: Payer: Self-pay | Admitting: Internal Medicine

## 2019-12-08 LAB — CBC WITH DIFFERENTIAL/PLATELET
Basophils Absolute: 0.1 10*3/uL (ref 0.0–0.1)
Basophils Relative: 1.1 % (ref 0.0–3.0)
Eosinophils Absolute: 0.2 10*3/uL (ref 0.0–0.7)
Eosinophils Relative: 3.2 % (ref 0.0–5.0)
HCT: 41.9 % (ref 39.0–52.0)
Hemoglobin: 13.7 g/dL (ref 13.0–17.0)
Lymphocytes Relative: 25.1 % (ref 12.0–46.0)
Lymphs Abs: 1.8 10*3/uL (ref 0.7–4.0)
MCHC: 32.8 g/dL (ref 30.0–36.0)
MCV: 80.3 fl (ref 78.0–100.0)
Monocytes Absolute: 0.9 10*3/uL (ref 0.1–1.0)
Monocytes Relative: 12 % (ref 3.0–12.0)
Neutro Abs: 4.3 10*3/uL (ref 1.4–7.7)
Neutrophils Relative %: 58.6 % (ref 43.0–77.0)
Platelets: 177 10*3/uL (ref 150.0–400.0)
RBC: 5.21 Mil/uL (ref 4.22–5.81)
RDW: 19 % — ABNORMAL HIGH (ref 11.5–15.5)
WBC: 7.3 10*3/uL (ref 4.0–10.5)

## 2019-12-08 LAB — BASIC METABOLIC PANEL
BUN: 14 mg/dL (ref 6–23)
CO2: 28 mEq/L (ref 19–32)
Calcium: 9.2 mg/dL (ref 8.4–10.5)
Chloride: 106 mEq/L (ref 96–112)
Creatinine, Ser: 1.32 mg/dL (ref 0.40–1.50)
GFR: 53.77 mL/min — ABNORMAL LOW (ref >60.00)
Glucose, Bld: 95 mg/dL (ref 70–99)
Potassium: 4.3 mEq/L (ref 3.5–5.1)
Sodium: 138 mEq/L (ref 135–145)

## 2019-12-08 LAB — BRAIN NATRIURETIC PEPTIDE: Pro B Natriuretic peptide (BNP): 58 pg/mL (ref 0.0–100.0)

## 2019-12-08 LAB — SEDIMENTATION RATE: Sed Rate: 5 mm/hr (ref 0–20)

## 2019-12-08 LAB — C-REACTIVE PROTEIN: CRP: <1 mg/dL (ref 0.5–20.0)

## 2019-12-08 NOTE — Progress Notes (Signed)
Left a VM with his results and encouraged to call the office with any questions.

## 2019-12-08 NOTE — Assessment & Plan Note (Addendum)
Onset with covid 19 pna  - see adimit 10/18/19  -  12/07/2019   Walked RA  3 laps @ approx 227ft each @ slow slt unsteady  pace  stopped due to sob / weakness but sats never less than 92%   Symptoms are   disproportionate to objective findings and not clear to what extent this is actually a pulmonary  problem but pt does appear to have difficult to sort out respiratory symptoms of unknown origin for which  DDX  = almost all start with A and  include Adherence, Ace Inhibitors, Acid Reflux, Active Sinus Disease, Alpha 1 Antitripsin deficiency, Anxiety masquerading as Airways dz,  ABPA,  Allergy(esp in young), Aspiration (esp in elderly), Adverse effects of meds,  Active smoking or Vaping, A bunch of PE's/clot burden (a few small clots can't cause this syndrome unless there is already severe underlying pulm or vascular dz with poor reserve),  Anemia or thyroid disorder, plus two Bs  = Bronchiectasis and Beta blocker use..and one C= CHF   Adherence is always the initial "prime suspect" and is a multilayered concern that requires a "trust but verify" approach in every patient - starting with knowing how to use medications, especially inhalers, correctly, keeping up with refills and understanding the fundamental difference between maintenance and prns vs those medications only taken for a very short course and then stopped and not refilled.   ? Acid (or non-acid) GERD > always difficult to exclude as up to 75% of pts in some series report no assoc GI/ Heartburn symptoms> rec max (24h)  acid suppression and diet restrictions/ reviewed and instructions given in writing.   ? Allergy/ asthma new onset - I spent extra time with pt today reviewing appropriate use of albuterol for prn use on exertion with the following points: 1) saba is for relief of sob that does not improve by walking a slower pace or resting but rather if the pt does not improve after trying this first. 2) If the pt is convinced, as many are, that  saba helps recover from activity faster then it's easy to tell if this is the case by re-challenging : ie stop, take the inhaler, then p 5 minutes try the exact same activity (intensity of workload) that just caused the symptoms and see if they are substantially diminished or not after saba 3) if there is an activity that reproducibly causes the symptoms, try the saba 15 min before the activity on alternate days   If in fact the saba really does help, then fine to continue to use it prn but advised may need to look closer at the maintenance regimen being used to achieve better control of airways disease with exertion.    ? Anxiety/depression/ deconditioning > usually at the bottom of this list of usual suspects but note he has been thru a lot in past 6 weeks and   may interfere with adherence and also interpretation of response or lack thereof to symptom management which can be quite subjective.   ? Anemia, thyroid dz> excluded   ?  A bunch of PE's > D dimer high nl for age  but trending down - while high normal value (seen commonly in the elderly or chronically ill or in this case post covid)   may miss small peripheral pe, the clot burden with sob is moderately high and the d dimer this "low"  has a relatively  high neg pred value if used in this setting.   ?  Occult chf  > excluded with bnp so low/ cxr x CM          Each maintenance medication was reviewed in detail including emphasizing most importantly the difference between maintenance and prns and under what circumstances the prns are to be triggered using an action plan format where appropriate.  Total time for H and P, chart review, counseling, reviewing how/ when to use hfa device/  directly observing portions of ambulatory 02 saturation study/  and generating customized AVS unique to this office visit / charting = 60 min

## 2019-12-08 NOTE — Assessment & Plan Note (Addendum)
Admit 10/18/19 rx remdesivir and dex / d/c on 02 - no desats walking at f/u ov 12/07/2019   Clearly on the right track but understandably frustrated he hasn't made more progress.  Advised might consider another round of steroids to expedite the healing if there is still evidence of ongoing inflammation (with ESR and CRP so low I doubt it)  Discussed in detail all the  indications, usual  risks and alternatives  relative to the benefits with patient who agrees to proceed with conservative rx for now with regular paced submaximal ex and f/u in 6 weeks.

## 2019-12-10 ENCOUNTER — Encounter: Payer: Self-pay | Admitting: Internal Medicine

## 2019-12-18 ENCOUNTER — Ambulatory Visit: Payer: Medicare Other | Attending: Internal Medicine

## 2019-12-18 DIAGNOSIS — Z23 Encounter for immunization: Secondary | ICD-10-CM

## 2019-12-18 NOTE — Progress Notes (Signed)
   Covid-19 Vaccination Clinic  Name:  Anthony King    MRN: [id number] DOB: 1950-07-10  12/18/2019  Anthony King was observed post Covid-19 immunization for 15 minutes without incident. He was provided with Vaccine Information Sheet and instruction to access the V-Safe system.   Anthony King was instructed to call 911 with any severe reactions post vaccine: Marland Kitchen Difficulty breathing  . Swelling of face and throat  . A fast heartbeat  . A bad rash all over body  . Dizziness and weakness   Immunizations Administered    Name Date Dose VIS Date Route   Pfizer COVID-19 Vaccine 12/18/2019  8:20 AM 0.3 mL 08/23/2018 Intramuscular   Manufacturer: [employer]   Lot: [id number]   [place]: [id number]

## 2019-12-18 NOTE — Progress Notes (Signed)
Cardiology Office Note    Date:  12/20/2019   ID:  Anthony King, DOB [birth year], MRN [id number]  PCP:  Carollee Herter, Alferd Apa, DO  Cardiologist: Lauree Chandler, MD EPS: None  Chief Complaint  Patient presents with  . Follow-up    History of Present Illness:  Anthony King is a 69 y.o. male with history of chest pain cardiac cath 2019 nonobstructive CAD with 40% second diagonal, hyperlipidemia, CKD stage III  Patient is being followed by Dr. Melvyn Novas for COVID-19 pneumonia since 09/2019.  Patient comes in for f/u. Very weak and short of breath since Covid 19 pneumonia. Lost his mom to [phone] in Dec. Depressed and tearful because he can't sing in choir anymore. Sharp pain starts under his shoulder blades that goes in the center of his chest. Sometimes he can grab his chest and make it "pop" which makes it feel better. Yesterday was trying to reupholster a chair and became short of breath-HR up to 111 but came down quickly with rest. HR usually 89/m. Lost 20 lbs.   Past Medical History:  Diagnosis Date  . Aortic atherosclerosis ([place]) 03/19/2018  . Arthritis 07-17-11   hands, hips, knees  . Basal cell carcinoma    nose  . CAD (coronary artery disease), native coronary artery 03/19/2018  . Chronic kidney disease 07-17-11   past kidney stone x1  . CKD (chronic kidney disease), stage III 03/18/2018  . Colon polyps    ADENOMATOUS AND HYPERPLASTIC POLYPS  . Diverticulosis   . DOE (dyspnea on exertion) 12/07/2019   Onset with covid 19 pna  - see adimit 10/18/19  -  12/07/2019   Walked RA  3 laps @ approx 227ft each @ slow slt unsteady  pace  stopped due to sob / weakness but sats never less than 92%   . Gastric ulcer   . GERD (gastroesophageal reflux disease)   . Hemorrhoids   . Hyperlipidemia   . Hypoxia 10/18/2019  . Left lower quadrant abdominal pain 05/31/2018  . Prediabetes 03/19/2018  . Unstable angina ([place]) 03/19/2018  . Wears glasses     Past Surgical History:    Procedure Laterality Date  . APPENDECTOMY    . BASAL CELL CARCINOMA EXCISION     nose  . COLONOSCOPY    . fatty tumors  07-17-11   excised x4- 1 -neck, 1 arm, 2 chest  . HEMORRHOID BANDING    . INGUINAL HERNIA REPAIR  07/23/2011   Procedure: LAPAROSCOPIC INGUINAL HERNIA;  Surgeon: Stark Klein, MD;  Location: [facility];  Service: General;  Laterality: Left;  . INGUINAL HERNIA REPAIR Bilateral 04/19/2019   Procedure: BILATERAL OPEN INGUINAL HERNIA REPAIR WITH MESH;  Surgeon: Erroll Luna, MD;  Location: [facility];  Service: General;  Laterality: Bilateral;  . LEFT HEART CATH AND CORONARY ANGIOGRAPHY N/A 03/21/2018   Procedure: LEFT HEART CATH AND CORONARY ANGIOGRAPHY;  Surgeon: Jettie Booze, MD;  Location: [facility];  Service: Cardiovascular;  Laterality: N/A;  . MASS EXCISION N/A 08/29/2013   Procedure: EXCISION LEFT FLANK MASS/RIGHT EXCISION CHEST WALL MASS;  Surgeon: Stark Klein, MD;  Location: [facility];  Service: General;  Laterality: N/A;  Left flank  . POLYPECTOMY      Current Medications: Current Meds  Medication Sig  . albuterol (VENTOLIN HFA) 108 (90 Base) MCG/ACT inhaler Inhale 2 puffs into the lungs every 6 (six) hours as needed for wheezing or shortness of breath.  . loratadine (CLARITIN)  10 MG tablet Take 10 mg by mouth daily.  . pantoprazole (PROTONIX) 40 MG tablet Take 30- 60 min before your first and last meals of the day  . Psyllium (METAMUCIL FREE & NATURAL) 43 % POWD Take 2 Units by mouth daily. Metamucil 2 teaspoons  . vitamin B-12 (CYANOCOBALAMIN) 1000 MCG tablet Take 1,000 mcg by mouth daily.      Allergies:   Aspirin   Social History   Socioeconomic History  . Marital status: Married    Spouse name: Not on file  . Number of children: 1  . Years of education: Not on file  . Highest education level: Not on file  Occupational History  . Occupation: self-employed , covers furniture  Tobacco Use  . Smoking  status: Former Smoker    Types: Cigarettes    Quit date: 07/02/1991    Years since quitting: 28.4  . Smokeless tobacco: Never Used  Vaping Use  . Vaping Use: Never used  Substance and Sexual Activity  . Alcohol use: No  . Drug use: No  . Sexual activity: Yes  Other Topics Concern  . Not on file  Social History Narrative   Exercise- no   Social Determinants of Health   Financial Resource Strain:   . Difficulty of Paying Living Expenses:   Food Insecurity:   . Worried About Charity fundraiser in the Last Year:   . Arboriculturist in the Last Year:   Transportation Needs:   . Film/video editor (Medical):   Marland Kitchen Lack of Transportation (Non-Medical):   Physical Activity:   . Days of Exercise per Week:   . Minutes of Exercise per Session:   Stress:   . Feeling of Stress :   Social Connections:   . Frequency of Communication with Friends and Family:   . Frequency of Social Gatherings with Friends and Family:   . Attends Religious Services:   . Active Member of Clubs or Organizations:   . Attends Archivist Meetings:   Marland Kitchen Marital Status:      Family History:  The patient's family history includes Arthritis in his maternal grandmother, mother, and paternal grandmother; Breast cancer in his mother; Colon cancer in his brother and father; Stomach cancer in his father and sister.   ROS:   Please see the history of present illness.    ROS All other systems reviewed and are negative.   PHYSICAL EXAM:   VS:  BP 132/80   Pulse 73   Ht 5\' 11"  (1.803 m)   Wt 211 lb 9.6 oz (96 kg)   SpO2 93%   BMI 29.51 kg/m   Physical Exam  GEN: Well nourished, well developed, in no acute distress  Neck: no JVD, carotid bruits, or masses Cardiac:RRR; no murmurs, rubs, or gallops  Respiratory: decreased breath sounds but clear to auscultation bilaterally, normal work of breathing GI: soft, nontender, nondistended, + BS Ext: without cyanosis, clubbing, or edema, Good distal pulses  bilaterally Neuro:  Alert and Oriented x 3 Psych: euthymic mood, full affect  Wt Readings from Last 3 Encounters:  12/20/19 211 lb 9.6 oz (96 kg)  12/07/19 208 lb (94.3 kg)  11/23/19 198 lb 3.2 oz (89.9 kg)      Studies/Labs Reviewed:   EKG:  EKG is not ordered today.    Recent Labs: 10/21/2019: B Natriuretic Peptide 171.7 10/23/2019: ALT 50; Magnesium 2.6 12/07/2019: BUN 14; Creatinine, Ser 1.32; Hemoglobin 13.7; Platelets 177.0; Potassium 4.3; Pro B  Natriuretic peptide (BNP) 58.0; Sodium 138   Lipid Panel    Component Value Date/Time   CHOL 207 (H) 03/19/2018 0619   TRIG 111 10/18/2019 0807   HDL 33 (L) 03/19/2018 0619   CHOLHDL 6.3 03/19/2018 0619   VLDL 24 03/19/2018 0619   LDLCALC 150 (H) 03/19/2018 0619   LDLDIRECT 141.0 06/11/2016 1639    Additional studies/ records that were reviewed today include:   CXR 11/23/19 IMPRESSION: Diffuse bilateral interstitial prominence consistent with pneumonitis again noted. Slight improvement from prior exam.     Electronically Signed   By: Marcello Moores  Register   On: 11/23/2019 09:37       Cardiac cath 02/2018  Ost 2nd Diag to 2nd Diag lesion is 40% stenosed.  LV end diastolic pressure is normal. LVEDP 9 mm Hg.  There is no aortic valve stenosis.  Nonobstructive CAD.   Continue aggressive preventive therapy.    Consider other causes of chest pain.    03/19/18 Echo  Study Conclusions   - Left ventricle: The cavity size was normal. Wall thickness was   increased in a pattern of mild LVH. Systolic function was normal.   The estimated ejection fraction was in the range of 55% to 60%.   Wall motion was normal; there were no regional wall motion   abnormalities. - Aortic valve: There was mild to moderate regurgitation. - Pulmonary arteries: Systolic pressure was mildly increased. PA   peak pressure: 35 mm Hg (S).      ASSESSMENT:    1. SOB (shortness of breath)   2. History of chest pain   3. Hyperlipidemia,  unspecified hyperlipidemia type   4. Stage 3a chronic kidney disease   5. Pneumonia due to COVID-19 virus      PLAN:  In order of problems listed above:  DOE since covid19 pneumonia-really struggling with it. Will order echo to make sure no cardiomyopathy.  History of chest pain with nonobstructive CAD on cath 2019-now having sharp shooting chest pain eases with rubbing and manipulating chest.   Hyperlipidemia not treated-will check today  CKD-stage III Crt 1.32 12/07/19  History of Covid pneumonia followed by Dr. Melvyn Novas    Medication Adjustments/Labs and Tests Ordered: Current medicines are reviewed at length with the patient today.  Concerns regarding medicines are outlined above.  Medication changes, Labs and Tests ordered today are listed in the Patient Instructions below. Patient Instructions  Medication Instructions:  Your physician recommends that you continue on your current medications as directed. Please refer to the Current Medication list given to you today.  *If you need a refill on your cardiac medications before your next appointment, please call your pharmacy*   Lab Work: TODAY: LIPIDS  If you have labs (blood work) drawn today and your tests are completely normal, you will receive your results only by: Marland Kitchen MyChart Message (if you have MyChart) OR . A paper copy in the mail If you have any lab test that is abnormal or we need to change your treatment, we will call you to review the results.   Testing/Procedures: Your physician has requested that you have an echocardiogram. Echocardiography is a painless test that uses sound waves to create images of your heart. It provides your doctor with information about the size and shape of your heart and how well your heart's chambers and valves are working. This procedure takes approximately one hour. There are no restrictions for this procedure.  Follow-Up: At [facility], you and your health needs are  our priority.   As part of our continuing mission to provide you with exceptional heart care, we have created designated Provider Care Teams.  These Care Teams include your primary Cardiologist (physician) and Advanced Practice Providers (APPs -  Physician Assistants and Nurse Practitioners) who all work together to provide you with the care you need, when you need it.  We recommend signing up for the patient portal called "MyChart".  Sign up information is provided on this After Visit Summary.  MyChart is used to connect with patients for Virtual Visits (Telemedicine).  Patients are able to view lab/test results, encounter notes, upcoming appointments, etc.  Non-urgent messages can be sent to your provider as well.   To learn more about what you can do with MyChart, go to [email or web address].    Your next appointment:   12 month(s)  The format for your next appointment:   In Person  Provider:   You may see Lauree Chandler, MD or one of the following Advanced Practice Providers on your designated Care Team:    Melina Copa, PA-C  Ermalinda Barrios, PA-C    Other Instructions None     Signed, Ermalinda Barrios, PA-C  12/20/2019 10:12 AM    [address], [place], [place]  [address] Phone: [phone]; Fax: [phone]

## 2019-12-20 ENCOUNTER — Other Ambulatory Visit: Payer: Self-pay

## 2019-12-20 ENCOUNTER — Ambulatory Visit (INDEPENDENT_AMBULATORY_CARE_PROVIDER_SITE_OTHER): Payer: Medicare Other | Admitting: Physician Assistant

## 2019-12-20 ENCOUNTER — Encounter: Payer: Self-pay | Admitting: Physician Assistant

## 2019-12-20 VITALS — BP 132/80 | HR 73 | Ht 71.0 in | Wt 211.6 lb

## 2019-12-20 DIAGNOSIS — U071 COVID-19: Secondary | ICD-10-CM

## 2019-12-20 DIAGNOSIS — Z87898 Personal history of other specified conditions: Secondary | ICD-10-CM | POA: Diagnosis not present

## 2019-12-20 DIAGNOSIS — E785 Hyperlipidemia, unspecified: Secondary | ICD-10-CM

## 2019-12-20 DIAGNOSIS — N1831 Chronic kidney disease, stage 3a: Secondary | ICD-10-CM | POA: Diagnosis not present

## 2019-12-20 DIAGNOSIS — J1282 Pneumonia due to coronavirus disease 2019: Secondary | ICD-10-CM

## 2019-12-20 DIAGNOSIS — R0602 Shortness of breath: Secondary | ICD-10-CM

## 2019-12-20 LAB — LIPID PANEL
Chol/HDL Ratio: 5.8 ratio — ABNORMAL HIGH (ref 0.0–5.0)
Cholesterol, Total: 232 mg/dL — ABNORMAL HIGH (ref 100–199)
HDL: 40 mg/dL (ref >39)
LDL Chol Calc (NIH): 173 mg/dL — ABNORMAL HIGH (ref 0–99)
Triglycerides: 104 mg/dL (ref 0–149)
VLDL Cholesterol Cal: 19 mg/dL (ref 5–40)

## 2019-12-20 NOTE — Patient Instructions (Signed)
Medication Instructions:  Your physician recommends that you continue on your current medications as directed. Please refer to the Current Medication list given to you today.  *If you need a refill on your cardiac medications before your next appointment, please call your pharmacy*   Lab Work: TODAY: LIPIDS  If you have labs (blood work) drawn today and your tests are completely normal, you will receive your results only by: Marland Kitchen MyChart Message (if you have MyChart) OR . A paper copy in the mail If you have any lab test that is abnormal or we need to change your treatment, we will call you to review the results.   Testing/Procedures: Your physician has requested that you have an echocardiogram. Echocardiography is a painless test that uses sound waves to create images of your heart. It provides your doctor with information about the size and shape of your heart and how well your heart's chambers and valves are working. This procedure takes approximately one hour. There are no restrictions for this procedure.  Follow-Up: At [facility], you and your health needs are our priority.  As part of our continuing mission to provide you with exceptional heart care, we have created designated Provider Care Teams.  These Care Teams include your primary Cardiologist (physician) and Advanced Practice Providers (APPs -  Physician Assistants and Nurse Practitioners) who all work together to provide you with the care you need, when you need it.  We recommend signing up for the patient portal called "MyChart".  Sign up information is provided on this After Visit Summary.  MyChart is used to connect with patients for Virtual Visits (Telemedicine).  Patients are able to view lab/test results, encounter notes, upcoming appointments, etc.  Non-urgent messages can be sent to your provider as well.   To learn more about what you can do with MyChart, go to [email or web address].    Your next appointment:   12  month(s)  The format for your next appointment:   In Person  Provider:   You may see Lauree Chandler, MD or one of the following Advanced Practice Providers on your designated Care Team:    Melina Copa, PA-C  Ermalinda Barrios, PA-C    Other Instructions None

## 2019-12-22 ENCOUNTER — Telehealth: Payer: Self-pay | Admitting: Physician Assistant

## 2019-12-22 DIAGNOSIS — Z79899 Other long term (current) drug therapy: Secondary | ICD-10-CM

## 2019-12-22 NOTE — Telephone Encounter (Signed)
Patient returned call for his lab results, he said a VM can be left.

## 2019-12-25 MED ORDER — ATORVASTATIN CALCIUM 40 MG PO TABS
40.0000 mg | ORAL_TABLET | Freq: Every day | ORAL | 3 refills | Status: DC
Start: 2019-12-25 — End: 2020-07-26

## 2019-12-25 NOTE — Telephone Encounter (Signed)
Imogene Burn, PA-C  12/21/2019 9:04 AM EDT     LDL 173 which is quite high. He had a 40% blockage on cath in 2019 which we don't want to progress. Start lipitor 40 mg once daily. Repeat flp and lft's in 3 months thanks.     The patient has been notified of the result and verbalized understanding.  All questions (if any) were answered. Wilma Flavin, RN 12/25/2019 11:42 AM    Rx for 40mg  once /day sent to [facility] Pt aware of lab appt on 03/18/20 for f/u

## 2019-12-25 NOTE — Telephone Encounter (Signed)
Bernerd Pho, Did you give patient his results? Thanks, Selinda Eon

## 2019-12-25 NOTE — Telephone Encounter (Signed)
Pt called back for his lab results. Pt asks that you leave a message on his answering machine or speak with his wife Olegario Shearer who is on his DPR.

## 2019-12-25 NOTE — Telephone Encounter (Signed)
Anthony King,  No I was in triage that day so I routed it to [place]. Looks like she left him a VM.

## 2020-01-12 ENCOUNTER — Other Ambulatory Visit: Payer: Self-pay

## 2020-01-12 ENCOUNTER — Ambulatory Visit (HOSPITAL_COMMUNITY): Payer: Medicare Other | Attending: Cardiovascular Disease

## 2020-01-12 DIAGNOSIS — R0602 Shortness of breath: Secondary | ICD-10-CM

## 2020-01-12 DIAGNOSIS — U071 COVID-19: Secondary | ICD-10-CM

## 2020-01-12 DIAGNOSIS — J1282 Pneumonia due to coronavirus disease 2019: Secondary | ICD-10-CM | POA: Insufficient documentation

## 2020-01-12 DIAGNOSIS — Z87898 Personal history of other specified conditions: Secondary | ICD-10-CM

## 2020-01-12 LAB — ECHOCARDIOGRAM COMPLETE
Area-P 1/2: 3.99 cm2
P 1/2 time: 471 msec
S' Lateral: 3.1 cm

## 2020-01-26 ENCOUNTER — Ambulatory Visit: Payer: Self-pay | Admitting: Surgery

## 2020-01-26 DIAGNOSIS — R1031 Right lower quadrant pain: Secondary | ICD-10-CM | POA: Diagnosis not present

## 2020-01-26 DIAGNOSIS — R1032 Left lower quadrant pain: Secondary | ICD-10-CM | POA: Diagnosis not present

## 2020-01-26 NOTE — H&P (Signed)
Sibyl Parr Appointment: 01/26/2020 10:40 AM Location: [facility] Patient #: [id number] DOB: 01-10-1951 Married / Language: Anthony King / Race: White Male  History of Present Illness Anthony Moores A. Kyri Shader MD; 01/26/2020 1:45 PM) Patient words: Patient returns for chronic bilateral groin pain right much worse than left after history of autoimmune hernia. He had repair of both hernias with the right side being recurrent in October 2020. He has had issues with burning pain along the incision lower on the right than left. Left-sided pain she's quite minimal in his opinion. He underwent imaging in March 2021 which showed no significant complication issue. There is some mention of a possible left recurrent hernia which upon my review appeared to be fatty tissue        History of bilateral inguinal hernia repair October 2020. Inguinal pain with burning.  EXAM: CT ABDOMEN AND PELVIS WITH CONTRAST  TECHNIQUE: Multidetector CT imaging of the abdomen and pelvis was performed using the standard protocol following bolus administration of intravenous contrast.  CONTRAST: 165mL ISOVUE-300 IOPAMIDOL (ISOVUE-300) INJECTION 61%  COMPARISON: 10/10/2018  FINDINGS: Lower chest: Lung bases are clear. No effusions. Heart is normal size.  Hepatobiliary: Gallstone noted within the gallbladder. No focal hepatic abnormality or biliary ductal dilatation.  Pancreas: No focal abnormality or ductal dilatation.  Spleen: No focal abnormality. Normal size.  Adrenals/Urinary Tract: Small parapelvic cysts on the left. No hydronephrosis. No renal or adrenal mass. Urinary bladder unremarkable.  Stomach/Bowel: Moderate stool burden. Scattered sigmoid diverticula. No evidence of bowel obstruction. Prior appendectomy.  Vascular/Lymphatic: No evidence of aneurysm or adenopathy.  Reproductive: No visible focal abnormality.  Other: Continues to be fat seen within the right inguinal  canal, similar prior study. Decreasing fat within the left inguinal canal. Postoperative stranding seen within the subcutaneous soft tissues in the inguinal regions bilaterally. No fluid collections.  Musculoskeletal: No acute bony abnormality.  IMPRESSION: Cholelithiasis.  Continued fat noted within the right inguinal canal. Decreasing fat in the left inguinal canal. No postoperative complicating feature.  No acute findings.   Electronically Signed By: Rolm Baptise M.D.      Medical management has been tried with no success. The right groin pain is becoming debilitating. Left groin seems to stabilize he states he can live with the left groin pain as is. He is still quite active at work this is impeding his ability to work.  The patient is a 69 year old male.   Allergies Darden Palmer, [place]; 01/26/2020 10:49 AM) No Known Drug Allergies [03/24/2019]: Allergies Reconciled  Medication History Darden Palmer, [place]; 01/26/2020 10:49 AM) Neurontin (300MG  Capsule, 1 (one) Oral three times daily, as needed for pain, Taken starting 05/30/2019) Active. Lansoprazole (30MG  Tab DR Disint, Oral) Active. Pantoprazole Sodium (40MG  Tablet DR, Oral) Active. Albuterol Sulfate HFA (108 (90 Base)MCG/ACT Aerosol Soln, Inhalation) Active. Albuterol (90MCG/ACT Aerosol Soln, Inhalation) Active. Psyllium (0.36GM Capsule, Oral) Active. B Complex-B12 (Oral) Active. Multivitamin (Oral) Active. Vitamin B-12 (1000MCG Tablet, 1 (one) Oral) Active. Medications Reconciled    Vitals Lattie Haw Irvine RMA; 01/26/2020 10:49 AM) 01/26/2020 10:49 AM Weight: 214.13 lb Height: 70in Body Surface Area: 2.15 m Body Mass Index: 30.72 kg/m  Temp.: 40F  Pulse: 86 (Regular)  P.OX: 97% (Room air) BP: 124/74(Sitting, Left Arm, Standard)        Physical Exam (Mirely Pangle A. Alexx Giambra MD; 01/26/2020 1:46 PM)  General Mental Status-Alert. General Appearance-Consistent with stated  age. Hydration-Well hydrated. Voice-Normal.  Cardiovascular Cardiovascular examination reveals -normal heart sounds, regular rate and rhythm with no murmurs  and normal pedal pulses bilaterally.  Abdomen Note: Tender along the right scar inguinal canal with paresthesias noted. Left groin is not tender today. No evidence of hernia recurrence bilaterally.His pain is centered along the scar in the right groin. Both testicles were nontender today.  Neurologic Neurologic evaluation reveals -alert and oriented x 3 with no impairment of recent or remote memory. Mental Status-Normal.  Musculoskeletal Normal Exam - Left-Upper Extremity Strength Normal and Lower Extremity Strength Normal. Normal Exam - Right-Upper Extremity Strength Normal and Lower Extremity Strength Normal.    Assessment & Plan (Cass Vandermeulen A. Arien Morine MD; 01/26/2020 1:50 PM)  BILATERAL GROIN PAIN (R10.31) Impression: Total time 30 minutes  On discussion about options give this complex problem. This occurs in about 10% of patients. Risk factors include recurrent hernia which he had. Of note PROGRIP mesh was used on the right side. There was not sacrificed on the right. The left side used ultrapro mesh. I discussed nerve block but unfortunately this services have been changed locally. I discussed medical management with medications and a multimodality approach. I discussed surgical options of laparoscopy with right groin exploration with mesh removal and neurectomy of all 3 nerves to include the ilioinguinal, ileal hypogastric and the genital branch of the femoral cutaneous nerve. I discussed that this is successful and about 75% of patients. I discussed the potential concern for ongoing pain which would have to be medical managed or consider a nerve stimulator. After discussion of the above he would like to proceed with surgery. It would be helpful at the nerve injected which I explained to him at this point time he does not  wish to pursue that option. He understands the potential failure rate of surgery but is quite miserable and wishes to proceed. More likely will require removal of the right groin mesh as well as neurectomy. Risk of surgery include but not exclusive of bleeding, infection, injury to adjacent organ, testicle, failure to improve pain, cardiovascular risks, anesthesia risk and recurrence  Current Plans Pt Education - [facility] Free Text Education/Instructions: discussed with patient and provided information.

## 2020-01-26 NOTE — H&P (View-Only) (Signed)
Sibyl Parr Appointment: 01/26/2020 10:40 AM Location: [facility] Patient #: [id number] DOB: 1950/11/25 Married / Language: [name] / Race: White Male  History of Present Illness Marcello Moores A. Kriti Katayama MD; 01/26/2020 1:45 PM) Patient words: Patient returns for chronic bilateral groin pain right much worse than left after history of autoimmune hernia. He had repair of both hernias with the right side being recurrent in October 2020. He has had issues with burning pain along the incision lower on the right than left. Left-sided pain she's quite minimal in his opinion. He underwent imaging in March 2021 which showed no significant complication issue. There is some mention of a possible left recurrent hernia which upon my review appeared to be fatty tissue        History of bilateral inguinal hernia repair October 2020. Inguinal pain with burning.  EXAM: CT ABDOMEN AND PELVIS WITH CONTRAST  TECHNIQUE: Multidetector CT imaging of the abdomen and pelvis was performed using the standard protocol following bolus administration of intravenous contrast.  CONTRAST: 156mL ISOVUE-300 IOPAMIDOL (ISOVUE-300) INJECTION 61%  COMPARISON: 10/10/2018  FINDINGS: Lower chest: Lung bases are clear. No effusions. Heart is normal size.  Hepatobiliary: Gallstone noted within the gallbladder. No focal hepatic abnormality or biliary ductal dilatation.  Pancreas: No focal abnormality or ductal dilatation.  Spleen: No focal abnormality. Normal size.  Adrenals/Urinary Tract: Small parapelvic cysts on the left. No hydronephrosis. No renal or adrenal mass. Urinary bladder unremarkable.  Stomach/Bowel: Moderate stool burden. Scattered sigmoid diverticula. No evidence of bowel obstruction. Prior appendectomy.  Vascular/Lymphatic: No evidence of aneurysm or adenopathy.  Reproductive: No visible focal abnormality.  Other: Continues to be fat seen within the right inguinal  canal, similar prior study. Decreasing fat within the left inguinal canal. Postoperative stranding seen within the subcutaneous soft tissues in the inguinal regions bilaterally. No fluid collections.  Musculoskeletal: No acute bony abnormality.  IMPRESSION: Cholelithiasis.  Continued fat noted within the right inguinal canal. Decreasing fat in the left inguinal canal. No postoperative complicating feature.  No acute findings.   Electronically Signed By: Rolm Baptise M.D.      Medical management has been tried with no success. The right groin pain is becoming debilitating. Left groin seems to stabilize he states he can live with the left groin pain as is. He is still quite active at work this is impeding his ability to work.  The patient is a 69 year old male.   Allergies Darden Palmer, [place]; 01/26/2020 10:49 AM) No Known Drug Allergies [03/24/2019]: Allergies Reconciled  Medication History Darden Palmer, [place]; 01/26/2020 10:49 AM) Neurontin (300MG  Capsule, 1 (one) Oral three times daily, as needed for pain, Taken starting 05/30/2019) Active. Lansoprazole (30MG  Tab DR Disint, Oral) Active. Pantoprazole Sodium (40MG  Tablet DR, Oral) Active. Albuterol Sulfate HFA (108 (90 Base)MCG/ACT Aerosol Soln, Inhalation) Active. Albuterol (90MCG/ACT Aerosol Soln, Inhalation) Active. Psyllium (0.36GM Capsule, Oral) Active. B Complex-B12 (Oral) Active. Multivitamin (Oral) Active. Vitamin B-12 (1000MCG Tablet, 1 (one) Oral) Active. Medications Reconciled    Vitals Lattie Haw Redwood RMA; 01/26/2020 10:49 AM) 01/26/2020 10:49 AM Weight: 214.13 lb Height: 70in Body Surface Area: 2.15 m Body Mass Index: 30.72 kg/m  Temp.: 73F  Pulse: 86 (Regular)  P.OX: 97% (Room air) BP: 124/74(Sitting, Left Arm, Standard)        Physical Exam (Fendi Meinhardt A. Judaea Burgoon MD; 01/26/2020 1:46 PM)  General Mental Status-Alert. General Appearance-Consistent with stated  age. Hydration-Well hydrated. Voice-Normal.  Cardiovascular Cardiovascular examination reveals -normal heart sounds, regular rate and rhythm with no murmurs  and normal pedal pulses bilaterally.  Abdomen Note: Tender along the right scar inguinal canal with paresthesias noted. Left groin is not tender today. No evidence of hernia recurrence bilaterally.His pain is centered along the scar in the right groin. Both testicles were nontender today.  Neurologic Neurologic evaluation reveals -alert and oriented x 3 with no impairment of recent or remote memory. Mental Status-Normal.  Musculoskeletal Normal Exam - Left-Upper Extremity Strength Normal and Lower Extremity Strength Normal. Normal Exam - Right-Upper Extremity Strength Normal and Lower Extremity Strength Normal.    Assessment & Plan (Mi Balla A. Necola Bluestein MD; 01/26/2020 1:50 PM)  BILATERAL GROIN PAIN (R10.31) Impression: Total time 30 minutes  On discussion about options give this complex problem. This occurs in about 10% of patients. Risk factors include recurrent hernia which he had. Of note PROGRIP mesh was used on the right side. There was not sacrificed on the right. The left side used ultrapro mesh. I discussed nerve block but unfortunately this services have been changed locally. I discussed medical management with medications and a multimodality approach. I discussed surgical options of laparoscopy with right groin exploration with mesh removal and neurectomy of all 3 nerves to include the ilioinguinal, ileal hypogastric and the genital branch of the femoral cutaneous nerve. I discussed that this is successful and about 75% of patients. I discussed the potential concern for ongoing pain which would have to be medical managed or consider a nerve stimulator. After discussion of the above he would like to proceed with surgery. It would be helpful at the nerve injected which I explained to him at this point time he does not  wish to pursue that option. He understands the potential failure rate of surgery but is quite miserable and wishes to proceed. More likely will require removal of the right groin mesh as well as neurectomy. Risk of surgery include but not exclusive of bleeding, infection, injury to adjacent organ, testicle, failure to improve pain, cardiovascular risks, anesthesia risk and recurrence  Current Plans Pt Education - [employer] Free Text Education/Instructions: discussed with patient and provided information.

## 2020-01-31 ENCOUNTER — Other Ambulatory Visit: Payer: Self-pay

## 2020-01-31 ENCOUNTER — Ambulatory Visit (INDEPENDENT_AMBULATORY_CARE_PROVIDER_SITE_OTHER): Payer: Medicare Other | Admitting: Internal Medicine

## 2020-01-31 ENCOUNTER — Encounter: Payer: Self-pay | Admitting: Internal Medicine

## 2020-01-31 ENCOUNTER — Ambulatory Visit (INDEPENDENT_AMBULATORY_CARE_PROVIDER_SITE_OTHER): Payer: Medicare Other

## 2020-01-31 DIAGNOSIS — R0609 Other forms of dyspnea: Secondary | ICD-10-CM

## 2020-01-31 DIAGNOSIS — R0600 Dyspnea, unspecified: Secondary | ICD-10-CM

## 2020-01-31 DIAGNOSIS — J1282 Pneumonia due to coronavirus disease 2019: Secondary | ICD-10-CM

## 2020-01-31 DIAGNOSIS — J984 Other disorders of lung: Secondary | ICD-10-CM | POA: Diagnosis not present

## 2020-01-31 DIAGNOSIS — U071 COVID-19: Secondary | ICD-10-CM | POA: Diagnosis not present

## 2020-01-31 NOTE — Progress Notes (Signed)
Anthony King, male    DOB: 1950/10/05   MRN: [id number]   Brief patient profile:  34 yowm quit smoking 1993 very healthy upholstered furniture then acutely ill with covid 19 admit [facility]    Admit date: 10/18/2019 Discharge date: 10/25/2019   Discharge Condition: Stable Code Status: FULL Diet recommendation: Heart Healthy  Brief/Interim Summary: 69 y.o.M with CKD IIIa, GERD who presented with 1 week progressive SOB and hypoxia to 90% at home.In the ER, CXR showed bilateral infiltrates and patient was hypoxic. He was found to be positive for COVID-19.  [facility] were consulted to admit the patient for further evaluation and care.  Patient completed  IV remdesevir x 5 days and on steroids.Hereceived IV lasix for a few days. This has now been stopped.  Was managed with current Covid protocol.  He has not significantly improved.  Today he was able to, with oxygen on room air saturating 90% on ambulation on room air 86% placed on 2 L nasal cannula and improved to 94%. He denies any shortness of breath chest pain nausea vomiting fever chills.  He is tolerating diet well. He is anxious and wants to go home today. Reports his wife and other family numbers were tested positive but they are doing fine. Plan of care was discussed with the patient's wife okay for discharge home today. Home health PT and home o2 will be set up on discharge.   Assessment & Plan: Hypoxic respiratory failure due to COVID-19 pneumonia /pneumonia due to COVID-19 virus: Completed remdesivir on 4/25.he was on high flow nasal cannula and has been weaned down to 2 L and today came off the oxygen and doing well on room air but needing oxygen while on ambulation.  He feels fine is persistent on going home today.  We will discharge him on oral Decadron to complete the course, and also home oxygen and home health PT.  He needs to continue with the 21 days of quarantine since disease  onset        History of Present Illness  12/07/2019  Pulmonary/ 1st office eval/Thresea Doble stopped 02 because sats ok at rest  Due to get covid 2nd shot June 21  Chief Complaint  Patient presents with  . Consult    post covid SOB, covid in 05/21  Dyspnea:  mb is 640 ft minimal incline  Cough: none Sleep: on 2 pillows able to sleep thru the night - baseline 1 pillow SABA use: p walked thru house am of ov (never re challenges or prechallenges. rec Continue prevacid 30 mg Take 30- 60 min before your first and last meals of the day  GERD diet      01/31/2020  f/u ov/Deepak Bless re: post covid sob  Chief Complaint  Patient presents with  . Follow-up    reports feeling at baseline status. last use of albuterol yesterday am. states he is having GI procedure on 02/15/20./  Dyspnea:  Now walking to mb which 640 ft mostly flat s stopping / sob bending over Cough: much better  Sleeping: one pillow bed flat / no resp symptom SABA use: p gets in recliner in am uses it, never pre challenges  02: none    No obvious day to day or daytime variability or assoc excess/ purulent sputum or mucus plugs or hemoptysis or cp or chest tightness, subjective wheeze or overt sinus or hb symptoms.   Sleeping  without nocturnal  or early am exacerbation  of respiratory  c/o's  or need for noct saba. Also denies any obvious fluctuation of symptoms with weather or environmental changes or other aggravating or alleviating factors except as outlined above   No unusual exposure hx or h/o childhood pna/ asthma or knowledge of premature birth.  Current Allergies, Complete Past Medical History, Past Surgical History, Family History, and Social History were reviewed in [employer] record.  ROS  The following are not active complaints unless bolded Hoarseness, sore throat, dysphagia, dental problems, itching, sneezing,  nasal congestion or discharge of excess mucus or purulent secretions, ear ache,    fever, chills, sweats, unintended wt loss or wt gain, classically pleuritic or exertional cp,  orthopnea pnd or arm/hand swelling  or leg swelling, presyncope, palpitations, abdominal pain, anorexia, nausea, vomiting, diarrhea  or change in bowel habits or change in bladder habits, change in stools or change in urine, dysuria, hematuria,  rash, arthralgias, visual complaints, headache, numbness, weakness or ataxia or problems with walking or coordination,  change in mood or  memory.        Current Meds  Medication Sig  . albuterol (VENTOLIN HFA) 108 (90 Base) MCG/ACT inhaler Inhale 2 puffs into the lungs every 6 (six) hours as needed for wheezing or shortness of breath.  Marland Kitchen atorvastatin (LIPITOR) 40 MG tablet Take 1 tablet (40 mg total) by mouth daily.  Marland Kitchen loratadine (CLARITIN) 10 MG tablet Take 10 mg by mouth daily.  . pantoprazole (PROTONIX) 40 MG tablet Take 30- 60 min before your first and last meals of the day  . Psyllium (METAMUCIL FREE & NATURAL) 43 % POWD Take 2 Units by mouth daily. Metamucil 2 teaspoons  . vitamin B-12 (CYANOCOBALAMIN) 1000 MCG tablet Take 1,000 mcg by mouth daily.              Past Medical History:  Diagnosis Date  . Arthritis 07-17-11   hands, hips, knees  . Basal cell carcinoma    nose  . Chronic kidney disease 07-17-11   past kidney stone x1  . Colon polyps    ADENOMATOUS AND HYPERPLASTIC POLYPS  . Diverticulosis   . Gastric ulcer   . GERD (gastroesophageal reflux disease)   . Hemorrhoids   . Hyperlipidemia   . Wears glasses        Objective:     Wt Readings from Last 3 Encounters:  01/31/20 215 lb (97.5 kg)  12/20/19 211 lb 9.6 oz (96 kg)  12/07/19 208 lb (94.3 kg)     Vital signs reviewed - Note on arrival 01/31/2020  02 sats  98% on RA     Pleasant amb wm nad    HEENT : pt wearing mask not removed for exam due to covid -19 concerns.    NECK :  without JVD/Nodes/TM/ nl carotid upstrokes bilaterally   LUNGS: no acc muscle use,  Nl  contour chest which is clear to A and P bilaterally without cough on insp or exp maneuvers   CV:  RRR  no s3 or murmur or increase in P2, and no edema   ABD:  soft and nontender with nl inspiratory excursion in the supine position. No bruits or organomegaly appreciated, bowel sounds nl  MS:  Nl gait/ ext warm without deformities, calf tenderness, cyanosis or clubbing No obvious joint restrictions   SKIN: warm and dry without lesions    NEURO:  alert, approp, nl sensorium with  no motor or cerebellar deficits apparent.       CXR PA and Lateral:  01/31/2020 :    I personally reviewed images and agree with radiology impression as follows:   Findings likely represent atypical infection, possibly viral in etiology. Clinical correlation recommended. No focal consolidation. My impression: improved since May studies                   Assessment

## 2020-01-31 NOTE — Patient Instructions (Addendum)
Try albuterol 15 min before an activity that you know would make you short of breath and see if it makes any difference and if makes none then don't take it after activity unless you can't catch your breath.  GERD (REFLUX)  is an extremely common cause of respiratory symptoms just like yours , many times with no obvious heartburn at all.    It can be treated with medication, but also with lifestyle changes including elevation of the head of your bed (ideally with 6 -8inch blocks under the headboard of your bed),  Smoking cessation, avoidance of late meals, excessive alcohol, and avoid fatty foods, chocolate, peppermint, colas, red wine, and acidic juices such as orange juice.  NO MINT OR MENTHOL PRODUCTS SO NO COUGH DROPS  USE SUGARLESS CANDY INSTEAD (Jolley ranchers or Stover's or Life Savers) or even ice chips will also do - the key is to swallow to prevent all throat clearing. NO OIL BASED VITAMINS - use powdered substitutes.  Avoid fish oil when coughing.   Please remember to go to the  x-ray department  for your tests - we will call you with the results when they are available     Please schedule a follow up office visit in 6-8  Weeks with PFTs , call sooner if needed

## 2020-02-01 ENCOUNTER — Encounter: Payer: Self-pay | Admitting: Internal Medicine

## 2020-02-01 NOTE — Assessment & Plan Note (Signed)
Onset with covid 19 pna  - see adimit 10/18/19  -  12/07/2019   Walked RA  3 laps @ approx 253ft each @ slow slt unsteady  pace  stopped due to sob / weakness but sats never less than 92%  - 01/31/2020   Walked on RA x one lap =  approx 250 ft -@ moderate pace stopped due to tired  With sats 99%    Despite cxr findings I see no evidence of a clinically significant pulmonry limitation here and suspect this is largely a conditioning issue  Rec: Reconditioning with paced regular ex Also advised: I spent extra time with pt today reviewing appropriate use of albuterol for prn use on exertion with the following points: 1) saba is for relief of sob that does not improve by walking a slower pace or resting but rather if the pt does not improve after trying this first. 2) If the pt is convinced, as many are, that saba helps recover from activity faster then it's easy to tell if this is the case by re-challenging : ie stop, take the inhaler, then p 5 minutes try the exact same activity (intensity of workload) that just caused the symptoms and see if they are substantially diminished or not after saba 3) if there is an activity that reproducibly causes the symptoms, try the saba 15 min before the activity on alternate days   If in fact the saba really does help, then fine to continue to use it prn but advised may need to look closer at the maintenance regimen being used to achieve better control of airways disease with exertion.   F/u with pfts in 6-8 weeks and repeat walking study then.          Each maintenance medication was reviewed in detail including emphasizing most importantly the difference between maintenance and prns and under what circumstances the prns are to be triggered using an action plan format where appropriate.  Total time for H and P, chart review, counseling, teaching re approp use of saba device  directly observing portions of ambulatory 02 saturation study/ and generating customized  AVS unique to this office visit / charting = 20 min

## 2020-02-01 NOTE — Assessment & Plan Note (Signed)
Admit 10/18/19 rx remdesivir and dex / d/c on 02 - no desats walking at f/u ov 12/07/2019  Or  01/31/2020  - PFT's rec 02/01/2020 >>>   Believe there is significant cxr lag here but will f/u with another study in 6-8 weeks when returns for pfts anyway

## 2020-02-07 ENCOUNTER — Encounter (HOSPITAL_BASED_OUTPATIENT_CLINIC_OR_DEPARTMENT_OTHER): Payer: Self-pay | Admitting: Surgery

## 2020-02-07 ENCOUNTER — Other Ambulatory Visit: Payer: Self-pay

## 2020-02-07 NOTE — Progress Notes (Signed)
Patient's chart and office notes from pulmonary reviewed with Dr Marcie Bal and states patient will be better served being done at [place]. Left message with Cipriano Mile at Dr Cornett's office.

## 2020-02-08 ENCOUNTER — Ambulatory Visit (INDEPENDENT_AMBULATORY_CARE_PROVIDER_SITE_OTHER): Payer: Medicare Other | Admitting: Family Medicine

## 2020-02-08 ENCOUNTER — Encounter: Payer: Self-pay | Admitting: Family Medicine

## 2020-02-08 VITALS — BP 110/70 | HR 80 | Temp 97.9°F | Resp 18 | Ht 70.0 in | Wt 215.8 lb

## 2020-02-08 DIAGNOSIS — Z8616 Personal history of COVID-19: Secondary | ICD-10-CM

## 2020-02-08 DIAGNOSIS — U071 COVID-19: Secondary | ICD-10-CM

## 2020-02-08 DIAGNOSIS — Z23 Encounter for immunization: Secondary | ICD-10-CM | POA: Diagnosis not present

## 2020-02-08 DIAGNOSIS — J1282 Pneumonia due to coronavirus disease 2019: Secondary | ICD-10-CM | POA: Diagnosis not present

## 2020-02-08 MED ORDER — SHINGRIX 50 MCG/0.5ML IM SUSR: 0.5000 mL | Freq: Once | INTRAMUSCULAR | 1 refills | Status: AC

## 2020-02-08 NOTE — Assessment & Plan Note (Signed)
Improving slowly Encouraged pt to keep up with exercise  F/u pulmonary

## 2020-02-08 NOTE — Patient Instructions (Signed)

## 2020-02-08 NOTE — Progress Notes (Signed)
Patient ID: Anthony King, male    DOB: Nov 28, 1950  Age: 69 y.o. MRN: [id number]    Subjective:  Subjective  HPI Anthony King presents for f/u pulm and covid pneumonia.  Pt is still easily fatigued and sob with exertion He is still getting pt --- he is just frustrated  The albuterol helps   Review of Systems  Constitutional: Positive for fatigue. Negative for appetite change, diaphoresis and unexpected weight change.  Eyes: Negative for pain, redness and visual disturbance.  Respiratory: Positive for chest tightness and shortness of breath. Negative for cough and wheezing.   Cardiovascular: Negative for chest pain, palpitations and leg swelling.  Endocrine: Negative for cold intolerance, heat intolerance, polydipsia, polyphagia and polyuria.  Genitourinary: Negative for difficulty urinating, dysuria and frequency.  Neurological: Negative for dizziness, light-headedness, numbness and headaches.    History Past Medical History:  Diagnosis Date  . Aortic atherosclerosis ([place]) 03/19/2018  . Arthritis 07-17-11   hands, hips, knees  . Basal cell carcinoma    nose  . CAD (coronary artery disease), native coronary artery 03/19/2018  . Chronic kidney disease 07-17-11   past kidney stone x1  . CKD (chronic kidney disease), stage III 03/18/2018  . Colon polyps    ADENOMATOUS AND HYPERPLASTIC POLYPS  . Diverticulosis   . DOE (dyspnea on exertion) 12/07/2019   Onset with covid 19 pna  - see adimit 10/18/19  -  12/07/2019   Walked RA  3 laps @ approx 264ft each @ slow slt unsteady  pace  stopped due to sob / weakness but sats never less than 92%   . Dyspnea    with exertion  . Gastric ulcer   . GERD (gastroesophageal reflux disease)   . Hemorrhoids   . Hyperlipidemia   . Hypoxia 10/18/2019  . Left lower quadrant abdominal pain 05/31/2018  . Prediabetes 03/19/2018  . Unstable angina ([place]) 03/19/2018  . Wears glasses     He has a past surgical history that includes Appendectomy;  fatty tumors (07-17-11); Inguinal hernia repair (07/23/2011); Colonoscopy; Excision basal cell carcinoma; Mass excision (N/A, 08/29/2013); LEFT HEART CATH AND CORONARY ANGIOGRAPHY (N/A, 03/21/2018); Hemorrhoid banding; Inguinal hernia repair (Bilateral, 04/19/2019); and Polypectomy.   His family history includes Alzheimer's disease in his mother; Arthritis in his maternal grandmother, mother, and paternal grandmother; Breast cancer in his mother; Colon cancer in his brother and father; Pneumonia in his mother; Stomach cancer in his father and sister.He reports that he quit smoking about 28 years ago. His smoking use included cigarettes. He has never used smokeless tobacco. He reports that he does not drink alcohol and does not use drugs.  Current Outpatient Medications on File Prior to Visit  Medication Sig Dispense Refill  . albuterol (VENTOLIN HFA) 108 (90 Base) MCG/ACT inhaler Inhale 2 puffs into the lungs every 6 (six) hours as needed for wheezing or shortness of breath. 18 g 1  . atorvastatin (LIPITOR) 40 MG tablet Take 1 tablet (40 mg total) by mouth daily. 90 tablet 3  . loratadine (CLARITIN) 10 MG tablet Take 10 mg by mouth daily.    . pantoprazole (PROTONIX) 40 MG tablet Take 30- 60 min before your first and last meals of the day (Patient taking differently: Take 40 mg by mouth 2 (two) times daily. Take 30- 60 min before your first and last meals of the day) 60 tablet 2  . Psyllium (METAMUCIL FREE & NATURAL) 43 % POWD Take 2 mLs by mouth daily. Metamucil with coffee    .  vitamin B-12 (CYANOCOBALAMIN) 1000 MCG tablet Take 1,000 mcg by mouth daily.      No current facility-administered medications on file prior to visit.     Objective:  Objective  Physical Exam Vitals and nursing note reviewed.  Constitutional:      General: He is sleeping.     Appearance: He is well-developed.  HENT:     Head: Normocephalic and atraumatic.  Eyes:     Pupils: Pupils are equal, round, and reactive to  light.  Neck:     Thyroid: No thyromegaly.  Cardiovascular:     Rate and Rhythm: Normal rate and regular rhythm.     Heart sounds: No murmur heard.   Pulmonary:     Effort: Pulmonary effort is normal. No respiratory distress.     Breath sounds: Decreased air movement present. No wheezing or rales.  Chest:     Chest wall: No tenderness.  Musculoskeletal:        General: No tenderness.     Cervical King: Normal range of motion and neck supple.  Skin:    General: Skin is warm and dry.  Neurological:     Mental Status: He is oriented to person, place, and time.  Psychiatric:        Behavior: Behavior normal.        Thought Content: Thought content normal.        Judgment: Judgment normal.    BP 110/70 (BP Location: Left Arm, Patient Position: Sitting, Cuff Size: Normal)   Pulse 80   Temp 97.9 F (36.6 C) (Oral)   Resp 18   Ht 5\' 10"  (1.778 m)   Wt 215 lb 12.8 oz (97.9 kg)   SpO2 98%   BMI 30.96 kg/m  Wt Readings from Last 3 Encounters:  02/08/20 215 lb 12.8 oz (97.9 kg)  01/31/20 215 lb (97.5 kg)  12/20/19 211 lb 9.6 oz (96 kg)     Lab Results  Component Value Date   WBC 7.3 12/07/2019   HGB 13.7 12/07/2019   HCT 41.9 12/07/2019   PLT 177.0 12/07/2019   GLUCOSE 95 12/07/2019   CHOL 232 (H) 12/20/2019   TRIG 104 12/20/2019   HDL 40 12/20/2019   LDLDIRECT 141.0 06/11/2016   LDLCALC 173 (H) 12/20/2019   ALT 50 (H) 10/23/2019   AST 30 10/23/2019   NA 138 12/07/2019   K 4.3 12/07/2019   CL 106 12/07/2019   CREATININE 1.32 12/07/2019   BUN 14 12/07/2019   CO2 28 12/07/2019   PSA 0.68 06/11/2016   INR 1.01 03/18/2018   HGBA1C 5.7 (H) 03/18/2018    DG Chest 2 View  Result Date: 11/23/2019 CLINICAL DATA:  History of COVID pneumonia. EXAM: CHEST - 2 VIEW COMPARISON:  11/07/2019. FINDINGS: Mediastinum hilar structures are unremarkable. Heart size normal. Diffuse bilateral interstitial prominence again noted. Slight interim improvement from prior exam. No pleural  effusion or pneumothorax. Exostosis right scapula again noted. Degenerative change thoracic spine. IMPRESSION: Diffuse bilateral interstitial prominence consistent with pneumonitis again noted. Slight improvement from prior exam. Electronically Signed   By: [place]   On: 11/23/2019 09:37     Assessment & Plan:  Plan  I am having Anthony King. Anthony King "Anthony King" start on Shingrix. I am also having him maintain his vitamin B-12, Metamucil Free & Natural, loratadine, albuterol, pantoprazole, and atorvastatin.  Meds ordered this encounter  Medications  . Zoster Vaccine Adjuvanted [facility]) injection    Sig: Inject 0.5 mLs into the muscle once  for 1 dose. rto 2 months for #2    Dispense:  0.5 mL    Refill:  1    Problem List Items Addressed This Visit    None    Visit Diagnoses    History of COVID-19    -  Primary   Need for shingles vaccine       Relevant Medications   Zoster Vaccine Adjuvanted [facility]) injection      Follow-up: Return in about 6 months (around 08/10/2020), or if symptoms worsen or fail to improve.  Ann Held, DO

## 2020-02-12 ENCOUNTER — Other Ambulatory Visit (HOSPITAL_COMMUNITY)
Admission: RE | Admit: 2020-02-12 | Discharge: 2020-02-12 | Disposition: A | Discharge status: Home / Self Care | Payer: Medicare Other | Source: Ambulatory Visit | Attending: Surgery | Admitting: Surgery

## 2020-02-12 DIAGNOSIS — Z20822 Contact with and (suspected) exposure to covid-19: Secondary | ICD-10-CM | POA: Insufficient documentation

## 2020-02-12 DIAGNOSIS — Z01812 Encounter for preprocedural laboratory examination: Secondary | ICD-10-CM | POA: Diagnosis not present

## 2020-02-12 LAB — SARS CORONAVIRUS 2 (TAT 6-24 HRS): SARS Coronavirus 2: NEGATIVE

## 2020-02-14 ENCOUNTER — Encounter (HOSPITAL_COMMUNITY): Payer: Self-pay | Admitting: Surgery

## 2020-02-14 ENCOUNTER — Other Ambulatory Visit: Payer: Self-pay

## 2020-02-14 NOTE — Anesthesia Preprocedure Evaluation (Addendum)
Anesthesia Evaluation  Patient identified by MRN, date of birth, ID band Patient awake    Reviewed: Allergy & Precautions, NPO status , Patient's Chart, lab work & pertinent test results  Airway Mallampati: I       Dental no notable dental hx. (+) Teeth Intact   Pulmonary former smoker,    Pulmonary exam normal breath sounds clear to auscultation       Cardiovascular Normal cardiovascular exam Rhythm:Regular Rate:Normal     Neuro/Psych negative neurological ROS  negative psych ROS   GI/Hepatic Neg liver ROS, GERD  Medicated,  Endo/Other  negative endocrine ROS  Renal/GU   negative genitourinary   Musculoskeletal   Abdominal Normal abdominal exam  (+)   Peds  Hematology   Anesthesia Other Findings  Ost 2nd Diag to 2nd Diag lesion is 40% stenosed.  LV end diastolic pressure is normal. LVEDP 9 mm Hg.  There is no aortic valve stenosis.  Nonobstructive CAD.   Continue aggressive preventive therapy.   Consider other causes of chest pain.    1. Left ventricular ejection fraction, by estimation, is 60 to 65%. The  left ventricle has normal function. The left ventricle has no regional  wall motion abnormalities. Left ventricular diastolic parameters are  consistent with Grade II diastolic  dysfunction (pseudonormalization).  2. Right ventricular systolic function is normal. The right ventricular  size is normal.  3. The mitral valve is normal in structure. Trivial mitral valve  regurgitation. No evidence of mitral stenosis.  4. The aortic valve is tricuspid. Aortic valve regurgitation is mild to  moderate. No aortic stenosis is present.  5. Aortic dilatation noted. There is mild dilatation of the ascending  aorta measuring 37 mm.  6. The inferior vena cava is normal in size with greater than 50%  respiratory variability, suggesting right atrial pressure of 3 mmHg.   FINDINGS  Left Ventricle: Left  ventricular ejection fraction, by estimation, is 60  to 65%. The left ventricle has normal function. The left ventricle has no  regional wall motion abnormalities. The left ventricular internal cavity  size was normal in size. There is  no left ventricular hypertrophy. Left ventricular diastolic parameters  are consistent with Grade II diastolic dysfunction (pseudonormalization).  Indeterminate filling pressures.   Right Ventricle: The right ventricular size is normal. No increase in  right ventricular wall thickness. Right ventricular systolic function is  normal.   Left Atrium: Left atrial size was normal in size.   Right  Reproductive/Obstetrics                          Anesthesia Physical Anesthesia Plan  ASA: III  Anesthesia Plan: General   Post-op Pain Management:  Regional for Post-op pain   Induction: Intravenous  PONV Risk Score and Plan: 4 or greater and Ondansetron, Dexamethasone and Midazolam  Airway Management Planned: Oral ETT  Additional Equipment: None  Intra-op Plan:   Post-operative Plan:   Informed Consent: I have reviewed the patients History and Physical, chart, labs and discussed the procedure including the risks, benefits and alternatives for the proposed anesthesia with the patient or authorized representative who has indicated his/her understanding and acceptance.     Dental advisory given  Plan Discussed with: CRNA  Anesthesia Plan Comments: (PAT note by Karoline Caldwell, PA-C: Follows with cardiology for history of chest pain s/p cardiac cath 2019 nonobstructive CAD with 40% second diagonal, hyperlipidemia.  Last seen 12/20/2019 and discussed that he was  still struggling with DOE since having COVID-19 pneumonia.  Echo was ordered which showed EF 60 to 65%, grade 2 DD, mild-mod AR.  He was recommended to follow-up with cardiology yearly.  He is being followed by Dr. Melvyn Novas for persistent DOE s/p COVID-19 pneumonia.  He was  admitted April 2021. CompletedIV remdesevir x 5 days and onsteroids.  Last seen by Dr. Melvyn Novas 02/01/2020, Walked on RA x one lap =  approx 250 ft -@ moderate pace stopped due to tired  With sats 99%.  His persistent DOE was felt to be due to deconditioning.  Per note, "Despite cxr findings I see no evidence of a clinically significant pulmonry limitation here and suspect this is largely a conditioning issue.Marland KitchenMarland KitchenBelieve there is significant cxr lag here but will f/u with another study in 6-8 weeks when returns for pfts anyway."  Will need day of surgery labs and eval.  EKG 10/18/2019:Sinus rhythm.  Rate 85. Incomplete right bundle branch block  CHEST - 2 VIEW 01/31/2020:  COMPARISON:  Chest radiograph dated 11/23/2019.  FINDINGS: Diffuse interstitial and peribronchial densities may represent reactive airway disease or viral/atypical infection. Clinical correlation is recommended. No focal consolidation, pleural effusion, or pneumothorax. The cardiac silhouette is within limits. No acute osseous pathology.  IMPRESSION: Findings likely represent atypical infection, possibly viral in etiology. Clinical correlation recommended. No focal consolidation.  TTE 01/12/2020: 1. Left ventricular ejection fraction, by estimation, is 60 to 65%. The  left ventricle has normal function. The left ventricle has no regional  wall motion abnormalities. Left ventricular diastolic parameters are  consistent with Grade II diastolic  dysfunction (pseudonormalization).  2. Right ventricular systolic function is normal. The right ventricular  size is normal.  3. The mitral valve is normal in structure. Trivial mitral valve  regurgitation. No evidence of mitral stenosis.  4. The aortic valve is tricuspid. Aortic valve regurgitation is mild to  moderate. No aortic stenosis is present.  5. Aortic dilatation noted. There is mild dilatation of the ascending  aorta measuring 37 mm.  6. The inferior vena cava is  normal in size with greater than 50%  respiratory variability, suggesting right atrial pressure of 3 mmHg.   LHC 03/21/2018: Ost 2nd Diag to 2nd Diag lesion is 40% stenosed. LV end diastolic pressure is normal. LVEDP 9 mm Hg. There is no aortic valve stenosis. Nonobstructive CAD.   Continue aggressive preventive therapy.   Consider other causes of chest pain.   )       Anesthesia Quick Evaluation

## 2020-02-14 NOTE — Progress Notes (Addendum)
Pre-op Phone Call:  PCP - Ann Held, DO Cardiologist - Lauree Chandler, MD Pulmonologist- Christena Deem. Melvyn Novas, MD  PPM/ICD - Denies   Chest x-ray - 01/31/20 EKG - 10/18/19 Stress Test - 08/12/16 ECHO - 01/12/20 Cardiac Cath - 03/21/18  Sleep Study - Denies  Patient is pre-diabetic, does not check CBG.  Blood Thinner Instructions: N/A Aspirin Instructions: N/A  ERAS Protcol - Yes PRE-SURGERY Ensure or G2- Not ordered  COVID TEST- negative, 02/12/20   Anesthesia review: Yes, cardiac hx.  ----------------------------------------------------  Patient instructions:   Your procedure is scheduled on Thursday, August 19..  Report to [facility] Main Entrance "A" at 05:30 A.M., and check in at the Admitting office.  Call this number if you have problems the morning of surgery:  [phone]    Remember:  Do not eat after midnight the night before your surgery.  You may drink clear liquids until 04:30 the morning of your surgery.   Clear liquids allowed are: Water, Non-Citrus Juices (without pulp), Carbonated Beverages, Clear Tea, Black Coffee Only, and Gatorade.    Take these medicines the morning of surgery with A SIP OF WATER: atorvastatin (LIPITOR) loratadine (CLARITIN) pantoprazole (PROTONIX) albuterol (VENTOLIN HFA) inhaler- if needed  As of today, STOP taking any Aspirin (unless otherwise instructed by your surgeon) Aleve, Naproxen, Ibuprofen, Motrin, Advil, Goody's, BC's, all herbal medications, fish oil, and all vitamins.                      Do not wear jewelry.            Do not wear lotions, powders, perfumes/colognes, or deodorant.            Do not bring valuables to the hospital.            [facility] is not responsible for any belongings or valuables.  Do NOT Smoke (Tobacco/Vaping) or drink Alcohol 24 hours prior to your procedure.   Day of Surgery: Shower Wear Clean/Comfortable clothing the morning of surgery Do not apply any  deodorants/lotions.   Remember to brush your teeth WITH YOUR REGULAR TOOTHPASTE.             Do not wear jewelry, make up, or nail polish             Do not bring valuables to the hospital.

## 2020-02-14 NOTE — Progress Notes (Signed)
Several unsuccessful attempts made to call patient to conduct pre-op phone call. Left VM on patient's home phone, and emergency contact, Dusty's cell phone. Called pt's wife's cell phone, but it is possibly incorrect in system as the person answering stated it was the wrong number.

## 2020-02-14 NOTE — Progress Notes (Signed)
Anesthesia Chart Review: Same day workup  Follows with cardiology for history of chest pain s/p cardiac cath 2019 nonobstructive CAD with 40% second diagonal, hyperlipidemia.  Last seen 12/20/2019 and discussed that he was still struggling with DOE since having COVID-19 pneumonia.  Echo was ordered which showed EF 60 to 65%, grade 2 DD, mild-mod AR.  He was recommended to follow-up with cardiology yearly.  He is being followed by Dr. Melvyn Novas for persistent DOE s/p COVID-19 pneumonia.  He was admitted April 2021. CompletedIV remdesevir x 5 days and onsteroids.  Last seen by Dr. Melvyn Novas 02/01/2020, Walked on RA x one lap =  approx 250 ft -@ moderate pace stopped due to tired  With sats 99%.  His persistent DOE was felt to be due to deconditioning.  Per note, "Despite cxr findings I see no evidence of a clinically significant pulmonry limitation here and suspect this is largely a conditioning issue.Marland KitchenMarland KitchenBelieve there is significant cxr lag here but will f/u with another study in 6-8 weeks when returns for pfts anyway."  Will need day of surgery labs and eval.  EKG 10/18/2019:Sinus rhythm.  Rate 85. Incomplete right bundle branch block  CHEST - 2 VIEW 01/31/2020:  COMPARISON:  Chest radiograph dated 11/23/2019.  FINDINGS: Diffuse interstitial and peribronchial densities may represent reactive airway disease or viral/atypical infection. Clinical correlation is recommended. No focal consolidation, pleural effusion, or pneumothorax. The cardiac silhouette is within limits. No acute osseous pathology.  IMPRESSION: Findings likely represent atypical infection, possibly viral in etiology. Clinical correlation recommended. No focal consolidation.  TTE 01/12/2020: 1. Left ventricular ejection fraction, by estimation, is 60 to 65%. The  left ventricle has normal function. The left ventricle has no regional  wall motion abnormalities. Left ventricular diastolic parameters are  consistent with Grade II  diastolic  dysfunction (pseudonormalization).  2. Right ventricular systolic function is normal. The right ventricular  size is normal.  3. The mitral valve is normal in structure. Trivial mitral valve  regurgitation. No evidence of mitral stenosis.  4. The aortic valve is tricuspid. Aortic valve regurgitation is mild to  moderate. No aortic stenosis is present.  5. Aortic dilatation noted. There is mild dilatation of the ascending  aorta measuring 37 mm.  6. The inferior vena cava is normal in size with greater than 50%  respiratory variability, suggesting right atrial pressure of 3 mmHg.   LHC 03/21/2018:  Ost 2nd Diag to 2nd Diag lesion is 40% stenosed.  LV end diastolic pressure is normal. LVEDP 9 mm Hg.  There is no aortic valve stenosis.  Nonobstructive CAD.   Continue aggressive preventive therapy.   Consider other causes of chest pain.     Wynonia Musty [facility]/Anesthesiology Phone [phone] 02/14/2020 4:07 PM

## 2020-02-15 ENCOUNTER — Ambulatory Visit (HOSPITAL_COMMUNITY)
Admission: RE | Admit: 2020-02-15 | Discharge: 2020-02-15 | Disposition: A | Discharge status: Home / Self Care | Payer: Medicare Other | Attending: Surgery | Admitting: Surgery

## 2020-02-15 ENCOUNTER — Ambulatory Visit (HOSPITAL_COMMUNITY): Payer: Medicare Other | Admitting: Physician Assistant

## 2020-02-15 ENCOUNTER — Encounter (HOSPITAL_COMMUNITY)
Admission: RE | Disposition: A | Discharge status: Home / Self Care | Payer: Self-pay | Source: Home / Self Care | Attending: Surgery

## 2020-02-15 ENCOUNTER — Encounter (HOSPITAL_COMMUNITY): Payer: Self-pay | Admitting: Surgery

## 2020-02-15 DIAGNOSIS — Z79899 Other long term (current) drug therapy: Secondary | ICD-10-CM | POA: Insufficient documentation

## 2020-02-15 DIAGNOSIS — G5781 Other specified mononeuropathies of right lower limb: Secondary | ICD-10-CM | POA: Insufficient documentation

## 2020-02-15 DIAGNOSIS — K432 Incisional hernia without obstruction or gangrene: Secondary | ICD-10-CM | POA: Diagnosis not present

## 2020-02-15 DIAGNOSIS — Z87891 Personal history of nicotine dependence: Secondary | ICD-10-CM | POA: Diagnosis not present

## 2020-02-15 DIAGNOSIS — K219 Gastro-esophageal reflux disease without esophagitis: Secondary | ICD-10-CM | POA: Diagnosis not present

## 2020-02-15 DIAGNOSIS — K4091 Unilateral inguinal hernia, without obstruction or gangrene, recurrent: Secondary | ICD-10-CM | POA: Insufficient documentation

## 2020-02-15 DIAGNOSIS — Z8616 Personal history of COVID-19: Secondary | ICD-10-CM | POA: Insufficient documentation

## 2020-02-15 DIAGNOSIS — N5089 Other specified disorders of the male genital organs: Secondary | ICD-10-CM | POA: Insufficient documentation

## 2020-02-15 DIAGNOSIS — I2510 Atherosclerotic heart disease of native coronary artery without angina pectoris: Secondary | ICD-10-CM | POA: Diagnosis not present

## 2020-02-15 DIAGNOSIS — G8918 Other acute postprocedural pain: Secondary | ICD-10-CM | POA: Diagnosis not present

## 2020-02-15 DIAGNOSIS — T83718A Erosion of other implanted mesh and other prosthetic materials to surrounding organ or tissue, initial encounter: Secondary | ICD-10-CM | POA: Diagnosis not present

## 2020-02-15 DIAGNOSIS — K660 Peritoneal adhesions (postprocedural) (postinfection): Secondary | ICD-10-CM | POA: Diagnosis not present

## 2020-02-15 HISTORY — DX: Prediabetes: R73.03

## 2020-02-15 HISTORY — PX: LAPAROSCOPY: SHX197

## 2020-02-15 HISTORY — DX: Personal history of urinary calculi: Z87.442

## 2020-02-15 HISTORY — PX: EXCISION OF MESH: SHX6268

## 2020-02-15 HISTORY — PX: GROIN DISSECTION: SHX5250

## 2020-02-15 HISTORY — PX: INGUINAL HERNIA REPAIR: SHX194

## 2020-02-15 HISTORY — DX: Pneumonia, unspecified organism: J18.9

## 2020-02-15 HISTORY — DX: Dyspnea, unspecified: R06.00

## 2020-02-15 LAB — COMPREHENSIVE METABOLIC PANEL
ALT: 25 U/L (ref 0–44)
AST: 26 U/L (ref 15–41)
Albumin: 4 g/dL (ref 3.5–5.0)
Alkaline Phosphatase: 90 U/L (ref 38–126)
Anion gap: 8 (ref 5–15)
BUN: 13 mg/dL (ref 8–23)
CO2: 26 mmol/L (ref 22–32)
Calcium: 9.4 mg/dL (ref 8.9–10.3)
Chloride: 105 mmol/L (ref 98–111)
Creatinine, Ser: 1.36 mg/dL — ABNORMAL HIGH (ref 0.61–1.24)
GFR calc Af Amer: >60 mL/min (ref >60)
GFR calc non Af Amer: 53 mL/min — ABNORMAL LOW (ref >60)
Glucose, Bld: 111 mg/dL — ABNORMAL HIGH (ref 70–99)
Potassium: 4.1 mmol/L (ref 3.5–5.1)
Sodium: 139 mmol/L (ref 135–145)
Total Bilirubin: 1.4 mg/dL — ABNORMAL HIGH (ref 0.3–1.2)
Total Protein: 6.8 g/dL (ref 6.5–8.1)

## 2020-02-15 LAB — CBC WITH DIFFERENTIAL/PLATELET
Abs Immature Granulocytes: 0.02 10*3/uL (ref 0.00–0.07)
Basophils Absolute: 0.1 10*3/uL (ref 0.0–0.1)
Basophils Relative: 1 %
Eosinophils Absolute: 0.3 10*3/uL (ref 0.0–0.5)
Eosinophils Relative: 4 %
HCT: 48.6 % (ref 39.0–52.0)
Hemoglobin: 15.2 g/dL (ref 13.0–17.0)
Immature Granulocytes: 0 %
Lymphocytes Relative: 38 %
Lymphs Abs: 2.6 10*3/uL (ref 0.7–4.0)
MCH: 26.4 pg (ref 26.0–34.0)
MCHC: 31.3 g/dL (ref 30.0–36.0)
MCV: 84.4 fL (ref 80.0–100.0)
Monocytes Absolute: 0.7 10*3/uL (ref 0.1–1.0)
Monocytes Relative: 11 %
Neutro Abs: 3.2 10*3/uL (ref 1.7–7.7)
Neutrophils Relative %: 46 %
Platelets: 202 10*3/uL (ref 150–400)
RBC: 5.76 MIL/uL (ref 4.22–5.81)
RDW: 13.2 % (ref 11.5–15.5)
WBC: 6.8 10*3/uL (ref 4.0–10.5)
nRBC: 0 % (ref 0.0–0.2)

## 2020-02-15 SURGERY — LAPAROSCOPY, DIAGNOSTIC
Anesthesia: General | Site: Groin | Laterality: Right

## 2020-02-15 MED ORDER — CHLORHEXIDINE GLUCONATE CLOTH 2 % EX PADS: 6.0000 | MEDICATED_PAD | Freq: Once | CUTANEOUS | Status: DC

## 2020-02-15 MED ORDER — MIDAZOLAM HCL 2 MG/2ML IJ SOLN
INTRAMUSCULAR | Status: DC | PRN
  Administered 2020-02-15: 2 mg via INTRAVENOUS

## 2020-02-15 MED ORDER — ACETAMINOPHEN 10 MG/ML IV SOLN
INTRAVENOUS | Status: AC
  Filled 2020-02-15: qty 100

## 2020-02-15 MED ORDER — FENTANYL CITRATE (PF) 100 MCG/2ML IJ SOLN
INTRAMUSCULAR | Status: AC
  Filled 2020-02-15: qty 2

## 2020-02-15 MED ORDER — CHLORHEXIDINE GLUCONATE 0.12 % MT SOLN
OROMUCOSAL | Status: AC
  Filled 2020-02-15: qty 15

## 2020-02-15 MED ORDER — 0.9 % SODIUM CHLORIDE (POUR BTL) OPTIME
TOPICAL | Status: DC | PRN
  Administered 2020-02-15: 1000 mL

## 2020-02-15 MED ORDER — PROMETHAZINE HCL 25 MG/ML IJ SOLN: 6.2500 mg | INTRAMUSCULAR | Status: DC | PRN

## 2020-02-15 MED ORDER — EPHEDRINE SULFATE-NACL 50-0.9 MG/10ML-% IV SOSY
PREFILLED_SYRINGE | INTRAVENOUS | Status: DC | PRN
  Administered 2020-02-15 (×5): 10 mg via INTRAVENOUS

## 2020-02-15 MED ORDER — ROCURONIUM BROMIDE 10 MG/ML (PF) SYRINGE
PREFILLED_SYRINGE | INTRAVENOUS | Status: AC
  Filled 2020-02-15: qty 10

## 2020-02-15 MED ORDER — OXYCODONE HCL 5 MG PO TABS
ORAL_TABLET | ORAL | Status: AC
  Filled 2020-02-15: qty 1

## 2020-02-15 MED ORDER — PROPOFOL 10 MG/ML IV BOLUS
INTRAVENOUS | Status: DC | PRN
  Administered 2020-02-15: 150 mg via INTRAVENOUS

## 2020-02-15 MED ORDER — SUGAMMADEX SODIUM 200 MG/2ML IV SOLN
INTRAVENOUS | Status: DC | PRN
  Administered 2020-02-15: 200 mg via INTRAVENOUS

## 2020-02-15 MED ORDER — MEPERIDINE HCL 25 MG/ML IJ SOLN: 6.2500 mg | INTRAMUSCULAR | Status: DC | PRN

## 2020-02-15 MED ORDER — BUPIVACAINE-EPINEPHRINE (PF) 0.5% -1:200000 IJ SOLN
INTRAMUSCULAR | Status: DC | PRN
  Administered 2020-02-15 (×12): 5 mL via PERINEURAL

## 2020-02-15 MED ORDER — PROPOFOL 10 MG/ML IV BOLUS
INTRAVENOUS | Status: AC
  Filled 2020-02-15: qty 20

## 2020-02-15 MED ORDER — OXYCODONE HCL 5 MG PO TABS
5.0000 mg | ORAL_TABLET | Freq: Once | ORAL | Status: AC | PRN
  Administered 2020-02-15: 5 mg via ORAL

## 2020-02-15 MED ORDER — ACETAMINOPHEN 160 MG/5ML PO SOLN: 325.0000 mg | ORAL | Status: DC | PRN

## 2020-02-15 MED ORDER — EPHEDRINE 5 MG/ML INJ
INTRAVENOUS | Status: AC
  Filled 2020-02-15: qty 10

## 2020-02-15 MED ORDER — ONDANSETRON HCL 4 MG/2ML IJ SOLN
INTRAMUSCULAR | Status: DC | PRN
  Administered 2020-02-15: 4 mg via INTRAVENOUS

## 2020-02-15 MED ORDER — BUPIVACAINE HCL (PF) 0.25 % IJ SOLN
INTRAMUSCULAR | Status: DC | PRN
  Administered 2020-02-15: 8 mL

## 2020-02-15 MED ORDER — LACTATED RINGERS IV SOLN: INTRAVENOUS | Status: DC | PRN

## 2020-02-15 MED ORDER — ONDANSETRON HCL 4 MG/2ML IJ SOLN
INTRAMUSCULAR | Status: AC
  Filled 2020-02-15: qty 2

## 2020-02-15 MED ORDER — ACETAMINOPHEN 10 MG/ML IV SOLN
INTRAVENOUS | Status: DC | PRN
  Administered 2020-02-15: 1000 mg via INTRAVENOUS

## 2020-02-15 MED ORDER — STERILE WATER FOR IRRIGATION IR SOLN
Status: DC | PRN
  Administered 2020-02-15 (×2): 1000 mL

## 2020-02-15 MED ORDER — DEXAMETHASONE SODIUM PHOSPHATE 10 MG/ML IJ SOLN
INTRAMUSCULAR | Status: AC
  Filled 2020-02-15: qty 1

## 2020-02-15 MED ORDER — FENTANYL CITRATE (PF) 250 MCG/5ML IJ SOLN
INTRAMUSCULAR | Status: AC
  Filled 2020-02-15: qty 5

## 2020-02-15 MED ORDER — BUPIVACAINE HCL (PF) 0.25 % IJ SOLN
INTRAMUSCULAR | Status: AC
  Filled 2020-02-15: qty 30

## 2020-02-15 MED ORDER — ROCURONIUM BROMIDE 10 MG/ML (PF) SYRINGE
PREFILLED_SYRINGE | INTRAVENOUS | Status: DC | PRN
  Administered 2020-02-15: 70 mg via INTRAVENOUS

## 2020-02-15 MED ORDER — FENTANYL CITRATE (PF) 100 MCG/2ML IJ SOLN
25.0000 ug | INTRAMUSCULAR | Status: DC | PRN
  Administered 2020-02-15: 25 ug via INTRAVENOUS

## 2020-02-15 MED ORDER — MIDAZOLAM HCL 2 MG/2ML IJ SOLN
INTRAMUSCULAR | Status: AC
  Filled 2020-02-15: qty 2

## 2020-02-15 MED ORDER — LACTATED RINGERS IV SOLN: INTRAVENOUS | Status: DC

## 2020-02-15 MED ORDER — LIDOCAINE 2% (20 MG/ML) 5 ML SYRINGE
INTRAMUSCULAR | Status: DC | PRN
  Administered 2020-02-15: 70 mg via INTRAVENOUS

## 2020-02-15 MED ORDER — FENTANYL CITRATE (PF) 250 MCG/5ML IJ SOLN
INTRAMUSCULAR | Status: DC | PRN
  Administered 2020-02-15: 100 ug via INTRAVENOUS
  Administered 2020-02-15: 50 ug via INTRAVENOUS

## 2020-02-15 MED ORDER — ACETAMINOPHEN 325 MG PO TABS: 325.0000 mg | ORAL_TABLET | ORAL | Status: DC | PRN

## 2020-02-15 MED ORDER — CEFAZOLIN SODIUM-DEXTROSE 2-4 GM/100ML-% IV SOLN
2.0000 g | INTRAVENOUS | Status: AC
  Administered 2020-02-15: 2 g via INTRAVENOUS
  Filled 2020-02-15: qty 100

## 2020-02-15 MED ORDER — DEXAMETHASONE SODIUM PHOSPHATE 10 MG/ML IJ SOLN
INTRAMUSCULAR | Status: DC | PRN
  Administered 2020-02-15: 10 mg via INTRAVENOUS

## 2020-02-15 MED ORDER — LIDOCAINE 2% (20 MG/ML) 5 ML SYRINGE
INTRAMUSCULAR | Status: AC
  Filled 2020-02-15: qty 5

## 2020-02-15 MED ORDER — OXYCODONE HCL 5 MG/5ML PO SOLN: 5.0000 mg | Freq: Once | ORAL | Status: AC | PRN

## 2020-02-15 MED ORDER — OXYCODONE HCL 5 MG PO TABS: 5.0000 mg | ORAL_TABLET | Freq: Four times a day (QID) | ORAL | 0 refills | Status: DC | PRN

## 2020-02-15 SURGICAL SUPPLY — 58 items
ADH SKN CLS APL DERMABOND .7 (GAUZE/BANDAGES/DRESSINGS) ×2
APL PRP STRL LF DISP 70% ISPRP (MISCELLANEOUS) ×2
BLADE CLIPPER SURG (BLADE) ×1 IMPLANT
BLADE SURG 10 STRL SS (BLADE) ×1 IMPLANT
CANISTER SUCT 3000ML PPV (MISCELLANEOUS) IMPLANT
CHLORAPREP W/TINT 26 (MISCELLANEOUS) ×3 IMPLANT
COVER SURGICAL LIGHT HANDLE (MISCELLANEOUS) ×3 IMPLANT
COVER WAND RF STERILE (DRAPES) ×2 IMPLANT
DECANTER SPIKE VIAL GLASS SM (MISCELLANEOUS) ×3 IMPLANT
DERMABOND ADVANCED (GAUZE/BANDAGES/DRESSINGS) ×1
DERMABOND ADVANCED .7 DNX12 (GAUZE/BANDAGES/DRESSINGS) ×2 IMPLANT
DRAIN PENROSE 1/2X12 LTX STRL (WOUND CARE) ×1 IMPLANT
DRAPE LAPAROTOMY TRNSV 102X78 (DRAPES) ×2 IMPLANT
DRAPE WARM FLUID 44X44 (DRAPES) ×3 IMPLANT
ELECT CAUTERY BLADE 6.4 (BLADE) ×1 IMPLANT
ELECT REM PT RETURN 9FT ADLT (ELECTROSURGICAL) ×3
ELECTRODE REM PT RTRN 9FT ADLT (ELECTROSURGICAL) ×2 IMPLANT
GLOVE BIO SURGEON STRL SZ8 (GLOVE) ×3 IMPLANT
GLOVE BIOGEL PI IND STRL 8 (GLOVE) ×2 IMPLANT
GLOVE BIOGEL PI INDICATOR 8 (GLOVE) ×1
GOWN STRL REUS W/ TWL LRG LVL3 (GOWN DISPOSABLE) ×4 IMPLANT
GOWN STRL REUS W/ TWL XL LVL3 (GOWN DISPOSABLE) ×2 IMPLANT
GOWN STRL REUS W/TWL LRG LVL3 (GOWN DISPOSABLE) ×6
GOWN STRL REUS W/TWL XL LVL3 (GOWN DISPOSABLE) ×3
KIT BASIN OR (CUSTOM PROCEDURE TRAY) ×3 IMPLANT
KIT TURNOVER KIT B (KITS) ×5 IMPLANT
MESH ULTRAPRO 3X6 7.6X15CM (Mesh General) ×1 IMPLANT
NDL HYPO 25GX1X1/2 BEV (NEEDLE) ×2 IMPLANT
NEEDLE HYPO 25GX1X1/2 BEV (NEEDLE) ×3 IMPLANT
NS IRRIG 1000ML POUR BTL (IV SOLUTION) ×3 IMPLANT
PACK GENERAL/GYN (CUSTOM PROCEDURE TRAY) ×3 IMPLANT
PAD ARMBOARD 7.5X6 YLW CONV (MISCELLANEOUS) ×3 IMPLANT
PENCIL SMOKE EVACUATOR (MISCELLANEOUS) ×3 IMPLANT
SCISSORS LAP 5X35 DISP (ENDOMECHANICALS) IMPLANT
SET IRRIG TUBING LAPAROSCOPIC (IRRIGATION / IRRIGATOR) IMPLANT
SET TUBE SMOKE EVAC HIGH FLOW (TUBING) ×3 IMPLANT
SHEARS HARMONIC ACE PLUS 36CM (ENDOMECHANICALS) IMPLANT
SLEEVE ENDOPATH XCEL 5M (ENDOMECHANICALS) ×3 IMPLANT
SUT MNCRL AB 4-0 PS2 18 (SUTURE) ×3 IMPLANT
SUT NOVA NAB DX-16 0-1 5-0 T12 (SUTURE) ×6 IMPLANT
SUT SILK 2 0 SH (SUTURE) IMPLANT
SUT VIC AB 0 CT1 27 (SUTURE)
SUT VIC AB 0 CT1 27XBRD ANBCTR (SUTURE) IMPLANT
SUT VIC AB 2-0 SH 27 (SUTURE) ×3
SUT VIC AB 2-0 SH 27X BRD (SUTURE) ×2 IMPLANT
SUT VIC AB 3-0 SH 18 (SUTURE) ×3 IMPLANT
SUT VICRYL AB 3 0 TIES (SUTURE) ×2 IMPLANT
SYR CONTROL 10ML LL (SYRINGE) ×3 IMPLANT
TOWEL GREEN STERILE (TOWEL DISPOSABLE) ×3 IMPLANT
TOWEL GREEN STERILE FF (TOWEL DISPOSABLE) ×3 IMPLANT
TRAY LAPAROSCOPIC MC (CUSTOM PROCEDURE TRAY) ×2 IMPLANT
TROCAR XCEL BLUNT TIP 100MML (ENDOMECHANICALS) IMPLANT
TROCAR XCEL NON-BLD 11X100MML (ENDOMECHANICALS) IMPLANT
TROCAR XCEL NON-BLD 5MMX100MML (ENDOMECHANICALS) ×3 IMPLANT
TUBE CONNECTING 12X1/4 (SUCTIONS) ×1 IMPLANT
WARMER LAPAROSCOPE (MISCELLANEOUS) ×1 IMPLANT
WATER STERILE IRR 1000ML POUR (IV SOLUTION) ×2 IMPLANT
YANKAUER SUCT BULB TIP NO VENT (SUCTIONS) ×1 IMPLANT

## 2020-02-15 NOTE — Anesthesia Procedure Notes (Signed)
Anesthesia Regional Block: TAP block   Pre-Anesthetic Checklist: ,, timeout performed, Correct Patient, Correct Site, Correct Laterality, Correct Procedure, Correct Position, site marked, Risks and benefits discussed,  Surgical consent,  Pre-op evaluation,  At surgeon's request and post-op pain management  Laterality: Right  Prep: chloraprep       Needles:  Injection technique: Single-shot  Needle Type: Echogenic Stimulator Needle     Needle Length: 9cm  Needle Gauge: 20   Needle insertion depth: 2 cm   Additional Needles:   Procedures:,,,, ultrasound used (permanent image in chart),,,,  Narrative:  Start time: 02/15/2020 7:18 AM End time: 02/15/2020 7:27 AM Injection made incrementally with aspirations every 5 mL.  Performed by: Personally  Anesthesiologist: Lyn Hollingshead, MD

## 2020-02-15 NOTE — Interval H&P Note (Signed)
History and Physical Interval Note:  02/15/2020 7:24 AM  Anthony King  has presented today for surgery, with the diagnosis of GROIN PAIN.  The various methods of treatment have been discussed with the patient and family. After consideration of risks, benefits and other options for treatment, the patient has consented to  Procedure(s) with comments: LAPAROSCOPY DIAGNOSTIC (N/A) Nicholson (Right) - DR Munford as a surgical intervention.  The patient's history has been reviewed, patient examined, no change in status, stable for surgery.  I have reviewed the patient's chart and labs.  Questions were answered to the patient's satisfaction.     Turner Daniels MD

## 2020-02-15 NOTE — Transfer of Care (Signed)
Immediate Anesthesia Transfer of Care Note  Patient: [name]  Procedure(s) Performed: LAPAROSCOPY DIAGNOSTIC (N/A Groin) RIGHT GROIN EXPLORATION WITH MESH REMOVAL (Right Groin) INSERTION OF MESH (Right Groin)  Patient Location: PACU  Anesthesia Type:General  Level of Consciousness: drowsy, patient cooperative and responds to stimulation  Airway & Oxygen Therapy: Patient Spontanous Breathing and Patient connected to face mask oxygen  Post-op Assessment: Report given to RN and Post -op Vital signs reviewed and stable  Post vital signs: Reviewed and stable  Last Vitals:  Vitals Value Taken Time  BP 136/76 02/15/20 1007  Temp    Pulse 83 02/15/20 1008  Resp 17 02/15/20 1008  SpO2 100 % 02/15/20 1008  Vitals shown include unvalidated device data.  Last Pain:  Vitals:   02/15/20 0636  TempSrc: Oral  PainSc: 3       Patients Stated Pain Goal: 3 (72/07/21 8288)  Complications: No complications documented.

## 2020-02-15 NOTE — Op Note (Signed)
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  Normal left inguinal canal for laparoscopy  Procedure: Diagnostic laparoscopy with right groin exploration and explantation of ProGrip mesh with placement of ultra Pro mesh and repair of right inguinal hernia with neurectomy of the ilioinguinal nerve, iliohypogastric nerve and femoral branch of the genitofemoral nerve  Surgeon: Erroll Luna, MD  Anesthesia: General with bilateral regional blocks and 0 25% local  EBL: Minimal  Specimen: None  Indications for procedure the patient is a 69 year old male status post repair of his right inguinal hernia as well as left inguinal hernia about 10 months ago.  He has had chronic right groin and right testicle pain despite a negative work-up including CT scanning and ultrasound.  The pain is not improved with medical management and work-up revealed no obvious etiology.  I discussed the issue of chronic pain with him.  He is a non-smoker but he is now 10 months out and his symptoms are not improved despite maximal medical management.  I explained chronic pain may persist even after mesh removal and exploration.  I recommended neurectomy of the ileal nerve, to the gastric nerve as well as the genital branch of the genitofemoral nerve.  I discussed expectation of pain relief which is unpredictable in this circumstance.  I discussed potential continue chronic pain despite the above measures.  I discussed more nonoperative management but this point time he is maxed out on this and this is his best option at this point.  Success rates are variable with his procedure was Adeleke discussion with him about especially relieving pain he may have chronic discomfort in his groin the remainder of his life.  He understood the above risks  wished to proceed.  Recommend laparoscopy to evaluate for any recurrent hernia prior to exploring either groin.  He has had minimal symptoms on the left therefore I felt this could be observed for now.The risk of hernia repair include bleeding,  Infection,   Recurrence of the hernia,  Mesh use, chronic pain,  Organ injury,  Bowel injury,  Bladder injury,   nerve injury with numbness around the incision,  Death,  and worsening of preexisting  medical problems.  The alternatives to surgery have been discussed as well..  Long term expectations of both operative and non operative treatments have been discussed.   The patient agrees to proceed.   Description of procedure: The patient was seen in the holding area.  He underwent bilateral TAP blocks per anesthesia.  All questions were answered.  He was brought back to the operative room.  He is placed supine upon the OR table.  After induction of general esthesia the abdomen and groin regions were prepped and draped in sterile fashion timeout performed.  Proper patient, site and procedure were verified.  A 5 mm Optiview port was placed the patient's right upper quadrant using local anesthetic.  The port was passed under direct guidance through all layers of the abdominal wall and into the peritoneal cavity without injury.  Pneumoperitoneum was then created to 15 mmHg CO2 pressure.  Laparoscope was placed.  Upon inspection of the abdominal cavity there is no obvious abnormality.  Both anal canals were carefully examined and saw no evidence of recurrent hernia and photographs were taken.  He had a few adhesions to the area otherwise no other explanation for  his pain and no evidence of recurrent hernia or other intra-abdominal abnormality to explain his pain.  At this point allowed the CO2 to escape after doing careful four-quadrant inspection and saw no evidence of other abnormality nor injury from port insertion.  CO2 was allowed to escape the port was removed.  The  right groin was then infiltrated with local anesthetic.  The original scar from his hernia surgery was used and this was reopened.  Dissection was carried down through scar tissue until the aponeurosis of the external oblique was encountered.  This was carefully cleaned off to define its borders as well as the external ring.  Small incision was made in the anterior portion of this and there were dense adhesions which not unexpected.  I was able to carefully open the roof of the inguinal canal through the external ring.  The mesh was well incorporated.  The cord structures were significantly tethered within the opening of the mesh.  This was quite tight and the mesh had shrunk by about 20% look like and caused significant constriction around the cord.  I was able to identify the ilioinguinal nerve which was with the cord.  I carefully began to dissect the mesh away from the floor the inguinal canal with cautery and scissors.  I then was able to encircled the cord structures the quarter-inch Penrose drain to assist with this however.  After dissecting the mesh from the floor I found the sutures and cut them and then went from a cranial to caudal direction to dissect the mesh carefully away tubular 0 of the cord structures.  Once the mesh was elevated off I could easily identify the entrapped ilioinguinal nerve and I divided this as it exited the muscle allowed and the other is to retract back.  We then were able to open the ring up with the mesh.  I then was able to take the floor the mesh out carefully preserving the above-mentioned structures.  The entire mesh was removed.  I then identified the iliohypogastric nerve and divided it with cautery. The genital branch of the genitofemoral nerve was divided.  Once the mesh was removed I inspected for hemostasis it was excellent.  All structures the cord appeared preserved.  I then used a 3 inch x 6 since ultra pro mesh.  This was cut in a keel configuration.  This is  placed onto the floor the anal canal and secured to the shelving edge of the inguinal ligament, pubic tubercle and the transversus fascia with #1 Novafil circumferentially.  A slit was cut for the cord structures.  The tails were secured with 2-0 Novafil.  There is ample room for the cord to exit with no constriction or undue tension and I could fit my fifth digit through the new internal ring without tension.  Hemostasis was achieved.  We then closed the aponeurosis of the external Blake with 0 Vicryl.  3-0 Vicryl was used approximate Scarpa's fascia and 4-0 Monocryl was used to close the skin.  Dermabond was applied.  All counts were found to be correct.  The patient was awoke extubated taken to recovery in satisfactory condition.

## 2020-02-15 NOTE — Anesthesia Postprocedure Evaluation (Signed)
Anesthesia Post Note  Patient: Anthony King  Procedure(s) Performed: LAPAROSCOPY DIAGNOSTIC (N/A Groin) RIGHT GROIN EXPLORATION WITH MESH REMOVAL (Right Groin) INSERTION OF MESH (Right Groin)     Patient location during evaluation: Phase II Anesthesia Type: General Level of consciousness: awake and sedated Pain management: pain level controlled Vital Signs Assessment: post-procedure vital signs reviewed and stable Respiratory status: spontaneous breathing Cardiovascular status: stable Postop Assessment: no apparent nausea or vomiting Anesthetic complications: no   No complications documented.  Last Vitals:  Vitals:   02/15/20 1110 02/15/20 1125  BP: 124/82 121/71  Pulse: 73 72  Resp: 19 20  Temp: (!) 36.1 C   SpO2: 91% 93%    Last Pain:  Vitals:   02/15/20 1110  TempSrc:   PainSc: Anthony King

## 2020-02-15 NOTE — Anesthesia Procedure Notes (Signed)
Procedure Name: Intubation Date/Time: 02/15/2020 7:53 AM Performed by: Michele Rockers, CRNA Pre-anesthesia Checklist: Patient identified, Emergency Drugs available, Suction available and Patient being monitored Patient Re-evaluated:Patient Re-evaluated prior to induction Oxygen Delivery Method: Circle system utilized Preoxygenation: Pre-oxygenation with 100% oxygen Induction Type: IV induction Ventilation: Mask ventilation without difficulty Laryngoscope Size: Miller and 2 Grade View: Grade I Tube type: Oral Tube size: 7.5 mm Number of attempts: 1 Airway Equipment and Method: Stylet and Oral airway Placement Confirmation: ETT inserted through vocal cords under direct vision,  positive ETCO2 and breath sounds checked- equal and bilateral Secured at: 22 cm Tube secured with: Tape Dental Injury: Teeth and Oropharynx as per pre-operative assessment

## 2020-02-15 NOTE — Anesthesia Procedure Notes (Signed)
Anesthesia Regional Block: TAP block   Pre-Anesthetic Checklist: ,, timeout performed, Correct Patient, Correct Site, Correct Laterality, Correct Procedure, Correct Position, site marked, Risks and benefits discussed,  Surgical consent,  Pre-op evaluation,  At surgeon's request and post-op pain management  Laterality: Left  Prep: chloraprep       Needles:  Injection technique: Single-shot  Needle Type: Echogenic Stimulator Needle     Needle Length: 9cm  Needle Gauge: 20   Needle insertion depth: 2 cm   Additional Needles:   Procedures:,,,, ultrasound used (permanent image in chart),,,,  Narrative:  Start time: 02/15/2020 7:28 AM End time: 02/15/2020 7:35 AM Injection made incrementally with aspirations every 5 mL.  Performed by: Personally  Anesthesiologist: Lyn Hollingshead, MD

## 2020-02-15 NOTE — Discharge Instructions (Signed)
General Anesthesia, Adult, Care After This sheet gives you information about how to care for yourself after your procedure. Your health care provider may also give you more specific instructions. If you have problems or questions, contact your health care provider. What can I expect after the procedure? After the procedure, the following side effects are common:  Pain or discomfort at the IV site.  Nausea.  Vomiting.  Sore throat.  Trouble concentrating.  Feeling cold or chills.  Weak or tired.  Sleepiness and fatigue.  Soreness and body aches. These side effects can affect parts of the body that were not involved in surgery. Follow these instructions at home:  For at least 24 hours after the procedure:  Have a responsible adult stay with you. It is important to have someone help care for you until you are awake and alert.  Rest as needed.  Do not: ? Participate in activities in which you could fall or become injured. ? Drive. ? Use heavy machinery. ? Drink alcohol. ? Take sleeping pills or medicines that cause drowsiness. ? Make important decisions or sign legal documents. ? Take care of children on your own. Eating and drinking  Follow any instructions from your health care provider about eating or drinking restrictions.  When you feel hungry, start by eating small amounts of foods that are soft and easy to digest (bland), such as toast. Gradually return to your regular diet.  Drink enough fluid to keep your urine pale yellow.  If you vomit, rehydrate by drinking water, juice, or clear broth. General instructions  If you have sleep apnea, surgery and certain medicines can increase your risk for breathing problems. Follow instructions from your health care provider about wearing your sleep device: ? Anytime you are sleeping, including during daytime naps. ? While taking prescription pain medicines, sleeping medicines, or medicines that make you drowsy.  Return to  your normal activities as told by your health care provider. Ask your health care provider what activities are safe for you.  Take over-the-counter and prescription medicines only as told by your health care provider.  If you smoke, do not smoke without supervision.  Keep all follow-up visits as told by your health care provider. This is important. Contact a health care provider if:  You have nausea or vomiting that does not get better with medicine.  You cannot eat or drink without vomiting.  You have pain that does not get better with medicine.  You are unable to pass urine.  You develop a skin rash.  You have a fever.  You have redness around your IV site that gets worse. Get help right away if:  You have difficulty breathing.  You have chest pain.  You have blood in your urine or stool, or you vomit blood. Summary  After the procedure, it is common to have a sore throat or nausea. It is also common to feel tired.  Have a responsible adult stay with you for the first 24 hours after general anesthesia. It is important to have someone help care for you until you are awake and alert.  When you feel hungry, start by eating small amounts of foods that are soft and easy to digest (bland), such as toast. Gradually return to your regular diet.  Drink enough fluid to keep your urine pale yellow.  Return to your normal activities as told by your health care provider. Ask your health care provider what activities are safe for you. This information is not   intended to replace advice given to you by your health care provider. Make sure you discuss any questions you have with your health care provider. Document Revised: 06/18/2017 Document Reviewed: 01/29/2017 Elsevier Patient Education  2020 [place] _______[facility]  UMBILICAL OR INGUINAL HERNIA REPAIR: POST OP INSTRUCTIONS  Always review your discharge instruction sheet given to you by the facility  where your surgery was performed. IF YOU HAVE DISABILITY OR FAMILY LEAVE FORMS, YOU MUST BRING THEM TO THE OFFICE FOR PROCESSING.   DO NOT GIVE THEM TO YOUR DOCTOR.  1. A  prescription for pain medication may be given to you upon discharge.  Take your pain medication as prescribed, if needed.  If narcotic pain medicine is not needed, then you may take acetaminophen (Tylenol) or ibuprofen (Advil) as needed. 2. Take your usually prescribed medications unless otherwise directed. If you need a refill on your pain medication, please contact your pharmacy.  They will contact our office to request authorization. Prescriptions will not be filled after 5 pm or on week-ends. 3. You should follow a light diet the first 24 hours after arrival home, such as soup and crackers, etc.  Be sure to include lots of fluids daily.  Resume your normal diet the day after surgery. 4.Most patients will experience some swelling and bruising around the umbilicus or in the groin and scrotum.  Ice packs and reclining will help.  Swelling and bruising can take several days to resolve.  6. It is common to experience some constipation if taking pain medication after surgery.  Increasing fluid intake and taking a stool softener (such as Colace) will usually help or prevent this problem from occurring.  A mild laxative (Milk of Magnesia or Miralax) should be taken according to package directions if there are no bowel movements after 48 hours. 7. Unless discharge instructions indicate otherwise, you may remove your bandages 24-48 hours after surgery, and you may shower at that time.  You may have steri-strips (small skin tapes) in place directly over the incision.  These strips should be left on the skin for 7-10 days.  If your surgeon used skin glue on the incision, you may shower in 24 hours.  The glue will flake off over the next 2-3 weeks.  Any sutures or staples will be removed at the office during your follow-up visit. 8. ACTIVITIES:   You may resume regular (light) daily activities beginning the next day--such as daily self-care, walking, climbing stairs--gradually increasing activities as tolerated.  You may have sexual intercourse when it is comfortable.  Refrain from any heavy lifting or straining until approved by your doctor.  a.You may drive when you are no longer taking prescription pain medication, you can comfortably wear a seatbelt, and you can safely maneuver your car and apply brakes. b.RETURN TO WORK:   _____________________________________________  9.You should see your doctor in the office for a follow-up appointment approximately 2-3 weeks after your surgery.  Make sure that you call for this appointment within a day or two after you arrive home to insure a convenient appointment time. 10.OTHER INSTRUCTIONS: _________________________    _____________________________________  WHEN TO CALL YOUR DOCTOR: 1. Fever over 101.0 2. Inability to urinate 3. Nausea and/or vomiting 4. Extreme swelling or bruising 5. Continued bleeding from incision. 6. Increased pain, redness, or drainage from the incision  The clinic staff is available to answer your questions during regular business hours.  Please don't hesitate to call and ask to speak to  one of the nurses for clinical concerns.  If you have a medical emergency, go to the nearest emergency room or call 911.  A surgeon from [facility] is always on call at the hospital   [address] [place], [place], [place]  [address] ?  [address], [place], [place]   [address] [phone] ? [phone] ? FAX [phone] Web site: [email or web address]

## 2020-02-16 ENCOUNTER — Encounter (HOSPITAL_COMMUNITY): Payer: Self-pay | Admitting: Surgery

## 2020-03-06 DIAGNOSIS — H40033 Anatomical narrow angle, bilateral: Secondary | ICD-10-CM | POA: Diagnosis not present

## 2020-03-06 DIAGNOSIS — H43393 Other vitreous opacities, bilateral: Secondary | ICD-10-CM | POA: Diagnosis not present

## 2020-03-18 ENCOUNTER — Other Ambulatory Visit: Payer: Self-pay

## 2020-03-18 ENCOUNTER — Other Ambulatory Visit: Payer: Medicare Other | Admitting: *Deleted

## 2020-03-18 DIAGNOSIS — Z79899 Other long term (current) drug therapy: Secondary | ICD-10-CM | POA: Diagnosis not present

## 2020-03-21 ENCOUNTER — Telehealth: Payer: Self-pay | Admitting: Physician Assistant

## 2020-03-21 DIAGNOSIS — E785 Hyperlipidemia, unspecified: Secondary | ICD-10-CM

## 2020-03-21 LAB — HEPATIC FUNCTION PANEL
ALT: 20 IU/L (ref 0–44)
AST: 21 IU/L (ref 0–40)
Albumin: 4.3 g/dL (ref 3.8–4.8)
Alkaline Phosphatase: 107 IU/L (ref 44–121)
Bilirubin Total: 1.3 mg/dL — ABNORMAL HIGH (ref 0.0–1.2)
Bilirubin, Direct: 0.32 mg/dL (ref 0.00–0.40)
Total Protein: 6.2 g/dL (ref 6.0–8.5)

## 2020-03-21 LAB — LIPID PANEL
Chol/HDL Ratio: 3.8 ratio (ref 0.0–5.0)
Cholesterol, Total: 147 mg/dL (ref 100–199)
HDL: 39 mg/dL — ABNORMAL LOW (ref >39)
LDL Chol Calc (NIH): 91 mg/dL (ref 0–99)
Triglycerides: 88 mg/dL (ref 0–149)
VLDL Cholesterol Cal: 17 mg/dL (ref 5–40)

## 2020-03-21 NOTE — Telephone Encounter (Signed)
Anthony Pitter, PA-C  03/21/2020 2:39 PM EDT     Covering Anthony King's inbox. Please let pt know lipid profile showed dramatic improvement in cholesterol with starting atorvastatin - LDL 173->91. Great news!! He has history of nonobstructive CAD in the past so goal LDL is typically <70. It sounds like from Anthony King's note in June he's really had a terrible time after having Covid so we have 2 options. If he feels he can work on diet/activity we can pause on increasing medicine further and check recheck liver/lipids under [place] in about 8 weeks. If he already feels like he's doing his best lifestyle wise and tolerating current dose of atorvastatin, would trial increase of 80mg  daily with repeat liver/lipids in 8 weeks. Total bilirubin (liver marker) just 0.1 pt above normal and actually better than prior, so this can simply be trended in the future. Otherwise liver function looks good. Melina Copa PA-C    Informed patient of results. Patient would ike to try diet and exercise and repeat lab work on November 18th. Orders placed.

## 2020-03-21 NOTE — Telephone Encounter (Signed)
Patient is calling for results. Please call back 

## 2020-03-27 ENCOUNTER — Ambulatory Visit (INDEPENDENT_AMBULATORY_CARE_PROVIDER_SITE_OTHER): Payer: Medicare Other | Admitting: Internal Medicine

## 2020-03-27 ENCOUNTER — Encounter: Payer: Self-pay | Admitting: Internal Medicine

## 2020-03-27 ENCOUNTER — Other Ambulatory Visit: Payer: Self-pay

## 2020-03-27 DIAGNOSIS — J1282 Pneumonia due to coronavirus disease 2019: Secondary | ICD-10-CM

## 2020-03-27 DIAGNOSIS — U071 COVID-19: Secondary | ICD-10-CM | POA: Diagnosis not present

## 2020-03-27 DIAGNOSIS — R0600 Dyspnea, unspecified: Secondary | ICD-10-CM

## 2020-03-27 DIAGNOSIS — R0609 Other forms of dyspnea: Secondary | ICD-10-CM

## 2020-03-27 LAB — PULMONARY FUNCTION TEST
DL/VA % pred: 105 %
DL/VA: 4.24 ml/min/mmHg/L
DLCO cor % pred: 69 %
DLCO cor: 19.02 ml/min/mmHg
DLCO unc % pred: 70 %
DLCO unc: 19.33 ml/min/mmHg
FEF 25-75 Post: 3.07 L/sec
FEF 25-75 Pre: 3.42 L/sec
FEF2575-%Change-Post: -10 %
FEF2575-%Pred-Post: 115 %
FEF2575-%Pred-Pre: 128 %
FEV1-%Change-Post: 11 %
FEV1-%Pred-Post: 74 %
FEV1-%Pred-Pre: 66 %
FEV1-Post: 2.59 L
FEV1-Pre: 2.31 L
FEV1FVC-%Change-Post: 1 %
FEV1FVC-%Pred-Pre: 117 %
FEV6-%Change-Post: 10 %
FEV6-%Pred-Post: 66 %
FEV6-%Pred-Pre: 59 %
FEV6-Post: 2.95 L
FEV6-Pre: 2.67 L
FEV6FVC-%Pred-Post: 105 %
FEV6FVC-%Pred-Pre: 105 %
FVC-%Change-Post: 10 %
FVC-%Pred-Post: 62 %
FVC-%Pred-Pre: 56 %
FVC-Post: 2.95 L
FVC-Pre: 2.67 L
Post FEV1/FVC ratio: 88 %
Post FEV6/FVC ratio: 100 %
Pre FEV1/FVC ratio: 87 %
Pre FEV6/FVC Ratio: 100 %
RV % pred: 79 %
RV: 1.99 L
TLC % pred: 73 %
TLC: 5.4 L

## 2020-03-27 MED ORDER — ALBUTEROL SULFATE HFA 108 (90 BASE) MCG/ACT IN AERS: 2.0000 | INHALATION_SPRAY | Freq: Four times a day (QID) | RESPIRATORY_TRACT | 1 refills | Status: DC | PRN

## 2020-03-27 MED ORDER — BUDESONIDE-FORMOTEROL FUMARATE 80-4.5 MCG/ACT IN AERO: INHALATION_SPRAY | RESPIRATORY_TRACT | 12 refills | Status: DC

## 2020-03-27 NOTE — Patient Instructions (Addendum)
Plan A = Automatic = Always=    Symbicort 80 2 pffs first thing in am then again 12 hour later if you feel you need   Plan B = Backup (to supplement plan A, not to replace it) Only use your albuterol inhaler as a rescue medication to be used if you can't catch your breath by resting or doing a relaxed purse lip breathing pattern.  - The less you use it, the better it will work when you need it. - Ok to use the inhaler up to 2 puffs  every 4 hours if you must but call for appointment if use goes up over your usual need - Don't leave home without it !!  (think of it like the spare tire for your car)   Please schedule a follow up visit in 3 months but call sooner if needed  - late add cxr on return

## 2020-03-27 NOTE — Assessment & Plan Note (Signed)
Admit 10/18/19 rx remdesivir and dex / d/c on 02 - no desats walking at f/u ov 12/07/2019  Or  01/31/2020  - PFT's  03/27/2020  FEV1  2.59 (74 % ) ratio 0.88  p 11 % improvement from saba p 0 prior to study with DLCO  19.33 (70%) corrects to 4.24 (104%)  for alv volume and FV curve min concavity and ERV 1% > trial of symbicort 80 2 q am and prn in pm    He has very mild residual obst airways dz sp covid with a restrictive component that may be due to body habitus based on ERV so rec symbicort 80 2 each am and pm doses x 2 prn Based on two studies from NEJM  378; 20 p 1865 (2018) and 380 : p2020-30 (2019) in pts with mild asthma it is reasonable to use low dose symbicort eg 80 2bid "prn" flare in this setting but I emphasized this was only shown with symbicort and takes advantage of the rapid onset of action but is not the same as "rescue therapy" but can be stopped once the acute symptoms have resolved and the need for rescue has been minimized (< 2 x weekly)    F/u in 3 m with cxr

## 2020-03-27 NOTE — Progress Notes (Signed)
Full PFT performed today. °

## 2020-03-27 NOTE — Progress Notes (Signed)
Anthony King, male    DOB: 1950/11/11   MRN: [id number]   Brief patient profile:  89 yowm quit smoking 1993 very healthy upholstered furniture then acutely ill with covid 19 admit [facility]    Admit date: 10/18/2019 Discharge date: 10/25/2019   Discharge Condition: Stable Code Status: FULL Diet recommendation: Heart Healthy  Brief/Interim Summary: 69 y.o.M with CKD IIIa, GERD who presented with 1 week progressive SOB and hypoxia to 90% at home.In the ER, CXR showed bilateral infiltrates and patient was hypoxic. He was found to be positive for COVID-19.  [facility] were consulted to admit the patient for further evaluation and care.  Patient completed  IV remdesevir x 5 days and on steroids.Hereceived IV lasix for a few days. This has now been stopped.  Was managed with current Covid protocol.  He has not significantly improved.  Today he was able to, with oxygen on room air saturating 90% on ambulation on room air 86% placed on 2 L nasal cannula and improved to 94%. He denies any shortness of breath chest pain nausea vomiting fever chills.  He is tolerating diet well. He is anxious and wants to go home today. Reports his wife and other family numbers were tested positive but they are doing fine. Plan of care was discussed with the patient's wife okay for discharge home today. Home health PT and home o2 will be set up on discharge.   Assessment & Plan: Hypoxic respiratory failure due to COVID-19 pneumonia /pneumonia due to COVID-19 virus: Completed remdesivir on 4/25.he was on high flow nasal cannula and has been weaned down to 2 L and today came off the oxygen and doing well on room air but needing oxygen while on ambulation.  He feels fine is persistent on going home today.  We will discharge him on oral Decadron to complete the course, and also home oxygen and home health PT.  He needs to continue with the 21 days of quarantine since disease  onset        History of Present Illness  12/07/2019  Pulmonary/ 1st office eval/Jenae Tomasello stopped 02 because sats ok at rest  Due to get covid 2nd shot June 21  Chief Complaint  Patient presents with  . Consult    post covid SOB, covid in 05/21  Dyspnea:  mb is 640 ft minimal incline  Cough: none Sleep: on 2 pillows able to sleep thru the night - baseline 1 pillow SABA use: p walked thru house am of ov (never re challenges or prechallenges. rec Continue prevacid 30 mg Take 30- 60 min before your first and last meals of the day  GERD diet      01/31/2020  f/u ov/Channon Ambrosini re: post covid sob  Chief Complaint  Patient presents with  . Follow-up    reports feeling at baseline status. last use of albuterol yesterday am. states he is having GI procedure on 02/15/20./  Dyspnea:  Now walking to mb which 640 ft mostly flat s stopping / sob bending over Cough: much better  Sleeping: one pillow bed flat / no resp symptom SABA use:  never pre challenges  02: none  rec Try albuterol 15 min before an activity > did not do   GERD diet     03/27/2020  f/u ov/Doniqua Saxby re: doe p covid some better  p am dose of saba Chief Complaint  Patient presents with  . Follow-up  Dyspnea:  Much better  Cough: gone  Sleeping: in  bed flat/ one pillow  SABA use: first thing in am and not after that  - never pre or rechallenges as rec 02: none    No obvious day to day or daytime variability or assoc excess/ purulent sputum or mucus plugs or hemoptysis or cp or chest tightness, subjective wheeze or overt sinus or hb symptoms.   Sleeping without nocturnal  or early am exacerbation  of respiratory  c/o's or need for noct saba. Also denies any obvious fluctuation of symptoms with weather or environmental changes or other aggravating or alleviating factors except as outlined above   No unusual exposure hx or h/o childhood pna/ asthma or knowledge of premature birth.  Current Allergies, Complete Past Medical History,  Past Surgical History, Family History, and Social History were reviewed in [employer] record.  ROS  The following are not active complaints unless bolded Hoarseness, sore throat, dysphagia, dental problems, itching, sneezing,  nasal congestion or discharge of excess mucus or purulent secretions, ear ache,   fever, chills, sweats, unintended wt loss or wt gain, classically pleuritic or exertional cp,  orthopnea pnd or arm/hand swelling  or leg swelling improved, presyncope, palpitations, abdominal pain, anorexia, nausea, vomiting, diarrhea  or change in bowel habits or change in bladder habits, change in stools or change in urine, dysuria, hematuria,  rash, arthralgias, visual complaints, headache, numbness, weakness or ataxia or problems with walking or coordination,  change in mood or  memory.        Current Meds  Medication Sig  . albuterol (VENTOLIN HFA) 108 (90 Base) MCG/ACT inhaler Inhale 2 puffs into the lungs every 6 (six) hours as needed for wheezing or shortness of breath.  . loratadine (CLARITIN) 10 MG tablet Take 10 mg by mouth daily.  . pantoprazole (PROTONIX) 40 MG tablet Take 30- 60 min before your first and last meals of the day (Patient taking differently: Take 40 mg by mouth 2 (two) times daily. Take 30- 60 min before your first and last meals of the day)  . Psyllium (METAMUCIL FREE & NATURAL) 43 % POWD Take 2 mLs by mouth daily. Metamucil with coffee  . vitamin B-12 (CYANOCOBALAMIN) 1000 MCG tablet Take 1,000 mcg by mouth daily.   . [DISCONTINUED] oxyCODONE (OXY IR/ROXICODONE) 5 MG immediate release tablet Take 1 tablet (5 mg total) by mouth every 6 (six) hours as needed for severe pain.                         Past Medical History:  Diagnosis Date  . Arthritis 07-17-11   hands, hips, knees  . Basal cell carcinoma    nose  . Chronic kidney disease 07-17-11   past kidney stone x1  . Colon polyps    ADENOMATOUS AND HYPERPLASTIC POLYPS  .  Diverticulosis   . Gastric ulcer   . GERD (gastroesophageal reflux disease)   . Hemorrhoids   . Hyperlipidemia   . Wears glasses        Objective:     03/27/2020       223   01/31/20 215 lb (97.5 kg)  12/20/19 211 lb 9.6 oz (96 kg)  12/07/19 208 lb (94.3 kg)      amb wm nad   Vital signs reviewed  03/27/2020  - Note at rest 02 sats  98% on RA     HEENT : pt wearing mask not removed for exam due to covid -19 concerns.    NECK :  without JVD/Nodes/TM/ nl carotid upstrokes bilaterally   LUNGS: no acc muscle use,  Nl contour chest which is clear to A and P bilaterally without cough on insp or exp maneuvers   CV:  RRR  no s3 or murmur or increase in P2, and no edema   ABD:  soft and nontender with nl inspiratory excursion in the supine position. No bruits or organomegaly appreciated, bowel sounds nl  MS:  Nl gait/ ext warm without deformities, calf tenderness, cyanosis or clubbing No obvious joint restrictions   SKIN: warm and dry without lesions    NEURO:  alert, approp, nl sensorium with  no motor or cerebellar deficits apparent.                      Assessment

## 2020-05-16 ENCOUNTER — Other Ambulatory Visit: Payer: Medicare Other

## 2020-06-26 DIAGNOSIS — Z23 Encounter for immunization: Secondary | ICD-10-CM | POA: Diagnosis not present

## 2020-07-01 ENCOUNTER — Ambulatory Visit: Payer: Medicare Other | Admitting: Internal Medicine

## 2020-07-26 ENCOUNTER — Other Ambulatory Visit: Payer: Self-pay

## 2020-07-26 ENCOUNTER — Encounter: Payer: Self-pay | Admitting: Gastroenterology

## 2020-07-26 ENCOUNTER — Ambulatory Visit (INDEPENDENT_AMBULATORY_CARE_PROVIDER_SITE_OTHER): Payer: Medicare Other | Admitting: Gastroenterology

## 2020-07-26 VITALS — BP 124/70 | HR 80 | Ht 69.25 in | Wt 223.0 lb

## 2020-07-26 DIAGNOSIS — K642 Third degree hemorrhoids: Secondary | ICD-10-CM

## 2020-07-26 DIAGNOSIS — K625 Hemorrhage of anus and rectum: Secondary | ICD-10-CM | POA: Diagnosis not present

## 2020-07-26 NOTE — Patient Instructions (Addendum)
If you are age 70 or older, your body mass index should be between 23-30. Your Body mass index is 32.69 kg/m. If this is out of the aforementioned range listed, please consider follow up with your Primary Care Provider.  If you are age 23 or younger, your body mass index should be between 19-25. Your Body mass index is 32.69 kg/m. If this is out of the aformentioned range listed, please consider follow up with your Primary Care Provider.   HEMORRHOID BANDING PROCEDURE    FOLLOW-UP CARE   1. The procedure you have had should have been relatively painless since the banding of the area involved does not have nerve endings and there is no pain sensation.  The rubber band cuts off the blood supply to the hemorrhoid and the band may fall off as soon as 48 hours after the banding (the band may occasionally be seen in the toilet bowl following a bowel movement). You may notice a temporary feeling of fullness in the rectum which should respond adequately to plain Tylenol or Motrin.  2. Following the banding, avoid strenuous exercise that evening and resume full activity the next day.  A sitz bath (soaking in a warm tub) or bidet is soothing, and can be useful for cleansing the area after bowel movements.     3. To avoid constipation, take two tablespoons of natural wheat bran, natural oat bran, flax, Benefiber or any over the counter fiber supplement and increase your water intake to 7-8 glasses daily.    4. Unless you have been prescribed anorectal medication, do not put anything inside your rectum for two weeks: No suppositories, enemas, fingers, etc.  5. Occasionally, you may have more bleeding than usual after the banding procedure.  This is often from the untreated hemorrhoids rather than the treated one.  Don't be concerned if there is a tablespoon or so of blood.  If there is more blood than this, lie flat with your bottom higher than your head and apply an ice pack to the area. If the bleeding  does not stop within a half an hour or if you feel faint, call our office at [phone] or go to the emergency room.  6. Problems are not common; however, if there is a substantial amount of bleeding, severe pain, chills, fever or difficulty passing urine (very rare) or other problems, you should call us at [phone] or report to the nearest emergency room.  7. Do not stay seated continuously for more than 2-3 hours for a day or two after the procedure.  Tighten your buttock muscles 10-15 times every two hours and take 10-15 deep breaths every 1-2 hours.  Do not spend more than a few minutes on the toilet if you cannot empty your bowel; instead re-visit the toilet at a later time.    We have given you samples of the following medication to take: Calmol suppositories (Do not start using these for 72 hours after your banding).   Then use as directed. If you like these you can purchase more at [facility].  Continue Metamucil.  Please call us at [phone] if you would like to schedule another potential banding appointment.  Thank you for entrusting me with your care and for choosing [facility], Dr. Riverside Cellar

## 2020-07-26 NOTE — Progress Notes (Signed)
HPI :  70 year old male with a history of colon adenomas and hemorrhoids, here for follow-up visit for hemorrhoids.  I previously saw him in late 2019 to early 2020 for symptomatic hemorrhoids which included prolapse and bleeding.  He underwent banding x3 with a good response and states his symptoms were really well controlled and he was doing much better.  Over the past few months he has had some recurrence of bleeding and prolapse particularly on the right side of his rectum, he feels there is an inflamed hemorrhoid there.  When it prolapses out it is difficult to clean himself and is irritating.  He has been using hydrocortisone cream as needed which can take the edge off.  He otherwise denies any other perianal pain or or problems with his bowels otherwise.  He is taking fiber, Metamucil every day and has regular bowel movements without straining.  He states things are certainly better than they were prebanding but this recent occurrence is rather frustrating for him.  He has blood mostly on the toilet paper when he wipes himself.  No abdominal pain.  His last colonoscopy with me was in March 2021 as below.  10 small adenomas removed, some internal hemorrhoids appreciated with stigmata of prior banding, and diverticulosis.  Colonoscopy 08/17/2016 - 11 small polyps removed, diverticulosis, hemorrhoids - 6 polyps c/w adenoma - repeat colonoscopy in 2021  Banded hemorrhoids x 3 and finished protocol 07/29/18  Colonoscopy 09/05/19 - The perianal and digital rectal examinations were normal, hemorrhoids noted. - Four sessile polyps were found in the ascending colon. The polyps were 3 to 4 mm in size. These polyps were removed with a cold snare. Resection and retrieval were complete. - Two sessile polyps were found in the hepatic flexure. The polyps were 3 to 6 mm in size. These polyps were removed with a cold snare. Resection and retrieval were complete. - Four sessile polyps were found in the  transverse colon. The polyps were 3 to 4 mm in size. These polyps were removed with a cold snare. Resection and retrieval were complete. - Multiple medium-mouthed diverticula were found in the transverse colon, cecum and left colon. - Internal hemorrhoids were found during retroflexion with stigmata of prior banding. - The exam was otherwise without abnormality.     Past Medical History:  Diagnosis Date  . Aortic atherosclerosis ([place]) 03/19/2018  . Arthritis 07-17-11   hands, hips, knees  . Basal cell carcinoma    nose  . CAD (coronary artery disease), native coronary artery 03/19/2018  . Chronic kidney disease 07-17-11   past kidney stone x1  . CKD (chronic kidney disease), stage III ([place]) 03/18/2018  . Colon polyps    ADENOMATOUS AND HYPERPLASTIC POLYPS  . COVID-19   . Diverticulosis   . DOE (dyspnea on exertion) 12/07/2019   Onset with covid 19 pna  - see adimit 10/18/19  -  12/07/2019   Walked RA  3 laps @ approx 259ft each @ slow slt unsteady  pace  stopped due to sob / weakness but sats never less than 92%   . Dyspnea    with exertion  . Gastric ulcer   . GERD (gastroesophageal reflux disease)   . Hemorrhoids   . History of kidney stones   . Hyperlipidemia   . Hypoxia 10/18/2019  . Left lower quadrant abdominal pain 05/31/2018  . Pneumonia   . Pre-diabetes   . Prediabetes 03/19/2018  . Unstable angina ([place]) 03/19/2018  . Wears glasses  Past Surgical History:  Procedure Laterality Date  . APPENDECTOMY    . BASAL CELL CARCINOMA EXCISION     nose  . CARDIAC CATHETERIZATION    . COLONOSCOPY    . EXCISION OF MESH Right 02/15/2020   Procedure: EXPLANTATION OF MESH;  Surgeon: Harriette Bouillonornett, Thomas, MD;  Location: [facility];  Service: General;  Laterality: Right;  . fatty tumors  07-17-11   excised x4- 1 -neck, 1 arm, 2 chest  . GROIN DISSECTION Right 02/15/2020   Procedure: RIGHT GROIN NEURECTOMY;  Surgeon: Harriette Bouillonornett, Thomas, MD;  Location: [facility];  Service: General;  Laterality:  Right;  DR FAROOQUI APPROVED CORNETT USING HIS TIME PER ABBIE  . HEMORRHOID BANDING    . INGUINAL HERNIA REPAIR  07/23/2011   Procedure: LAPAROSCOPIC INGUINAL HERNIA;  Surgeon: Almond LintFaera Byerly, MD;  Location: [facility];  Service: General;  Laterality: Left;  . INGUINAL HERNIA REPAIR Bilateral 04/19/2019   Procedure: BILATERAL OPEN INGUINAL HERNIA REPAIR WITH MESH;  Surgeon: Harriette Bouillonornett, Thomas, MD;  Location: [facility];  Service: General;  Laterality: Bilateral;  . INGUINAL HERNIA REPAIR Right 02/15/2020   Procedure: RIGHT HERNIA REPAIR INGUINAL WITH MESH;  Surgeon: Harriette Bouillonornett, Thomas, MD;  Location: [facility];  Service: General;  Laterality: Right;  . LAPAROSCOPY N/A 02/15/2020   Procedure: LAPAROSCOPY DIAGNOSTIC;  Surgeon: Harriette Bouillonornett, Thomas, MD;  Location: [facility];  Service: General;  Laterality: N/A;  . LEFT HEART CATH AND CORONARY ANGIOGRAPHY N/A 03/21/2018   Procedure: LEFT HEART CATH AND CORONARY ANGIOGRAPHY;  Surgeon: Corky CraftsVaranasi, Jayadeep S, MD;  [facility];  Service: Cardiovascular;  Laterality: N/A;  . MASS EXCISION N/A 08/29/2013   Procedure: EXCISION LEFT FLANK MASS/RIGHT EXCISION CHEST WALL MASS;  Surgeon: Almond LintFaera Byerly, MD;  Location: [facility];  Service: General;  Laterality: N/A;  Left flank  . POLYPECTOMY    . TONSILLECTOMY     Family History  Problem Relation Age of Onset  . Stomach cancer Father   . Colon cancer Father   . Stomach cancer Sister        Passed away 12/12  . Arthritis Mother   . Breast cancer Mother   . Pneumonia Mother   . Alzheimer's disease Mother   . Arthritis Paternal Grandmother   . Arthritis Maternal Grandmother   . Colon cancer Brother   . Esophageal cancer Neg Hx   . Rectal cancer Neg Hx    Social History   Tobacco Use  . Smoking status: Former Smoker    Types: Cigarettes    Quit date: 07/02/1991    Years since quitting: 29.0  . Smokeless tobacco: Never Used  Vaping Use  . Vaping Use: Never used  Substance Use  Topics  . Alcohol use: No  . Drug use: No   Current Outpatient Medications  Medication Sig Dispense Refill  . albuterol (VENTOLIN HFA) 108 (90 Base) MCG/ACT inhaler Inhale 2 puffs into the lungs every 6 (six) hours as needed for wheezing or shortness of breath. 18 g 1  . budesonide-formoterol (SYMBICORT) 80-4.5 MCG/ACT inhaler 2 puffs first thing in am  And 12 hours later take the 2nd two puffs if needed 1 each 12  . pantoprazole (PROTONIX) 40 MG tablet Take 30- 60 min before your first and last meals of the day (Patient taking differently: Take 40 mg by mouth 2 (two) times daily. Take 30- 60 min before your first and last meals of the day) 60 tablet 2  . Psyllium (METAMUCIL FREE &  NATURAL) 43 % POWD Take 2 mLs by mouth daily. Metamucil with coffee    . vitamin B-12 (CYANOCOBALAMIN) 1000 MCG tablet Take 1,000 mcg by mouth daily.      No current facility-administered medications for this visit.   Allergies  Allergen Reactions  . Aspirin Other (See Comments)    REACTION: nauseated, drooling     Review of Systems: All systems reviewed and negative except where noted in HPI.   Lab Results  Component Value Date   WBC 6.8 02/15/2020   HGB 15.2 02/15/2020   HCT 48.6 02/15/2020   MCV 84.4 02/15/2020   PLT 202 02/15/2020    Lab Results  Component Value Date   CREATININE 1.36 (H) 02/15/2020   BUN 13 02/15/2020   NA 139 02/15/2020   K 4.1 02/15/2020   CL 105 02/15/2020   CO2 26 02/15/2020    Lab Results  Component Value Date   ALT 20 03/18/2020   AST 21 03/18/2020   ALKPHOS 107 03/18/2020   BILITOT 1.3 (H) 03/18/2020     Physical Exam: BP 124/70 (BP Location: Left Arm, Patient Position: Sitting, Cuff Size: Normal)   Pulse 80   Ht 5' 9.25" (1.759 m) Comment: height measured without shoes  Wt 223 lb (101.2 kg)   BMI 32.69 kg/m  Constitutional: Pleasant,well-developed, male in no acute distress. Abdominal: Soft, nondistended, nontender. . There are no masses palpable.   DRE / Anoscopy - internal hemorrhoids, right side diffusely inflamed. CMA Tia Alert as standby. Extremities: no edema Lymphadenopathy: No cervical adenopathy noted. Neurological: Alert and oriented to person place and time. Skin: Skin is warm and dry. No rashes noted. Psychiatric: Normal mood and affect. Behavior is normal.   ASSESSMENT AND PLAN: 70 year old male here for reassessment the following:  Grade 3 hemorrhoids Rectal bleeding  On exam today he has grade 3 hemorrhoids in the right anterior and right posterior area that seem the most inflamed, specifically right anterior.  I discussed options with him.  He did respond quite well to banding a few years ago and did well until recent months.  Options are another banding in the area that is causing him symptoms versus surgical referral.  He wants to avoid surgery if at all possible and would like to try another course of banding in the affected area. I discussed risk benefits of hemorrhoid banding with him and he wanted to proceed.  Procedure note as outlined below, right anterior hemorrhoid banded today which he tolerated well.  I asked him to hold off on hydrocortisone cream or instrumentation of the rectal area for at least 3 to 4 days.  He can use some Calmol suppositories at that time for irritation, sample provided.  If symptoms persist we can try banding the right posterior hemorrhoid again if he really wishes to avoid surgical treatment, he can contact me in a few weeks and let me know how he is doing, I will add him in for another banding if he needs that.  Hopefully treatment today will resolve most of this for him.  Continue Metamucil otherwise.  Lesslie Cellar, MD [facility]    PROCEDURE NOTE: The patient presents with symptomatic grade III  hemorrhoids, requesting rubber band ligation of his/her hemorrhoidal disease.  All risks, benefits and alternative forms of therapy were described and informed consent was  obtained.  In the Left Lateral Decubitus position anoscopic examination revealed grade III hemorrhoids in the RA AND RP position(s).  The anorectum was pre-medicated with 0.125%  nitroglycerin ointment The decision was made to band the RA internal hemorrhoid, and the Ada was used to perform band ligation without complication.  Digital anorectal examination was then performed to assure proper positioning of the band, and to adjust the banded tissue as required.  The patient was discharged home without pain or other issues.  Dietary and behavioral recommendations were given and along with follow-up instructions.     The following adjunctive treatments were recommended: metamucil daily calmol suppository PRN  No complications were encountered and the patient tolerated the procedure well.

## 2020-08-12 ENCOUNTER — Ambulatory Visit (INDEPENDENT_AMBULATORY_CARE_PROVIDER_SITE_OTHER): Payer: Medicare Other | Admitting: Family Medicine

## 2020-08-12 ENCOUNTER — Encounter: Payer: Self-pay | Admitting: Family Medicine

## 2020-08-12 ENCOUNTER — Other Ambulatory Visit: Payer: Self-pay

## 2020-08-12 ENCOUNTER — Ambulatory Visit (HOSPITAL_BASED_OUTPATIENT_CLINIC_OR_DEPARTMENT_OTHER)
Admission: RE | Admit: 2020-08-12 | Discharge: 2020-08-12 | Disposition: A | Discharge status: Home / Self Care | Payer: Medicare Other | Source: Ambulatory Visit | Attending: Family Medicine | Admitting: Family Medicine

## 2020-08-12 VITALS — BP 122/80 | HR 66 | Temp 98.2°F | Resp 18 | Ht 69.25 in | Wt 225.2 lb

## 2020-08-12 DIAGNOSIS — R0609 Other forms of dyspnea: Secondary | ICD-10-CM

## 2020-08-12 DIAGNOSIS — F321 Major depressive disorder, single episode, moderate: Secondary | ICD-10-CM | POA: Diagnosis not present

## 2020-08-12 DIAGNOSIS — U099 Post covid-19 condition, unspecified: Secondary | ICD-10-CM | POA: Insufficient documentation

## 2020-08-12 DIAGNOSIS — E538 Deficiency of other specified B group vitamins: Secondary | ICD-10-CM

## 2020-08-12 DIAGNOSIS — R5383 Other fatigue: Secondary | ICD-10-CM | POA: Diagnosis not present

## 2020-08-12 DIAGNOSIS — I700 Atherosclerosis of aorta: Secondary | ICD-10-CM | POA: Diagnosis not present

## 2020-08-12 DIAGNOSIS — U071 COVID-19: Secondary | ICD-10-CM

## 2020-08-12 DIAGNOSIS — R0602 Shortness of breath: Secondary | ICD-10-CM | POA: Diagnosis not present

## 2020-08-12 DIAGNOSIS — K219 Gastro-esophageal reflux disease without esophagitis: Secondary | ICD-10-CM

## 2020-08-12 DIAGNOSIS — J1282 Pneumonia due to coronavirus disease 2019: Secondary | ICD-10-CM

## 2020-08-12 LAB — CBC WITH DIFFERENTIAL/PLATELET
Basophils Absolute: 0 10*3/uL (ref 0.0–0.1)
Basophils Relative: 0.7 % (ref 0.0–3.0)
Eosinophils Absolute: 0.2 10*3/uL (ref 0.0–0.7)
Eosinophils Relative: 2.5 % (ref 0.0–5.0)
HCT: 45.1 % (ref 39.0–52.0)
Hemoglobin: 14.7 g/dL (ref 13.0–17.0)
Lymphocytes Relative: 32.7 % (ref 12.0–46.0)
Lymphs Abs: 2 10*3/uL (ref 0.7–4.0)
MCHC: 32.6 g/dL (ref 30.0–36.0)
MCV: 78.1 fl (ref 78.0–100.0)
Monocytes Absolute: 0.7 10*3/uL (ref 0.1–1.0)
Monocytes Relative: 11.9 % (ref 3.0–12.0)
Neutro Abs: 3.2 10*3/uL (ref 1.4–7.7)
Neutrophils Relative %: 52.2 % (ref 43.0–77.0)
Platelets: 192 10*3/uL (ref 150.0–400.0)
RBC: 5.77 Mil/uL (ref 4.22–5.81)
RDW: 14.9 % (ref 11.5–15.5)
WBC: 6.2 10*3/uL (ref 4.0–10.5)

## 2020-08-12 LAB — COMPREHENSIVE METABOLIC PANEL
ALT: 21 U/L (ref 0–53)
AST: 20 U/L (ref 0–37)
Albumin: 4.1 g/dL (ref 3.5–5.2)
Alkaline Phosphatase: 96 U/L (ref 39–117)
BUN: 15 mg/dL (ref 6–23)
CO2: 26 mEq/L (ref 19–32)
Calcium: 9.1 mg/dL (ref 8.4–10.5)
Chloride: 107 mEq/L (ref 96–112)
Creatinine, Ser: 1.34 mg/dL (ref 0.40–1.50)
GFR: 53.97 mL/min — ABNORMAL LOW (ref >60.00)
Glucose, Bld: 92 mg/dL (ref 70–99)
Potassium: 4.3 mEq/L (ref 3.5–5.1)
Sodium: 139 mEq/L (ref 135–145)
Total Bilirubin: 1.2 mg/dL (ref 0.2–1.2)
Total Protein: 6.6 g/dL (ref 6.0–8.3)

## 2020-08-12 LAB — TSH: TSH: 3.92 u[IU]/mL (ref 0.35–4.50)

## 2020-08-12 LAB — VITAMIN B12: Vitamin B-12: 830 pg/mL (ref 211–911)

## 2020-08-12 MED ORDER — SERTRALINE HCL 50 MG PO TABS: 50.0000 mg | ORAL_TABLET | Freq: Every day | ORAL | 3 refills | Status: DC

## 2020-08-12 MED ORDER — LANSOPRAZOLE 30 MG PO CPDR: 30.0000 mg | DELAYED_RELEASE_CAPSULE | Freq: Every day | ORAL | 3 refills | Status: DC

## 2020-08-12 MED ORDER — BUDESONIDE-FORMOTEROL FUMARATE 80-4.5 MCG/ACT IN AERO: INHALATION_SPRAY | RESPIRATORY_TRACT | 12 refills | Status: DC

## 2020-08-12 MED ORDER — ALBUTEROL SULFATE HFA 108 (90 BASE) MCG/ACT IN AERS: 2.0000 | INHALATION_SPRAY | Freq: Four times a day (QID) | RESPIRATORY_TRACT | 1 refills | Status: DC | PRN

## 2020-08-12 NOTE — Assessment & Plan Note (Signed)
Check labs  Reassured pt that this is normal with covid  But we will check labs to make sure no changes F/u 1 month

## 2020-08-12 NOTE — Assessment & Plan Note (Signed)
Refill rx If not improvement will consider referral to pulmonary --pt is ok with this

## 2020-08-12 NOTE — Progress Notes (Signed)
Patient ID: Anthony King, male    DOB: February 12, 1951  Age: 70 y.o. MRN: [id number]    Subjective:  Subjective  HPI [name] presents for f/u sob from covid.  He was Positive for covid in April --- he is still struggling with fatigue and sob with exertion   The inhalers help but he needs a refill---- he cancelled his pulmonary app because he was not happy with his first appointment  He also c/o bursting out in tears for no reason --- he is very depressed and admits that.  He is requesting meds to help.    Review of Systems  Constitutional: Negative for appetite change, diaphoresis, fatigue and unexpected weight change.  Eyes: Negative for pain, redness and visual disturbance.  Respiratory: Negative for cough, chest tightness, shortness of breath and wheezing.   Cardiovascular: Negative for chest pain, palpitations and leg swelling.  Endocrine: Negative for cold intolerance, heat intolerance, polydipsia, polyphagia and polyuria.  Genitourinary: Negative for difficulty urinating, dysuria and frequency.  Neurological: Negative for dizziness, light-headedness, numbness and headaches.  Psychiatric/Behavioral: Positive for decreased concentration and dysphoric mood. Negative for confusion, self-injury, sleep disturbance and suicidal ideas. The patient is not nervous/anxious.     History Past Medical History:  Diagnosis Date  . Aortic atherosclerosis ([place]) 03/19/2018  . Arthritis 07-17-11   hands, hips, knees  . Basal cell carcinoma    nose  . CAD (coronary artery disease), native coronary artery 03/19/2018  . Chronic kidney disease 07-17-11   past kidney stone x1  . CKD (chronic kidney disease), stage III ([place]) 03/18/2018  . Colon polyps    ADENOMATOUS AND HYPERPLASTIC POLYPS  . COVID-19   . Diverticulosis   . DOE (dyspnea on exertion) 12/07/2019   Onset with covid 19 pna  - see adimit 10/18/19  -  12/07/2019   Walked RA  3 laps @ approx 2101ft each @ slow slt unsteady  pace   stopped due to sob / weakness but sats never less than 92%   . Dyspnea    with exertion  . Gastric ulcer   . GERD (gastroesophageal reflux disease)   . Hemorrhoids   . History of kidney stones   . Hyperlipidemia   . Hypoxia 10/18/2019  . Left lower quadrant abdominal pain 05/31/2018  . Pneumonia   . Pre-diabetes   . Prediabetes 03/19/2018  . Unstable angina ([place]) 03/19/2018  . Wears glasses     He has a past surgical history that includes Appendectomy; fatty tumors (07-17-11); Inguinal hernia repair (07/23/2011); Colonoscopy; Excision basal cell carcinoma; Mass excision (N/A, 08/29/2013); LEFT HEART CATH AND CORONARY ANGIOGRAPHY (N/A, 03/21/2018); Hemorrhoid banding; Inguinal hernia repair (Bilateral, 04/19/2019); Polypectomy; Tonsillectomy; Cardiac catheterization; laparoscopy (N/A, 02/15/2020); Groin dissection (Right, 02/15/2020); Excision of mesh (Right, 02/15/2020); and Inguinal hernia repair (Right, 02/15/2020).   His family history includes Alzheimer's disease in his mother; Arthritis in his maternal grandmother, mother, and paternal grandmother; Breast cancer in his mother; Colon cancer in his brother and father; Pneumonia in his mother; Stomach cancer in his father and sister.He reports that he quit smoking about 29 years ago. His smoking use included cigarettes. He has never used smokeless tobacco. He reports that he does not drink alcohol and does not use drugs.  Current Outpatient Medications on File Prior to Visit  Medication Sig Dispense Refill  . Psyllium (METAMUCIL FREE & NATURAL) 43 % POWD Take 2 mLs by mouth daily. Metamucil with coffee    . vitamin B-12 (CYANOCOBALAMIN) 1000 MCG  tablet Take 1,000 mcg by mouth daily.      No current facility-administered medications on file prior to visit.     Objective:  Objective  Physical Exam Vitals and nursing note reviewed.  Constitutional:      General: He is sleeping. Vital signs are normal.     Appearance: He is well-developed and  well-nourished.  HENT:     Head: Normocephalic and atraumatic.     Mouth/Throat:     Mouth: Oropharynx is clear and moist.  Eyes:     Extraocular Movements: EOM normal.     Pupils: Pupils are equal, round, and reactive to light.  Neck:     Thyroid: No thyromegaly.  Cardiovascular:     Rate and Rhythm: Normal rate and regular rhythm.     Heart sounds: No murmur heard.   Pulmonary:     Effort: Pulmonary effort is normal. No respiratory distress.     Breath sounds: Normal breath sounds. No wheezing or rales.  Chest:     Chest wall: No tenderness.  Musculoskeletal:        General: No tenderness or edema.     Cervical back: Normal range of motion and neck supple.  Skin:    General: Skin is warm and dry.  Neurological:     Mental Status: He is oriented to person, place, and time.  Psychiatric:        Mood and Affect: Mood is depressed. Mood is not anxious. Affect is tearful.        Speech: Speech normal.        Behavior: Behavior normal.        Thought Content: Thought content normal.        Cognition and Memory: Cognition normal.        Judgment: Judgment normal.    BP 122/80 (BP Location: Right Arm, Patient Position: Sitting, Cuff Size: Normal)   Pulse 66   Temp 98.2 F (36.8 C) (Oral)   Resp 18   Ht 5' 9.25" (1.759 m)   Wt 225 lb 3.2 oz (102.2 kg)   SpO2 98%   BMI 33.02 kg/m  Wt Readings from Last 3 Encounters:  08/12/20 225 lb 3.2 oz (102.2 kg)  07/26/20 223 lb (101.2 kg)  03/27/20 223 lb (101.2 kg)     Lab Results  Component Value Date   WBC 6.8 02/15/2020   HGB 15.2 02/15/2020   HCT 48.6 02/15/2020   PLT 202 02/15/2020   GLUCOSE 111 (H) 02/15/2020   CHOL 147 03/18/2020   TRIG 88 03/18/2020   HDL 39 (L) 03/18/2020   LDLDIRECT 141.0 06/11/2016   LDLCALC 91 03/18/2020   ALT 20 03/18/2020   AST 21 03/18/2020   NA 139 02/15/2020   K 4.1 02/15/2020   CL 105 02/15/2020   CREATININE 1.36 (H) 02/15/2020   BUN 13 02/15/2020   CO2 26 02/15/2020   PSA 0.68  06/11/2016   INR 1.01 03/18/2018   HGBA1C 5.7 (H) 03/18/2018    No results found.   Assessment & Plan:  Plan  I have discontinued Otila Back. Jennette "Robert"'s pantoprazole. I am also having him start on lansoprazole and sertraline. Additionally, I am having him maintain his vitamin B-12, Metamucil Free & Natural, albuterol, and budesonide-formoterol.  Meds ordered this encounter  Medications  . lansoprazole (PREVACID) 30 MG capsule    Sig: Take 1 capsule (30 mg total) by mouth daily at 12 noon.    Dispense:  90 capsule  Refill:  3  . sertraline (ZOLOFT) 50 MG tablet    Sig: Take 1 tablet (50 mg total) by mouth daily.    Dispense:  30 tablet    Refill:  3  . albuterol (VENTOLIN HFA) 108 (90 Base) MCG/ACT inhaler    Sig: Inhale 2 puffs into the lungs every 6 (six) hours as needed for wheezing or shortness of breath.    Dispense:  18 g    Refill:  1  . budesonide-formoterol (SYMBICORT) 80-4.5 MCG/ACT inhaler    Sig: 2 puffs first thing in am  And 12 hours later take the 2nd two puffs if needed    Dispense:  1 each    Refill:  12    Problem List Items Addressed This Visit      Unprioritized   Depression, major, single episode, moderate ([place])    New since covid Start zoloft and f/u 1 month or sooner prn  Will consider counselor as well      Relevant Medications   sertraline (ZOLOFT) 50 MG tablet   Other Relevant Orders   AMB Referral to [facility]   Esophageal reflux   Relevant Medications   lansoprazole (PREVACID) 30 MG capsule   Other Relevant Orders   AMB Referral to [place]   Other fatigue    Check labs  Reassured pt that this is normal with covid  But we will check labs to make sure no changes F/u 1 month       Relevant Orders   CBC with Differential/Platelet   TSH   Comprehensive metabolic panel   Vitamin C48   Pneumonia due to COVID-19 virus   Relevant Medications   albuterol (VENTOLIN HFA) 108 (90 Base) MCG/ACT  inhaler   budesonide-formoterol (SYMBICORT) 80-4.5 MCG/ACT inhaler   Other Relevant Orders   AMB Referral to [place]   Post-COVID chronic dyspnea - Primary    Refill rx If not improvement will consider referral to pulmonary --pt is ok with this       Relevant Medications   albuterol (VENTOLIN HFA) 108 (90 Base) MCG/ACT inhaler   budesonide-formoterol (SYMBICORT) 80-4.5 MCG/ACT inhaler   Other Relevant Orders   DG Chest 2 View   CBC with Differential/Platelet   TSH   Comprehensive metabolic panel   Vitamin G89   AMB Referral to [place]    Other Visit Diagnoses    B12 deficiency       Relevant Orders   Vitamin B12   Atherosclerosis of aorta ([place])   (Chronic)     Relevant Orders   AMB Referral to Community [facility] Coordin[facility]      Follow-up: Return in about 4 weeks (around 09/09/2020), or if symptoms worsen or fail to improve, for depression.  Ann Held, DO

## 2020-08-12 NOTE — Patient Instructions (Addendum)
Major Depressive Disorder, Adult Major depressive disorder (MDD) is a mental health condition. It may also be called clinical depression or unipolar depression. MDD causes symptoms of sadness, hopelessness, and loss of interest in things. These symptoms last most of the day, almost every day, for 2 weeks. MDD can also cause physical symptoms. It can interfere with relationships and with everyday activities, such as work, school, and activities that are usually pleasant. MDD may be mild, moderate, or severe. It may be single-episode MDD, which happens once, or recurrent MDD, which may occur multiple times. What are the causes? The exact cause of this condition is not known. MDD is most likely caused by a combination of things, which may include:  Your personality traits.  Learned or conditioned behaviors or thoughts or feelings that reinforce negativity.  Any alcohol or substance misuse.  Long-term (chronic) physical or mental health illness.  Going through a traumatic experience or major life changes. What increases the risk? The following factors may make someone more likely to develop MDD:  A family history of depression.  Being a woman.  Troubled family relationships.  Abnormally low levels of certain brain chemicals.  Traumatic or painful events in childhood, especially abuse or loss of a parent.  A lot of stress from life experiences, such as poor living conditions or discrimination.  Chronic physical illness or other mental health disorders. What are the signs or symptoms? The main symptoms of MDD usually include:  Constant depressed or irritable mood.  A loss of interest in things and activities. Other symptoms include:  Sleeping or eating too much or too little.  Unexplained weight gain or weight loss.  Tiredness or low energy.  Being agitated, restless, or weak.  Feeling hopeless, worthless, or guilty.  Trouble thinking clearly or making  decisions.  Thoughts of suicide or thoughts of harming others.  Isolating oneself or avoiding other people or activities.  Trouble completing tasks, work, or any normal obligations. Severe symptoms of this condition may include:  Psychotic depression.This may include false beliefs, or delusions. It may also include seeing, hearing, tasting, smelling, or feeling things that are not real (hallucinations).  Chronic depression or persistent depressive disorder. This is low-level depression that lasts for at least 2 years.  Melancholic depression, or feeling extremely sad and hopeless.  Catatonic depression, which includes trouble speaking and trouble moving. How is this diagnosed? This condition may be diagnosed based on:  Your symptoms.  Your medical and mental health history. You may be asked questions about your lifestyle, including any drug and alcohol use.  A physical exam.  Blood tests to rule out other conditions. MDD is confirmed if you have the following symptoms most of the day, nearly every day, in a 2-week period:  Either a depressed mood or loss of interest.  At least four other MDD symptoms. How is this treated? This condition is usually treated by mental health professionals, such as psychologists, psychiatrists, and clinical social workers. You may need more than one type of treatment. Treatment may include:  Psychotherapy, also called talk therapy or counseling. Types of psychotherapy include: ? Cognitive behavioral therapy (CBT). This teaches you to recognize unhealthy feelings, thoughts, and behaviors, and replace them with positive thoughts and actions. ? Interpersonal therapy (IPT). This helps you to improve the way you communicate with others or relate to them. ? Family therapy. This treatment includes members of your family.  Medicines to treat anxiety and depression. These medicines help to balance the brain chemicals   that affect your emotions.  Lifestyle  changes. You may be asked to: ? Limit alcohol use and avoid drug use. ? Get regular exercise. ? Get plenty of sleep. ? Make healthy eating choices. ? Spend more time outdoors.  Brain stimulation. This may be done if symptoms are very severe and other treatments have not worked. Examples of this treatment are electroconvulsive therapy and transcranial magnetic stimulation. Follow these instructions at home: Activity  Exercise regularly and spend time outdoors.  Find activities that you enjoy doing, and make time to do them.  Find healthy ways to manage stress, such as: ? Meditation or deep breathing. ? Spending time in nature. ? Journaling.  Return to your normal activities as told by your health care provider. Ask your health care provider what activities are safe for you. Alcohol and drug use  If you drink alcohol: ? Limit how much you use to:  0-1 drink a day for women who are not pregnant.  0-2 drinks a day for men. ? Be aware of how much alcohol is in your drink. In the [place] one drink equals one 12 oz bottle of beer (355 mL), one 5 oz glass of wine (148 mL), or one 1 oz glass of hard liquor (44 mL). ? Discuss your alcohol use with your health care provider. Alcohol can affect any antidepressant medicines you are taking.  Discuss any drug use with your health care provider. General instructions  Take over-the-counter and prescription medicines only as told by your health care provider.  Eat a healthy diet and get plenty of sleep.  Consider joining a support group. Your health care provider may be able to recommend one.  Keep all follow-up visits as told by your health care provider. This is important.   Where to find more information  National Alliance on Mental Illness: www.nami.org  [place] National Institute of Mental Health: www.nimh.nih.gov Contact a health care provider if:  Your symptoms get worse.  You develop new symptoms. Get help right away if:  You  self-harm.  You have serious thoughts about hurting yourself or others.  You hallucinate. If you ever feel like you may hurt yourself or others, or have thoughts about taking your own life, get help right away. Go to your nearest emergency department or:  Call your local emergency services (911 in the [place]  Call a suicide crisis helpline, such as the National Suicide Prevention Lifeline at 1-800-273-8255. This is open 24 hours a day in the [place]  Text the Crisis Text Line at 741741 (in the [place] Summary  Major depressive disorder (MDD) is a mental health condition. MDD causes symptoms of sadness, hopelessness, and loss of interest in things. These symptoms last most of the day, almost every day, for 2 weeks.  The symptoms of MDD can interfere with relationships and with everyday activities.  Treatments and support are available for people who develop MDD. You may need more than one type of treatment.  Get help right away if you have serious thoughts about hurting yourself or others. This information is not intended to replace advice given to you by your health care provider. Make sure you discuss any questions you have with your health care provider. Document Revised: 05/27/2019 Document Reviewed: 05/27/2019 Elsevier Patient Education  2021 Elsevier Inc.  

## 2020-08-12 NOTE — Assessment & Plan Note (Signed)
New since covid Start zoloft and f/u 1 month or sooner prn  Will consider counselor as well

## 2020-08-29 ENCOUNTER — Telehealth: Payer: Self-pay

## 2020-08-29 NOTE — Chronic Care Management (AMB) (Signed)
  Chronic Care Management   Note  08/29/2020 Name: Anthony King MRN: [id number] DOB: 1951-02-27  [name] is a 70 y.o. year old male who is a primary care patient of Ann Held, DO. I reached out to [name] by phone today in response to a referral sent by Mr. Mak Bonny Kunde's PCP, Carollee Herter, Alferd Apa, DO     Mr. Vanblarcom was given information about Chronic Care Management services today including:  1. CCM service includes personalized support from designated clinical staff supervised by his physician, including individualized plan of care and coordination with other care providers 2. 24/7 contact phone numbers for assistance for urgent and routine care needs. 3. Service will only be billed when office clinical staff spend 20 minutes or more in a month to coordinate care. 4. Only one practitioner may furnish and bill the service in a calendar month. 5. The patient may stop CCM services at any time (effective at the end of the month) by phone call to the office staff. 6. The patient will be responsible for cost sharing (co-pay) of up to 20% of the service fee (after annual deductible is met).  Patient agreed to services and verbal consent obtained.   Follow up plan: Telephone appointment with care management team member scheduled for:08/30/2020  Noreene Larsson, [place], [place], [place] [address] Direct Dial: [phone] [email or web address]

## 2020-08-30 ENCOUNTER — Ambulatory Visit (INDEPENDENT_AMBULATORY_CARE_PROVIDER_SITE_OTHER): Payer: Medicare Other

## 2020-08-30 ENCOUNTER — Other Ambulatory Visit: Payer: Self-pay

## 2020-08-30 DIAGNOSIS — R0609 Other forms of dyspnea: Secondary | ICD-10-CM

## 2020-08-30 DIAGNOSIS — U099 Post covid-19 condition, unspecified: Secondary | ICD-10-CM

## 2020-08-30 DIAGNOSIS — R5383 Other fatigue: Secondary | ICD-10-CM

## 2020-08-30 DIAGNOSIS — F321 Major depressive disorder, single episode, moderate: Secondary | ICD-10-CM | POA: Diagnosis not present

## 2020-08-30 NOTE — Patient Instructions (Addendum)
Visit Information  Shortness of Breath, Adult Shortness of breath means you have trouble breathing. Shortness of breath could be a sign of a medical problem. Follow these instructions at home:  Watch for any changes in your symptoms.  Do not use any products that contain nicotine or tobacco, such as cigarettes, e-cigarettes, and chewing tobacco.  Do not smoke. Smoking can cause shortness of breath. If you need help to quit smoking, ask your doctor.  Avoid things that can make it harder to breathe, such as: ? Mold. ? Dust. ? Air pollution. ? Chemical smells. ? Things that can cause allergy symptoms (allergens), if you have allergies.  Keep your living space clean. Use products that help remove mold and dust.  Rest as needed. Slowly return to your normal activities.  Take over-the-counter and prescription medicines only as told by your doctor. This includes oxygen therapy and inhaled medicines.  Keep all follow-up visits as told by your doctor. This is important.   Contact a doctor if:  Your condition does not get better as soon as expected.  You have a hard time doing your normal activities, even after you rest.  You have new symptoms. Get help right away if:  Your shortness of breath gets worse.  You have trouble breathing when you are resting.  You feel light-headed or you pass out (faint).  You have a cough that is not helped by medicines.  You cough up blood.  You have pain with breathing.  You have pain in your chest, arms, shoulders, or belly (abdomen).  You have a fever.  You cannot walk up stairs.  You cannot exercise the way you normally do. These symptoms may represent a serious problem that is an emergency. Do not wait to see if the symptoms will go away. Get medical help right away. Call your local emergency services (911 in the [place] Do not drive yourself to the hospital. Summary  Shortness of breath is when you have trouble breathing enough air.  It can be a sign of a medical problem.  Avoid things that make it hard for you to breathe, such as smoking, pollution, mold, and dust.  Watch for any changes in your symptoms. Contact your doctor if you do not get better or you get worse. This information is not intended to replace advice given to you by your health care provider. Make sure you discuss any questions you have with your health care provider. Document Revised: 11/15/2017 Document Reviewed: 11/15/2017 Elsevier Patient Education  2021 [employer].   PATIENT GOALS:  Goals Addressed            This Visit's Progress   . Manage Shortness of breath       Timeframe:  Long-Range Goal Priority:  High Start Date:  08/30/20                           Expected End Date:  11/30/20  - Patient will attend all scheduled provider appointments -Patient will continue to perform ADL's independently -Patient will continue to perform IADL's independently -Patient will call provider office for new concerns or questions -Remember to conserve energy by taking rest breaks during work                        . Work with my doctor to manage my symptoms of depression       Timeframe:  Long-Range Goal Priority:  Medium  Start Date:   08/30/20                          Expected End Date:  10/30/20                     Follow Up Date 09/27/20     -Talk with your doctor about your medications. Discuss current dose of Zoloft -Contact your RN Case Manager if you would like a referral to pharmacist regarding medication assistance.   Why is this important?    Keeping track of your progress will help your treatment team find the right mix of medicine and therapy for you.   Write in your journal every day.   Day-to-day changes in depression symptoms are normal. It may be more helpful to check your progress at the end of each week instead of every day.     Notes:        Consent to CCM Services: Anthony King was given information about Chronic Care  Management services today including:  1. CCM service includes personalized support from designated clinical staff supervised by his physician, including individualized plan of care and coordination with other care providers 2. 24/7 contact phone numbers for assistance for urgent and routine care needs. 3. Service will only be billed when office clinical staff spend 20 minutes or more in a month to coordinate care. 4. Only one practitioner may furnish and bill the service in a calendar month. 5. The patient may stop CCM services at any time (effective at the end of the month) by phone call to the office staff. 6. The patient will be responsible for cost sharing (co-pay) of up to 20% of the service fee (after annual deductible is met).  Patient agreed to services and verbal consent obtained.   Patient verbalizes understanding of instructions provided today and agrees to view in [place].   Telephone follow up appointment with care management team member scheduled for: 09/27/20 at [place], RN, MSN, BSN, CCM Care Management Coordinator [facility] [phone]   CLINICAL CARE PLAN: Patient Care Plan: Depression (post Covid)    Problem Identified: Depression in Adult 10 months post Covid   Priority: Medium    Long-Range Goal: Developement of Plan for management for ongoing depressive symptoms   Start Date: 08/30/2020  This Visit's Progress: On track  Priority: Medium  Note:   Current Barriers:   Ineffective Self Health Maintenance-patient continues to reports symptoms of depression related to post Covid symptoms(shortness of breath and fatigue), limiting desired and usual physical activity.  Unable to independently navigate management of post Covid depression and fatigue  Does not adhere to prescribed medication regimen Patient has decreased anti-depressant to 1/2 prescribed dose due to full dose made him feel bad. In addition, did not fill Budesonide due to  cost. Clinical Goal(s):  Marland Kitchen Collaboration with [place], DO regarding development and update of comprehensive plan of care as evidenced by provider attestation and co-signature . Inter-disciplinary care team collaboration (see longitudinal plan of care)  Patient will work with care management team to address care coordination and chronic disease management needs related to Disease Management  Educational Needs  Care Coordination  Medication Assistance    Interventions:   Evaluation of current treatment plan related to post Covid depression self-management and patient's adherence to plan as established by provider.  Collaboration with Carollee Herter, Alferd Apa, DO regarding development and update  of comprehensive plan of care as evidenced by provider attestation       and co-signature  Inter-disciplinary care team collaboration (see longitudinal plan of care)  Discussed plans with patient for ongoing care management follow up and provided patient with direct contact information for care management team Self Care Activities:  . Patient will self administer medications as prescribed . Patient will attend all scheduled provider appointments . Patient will call pharmacy for medication refills . Patient will continue to perform ADL's independently . Patient will continue to perform IADL's independently . Patient will call provider office for new concerns or questions Patient Goals: -Talk with your doctor about your medications. Discuss current dose of Zoloft -Contact your RN Case Manager if you would like a referral to pharmacist regarding medication assistance.  Follow Up Plan: Telephone follow up appointment with care management team member scheduled for: September 27, 2020.   Patient Care Plan: Post Covid 19 [place]    Problem Identified: chronic dyspnea post Covid pneumonia   Priority: High  Note:     Long-Range Goal: Long term plan for managment of shortness of breath    Start Date: 08/30/2020  Expected End Date: 11/28/2020  This Visit's Progress: On track  Priority: High  Note:   Current Barriers:  Marland Kitchen Knowledge Deficits related to COVID-19 and impact on patient self health management . Does not adhere to prescribed medication regimen Clinical Goal(s):  . patient will verbalize basic understanding of COVID-19 impact on individual health and self health management as evidenced by verbalization of basic understanding of COVID-19 as a viral disease, measures to prevent exposure, signs and symptoms, when to contact provider Interventions: . Collaboration with Todd [place], DO regarding development and update of comprehensive plan of care as evidenced by provider attestation and co-signature . Inter-disciplinary care team collaboration (see longitudinal plan of care) . Provided education to patient to enhance basic understanding of COVID-19 as a viral disease, measures to prevent exposure, signs and symptoms, recommended vaccine schedule, when to contact provider . Provided assistance to patient as requested to obtain appointment for COVID vaccine . Advised patient to Discuss medication with primary care at upcoming appointment to see if this would still appropriate, discuss possible follow up with pulmonologist. . Provided patient with educational materials related to [facility] management. . Reviewed scheduled/upcoming provider appointments including: PCP appointment and follow up call from Nurse Case Manager. . Discussed plans with patient for ongoing care management follow up and provided patient with direct contact information for care management team Patient Goals/Self-Care Activities - Patient will attend all scheduled provider appointments -Patient will continue to perform ADL's independently -Patient will continue to perform IADL's independently -Patient will call provider office for new concerns or questions -Remember to conserve energy by taking rest  breaks during work Follow Up Plan: Telephone follow up appointment with care management team member scheduled for: September 28, 2019 at 9:00 am.

## 2020-08-30 NOTE — Chronic Care Management (AMB) (Signed)
Chronic Care Management   CCM RN Visit Note  08/30/2020 Name: Anthony King MRN: [id number] DOB: 1950/07/17  Subjective: Anthony King is a 70 y.o. year old male who is a primary care patient of Ann Held, DO. The care management team was consulted for assistance with disease management and care coordination needs.    Engaged with patient by telephone for initial visit in response to provider referral for case management and/or care coordination services.   Consent to Services:  The patient was given the following information about Chronic Care Management services today, agreed to services, and gave verbal consent: 1. CCM service includes personalized support from designated clinical staff supervised by the primary care provider, including individualized plan of care and coordination with other care providers 2. 24/7 contact phone numbers for assistance for urgent and routine care needs. 3. Service will only be billed when office clinical staff spend 20 minutes or more in a month to coordinate care. 4. Only one practitioner may furnish and bill the service in a calendar month. 5.The patient may stop CCM services at any time (effective at the end of the month) by phone call to the office staff. 6. The patient will be responsible for cost sharing (co-pay) of up to 20% of the service fee (after annual deductible is met). Patient agreed to services and consent obtained.  Patient agreed to services and verbal consent obtained.   Assessment: Review of patient past medical history, allergies, medications, health status, including review of consultants reports, laboratory and other test data, was performed as part of comprehensive evaluation and provision of chronic care management services.   SDOH (Social Determinants of Health) assessments and interventions performed:  SDOH Interventions   Flowsheet Row Most Recent Value  SDOH Interventions   Food Insecurity Interventions  Intervention Not Indicated  Transportation Interventions Intervention Not Indicated       CCM Care Plan  Allergies  Allergen Reactions  . Aspirin Other (See Comments)    REACTION: nauseated, drooling    Outpatient Encounter Medications as of 08/30/2020  Medication Sig Note  . albuterol (VENTOLIN HFA) 108 (90 Base) MCG/ACT inhaler Inhale 2 puffs into the lungs every 6 (six) hours as needed for wheezing or shortness of breath.   . lansoprazole (PREVACID) 30 MG capsule Take 1 capsule (30 mg total) by mouth daily at 12 noon.   . Psyllium (METAMUCIL FREE & NATURAL) 43 % POWD Take 2 mLs by mouth daily. Metamucil with coffee   . sertraline (ZOLOFT) 50 MG tablet Take 1 tablet (50 mg total) by mouth daily. 08/30/2020: Reports takes 25 mg every day.  . vitamin B-12 (CYANOCOBALAMIN) 1000 MCG tablet Take 1,000 mcg by mouth daily.    . budesonide-formoterol (SYMBICORT) 80-4.5 MCG/ACT inhaler 2 puffs first thing in am  And 12 hours later take the 2nd two puffs if needed (Patient not taking: Reported on 08/30/2020)    No facility-administered encounter medications on file as of 08/30/2020.    Patient Active Problem List   Diagnosis Date Noted  . Post-COVID chronic dyspnea 08/12/2020  . Other fatigue 08/12/2020  . Depression, major, single episode, moderate ([place]) 08/12/2020  . DOE (dyspnea on exertion) 12/07/2019  . Pneumonia due to COVID-19 virus 10/18/2019  . Hypoxia 10/18/2019  . Left lower quadrant abdominal pain 05/31/2018  . CRF (chronic renal failure), stage 3 (moderate) ([place]) 04/18/2018  . CAD (coronary artery disease), native coronary artery 03/19/2018  . Aortic atherosclerosis ([place]) 03/19/2018  . Prediabetes 03/19/2018  .  Unstable angina ([place]) 03/19/2018  . Chest pain 03/18/2018  . CKD (chronic kidney disease), stage III 03/18/2018  . Hyperlipidemia 10/04/2017  . Left flank mass, 3 cm subfascial 08/21/2013  . Mass of chest wall, right, 1 cm, subcutaneous. 08/21/2013  . Esophageal reflux  10/28/2011  . Personal history of colonic polyps 10/28/2011    Conditions to be addressed/monitored:Depression and post Covid symptoms  Care Plan : Depression (post Covid)  Updates made by Luretha Rued, RN since 08/30/2020 12:00 AM    Problem: Depression in Adult 10 months post Covid   Priority: Medium    Long-Range Goal: Developement of Plan for management for ongoing depressive symptoms   Start Date: 08/30/2020  This Visit's Progress: On track  Priority: Medium  Note:   Current Barriers:   Ineffective Self Health Maintenance-patient continues to reports symptoms of depression related to post Covid symptoms(shortness of breath and fatigue), limiting desired and usual physical activity.  Unable to independently navigate management of post Covid depression and fatigue  Does not adhere to prescribed medication regimen Patient has decreased anti-depressant to 1/2 prescribed dose due to full dose made him feel bad. In addition, did not fill Budesonide due to cost. Clinical Goal(s):  Marland Kitchen Collaboration with [place], DO regarding development and update of comprehensive plan of care as evidenced by provider attestation and co-signature . Inter-disciplinary care team collaboration (see longitudinal plan of care)  Patient will work with care management team to address care coordination and chronic disease management needs related to Disease Management  Educational Needs  Care Coordination  Medication Assistance    Interventions:   Evaluation of current treatment plan related to post Covid depression self-management and patient's adherence to plan as established by provider.  Collaboration with Carollee Herter, Alferd Apa, DO regarding development and update of comprehensive plan of care as evidenced by provider attestation       and co-signature  Inter-disciplinary care team collaboration (see longitudinal plan of care)  Discussed plans with patient for ongoing care management  follow up and provided patient with direct contact information for care management team Self Care Activities:  . Patient will self administer medications as prescribed . Patient will attend all scheduled provider appointments . Patient will call pharmacy for medication refills . Patient will continue to perform ADL's independently . Patient will continue to perform IADL's independently . Patient will call provider office for new concerns or questions Patient Goals: -Talk with your doctor about your medications. Discuss current dose of Zoloft -Contact your RN Case Manager if you would like a referral to pharmacist regarding medication assistance.  Follow Up Plan: Telephone follow up appointment with care management team member scheduled for: September 27, 2020.   Care Plan : Post Covid 19 [place]  Updates made by Luretha Rued, RN since 08/30/2020 12:00 AM    Problem: chronic dyspnea post Covid pneumonia   Priority: High  Note:     Long-Range Goal: Long term plan for managment of shortness of breath   Start Date: 08/30/2020  Expected End Date: 11/28/2020  This Visit's Progress: On track  Priority: High  Note:   Current Barriers:  Marland Kitchen Knowledge Deficits related to COVID-19 and impact on patient self health management . Does not adhere to prescribed medication regimen Clinical Goal(s):  . patient will verbalize basic understanding of COVID-19 impact on individual health and self health management as evidenced by verbalization of basic understanding of COVID-19 as a viral disease, measures to prevent exposure,  signs and symptoms, when to contact provider Interventions: . Collaboration with [place], DO regarding development and update of comprehensive plan of care as evidenced by provider attestation and co-signature . Inter-disciplinary care team collaboration (see longitudinal plan of care) . Provided education to patient to enhance basic understanding of COVID-19 as a viral  disease, measures to prevent exposure, signs and symptoms, recommended vaccine schedule, when to contact provider . Provided assistance to patient as requested to obtain appointment for COVID vaccine . Advised patient to Discuss medication with primary care at upcoming appointment to see if this would still appropriate, discuss possible follow up with pulmonologist. . Provided patient with educational materials related to [facility] management. . Reviewed scheduled/upcoming provider appointments including: PCP appointment and follow up call from Nurse Case Manager. . Discussed plans with patient for ongoing care management follow up and provided patient with direct contact information for care management team Patient Goals/Self-Care Activities - Patient will attend all scheduled provider appointments -Patient will continue to perform ADL's independently -Patient will continue to perform IADL's independently -Patient will call provider office for new concerns or questions -Remember to conserve energy by taking rest breaks during work Follow Up Plan: Telephone follow up appointment with care management team member scheduled for: September 28, 2019 at 9:00 am.     Plan:Telephone follow up appointment with care management team member scheduled for:  09/27/20 at [place], RN, MSN, BSN, CCM Care Management Coordinator [facility] [phone]

## 2020-09-10 ENCOUNTER — Encounter: Payer: Self-pay | Admitting: Family Medicine

## 2020-09-10 ENCOUNTER — Ambulatory Visit (INDEPENDENT_AMBULATORY_CARE_PROVIDER_SITE_OTHER): Payer: Medicare Other | Admitting: Family Medicine

## 2020-09-10 ENCOUNTER — Other Ambulatory Visit: Payer: Self-pay

## 2020-09-10 VITALS — BP 139/78 | HR 75 | Temp 97.7°F | Ht 69.25 in | Wt 224.0 lb

## 2020-09-10 DIAGNOSIS — E538 Deficiency of other specified B group vitamins: Secondary | ICD-10-CM | POA: Diagnosis not present

## 2020-09-10 DIAGNOSIS — R269 Unspecified abnormalities of gait and mobility: Secondary | ICD-10-CM

## 2020-09-10 DIAGNOSIS — F321 Major depressive disorder, single episode, moderate: Secondary | ICD-10-CM

## 2020-09-10 DIAGNOSIS — M791 Myalgia, unspecified site: Secondary | ICD-10-CM | POA: Diagnosis not present

## 2020-09-10 DIAGNOSIS — R29898 Other symptoms and signs involving the musculoskeletal system: Secondary | ICD-10-CM | POA: Diagnosis not present

## 2020-09-10 DIAGNOSIS — R350 Frequency of micturition: Secondary | ICD-10-CM | POA: Diagnosis not present

## 2020-09-10 DIAGNOSIS — U099 Post covid-19 condition, unspecified: Secondary | ICD-10-CM

## 2020-09-10 DIAGNOSIS — R42 Dizziness and giddiness: Secondary | ICD-10-CM | POA: Diagnosis not present

## 2020-09-10 DIAGNOSIS — F419 Anxiety disorder, unspecified: Secondary | ICD-10-CM

## 2020-09-10 DIAGNOSIS — Z7409 Other reduced mobility: Secondary | ICD-10-CM

## 2020-09-10 LAB — POC URINALSYSI DIPSTICK (AUTOMATED)
Bilirubin, UA: NEGATIVE
Blood, UA: NEGATIVE
Glucose, UA: NEGATIVE
Ketones, UA: NEGATIVE
Leukocytes, UA: NEGATIVE
Nitrite, UA: NEGATIVE
Protein, UA: POSITIVE — AB
Spec Grav, UA: 1.025 (ref 1.010–1.025)
Urobilinogen, UA: 0.2 E.U./dL
pH, UA: 5.5 (ref 5.0–8.0)

## 2020-09-10 LAB — VITAMIN B12: Vitamin B-12: 951 pg/mL — ABNORMAL HIGH (ref 211–911)

## 2020-09-10 LAB — CBC WITH DIFFERENTIAL/PLATELET
Basophils Absolute: 0 10*3/uL (ref 0.0–0.1)
Basophils Relative: 0.7 % (ref 0.0–3.0)
Eosinophils Absolute: 0.1 10*3/uL (ref 0.0–0.7)
Eosinophils Relative: 1.5 % (ref 0.0–5.0)
HCT: 43.9 % (ref 39.0–52.0)
Hemoglobin: 14.4 g/dL (ref 13.0–17.0)
Lymphocytes Relative: 26.3 % (ref 12.0–46.0)
Lymphs Abs: 1.8 10*3/uL (ref 0.7–4.0)
MCHC: 32.9 g/dL (ref 30.0–36.0)
MCV: 77.3 fl — ABNORMAL LOW (ref 78.0–100.0)
Monocytes Absolute: 0.8 10*3/uL (ref 0.1–1.0)
Monocytes Relative: 11.6 % (ref 3.0–12.0)
Neutro Abs: 4 10*3/uL (ref 1.4–7.7)
Neutrophils Relative %: 59.9 % (ref 43.0–77.0)
Platelets: 181 10*3/uL (ref 150.0–400.0)
RBC: 5.68 Mil/uL (ref 4.22–5.81)
RDW: 14.8 % (ref 11.5–15.5)
WBC: 6.7 10*3/uL (ref 4.0–10.5)

## 2020-09-10 LAB — COMPREHENSIVE METABOLIC PANEL
ALT: 20 U/L (ref 0–53)
AST: 20 U/L (ref 0–37)
Albumin: 4 g/dL (ref 3.5–5.2)
Alkaline Phosphatase: 105 U/L (ref 39–117)
BUN: 16 mg/dL (ref 6–23)
CO2: 27 mEq/L (ref 19–32)
Calcium: 9.2 mg/dL (ref 8.4–10.5)
Chloride: 104 mEq/L (ref 96–112)
Creatinine, Ser: 1.37 mg/dL (ref 0.40–1.50)
GFR: 52.52 mL/min — ABNORMAL LOW (ref >60.00)
Glucose, Bld: 118 mg/dL — ABNORMAL HIGH (ref 70–99)
Potassium: 4 mEq/L (ref 3.5–5.1)
Sodium: 137 mEq/L (ref 135–145)
Total Bilirubin: 1.2 mg/dL (ref 0.2–1.2)
Total Protein: 6.4 g/dL (ref 6.0–8.3)

## 2020-09-10 LAB — TSH: TSH: 2.81 u[IU]/mL (ref 0.35–4.50)

## 2020-09-10 NOTE — Assessment & Plan Note (Signed)
Try to inc zoloft to 50 again---  If anxiety / side effects occur again we will stop it and try something else F/u 1 month

## 2020-09-10 NOTE — Progress Notes (Signed)
Patient ID: Anthony King, male    DOB: [birth year]  Age: 70 y.o. MRN: [id number]    Subjective:  Subjective  HPI Anthony King presents for f/u depression.  The zoloft made him anxious and shakey so he broke it in half and is doing better.  He is still crying but not as often He also c/o c/o weakness in his legs L>R   No injury-- this has been going on since he had covid.  No back pain.    Review of Systems  Constitutional: Negative for appetite change, diaphoresis, fatigue and unexpected weight change.  Eyes: Negative for pain, redness and visual disturbance.  Respiratory: Negative for cough, chest tightness, shortness of breath and wheezing.   Cardiovascular: Negative for chest pain, palpitations and leg swelling.  Endocrine: Negative for cold intolerance, heat intolerance, polydipsia, polyphagia and polyuria.  Genitourinary: Negative for difficulty urinating, dysuria and frequency.  Musculoskeletal: Positive for gait problem.  Neurological: Positive for weakness. Negative for dizziness, light-headedness, numbness and headaches.  Psychiatric/Behavioral: The patient is nervous/anxious.     History Past Medical History:  Diagnosis Date  . Aortic atherosclerosis ([place]) 03/19/2018  . Arthritis 07-17-11   hands, hips, knees  . Basal cell carcinoma    nose  . CAD (coronary artery disease), native coronary artery 03/19/2018  . Chronic kidney disease 07-17-11   past kidney stone x1  . CKD (chronic kidney disease), stage III ([place]) 03/18/2018  . Colon polyps    ADENOMATOUS AND HYPERPLASTIC POLYPS  . COVID-19   . Diverticulosis   . DOE (dyspnea on exertion) 12/07/2019   Onset with covid 19 pna  - see adimit 10/18/19  -  12/07/2019   Walked RA  3 laps @ approx 211ft each @ slow slt unsteady  pace  stopped due to sob / weakness but sats never less than 92%   . Dyspnea    with exertion  . Gastric ulcer   . GERD (gastroesophageal reflux disease)   . Hemorrhoids   . History of kidney  stones   . Hyperlipidemia   . Hypoxia 10/18/2019  . Left lower quadrant abdominal pain 05/31/2018  . Pneumonia   . Pre-diabetes   . Prediabetes 03/19/2018  . Unstable angina ([place]) 03/19/2018  . Wears glasses     He has a past surgical history that includes Appendectomy; fatty tumors (07-17-11); Inguinal hernia repair (07/23/2011); Colonoscopy; Excision basal cell carcinoma; Mass excision (N/A, 08/29/2013); LEFT HEART CATH AND CORONARY ANGIOGRAPHY (N/A, 03/21/2018); Hemorrhoid banding; Inguinal hernia repair (Bilateral, 04/19/2019); Polypectomy; Tonsillectomy; Cardiac catheterization; laparoscopy (N/A, 02/15/2020); Groin dissection (Right, 02/15/2020); Excision of mesh (Right, 02/15/2020); and Inguinal hernia repair (Right, 02/15/2020).   His family history includes Alzheimer's disease in his mother; Arthritis in his maternal grandmother, mother, and paternal grandmother; Breast cancer in his mother; Colon cancer in his brother and father; Pneumonia in his mother; Stomach cancer in his father and sister.He reports that he quit smoking about 29 years ago. His smoking use included cigarettes. He has never used smokeless tobacco. He reports that he does not drink alcohol and does not use drugs.  Current Outpatient Medications on File Prior to Visit  Medication Sig Dispense Refill  . albuterol (VENTOLIN HFA) 108 (90 Base) MCG/ACT inhaler Inhale 2 puffs into the lungs every 6 (six) hours as needed for wheezing or shortness of breath. 18 g 1  . budesonide-formoterol (SYMBICORT) 80-4.5 MCG/ACT inhaler 2 puffs first thing in am  And 12 hours later take the 2nd two puffs  if needed (Patient taking differently: 2 puffs first thing in am  And 12 hours later take the 2nd two puffs if needed) 1 each 12  . lansoprazole (PREVACID) 30 MG capsule Take 1 capsule (30 mg total) by mouth daily at 12 noon. 90 capsule 3  . Psyllium (METAMUCIL FREE & NATURAL) 43 % POWD Take 2 mLs by mouth daily. Metamucil with coffee    . sertraline  (ZOLOFT) 50 MG tablet Take 1 tablet (50 mg total) by mouth daily. 30 tablet 3  . vitamin B-12 (CYANOCOBALAMIN) 1000 MCG tablet Take 1,000 mcg by mouth daily.      No current facility-administered medications on file prior to visit.     Objective:  Objective  Physical Exam Vitals and nursing note reviewed.  Constitutional:      General: He is sleeping.     Appearance: He is well-developed.  HENT:     Head: Normocephalic and atraumatic.  Eyes:     Pupils: Pupils are equal, round, and reactive to light.  Neck:     Thyroid: No thyromegaly.  Cardiovascular:     Rate and Rhythm: Normal rate and regular rhythm.     Heart sounds: No murmur heard.   Pulmonary:     Effort: Pulmonary effort is normal. No respiratory distress.     Breath sounds: Normal breath sounds. No wheezing or rales.  Chest:     Chest wall: No tenderness.  Musculoskeletal:        General: No tenderness.     Cervical back: Normal range of motion and neck supple.     Right lower leg: No edema.     Left lower leg: No edema.  Skin:    General: Skin is warm and dry.  Neurological:     Mental Status: He is oriented to person, place, and time.     Motor: Weakness present.     Coordination: Coordination normal.     Gait: Gait abnormal.     Deep Tendon Reflexes: Reflexes abnormal.  Psychiatric:        Mood and Affect: Mood is anxious and depressed. Affect is tearful.        Behavior: Behavior normal.        Thought Content: Thought content normal.        Cognition and Memory: Cognition normal.        Judgment: Judgment normal.    BP 139/78 (BP Location: Right Arm, Patient Position: Sitting, Cuff Size: Large)   Pulse 75   Temp 97.7 F (36.5 C) (Oral)   Ht 5' 9.25" (1.759 m)   Wt 224 lb (101.6 kg)   SpO2 98%   BMI 32.84 kg/m  Wt Readings from Last 3 Encounters:  09/10/20 224 lb (101.6 kg)  08/12/20 225 lb 3.2 oz (102.2 kg)  07/26/20 223 lb (101.2 kg)     Lab Results  Component Value Date   WBC 6.2  08/12/2020   HGB 14.7 08/12/2020   HCT 45.1 08/12/2020   PLT 192.0 08/12/2020   GLUCOSE 92 08/12/2020   CHOL 147 03/18/2020   TRIG 88 03/18/2020   HDL 39 (L) 03/18/2020   LDLDIRECT 141.0 06/11/2016   LDLCALC 91 03/18/2020   ALT 21 08/12/2020   AST 20 08/12/2020   NA 139 08/12/2020   K 4.3 08/12/2020   CL 107 08/12/2020   CREATININE 1.34 08/12/2020   BUN 15 08/12/2020   CO2 26 08/12/2020   TSH 3.92 08/12/2020   PSA 0.68 06/11/2016  INR 1.01 03/18/2018   HGBA1C 5.7 (H) 03/18/2018    DG Chest 2 View  Result Date: 08/12/2020 CLINICAL DATA:  Post COVID, shortness of breath EXAM: CHEST - 2 VIEW COMPARISON:  01/31/2020 FINDINGS: Heart and mediastinal contours are within normal limits. No focal opacities or effusions. No acute bony abnormality. IMPRESSION: No active cardiopulmonary disease. Electronically Signed   By: Rolm Baptise M.D.   On: 08/12/2020 11:53     Assessment & Plan:  Plan  I am having Anthony King "Herbie Baltimore" maintain his vitamin B-12, Metamucil Free & Natural, lansoprazole, sertraline, albuterol, and budesonide-formoterol.  No orders of the defined types were placed in this encounter.   Problem List Items Addressed This Visit      Unprioritized   Depression, major, single episode, moderate (HCC)    Try to inc zoloft to 50 again---  If anxiety / side effects occur again we will stop it and try something else F/u 1 month      Relevant Orders   Vitamin B12   CBC with Differential/Platelet   Comprehensive metabolic panel   TSH   Post-COVID chronic decreased mobility and endurance    Pt did not want to go to PT--- he would like to do it himself at home  If he has a problem       Relevant Orders   Ambulatory referral to Neurology    Other Visit Diagnoses    Left leg weakness    -  Primary   Relevant Orders   DG Lumbar Spine Complete   Vitamin B12   CBC with Differential/Platelet   Comprehensive metabolic panel   TSH   Ambulatory referral to  Neurology   Gait abnormality       Relevant Orders   Vitamin B12   B12 deficiency       Relevant Orders   Vitamin B12   Myalgia       Dizziness       Relevant Orders   Ambulatory referral to Neurology   MR Brain Wo Contrast   Urinary frequency       Relevant Orders   POCT Urinalysis Dipstick (Automated) (Completed)   Post-COVID chronic anxiety          Follow-up: Return in about 4 weeks (around 10/08/2020), or if symptoms worsen or fail to improve, for +.  Ann Held, DO

## 2020-09-10 NOTE — Assessment & Plan Note (Signed)
Pt did not want to go to PT--- he would like to do it himself at home  If he has a problem

## 2020-09-10 NOTE — Patient Instructions (Signed)
How to Perform the Epley Maneuver The Epley maneuver is an exercise that relieves symptoms of vertigo. Vertigo is the feeling that you or your surroundings are moving when they are not. When you feel vertigo, you may feel like the room is spinning and may have trouble walking. The Epley maneuver is used for a type of vertigo caused by a calcium deposit in a part of the inner ear. The maneuver involves changing head positions to help the deposit move out of the area. You can do this maneuver at home whenever you have symptoms of vertigo. You can repeat it in 24 hours if your vertigo has not gone away. Even though the Epley maneuver may relieve your vertigo for a few weeks, it is possible that your symptoms will return. This maneuver relieves vertigo, but it does not relieve dizziness. What are the risks? If it is done correctly, the Epley maneuver is considered safe. Sometimes it can lead to dizziness or nausea that goes away after a short time. If you develop other symptoms--such as changes in vision, weakness, or numbness--stop doing the maneuver and call your health care provider. Supplies needed:  A bed or table.  A pillow. How to do the Epley maneuver 1. Sit on the edge of a bed or table with your back straight and your legs extended or hanging over the edge of the bed or table. 2. Turn your head halfway toward the affected ear or side as told by your health care provider. 3. Lie backward quickly with your head turned until you are lying flat on your back. You may want to position a pillow under your shoulders. 4. Hold this position for at least 30 seconds. If you feel dizzy or have symptoms of vertigo, continue to hold the position until the symptoms stop. 5. Turn your head to the opposite direction until your unaffected ear is facing the floor. 6. Hold this position for at least 30 seconds. If you feel dizzy or have symptoms of vertigo, continue to hold the position until the symptoms  stop. 7. Turn your whole body to the same side as your head so that you are positioned on your side. Your head will now be nearly facedown. Hold for at least 30 seconds. If you feel dizzy or have symptoms of vertigo, continue to hold the position until the symptoms stop. 8. Sit back up. You can repeat the maneuver in 24 hours if your vertigo does not go away.      Follow these instructions at home: For 24 hours after doing the Epley maneuver:  Keep your head in an upright position.  When lying down to sleep or rest, keep your head raised (elevated) with two or more pillows.  Avoid excessive neck movements. Activity  Do not drive or use machinery if you feel dizzy.  After doing the Epley maneuver, return to your normal activities as told by your health care provider. Ask your health care provider what activities are safe for you. General instructions  Drink enough fluid to keep your urine pale yellow.  Do not drink alcohol.  Take over-the-counter and prescription medicines only as told by your health care provider.  Keep all follow-up visits as told by your health care provider. This is important. Preventing vertigo symptoms Ask your health care provider if there is anything you should do at home to prevent vertigo. He or she may recommend that you:  Keep your head elevated with two or more pillows while you sleep.    Do not sleep on the side of your affected ear.  Get up slowly from bed.  Avoid sudden movements during the day.  Avoid extreme head positions or movement, such as looking up or bending over. Contact a health care provider if:  Your vertigo gets worse.  You have other symptoms, including: ? Nausea. ? Vomiting. ? Headache. Get help right away if you:  Have vision changes.  Have a headache or neck pain that is severe or getting worse.  Cannot stop vomiting.  Have new numbness or weakness in any part of your body. Summary  Vertigo is the feeling that  you or your surroundings are moving when they are not.  The Epley maneuver is an exercise that relieves symptoms of vertigo.  If the Epley maneuver is done correctly, it is considered safe and relieves vertigo quickly. This information is not intended to replace advice given to you by your health care provider. Make sure you discuss any questions you have with your health care provider. Document Revised: 04/12/2019 Document Reviewed: 04/12/2019 Elsevier Patient Education  2021 Elsevier Inc.  

## 2020-09-27 ENCOUNTER — Telehealth: Payer: Self-pay

## 2020-09-27 ENCOUNTER — Ambulatory Visit (INDEPENDENT_AMBULATORY_CARE_PROVIDER_SITE_OTHER): Payer: Medicare Other

## 2020-09-27 DIAGNOSIS — Z7409 Other reduced mobility: Secondary | ICD-10-CM

## 2020-09-27 DIAGNOSIS — F321 Major depressive disorder, single episode, moderate: Secondary | ICD-10-CM | POA: Diagnosis not present

## 2020-09-27 DIAGNOSIS — U099 Post covid-19 condition, unspecified: Secondary | ICD-10-CM

## 2020-09-27 NOTE — Chronic Care Management (AMB) (Signed)
   Chronic Care Management   Outreach Note  09/27/2020 Name: Anthony King MRN: [id number] DOB: 09/22/1950  Referred by: Ann Held, DO Reason for referral : No chief complaint on file.   Client returned call to [facility], but states this is not a good time and request to reschedule appointment for next week.    Follow Up Plan: Appointment rescheduled for next week, per client request.  Thea Silversmith, RN, MSN, BSN, CCM Care Management Coordinator [facility] [phone]

## 2020-09-27 NOTE — Telephone Encounter (Signed)
  Chronic Care Management   Outreach Note  09/27/2020 Name: Anthony King MRN: [id number] DOB: 1950-10-24  Referred by: Ann Held, DO Reason for referral : Chronic Care Management (RNCM Follow Up)   An unsuccessful telephone outreach was attempted today. The patient was referred to the case management team for assistance with care management and care coordination.   Follow Up Plan: The care management team will reach out to the patient again over the next 30 days.   Thea Silversmith, RN, MSN, BSN, CCM Care Management Coordinator [facility] [phone]

## 2020-10-01 ENCOUNTER — Ambulatory Visit (HOSPITAL_COMMUNITY)
Admission: RE | Admit: 2020-10-01 | Discharge: 2020-10-01 | Disposition: A | Discharge status: Home / Self Care | Payer: Medicare Other | Source: Ambulatory Visit | Attending: Family Medicine | Admitting: Family Medicine

## 2020-10-01 DIAGNOSIS — R42 Dizziness and giddiness: Secondary | ICD-10-CM | POA: Diagnosis not present

## 2020-10-03 ENCOUNTER — Telehealth: Payer: Medicare Other

## 2020-10-03 ENCOUNTER — Telehealth: Payer: Self-pay

## 2020-10-03 NOTE — Telephone Encounter (Signed)
  Chronic Care Management   Outreach Note  10/03/2020 Name: Anthony King MRN: [id number] DOB: Mar 22, 1951  Referred by: Ann Held, DO Reason for referral : Chronic Care Management (RNCM follow up)   An unsuccessful telephone outreach was attempted today. The patient was referred to the case management team for assistance with care management and care coordination.   Follow Up Plan: A HIPAA compliant phone message was left for the patient providing contact information and requesting a return call.  The care management team will reach out to the patient again over the next 30 days.   Thea Silversmith, RN, MSN, BSN, CCM Care Management Coordinator [facility] [phone]

## 2020-10-04 ENCOUNTER — Telehealth: Payer: Self-pay | Admitting: *Deleted

## 2020-10-04 NOTE — Chronic Care Management (AMB) (Signed)
  Care Management   Note  10/04/2020 Name: [name] MRN: [id number] DOB: Jul 26, 1950  Anthony King is a 70 y.o. year old male who is a primary care patient of Ann Held, DO and is actively engaged with the care management team. I reached out to Anthony King by phone today to assist with re-scheduling a follow up visit with the RN Case Manager  Follow up plan: Patient declines further follow up and engagement by the care management team. Appropriate care team members and provider have been notified via electronic communication.  The care management team is available to follow up with the patient after provider conversation with the patient regarding recommendation for care management engagement and subsequent re-referral to the care management team.   [facility]

## 2020-10-07 ENCOUNTER — Ambulatory Visit: Payer: Self-pay

## 2020-10-07 DIAGNOSIS — S20212A Contusion of left front wall of thorax, initial encounter: Secondary | ICD-10-CM | POA: Diagnosis not present

## 2020-10-07 DIAGNOSIS — R0609 Other forms of dyspnea: Secondary | ICD-10-CM

## 2020-10-07 DIAGNOSIS — F321 Major depressive disorder, single episode, moderate: Secondary | ICD-10-CM

## 2020-10-07 DIAGNOSIS — U099 Post covid-19 condition, unspecified: Secondary | ICD-10-CM

## 2020-10-07 DIAGNOSIS — S39012A Strain of muscle, fascia and tendon of lower back, initial encounter: Secondary | ICD-10-CM | POA: Diagnosis not present

## 2020-10-07 DIAGNOSIS — W19XXXA Unspecified fall, initial encounter: Secondary | ICD-10-CM | POA: Diagnosis not present

## 2020-10-07 DIAGNOSIS — S5001XA Contusion of right elbow, initial encounter: Secondary | ICD-10-CM | POA: Diagnosis not present

## 2020-10-07 DIAGNOSIS — M546 Pain in thoracic spine: Secondary | ICD-10-CM | POA: Diagnosis not present

## 2020-10-07 DIAGNOSIS — Z7409 Other reduced mobility: Secondary | ICD-10-CM

## 2020-10-07 NOTE — Chronic Care Management (AMB) (Signed)
  Chronic Care Management   Outreach Note  10/07/2020 Name: Anthony King MRN: [id number] DOB: 05-15-1951  Referred by: Ann Held, DO Reason for referral : Chronic Care Management (Case Closure)   Successful contact was made with the patient by the Care Guide to reschedule follow up visit with Care Management Coordinator. Patient declines engagement at this time.   Follow Up Plan: Case Closed due to client no longer wishes to participate.  Thea Silversmith, RN, MSN, BSN, CCM Care Management Coordinator [facility] [phone]

## 2020-10-07 NOTE — Patient Instructions (Signed)
Visit Information  Thank you for allowing me to share the care management and care coordination services that are available to you as part of your health plan and services through your primary care provider and medical home. Please reach out to me at [phone] if the care management/care coordination team may be of assistance to you in the future.   Franky Reier, RN, MSN, BSN, CCM Care Management Coordinator [facility] [phone]  

## 2020-10-08 ENCOUNTER — Ambulatory Visit: Payer: Medicare Other | Admitting: Family Medicine

## 2020-10-19 DIAGNOSIS — R509 Fever, unspecified: Secondary | ICD-10-CM | POA: Diagnosis not present

## 2020-10-19 DIAGNOSIS — I10 Essential (primary) hypertension: Secondary | ICD-10-CM | POA: Diagnosis not present

## 2020-10-19 DIAGNOSIS — R55 Syncope and collapse: Secondary | ICD-10-CM | POA: Diagnosis not present

## 2020-10-21 ENCOUNTER — Telehealth: Payer: Self-pay | Admitting: Gastroenterology

## 2020-10-21 NOTE — Telephone Encounter (Signed)
Inbound call from patient stating he is having issues with his hemorrhoids and is requesting to schedule another banding.  Please advise.

## 2020-10-21 NOTE — Telephone Encounter (Signed)
Yes I'm happy to see him to discuss options if you can put in the first acute visit, can be office visit or banding slot. He can try some Calmol suppositories PRN OTC in the interim to help with the inflammation.

## 2020-10-21 NOTE — Telephone Encounter (Signed)
Lm on home vm for patient to return call.  

## 2020-10-21 NOTE — Telephone Encounter (Signed)
Spoke with patient, he states that his hemorrhoids are "awful", using preparation H suppositories with steroid cream on it and it is not helping. He states that he is having a lot of pain and would like to set up 2nd banding. Please advise, thanks

## 2020-10-22 NOTE — Telephone Encounter (Signed)
Spoke with patient in regards to recommendations. Patient will use Calmol suppositories as needed until his appt. He is scheduled for a follow up on Tuesday, 11/05/20 at 8:30 AM. Advised patient to call back periodically to see if we have had any cancellations. Patient verbalized understanding of all information.

## 2020-11-05 ENCOUNTER — Ambulatory Visit (INDEPENDENT_AMBULATORY_CARE_PROVIDER_SITE_OTHER): Payer: Medicare Other | Admitting: Gastroenterology

## 2020-11-05 VITALS — BP 138/68 | HR 74 | Ht 70.0 in | Wt 221.8 lb

## 2020-11-05 DIAGNOSIS — K642 Third degree hemorrhoids: Secondary | ICD-10-CM

## 2020-11-05 DIAGNOSIS — K625 Hemorrhage of anus and rectum: Secondary | ICD-10-CM

## 2020-11-05 NOTE — Patient Instructions (Addendum)
If you are age 70 or older, your body mass index should be between 23-30. Your Body mass index is 31.82 kg/m. If this is out of the aforementioned range listed, please consider follow up with your Primary Care Provider.  If you are age 29 or younger, your body mass index should be between 19-25. Your Body mass index is 31.82 kg/m. If this is out of the aformentioned range listed, please consider follow up with your Primary Care Provider.   HEMORRHOID BANDING PROCEDURE    FOLLOW-UP CARE   1. The procedure you have had should have been relatively painless since the banding of the area involved does not have nerve endings and there is no pain sensation.  The rubber band cuts off the blood supply to the hemorrhoid and the band may fall off as soon as 48 hours after the banding (the band may occasionally be seen in the toilet bowl following a bowel movement). You may notice a temporary feeling of fullness in the rectum which should respond adequately to plain Tylenol or Motrin.  2. Following the banding, avoid strenuous exercise that evening and resume full activity the next day.  A sitz bath (soaking in a warm tub) or bidet is soothing, and can be useful for cleansing the area after bowel movements.     3. To avoid constipation, take two tablespoons of natural wheat bran, natural oat bran, flax, Benefiber or any over the counter fiber supplement and increase your water intake to 7-8 glasses daily.    4. Unless you have been prescribed anorectal medication, do not put anything inside your rectum for two weeks: No suppositories, enemas, fingers, etc.  5. Occasionally, you may have more bleeding than usual after the banding procedure.  This is often from the untreated hemorrhoids rather than the treated one.  Don't be concerned if there is a tablespoon or so of blood.  If there is more blood than this, lie flat with your bottom higher than your head and apply an ice pack to the area. If the bleeding  does not stop within a half an hour or if you feel faint, call our office at [phone] or go to the emergency room.  6. Problems are not common; however, if there is a substantial amount of bleeding, severe pain, chills, fever or difficulty passing urine (very rare) or other problems, you should call us at [phone] or report to the nearest emergency room.  7. Do not stay seated continuously for more than 2-3 hours for a day or two after the procedure.  Tighten your buttock muscles 10-15 times every two hours and take 10-15 deep breaths every 1-2 hours.  Do not spend more than a few minutes on the toilet if you cannot empty your bowel; instead re-visit the toilet at a later time.     Thank you for entrusting me with your care and for choosing [facility], Dr. Emporia Cellar

## 2020-11-05 NOTE — Progress Notes (Signed)
HPI :  70 year old male with a history of colon adenomas and hemorrhoids, here for follow-up visit for hemorrhoids.  He previously underwent hemorrhoid banding late 2019-early 2000, completed series for symptoms of prolapse and bleeding.  He states he initially did quite well with the banding and symptoms are well controlled.  I saw him in January with some relapse of bleeding and prolapse.  He had inflamed RA and RP areas.  We banded the RA area again and he had relief of symptoms and resolved things for a few months.  About a month ago he had recurrence of symptoms again.  Has been having some low-grade bleeding with most bowel movements.  He has also has grade 3 prolapse on the right side.  Denies straining.  Denies constipation.  Taking Metamucil once daily.  His last colonoscopy was March 2021, 10 small adenomas removed with internal hemorrhoids and stigmata of prior banding.  He is due for surveillance colonoscopy in March 2024.  He is hoping to avoid hemorrhoid surgery if at all possible is interested in further banding if he is a candidate.   Colonoscopy 08/17/2016 - 11 small polyps removed, diverticulosis, hemorrhoids - 6 polyps c/w adenoma - repeat colonoscopy in 2021  Banded hemorrhoids x 3 and finished protocol 07/29/18  Colonoscopy 09/05/19 - The perianal and digital rectal examinations were normal, hemorrhoids noted. - Four sessile polyps were found in the ascending colon. The polyps were 3 to 4 mm in size. These polyps were removed with a cold snare. Resection and retrieval were complete. - Two sessile polyps were found in the hepatic flexure. The polyps were 3 to 6 mm in size. These polyps were removed with a cold snare. Resection and retrieval were complete. - Four sessile polyps were found in the transverse colon. The polyps were 3 to 4 mm in size. These polyps were removed with a cold snare. Resection and retrieval were complete. - Multiple medium-mouthed diverticula were  found in the transverse colon, cecum and left colon. - Internal hemorrhoids were found during retroflexion with stigmata of prior banding. - The exam was otherwise without abnormality.  Banded RA hemorrhoid Jan 2022    Past Medical History:  Diagnosis Date  . Aortic atherosclerosis ([place]) 03/19/2018  . Arthritis 07-17-11   hands, hips, knees  . Basal cell carcinoma    nose  . CAD (coronary artery disease), native coronary artery 03/19/2018  . Chronic kidney disease 07-17-11   past kidney stone x1  . CKD (chronic kidney disease), stage III ([place]) 03/18/2018  . Colon polyps    ADENOMATOUS AND HYPERPLASTIC POLYPS  . COVID-19   . Diverticulosis   . DOE (dyspnea on exertion) 12/07/2019   Onset with covid 19 pna  - see adimit 10/18/19  -  12/07/2019   Walked RA  3 laps @ approx 269ft each @ slow slt unsteady  pace  stopped due to sob / weakness but sats never less than 92%   . Dyspnea    with exertion  . Gastric ulcer   . GERD (gastroesophageal reflux disease)   . Hemorrhoids   . History of kidney stones   . Hyperlipidemia   . Hypoxia 10/18/2019  . Left lower quadrant abdominal pain 05/31/2018  . Pneumonia   . Pre-diabetes   . Prediabetes 03/19/2018  . Unstable angina ([place]) 03/19/2018  . Wears glasses      Past Surgical History:  Procedure Laterality Date  . APPENDECTOMY    . BASAL CELL CARCINOMA EXCISION  nose  . CARDIAC CATHETERIZATION    . COLONOSCOPY    . EXCISION OF MESH Right 02/15/2020   Procedure: EXPLANTATION OF MESH;  Surgeon: Erroll Luna, MD;  Location: [place];  Service: General;  Laterality: Right;  . fatty tumors  07-17-11   excised x4- 1 -neck, 1 arm, 2 chest  . GROIN DISSECTION Right 02/15/2020   Procedure: RIGHT GROIN NEURECTOMY;  Surgeon: Erroll Luna, MD;  Location: [place];  Service: General;  Laterality: Right;  DR FAROOQUI APPROVED CORNETT USING HIS TIME PER ABBIE  . HEMORRHOID BANDING    . INGUINAL HERNIA REPAIR  07/23/2011   Procedure: LAPAROSCOPIC  INGUINAL HERNIA;  Surgeon: Stark Klein, MD;  [facility];  Service: General;  Laterality: Left;  . INGUINAL HERNIA REPAIR Bilateral 04/19/2019   Procedure: BILATERAL OPEN INGUINAL HERNIA REPAIR WITH MESH;  Surgeon: Erroll Luna, MD;  Location: [place];  Service: General;  Laterality: Bilateral;  . INGUINAL HERNIA REPAIR Right 02/15/2020   Procedure: RIGHT HERNIA REPAIR INGUINAL WITH MESH;  Surgeon: Erroll Luna, MD;  Location: [place];  Service: General;  Laterality: Right;  . LAPAROSCOPY N/A 02/15/2020   Procedure: LAPAROSCOPY DIAGNOSTIC;  Surgeon: Erroll Luna, MD;  Location: [place];  Service: General;  Laterality: N/A;  . LEFT HEART CATH AND CORONARY ANGIOGRAPHY N/A 03/21/2018   Procedure: LEFT HEART CATH AND CORONARY ANGIOGRAPHY;  Surgeon: Jettie Booze, MD;  Location: [place] [facility];  Service: Cardiovascular;  Laterality: N/A;  . MASS EXCISION N/A 08/29/2013   Procedure: EXCISION LEFT FLANK MASS/RIGHT EXCISION CHEST WALL MASS;  Surgeon: Stark Klein, MD;  Location: [place];  Service: General;  Laterality: N/A;  Left flank  . POLYPECTOMY    . TONSILLECTOMY     Family History  Problem Relation Age of Onset  . Stomach cancer Father   . Colon cancer Father   . Stomach cancer Sister        Passed away 2023-06-13  . Arthritis Mother   . Breast cancer Mother   . Pneumonia Mother   . Alzheimer's disease Mother   . Arthritis Paternal Grandmother   . Arthritis Maternal Grandmother   . Colon cancer Brother   . Esophageal cancer Neg Hx   . Rectal cancer Neg Hx    Social History   Tobacco Use  . Smoking status: Former Smoker    Types: Cigarettes    Quit date: 07/02/1991    Years since quitting: 29.3  . Smokeless tobacco: Never Used  Vaping Use  . Vaping Use: Never used  Substance Use Topics  . Alcohol use: No  . Drug use: No   Current Outpatient Medications  Medication Sig Dispense Refill  . albuterol (VENTOLIN HFA) 108 (90 Base)  MCG/ACT inhaler Inhale 2 puffs into the lungs every 6 (six) hours as needed for wheezing or shortness of breath. 18 g 1  . budesonide-formoterol (SYMBICORT) 80-4.5 MCG/ACT inhaler 2 puffs first thing in am  And 12 hours later take the 2nd two puffs if needed (Patient taking differently: 2 puffs first thing in am  And 12 hours later take the 2nd two puffs if needed) 1 each 12  . lansoprazole (PREVACID) 30 MG capsule Take 1 capsule (30 mg total) by mouth daily at 12 noon. 90 capsule 3  . Psyllium (METAMUCIL FREE & NATURAL) 43 % POWD Take 2 mLs by mouth daily. Metamucil with coffee    . vitamin B-12 (CYANOCOBALAMIN) 1000 MCG tablet Take 1,000 mcg by  mouth daily.      No current facility-administered medications for this visit.   Allergies  Allergen Reactions  . Aspirin Other (See Comments)    REACTION: nauseated, drooling     Review of Systems: All systems reviewed and negative except where noted in HPI.   Lab Results  Component Value Date   WBC 6.7 09/10/2020   HGB 14.4 09/10/2020   HCT 43.9 09/10/2020   MCV 77.3 (L) 09/10/2020   PLT 181.0 09/10/2020    Lab Results  Component Value Date   CREATININE 1.37 09/10/2020   BUN 16 09/10/2020   NA 137 09/10/2020   K 4.0 09/10/2020   CL 104 09/10/2020   CO2 27 09/10/2020     Physical Exam: BP 138/68   Pulse 74   Ht 5\' 10"  (1.778 m)   Wt 221 lb 12.8 oz (100.6 kg)   BMI 31.82 kg/m  Constitutional: Pleasant,well-developed, male in no acute distress. DRE - no fissure, prolapsed / swollen grade III internal hemorrhoids, most prominent in RP area Neurological: Alert and oriented to person place and time. Psychiatric: Normal mood and affect. Behavior is normal.   ASSESSMENT AND PLAN: 70 year old male here for reassessment of the following:  Grade 3 hemorrhoids Rectal bleeding  As above he has had banding series in 2019/2020 with resolution of symptoms but unfortunately had some recurrence.  Banded this past January to the RA  hemorrhoid and provided relief for a few months but now has since had recurrence and I think coming from the RP area.  We discussed options.  He has had multiple banding's and responds quite well but unclear how durable this will be at this point.  Discussed repeat banding of the RP area today to see if that will help versus referral to general surgery for consideration of surgical therapy.  He is really hoping to avoid surgery if at all possible.  I discussed risk benefits of banding and he wants to proceed today.  RP hemorrhoid banded as outlined below.  We will continue Metamucil, contact me in 2 weeks, if symptoms persistent he may seek surgical evaluation.  Dooling Cellar, MD [facility]   PROCEDURE NOTE: The patient presents with symptomatic grade III  hemorrhoids, requesting rubber band ligation of his/her hemorrhoidal disease.  All risks, benefits and alternative forms of therapy were described and informed consent was obtained. The anorectum was pre-medicated with 0.125% nitroglycerin ointment The decision was made to band the RP internal hemorrhoid, and the [place] was used to perform band ligation without complication.  Digital anorectal examination was then performed to assure proper positioning of the band, and to adjust the banded tissue as required.  The patient was discharged home without pain or other issues.  Dietary and behavioral recommendations were given and along with follow-up instructions.     The following adjunctive treatments were recommended: Continue daily fiber supplement  No complications were encountered and the patient tolerated the procedure well.

## 2020-11-20 NOTE — Progress Notes (Signed)
NEUROLOGY CONSULTATION NOTE  Anthony King MRN: [id number] DOB: 07-10-1950  Referring provider: Roma Schanz, DO Primary care provider: Roma Schanz, DO  Reason for consult:  Dizziness, weakness  Assessment/Plan:   Left leg weakness - presents with give-way weakness involving muscles of the entire leg, so not consistent with a particular myotome and no pain or sensory deficits that suggest a radiculopathy. However, would still evaluate for peripheral nerve/myopathic etiology.  Although he does not exhibit hyperreflexia or increased tone, may still consider UMN etiology.  1.  Check CK 2.  NCV-EMG left lower extremity 3.  MRI lumbar spine 4.  If testing not revealing, would check cervical/thoracic spine.   Subjective:  Anthony King is a 70 year old right-handed male with CAD and CKD stage III who presents for evaluation of dizziness and weakness.  History supplemented by referring provider's notes.  He had Covid-related pneumonia in April 2021.  Since then, he reports generalized weakness, but he particularly notes weakness of his left leg.  Some mild non-radiating low back pain but no radicular pain or numbness of the leg.  He had two hernia surgeries over the past year.  One was prior to getting Covid, but did not experience the weakness until after Covid.  CT abdomen and pelvis from March 2021 did not reveal any hematomas or fluid collection on the left.  He had a subsequent hernia repair.  He has residual bilateral inguinal numbness.  No change in bowel or bladder function.  No involvement of upper extremities.  Labs from February 2022 were unremarkable, including CBC, CMP (except for GFR 53.97), B12 (830), and TSH (3.92).  MRI of brain without contrast on 10/01/2020 personally reviewed showed mild chronic small vessel ischemic changes in the cerebral white matter bilaterally but overall unremarkable.    PAST MEDICAL HISTORY: Past Medical History:  Diagnosis  Date  . Aortic atherosclerosis ([place]) 03/19/2018  . Arthritis 07-17-11   hands, hips, knees  . Basal cell carcinoma    nose  . CAD (coronary artery disease), native coronary artery 03/19/2018  . Chronic kidney disease 07-17-11   past kidney stone x1  . CKD (chronic kidney disease), stage III ([place]) 03/18/2018  . Colon polyps    ADENOMATOUS AND HYPERPLASTIC POLYPS  . COVID-19   . Diverticulosis   . DOE (dyspnea on exertion) 12/07/2019   Onset with covid 19 pna  - see adimit 10/18/19  -  12/07/2019   Walked RA  3 laps @ approx 234ft each @ slow slt unsteady  pace  stopped due to sob / weakness but sats never less than 92%   . Dyspnea    with exertion  . Gastric ulcer   . GERD (gastroesophageal reflux disease)   . Hemorrhoids   . History of kidney stones   . Hyperlipidemia   . Hypoxia 10/18/2019  . Left lower quadrant abdominal pain 05/31/2018  . Pneumonia   . Pre-diabetes   . Prediabetes 03/19/2018  . Unstable angina ([place]) 03/19/2018  . Wears glasses     PAST SURGICAL HISTORY: Past Surgical History:  Procedure Laterality Date  . APPENDECTOMY    . BASAL CELL CARCINOMA EXCISION     nose  . CARDIAC CATHETERIZATION    . COLONOSCOPY    . EXCISION OF MESH Right 02/15/2020   Procedure: EXPLANTATION OF MESH;  Surgeon: Erroll Luna, MD;  Location: [place];  Service: General;  Laterality: Right;  . fatty tumors  07-17-11   excised x4-  1 -neck, 1 arm, 2 chest  . GROIN DISSECTION Right 02/15/2020   Procedure: RIGHT GROIN NEURECTOMY;  Surgeon: Erroll Luna, MD;  Location: [place];  Service: General;  Laterality: Right;  DR FAROOQUI APPROVED CORNETT USING HIS TIME PER ABBIE  . HEMORRHOID BANDING    . INGUINAL HERNIA REPAIR  07/23/2011   Procedure: LAPAROSCOPIC INGUINAL HERNIA;  Surgeon: Stark Klein, MD;  [facility];  Service: General;  Laterality: Left;  . INGUINAL HERNIA REPAIR Bilateral 04/19/2019   Procedure: BILATERAL OPEN INGUINAL HERNIA REPAIR WITH MESH;  Surgeon: Erroll Luna, MD;   Location: [place];  Service: General;  Laterality: Bilateral;  . INGUINAL HERNIA REPAIR Right 02/15/2020   Procedure: RIGHT HERNIA REPAIR INGUINAL WITH MESH;  Surgeon: Erroll Luna, MD;  Location: [place];  Service: General;  Laterality: Right;  . LAPAROSCOPY N/A 02/15/2020   Procedure: LAPAROSCOPY DIAGNOSTIC;  Surgeon: Erroll Luna, MD;  Location: [place];  Service: General;  Laterality: N/A;  . LEFT HEART CATH AND CORONARY ANGIOGRAPHY N/A 03/21/2018   Procedure: LEFT HEART CATH AND CORONARY ANGIOGRAPHY;  Surgeon: Jettie Booze, MD;  Location: [place] [facility];  Service: Cardiovascular;  Laterality: N/A;  . MASS EXCISION N/A 08/29/2013   Procedure: EXCISION LEFT FLANK MASS/RIGHT EXCISION CHEST WALL MASS;  Surgeon: Stark Klein, MD;  Location: [place];  Service: General;  Laterality: N/A;  Left flank  . POLYPECTOMY    . TONSILLECTOMY      MEDICATIONS: Current Outpatient Medications on File Prior to Visit  Medication Sig Dispense Refill  . albuterol (VENTOLIN HFA) 108 (90 Base) MCG/ACT inhaler Inhale 2 puffs into the lungs every 6 (six) hours as needed for wheezing or shortness of breath. 18 g 1  . budesonide-formoterol (SYMBICORT) 80-4.5 MCG/ACT inhaler 2 puffs first thing in am  And 12 hours later take the 2nd two puffs if needed (Patient taking differently: 2 puffs first thing in am  And 12 hours later take the 2nd two puffs if needed) 1 each 12  . lansoprazole (PREVACID) 30 MG capsule Take 1 capsule (30 mg total) by mouth daily at 12 noon. 90 capsule 3  . Psyllium (METAMUCIL FREE & NATURAL) 43 % POWD Take 2 mLs by mouth daily. Metamucil with coffee    . vitamin B-12 (CYANOCOBALAMIN) 1000 MCG tablet Take 1,000 mcg by mouth daily.      No current facility-administered medications on file prior to visit.    ALLERGIES: Allergies  Allergen Reactions  . Aspirin Other (See Comments)    REACTION: nauseated, drooling    FAMILY HISTORY: Family  History  Problem Relation Age of Onset  . Stomach cancer Father   . Colon cancer Father   . Stomach cancer Sister        Passed away 2023-06-17  . Arthritis Mother   . Breast cancer Mother   . Pneumonia Mother   . Alzheimer's disease Mother   . Arthritis Paternal Grandmother   . Arthritis Maternal Grandmother   . Colon cancer Brother   . Esophageal cancer Neg Hx   . Rectal cancer Neg Hx     Objective:  Blood pressure (!) 157/91, pulse 74, resp. rate 20, height 5\' 10"  (1.778 m), weight 222 lb (100.7 kg), SpO2 100 %. General: No acute distress.  Patient appears well-groomed.   Head:  Normocephalic/atraumatic Eyes:  fundi examined but not visualized Neck: supple, no paraspinal tenderness, full range of motion Back: No paraspinal tenderness Heart: regular rate and rhythm  Lungs: Clear to auscultation bilaterally. Vascular: No carotid bruits. Neurological Exam: Mental status: alert and oriented to person, place, and time, recent and remote memory intact, fund of knowledge intact, attention and concentration intact, speech fluent and not dysarthric, language intact. Cranial nerves: CN I: not tested CN II: pupils equal, round and reactive to light, visual fields intact CN III, IV, VI:  full range of motion, no nystagmus, no ptosis CN V: facial sensation intact. CN VII: upper and lower face symmetric CN VIII: hearing intact CN IX, X: gag intact, uvula midline CN XI: sternocleidomastoid and trapezius muscles intact CN XII: tongue midline Bulk & Tone: normal, no fasciculations. Motor:  Give-way weakness of all muscles in the left lower extremity; otherwise, muscle strength 5/5 throughout Sensation:  Pinprick, temperature and vibratory sensation intact. Deep Tendon Reflexes:  2+ throughout,  toes downgoing.   Finger to nose testing:  Without dysmetria.   Heel to shin:  Without dysmetria.   Gait:  Normal station and stride.  Romberg negative.    Thank you for allowing me to take part  in the care of this patient.  Metta Clines, DO  CC: Roma Schanz, DO

## 2020-11-21 ENCOUNTER — Other Ambulatory Visit: Payer: Self-pay

## 2020-11-21 ENCOUNTER — Ambulatory Visit (INDEPENDENT_AMBULATORY_CARE_PROVIDER_SITE_OTHER): Payer: Medicare Other | Admitting: Neurology

## 2020-11-21 ENCOUNTER — Other Ambulatory Visit (INDEPENDENT_AMBULATORY_CARE_PROVIDER_SITE_OTHER): Payer: Medicare Other

## 2020-11-21 ENCOUNTER — Encounter: Payer: Self-pay | Admitting: Neurology

## 2020-11-21 VITALS — BP 157/91 | HR 74 | Resp 20 | Ht 70.0 in | Wt 222.0 lb

## 2020-11-21 DIAGNOSIS — R29898 Other symptoms and signs involving the musculoskeletal system: Secondary | ICD-10-CM

## 2020-11-21 LAB — CK: Total CK: 59 U/L (ref 7–232)

## 2020-11-21 NOTE — Patient Instructions (Addendum)
1.  We will check: CK Nerve study of left leg  Your provider has requested that you have labwork completed today. Please go to [facility] ([id number]) on the second floor of this building before leaving the office today. You do not need to check in. If you are not called within 15 minutes please check with the front desk.  MRI of lumbar spine without contrast We have sent a referral to [facility] for your MRI and they will call you directly to schedule your appointment. They are located at [address]. If you need to contact them directly please call [phone].

## 2020-11-27 ENCOUNTER — Ambulatory Visit (INDEPENDENT_AMBULATORY_CARE_PROVIDER_SITE_OTHER): Payer: Medicare Other | Admitting: Neurology

## 2020-11-27 ENCOUNTER — Other Ambulatory Visit: Payer: Self-pay

## 2020-11-27 DIAGNOSIS — R29898 Other symptoms and signs involving the musculoskeletal system: Secondary | ICD-10-CM

## 2020-11-27 NOTE — Procedures (Signed)
[facility]  [place], [address]  [place], [place] [address] Tel: [phone] Fax:  [phone] Test Date:  11/27/2020  Patient: Anthony King DOB: Apr 06, 1951 Physician: Narda Amber, DO  Sex: Male Height: 5\' 10"  Ref Phys: Metta Clines, D.O.  ID#: [id number]   Technician:    Patient Complaints: This is a 70 year old man referred for evaluation of left leg weakness.  NCV & EMG Findings: Extensive electrodiagnostic testing of the left lower extremity shows:  1. Left sural and superficial peroneal sensory responses within normal limits. 2. Left peroneal and tibial motor responses are within normal limits. 3. Left tibial H reflex study is within normal limits. 4. There is no evidence of active or chronic motor axonal loss changes.  There is evidence of incomplete motor unit activation as seen by a variable recruitment pattern; these findings may be due to poor effort, pain, or central disorder of motor unit control.  Impression: This is a normal study of the left lower extremity.  In particular, there is no evidence of a diffuse myopathy, sensorimotor polyneuropathy, or lumbosacral radiculopathy.    Of note, there is incomplete motor unit activation as seen by a variable recruitment pattern; these findings may be due to poor effort, pain, or central disorder of motor unit control.   ___________________________ Narda Amber, DO    Nerve Conduction Studies Anti Sensory Summary Table   Stim Site NR Peak (ms) Norm Peak (ms) P-T Amp (V) Norm P-T Amp  Left Sup Peroneal Anti Sensory (Ant Lat Mall)  33C  12 cm    2.3 <4.6 8.9 >3  Left Sural Anti Sensory (Lat Mall)  33C  Calf    2.6 <4.6 7.5 >3   Motor Summary Table   Stim Site NR Onset (ms) Norm Onset (ms) O-P Amp (mV) Norm O-P Amp Site1 Site2 Delta-0 (ms) Dist (cm) Vel (m/s) Norm Vel (m/s)  Left Peroneal Motor (Ext Dig Brev)  33C  Ankle    3.8 <6.0 3.8 >2.5 B Fib Ankle 7.5 37.0 49 >40  B Fib    11.3  3.6   Poplt B Fib 1.7 8.0 47 >40  Poplt    13.0  3.5         Left Peroneal TA Motor (Tib Ant)  33C  Fib Head    3.1 <4.5 5.3 >3 Poplit Fib Head 1.4 8.0 57 >40  Poplit    4.5  5.1         Left Tibial Motor ([place]d Hall Brev)  33C  Ankle    3.6 <6.0 7.8 >4 Knee Ankle 9.0 43.0 48 >40  Knee    12.6  5.6          H Reflex Studies   NR H-Lat (ms) Lat Norm (ms) L-R H-Lat (ms)  Left Tibial (Gastroc)  33C     32.11 <35    EMG   Side Muscle Ins Act Fibs Psw Fasc Number Recrt Dur Dur. Amp Amp. Poly Poly. Comment  Left Gastroc Nml Nml Nml Nml 1- Mod-V Nml Nml Nml Nml Nml Nml N/A  Left Flex Dig Long Nml Nml Nml Nml 1- Mod-V Nml Nml Nml Nml Nml Nml N/A  Left RectFemoris Nml Nml Nml Nml Nml Nml Nml Nml Nml Nml Nml Nml N/A  Left GluteusMed Nml Nml Nml Nml 1- Mod-V Nml Nml Nml Nml Nml Nml N/A  Left BicepsFemS Nml Nml Nml Nml Nml Nml Nml Nml Nml Nml Nml Nml N/A  Left AntTibialis Nml Nml  Nml Nml 1- Mod-V Nml Nml Nml Nml Nml Nml N/A      Waveforms:

## 2020-12-07 ENCOUNTER — Ambulatory Visit
Admission: RE | Admit: 2020-12-07 | Discharge: 2020-12-07 | Disposition: A | Discharge status: Home / Self Care | Payer: Medicare Other | Source: Ambulatory Visit | Attending: Neurology | Admitting: Neurology

## 2020-12-07 ENCOUNTER — Other Ambulatory Visit: Payer: Self-pay

## 2020-12-07 DIAGNOSIS — M48061 Spinal stenosis, lumbar region without neurogenic claudication: Secondary | ICD-10-CM | POA: Diagnosis not present

## 2020-12-07 DIAGNOSIS — M5137 Other intervertebral disc degeneration, lumbosacral region: Secondary | ICD-10-CM | POA: Diagnosis not present

## 2020-12-11 ENCOUNTER — Telehealth: Payer: Self-pay | Admitting: Neurology

## 2020-12-11 NOTE — Telephone Encounter (Signed)
Per Anthony King MRI results:   MRI of lumbar spine overall unremarkable.  There is one nerve on the left that may be pinched but it would not explain weakness of the entire left leg.  I would like to check MRI of cervical and thoracic spine to evaluate for left legweakness and myelopathy.   Called patients wife Anthony King at [phone] and spoke to patient and informed him of Anthony King results of MRI results and recommendations. Patient stated that he thinks all this is coming from that COVID deal and will call Anthony King if he wants to proceed with additional testing.   Patient had no further questions or concerns and will call Anthony King if he needs him.

## 2020-12-11 NOTE — Telephone Encounter (Signed)
Pt's wife called in to get MRI results

## 2021-01-16 ENCOUNTER — Other Ambulatory Visit: Payer: Self-pay | Admitting: Family Medicine

## 2021-01-16 ENCOUNTER — Other Ambulatory Visit: Payer: Self-pay

## 2021-01-16 ENCOUNTER — Ambulatory Visit (INDEPENDENT_AMBULATORY_CARE_PROVIDER_SITE_OTHER): Payer: Medicare Other | Admitting: Family Medicine

## 2021-01-16 ENCOUNTER — Telehealth: Payer: Self-pay | Admitting: Internal Medicine

## 2021-01-16 ENCOUNTER — Encounter: Payer: Self-pay | Admitting: Family Medicine

## 2021-01-16 ENCOUNTER — Ambulatory Visit (HOSPITAL_BASED_OUTPATIENT_CLINIC_OR_DEPARTMENT_OTHER)
Admission: RE | Admit: 2021-01-16 | Discharge: 2021-01-16 | Disposition: A | Discharge status: Home / Self Care | Payer: Medicare Other | Source: Ambulatory Visit | Attending: Family Medicine | Admitting: Family Medicine

## 2021-01-16 VITALS — BP 110/60 | HR 67 | Resp 18 | Ht 70.0 in | Wt 221.6 lb

## 2021-01-16 DIAGNOSIS — R918 Other nonspecific abnormal finding of lung field: Secondary | ICD-10-CM | POA: Diagnosis not present

## 2021-01-16 DIAGNOSIS — J411 Mucopurulent chronic bronchitis: Secondary | ICD-10-CM

## 2021-01-16 DIAGNOSIS — U099 Post covid-19 condition, unspecified: Secondary | ICD-10-CM

## 2021-01-16 DIAGNOSIS — J849 Interstitial pulmonary disease, unspecified: Secondary | ICD-10-CM | POA: Diagnosis not present

## 2021-01-16 DIAGNOSIS — R0609 Other forms of dyspnea: Secondary | ICD-10-CM | POA: Diagnosis not present

## 2021-01-16 DIAGNOSIS — J42 Unspecified chronic bronchitis: Secondary | ICD-10-CM | POA: Diagnosis not present

## 2021-01-16 MED ORDER — ALBUTEROL SULFATE (2.5 MG/3ML) 0.083% IN NEBU: 2.5000 mg | INHALATION_SOLUTION | Freq: Four times a day (QID) | RESPIRATORY_TRACT | 12 refills | Status: DC | PRN

## 2021-01-16 MED ORDER — PREDNISONE 10 MG PO TABS: ORAL_TABLET | ORAL | 0 refills | Status: DC

## 2021-01-16 NOTE — Telephone Encounter (Signed)
Called and spoke with patient. He was seen by his PCP today and has decided that he wants to switch providers from Dr. Melvyn Novas. I asked him if he had anyone else he wanted to see or if his PCP made any recommendations, he stated no. I explained to him the office protocol, he verbalized understanding.   MW, are you ok with him switching to another provider?

## 2021-01-16 NOTE — Assessment & Plan Note (Addendum)
--  >  1 year ,  F/u pulmonary Will order neb for pt to use at home Pt has albuterol inhaler

## 2021-01-16 NOTE — Assessment & Plan Note (Signed)
pred taper  cxr  Neb with albuterol F/u pulmonary

## 2021-01-16 NOTE — Patient Instructions (Signed)
[email or web address]">  Chronic Bronchitis, Adult  Chronic bronchitis is inflammation of the large airways (bronchial tubes) that carry air to the lungs. The inflammation causes more mucus (sputum) to build up. The inflammation and mucus make it hard to breathe. Thiscondition is a type of chronic obstructive pulmonary disease (COPD). Chronic bronchitis is a long-term (chronic) condition. It is defined as a chronic cough with sputum production: For at least 3 months of the year. For 2 years in a row. People with chronic bronchitis are more likely to get colds and otherinfections in the nose, throat, or airways. What are the causes? This condition is most often caused by: A history of smoking. Exposure to secondhand smoke or a smoky area for a long period of time. Lung problems, such as emphysema, asthma, bronchiectasis, or cystic fibrosis. Long-term exposure to certain fumes or chemicals that irritate the lungs. Infection. What are the signs or symptoms? Symptoms of chronic bronchitis may include: A cough that brings up mucus (productive cough). Shortness of breath. A whistling sound when you breathe (wheezing). Chest tightness. Colds or respiratory infections that go away and return. How is this diagnosed? This condition may be diagnosed based on: Your symptoms and medical history. A physical exam. Tests, such as: Testing a sputum sample. Blood tests. A chest X-ray. Tests of lung (pulmonary) function. How is this treated? There is no cure for chronic bronchitis. Treatment may help control your symptoms. This includes: Drinking fluids. This may help thin your mucus so it is easier to cough up. Mucus-clearing techniques. Taking medicine prescribed by your health care provider, such as: Inhaled medicine to improve airflow in and out of your lungs. Antibiotics to treat or prevent bacterial lung infections. Using oxygen therapy, if your blood oxygen level is  very low. Pulmonary rehabilitation. This is a program that helps you learn how to manage your breathing problem. The program may include exercise, education, counseling, treatment, and support. Follow these instructions at home:  Medicines Take over-the-counter and prescription medicines only as told by your health care provider. If you were prescribed an antibiotic medicine, take it as told by your health care provider. Do not stop taking the antibiotic even if you start to feel better. Lifestyle  Do not use any products that contain nicotine or tobacco, such as cigarettes, e-cigarettes, and chewing tobacco. If you need help quitting, ask your health care provider. Stay away from other people's smoke (secondhand smoke) and any irritants that make you cough more, such as chemical fumes. Eat a healthy diet and get regular exercise. Talk with your health care provider about what activities are safe for you.  Preventing infections Stay up to date on all immunizations, including the pneumonia and flu vaccines. Wash your hands often with soap and water for at least 20 seconds. If soap and water are not available, use hand sanitizer. Avoid contact with people who have symptoms of a cold or the flu. General instructions Drink enough fluids to keep your urine pale yellow. Use oxygen therapy at home as directed. Follow instructions from your health care provider about how to use oxygen safely and take steps to prevent fire. Do not smoke while using oxygen or allow others to smoke in your home. Keep all follow-up visits as told by your health care provider. This is important. Contact a health care provider if: Your shortness of breath or coughing gets worse even when you take medicine. Your mucus gets thicker or changes color. You are not able to cough up your  mucus. You have a fever. Get help right away if: You have trouble breathing. You have chest pain. You feel dizzy or confused. These  symptoms may represent a serious problem that is an emergency. Do not wait to see if the symptoms will go away. Get medical help right away. Call your local emergency services (911 in the [place] Do not drive yourself to the hospital. Summary Chronic bronchitis is inflammation of the large airways (bronchial tubes) that carry air to the lungs. The inflammation causes mucus (sputum) to build up. The inflammation and mucus make it hard to breathe. If you were prescribed an antibiotic medicine, take it as told by your health care provider. Do not stop taking the antibiotic even if you start to feel better. Drink enough fluids to keep your urine pale yellow. Drinking fluids may help thin your mucus so it is easier to cough up. Do not use any products that contain nicotine or tobacco, such as cigarettes, e-cigarettes, and chewing tobacco. If you need help quitting, ask your health care provider. This information is not intended to replace advice given to you by your health care provider. Make sure you discuss any questions you have with your healthcare provider. Document Revised: 07/31/2019 Document Reviewed: 08/01/2019 Elsevier Patient Education  2022 [employer].

## 2021-01-16 NOTE — Progress Notes (Signed)
Subjective:   By signing my name below, I, Anthony King, attest that this documentation has been prepared under the direction and in the presence of Dr. Roma Schanz, DO. 01/16/2021    Patient ID: Anthony King, male    DOB: 10/14/1950, 70 y.o.   MRN: [id number]  Chief Complaint  Patient presents with   Shortness of Breath    sob, dysphagia, weakness-over a year now. Pt says a friend of his was seen for similar sxs and was put on steroid and breathing tx and felt better. He wonders if this will help him as well.    HPI Patient is in today for an office visit.  He reports that he has been having shortness of breath.  He has been feeling weak and is very tired after taking walks.   He reports that albuterol does not provide relief and is interested in getting a nebulizer.  He is not using the symbicort--- it was too expensive He also has not been back to pulmonary  He has a cough and reports that he coughs up clear sputum.He also mentions having rhinorrhea.  Past Medical History:  Diagnosis Date   Aortic atherosclerosis ([place]) 03/19/2018   Arthritis 07-17-11   hands, hips, knees   Basal cell carcinoma    nose   CAD (coronary artery disease), native coronary artery 03/19/2018   Chronic kidney disease 07-17-11   past kidney stone x1   CKD (chronic kidney disease), stage III ([place]) 03/18/2018   Colon polyps    ADENOMATOUS AND HYPERPLASTIC POLYPS   COVID-19    Diverticulosis    DOE (dyspnea on exertion) 12/07/2019   Onset with covid 19 pna  - see adimit 10/18/19  -  12/07/2019   Walked RA  3 laps @ approx 235ft each @ slow slt unsteady  pace  stopped due to sob / weakness but sats never less than 92%    Dyspnea    with exertion   Gastric ulcer    GERD (gastroesophageal reflux disease)    Hemorrhoids    History of kidney stones    Hyperlipidemia    Hypoxia 10/18/2019   Left lower quadrant abdominal pain 05/31/2018   Pneumonia    Pre-diabetes    Prediabetes 03/19/2018    Unstable angina ([place]) 03/19/2018   Wears glasses     Past Surgical History:  Procedure Laterality Date   APPENDECTOMY     BASAL CELL CARCINOMA EXCISION     nose   CARDIAC CATHETERIZATION     COLONOSCOPY     EXCISION OF MESH Right 02/15/2020   Procedure: EXPLANTATION OF MESH;  Surgeon: Erroll Luna, MD;  Location: [facility];  Service: General;  Laterality: Right;   fatty tumors  07-17-11   excised x4- 1 -neck, 1 arm, 2 chest   GROIN DISSECTION Right 02/15/2020   Procedure: RIGHT GROIN NEURECTOMY;  Surgeon: Erroll Luna, MD;  Location: [facility];  Service: General;  Laterality: Right;  DR FAROOQUI APPROVED [facility] USING HIS TIME PER ABBIE   HEMORRHOID BANDING     INGUINAL HERNIA REPAIR  07/23/2011   Procedure: LAPAROSCOPIC INGUINAL HERNIA;  Surgeon: Stark Klein, MD;  Location: [facility];  Service: General;  Laterality: Left;   INGUINAL HERNIA REPAIR Bilateral 04/19/2019   Procedure: BILATERAL OPEN INGUINAL HERNIA REPAIR WITH MESH;  Surgeon: Erroll Luna, MD;  Location: [facility];  Service: General;  Laterality: Bilateral;   INGUINAL HERNIA REPAIR Right 02/15/2020   Procedure: RIGHT HERNIA REPAIR INGUINAL  WITH MESH;  Surgeon: Erroll Luna, MD;  Location: [facility];  Service: General;  Laterality: Right;   LAPAROSCOPY N/A 02/15/2020   Procedure: LAPAROSCOPY DIAGNOSTIC;  Surgeon: Erroll Luna, MD;  Location: [facility];  Service: General;  Laterality: N/A;   LEFT HEART CATH AND CORONARY ANGIOGRAPHY N/A 03/21/2018   Procedure: LEFT HEART CATH AND CORONARY ANGIOGRAPHY;  Surgeon: Jettie Booze, MD;  Location: [facility];  Service: Cardiovascular;  Laterality: N/A;   MASS EXCISION N/A 08/29/2013   Procedure: EXCISION LEFT FLANK MASS/RIGHT EXCISION CHEST WALL MASS;  Surgeon: Stark Klein, MD;  Location: [facility];  Service: General;  Laterality: N/A;  Left flank   POLYPECTOMY     TONSILLECTOMY      Family History  Problem Relation Age of Onset   Stomach  cancer Father    Colon cancer Father    Stomach cancer Sister        Passed away 06/25/2023   Arthritis Mother    Breast cancer Mother    Pneumonia Mother    Alzheimer's disease Mother    Arthritis Paternal Grandmother    Arthritis Maternal Grandmother    Colon cancer Brother    Esophageal cancer Neg Hx    Rectal cancer Neg Hx     Social History   Socioeconomic History   Marital status: Married    Spouse name: Not on file   Number of children: 1   Years of education: Not on file   Highest education level: Not on file  Occupational History   Occupation: self-employed , covers furniture  Tobacco Use   Smoking status: Former    Types: Cigarettes    Quit date: 07/02/1991    Years since quitting: 29.5   Smokeless tobacco: Never  Vaping Use   Vaping Use: Never used  Substance and Sexual Activity   Alcohol use: No   Drug use: No   Sexual activity: Yes  Other Topics Concern   Not on file  Social History Narrative   Exercise- no   Right handed   No caffeine   One story home   Social Determinants of Health   Financial Resource Strain: Not on file  Food Insecurity: No Food Insecurity   Worried About Charity fundraiser in the Last Year: Never true   [place] in the Last Year: Never true  Transportation Needs: No Transportation Needs   Lack of Transportation (Medical): No   Lack of Transportation (Non-Medical): No  Physical Activity: Not on file  Stress: Not on file  Social Connections: Not on file  Intimate Partner Violence: Not on file    Outpatient Medications Prior to Visit  Medication Sig Dispense Refill   albuterol (VENTOLIN HFA) 108 (90 Base) MCG/ACT inhaler Inhale 2 puffs into the lungs every 6 (six) hours as needed for wheezing or shortness of breath. 18 g 1   budesonide-formoterol (SYMBICORT) 80-4.5 MCG/ACT inhaler 2 puffs first thing in am  And 12 hours later take the 2nd two puffs if needed (Patient taking differently: 2 puffs first thing in am  And 12  hours later take the 2nd two puffs if needed) 1 each 12   lansoprazole (PREVACID) 30 MG capsule Take 1 capsule (30 mg total) by mouth daily at 12 noon. 90 capsule 3   Psyllium (METAMUCIL FREE & NATURAL) 43 % POWD Take 2 mLs by mouth daily. Metamucil with coffee     vitamin B-12 (CYANOCOBALAMIN) 1000 MCG tablet Take 1,000  mcg by mouth daily.      No facility-administered medications prior to visit.    Allergies  Allergen Reactions   Aspirin Other (See Comments)    REACTION: nauseated, drooling    Review of Systems  Constitutional:  Negative for fever and malaise/fatigue.  HENT:  Negative for congestion.        (+)rhinorrhea  Eyes:  Negative for blurred vision.  Respiratory:  Positive for cough and shortness of breath.   Cardiovascular:  Positive for chest pain. Negative for palpitations and leg swelling.  Gastrointestinal:  Negative for vomiting.  Musculoskeletal:  Negative for back pain.  Skin:  Negative for rash.  Neurological:  Negative for loss of consciousness and headaches.      Objective:    Physical Exam Vitals and nursing note reviewed.  Constitutional:      General: He is not in acute distress.    Appearance: Normal appearance. He is not ill-appearing.  HENT:     Head: Normocephalic and atraumatic.     Right Ear: External ear normal.     Left Ear: External ear normal.  Eyes:     Extraocular Movements: Extraocular movements intact.     Pupils: Pupils are equal, round, and reactive to light.  Cardiovascular:     Rate and Rhythm: Normal rate and regular rhythm.     Pulses: Normal pulses.     Heart sounds: Normal heart sounds. No murmur heard. Pulmonary:     Effort: Pulmonary effort is normal. No respiratory distress.     Breath sounds: Decreased breath sounds (bilateral) present. No wheezing or rales.  Skin:    General: Skin is warm and dry.  Neurological:     Mental Status: He is alert and oriented to person, place, and time.  Psychiatric:        Behavior:  Behavior normal.        Judgment: Judgment normal.    BP 110/60 (BP Location: Left Arm, Patient Position: Sitting, Cuff Size: Normal)   Pulse 67   Resp 18   Ht 5\' 10"  (1.778 m)   Wt 221 lb 9.6 oz (100.5 kg)   SpO2 95%   BMI 31.80 kg/m  Wt Readings from Last 3 Encounters:  01/16/21 221 lb 9.6 oz (100.5 kg)  11/21/20 222 lb (100.7 kg)  11/05/20 221 lb 12.8 oz (100.6 kg)    Diabetic Foot Exam - Simple   No data filed    Lab Results  Component Value Date   WBC 6.7 09/10/2020   HGB 14.4 09/10/2020   HCT 43.9 09/10/2020   PLT 181.0 09/10/2020   GLUCOSE 118 (H) 09/10/2020   CHOL 147 03/18/2020   TRIG 88 03/18/2020   HDL 39 (L) 03/18/2020   LDLDIRECT 141.0 06/11/2016   LDLCALC 91 03/18/2020   ALT 20 09/10/2020   AST 20 09/10/2020   NA 137 09/10/2020   K 4.0 09/10/2020   CL 104 09/10/2020   CREATININE 1.37 09/10/2020   BUN 16 09/10/2020   CO2 27 09/10/2020   TSH 2.81 09/10/2020   PSA 0.68 06/11/2016   INR 1.01 03/18/2018   HGBA1C 5.7 (H) 03/18/2018    Lab Results  Component Value Date   TSH 2.81 09/10/2020   Lab Results  Component Value Date   WBC 6.7 09/10/2020   HGB 14.4 09/10/2020   HCT 43.9 09/10/2020   MCV 77.3 (L) 09/10/2020   PLT 181.0 09/10/2020   Lab Results  Component Value Date   NA 137 09/10/2020  K 4.0 09/10/2020   CO2 27 09/10/2020   GLUCOSE 118 (H) 09/10/2020   BUN 16 09/10/2020   CREATININE 1.37 09/10/2020   BILITOT 1.2 09/10/2020   ALKPHOS 105 09/10/2020   AST 20 09/10/2020   ALT 20 09/10/2020   PROT 6.4 09/10/2020   ALBUMIN 4.0 09/10/2020   CALCIUM 9.2 09/10/2020   ANIONGAP 8 02/15/2020   GFR 52.52 (L) 09/10/2020   Lab Results  Component Value Date   CHOL 147 03/18/2020   Lab Results  Component Value Date   HDL 39 (L) 03/18/2020   Lab Results  Component Value Date   LDLCALC 91 03/18/2020   Lab Results  Component Value Date   TRIG 88 03/18/2020   Lab Results  Component Value Date   CHOLHDL 3.8 03/18/2020   Lab  Results  Component Value Date   HGBA1C 5.7 (H) 03/18/2018       Assessment & Plan:   Problem List Items Addressed This Visit       Unprioritized   Chronic bronchitis with productive mucopurulent cough ([place]) - Primary    pred taper  cxr  Neb with albuterol F/u pulmonary        Relevant Medications   albuterol (PROVENTIL) (2.5 MG/3ML) 0.083% nebulizer solution   predniSONE (DELTASONE) 10 MG tablet   Other Relevant Orders   DG Chest 2 View (Completed)   Post-COVID chronic dyspnea    --  >1 year ,  F/u pulmonary Will order neb for pt to use at home Pt has albuterol inhaler           Meds ordered this encounter  Medications   albuterol (PROVENTIL) (2.5 MG/3ML) 0.083% nebulizer solution    Sig: Take 3 mLs (2.5 mg total) by nebulization every 6 (six) hours as needed for wheezing or shortness of breath.    Dispense:  75 mL    Refill:  12   predniSONE (DELTASONE) 10 MG tablet    Sig: TAKE 3 TABLETS PO QD FOR 3 DAYS THEN TAKE 2 TABLETS PO QD FOR 3 DAYS THEN TAKE 1 TABLET PO QD FOR 3 DAYS THEN TAKE 1/2 TAB PO QD FOR 3 DAYS    Dispense:  20 tablet    Refill:  0    I, Dr. Roma Schanz, DO.  , personally preformed the services described in this documentation.  All medical record entries made by the scribe were at my direction and in my presence.  I have reviewed the chart and discharge instructions (if applicable) and agree that the record reflects my personal performance and is accurate and complete. 01/16/2021   I,Anthony King,acting as a Education administrator for [employer], DO.,have documented all relevant documentation on the behalf of Ann Held, DO,as directed by  Ann Held, DO while in the presence of Ann Held, DO.   Ann Held, DO

## 2021-01-17 NOTE — Telephone Encounter (Signed)
I am fine with it but note for the record he had no cough or complaints of limiting doe when last seen in 02/2020 with plans to f/u in 3 months and I haven't heard from him since.

## 2021-01-21 NOTE — Telephone Encounter (Signed)
Called and spoke to pt's wife, (on [place]). Appt scheduled with Dr. Erin Fulling, 30 min new pt appt, on 8/24. Pts wife verbalized understanding and denied any further questions or concerns at this time.

## 2021-02-06 ENCOUNTER — Ambulatory Visit (INDEPENDENT_AMBULATORY_CARE_PROVIDER_SITE_OTHER): Payer: Medicare Other | Admitting: Family Medicine

## 2021-02-06 ENCOUNTER — Other Ambulatory Visit: Payer: Self-pay

## 2021-02-06 ENCOUNTER — Encounter: Payer: Self-pay | Admitting: Family Medicine

## 2021-02-06 VITALS — BP 134/72 | HR 73 | Temp 98.7°F | Resp 18 | Ht 70.0 in | Wt 221.4 lb

## 2021-02-06 DIAGNOSIS — R252 Cramp and spasm: Secondary | ICD-10-CM | POA: Diagnosis not present

## 2021-02-06 DIAGNOSIS — R3129 Other microscopic hematuria: Secondary | ICD-10-CM

## 2021-02-06 DIAGNOSIS — J40 Bronchitis, not specified as acute or chronic: Secondary | ICD-10-CM | POA: Diagnosis not present

## 2021-02-06 DIAGNOSIS — R350 Frequency of micturition: Secondary | ICD-10-CM

## 2021-02-06 LAB — COMPREHENSIVE METABOLIC PANEL
ALT: 20 U/L (ref 0–53)
AST: 20 U/L (ref 0–37)
Albumin: 4.1 g/dL (ref 3.5–5.2)
Alkaline Phosphatase: 99 U/L (ref 39–117)
BUN: 17 mg/dL (ref 6–23)
CO2: 27 mEq/L (ref 19–32)
Calcium: 9.4 mg/dL (ref 8.4–10.5)
Chloride: 107 mEq/L (ref 96–112)
Creatinine, Ser: 1.56 mg/dL — ABNORMAL HIGH (ref 0.40–1.50)
GFR: 44.81 mL/min — ABNORMAL LOW (ref >60.00)
Glucose, Bld: 99 mg/dL (ref 70–99)
Potassium: 4.5 mEq/L (ref 3.5–5.1)
Sodium: 141 mEq/L (ref 135–145)
Total Bilirubin: 1 mg/dL (ref 0.2–1.2)
Total Protein: 6.3 g/dL (ref 6.0–8.3)

## 2021-02-06 LAB — CBC WITH DIFFERENTIAL/PLATELET
Basophils Absolute: 0 10*3/uL (ref 0.0–0.1)
Basophils Relative: 0.5 % (ref 0.0–3.0)
Eosinophils Absolute: 0.2 10*3/uL (ref 0.0–0.7)
Eosinophils Relative: 3.2 % (ref 0.0–5.0)
HCT: 44.6 % (ref 39.0–52.0)
Hemoglobin: 14.4 g/dL (ref 13.0–17.0)
Lymphocytes Relative: 25.4 % (ref 12.0–46.0)
Lymphs Abs: 1.3 10*3/uL (ref 0.7–4.0)
MCHC: 32.3 g/dL (ref 30.0–36.0)
MCV: 80.3 fl (ref 78.0–100.0)
Monocytes Absolute: 0.6 10*3/uL (ref 0.1–1.0)
Monocytes Relative: 10.8 % (ref 3.0–12.0)
Neutro Abs: 3.1 10*3/uL (ref 1.4–7.7)
Neutrophils Relative %: 60.1 % (ref 43.0–77.0)
Platelets: 177 10*3/uL (ref 150.0–400.0)
RBC: 5.56 Mil/uL (ref 4.22–5.81)
RDW: 15.8 % — ABNORMAL HIGH (ref 11.5–15.5)
WBC: 5.2 10*3/uL (ref 4.0–10.5)

## 2021-02-06 LAB — POC URINALSYSI DIPSTICK (AUTOMATED)
Glucose, UA: NEGATIVE
Ketones, UA: NEGATIVE
Leukocytes, UA: NEGATIVE
Nitrite, UA: NEGATIVE
Protein, UA: POSITIVE — AB
Spec Grav, UA: 1.025 (ref 1.010–1.025)
Urobilinogen, UA: 0.2 E.U./dL
pH, UA: 6 (ref 5.0–8.0)

## 2021-02-06 LAB — PSA: PSA: 0.74 ng/mL (ref 0.10–4.00)

## 2021-02-06 LAB — MAGNESIUM: Magnesium: 2 mg/dL (ref 1.5–2.5)

## 2021-02-06 NOTE — Assessment & Plan Note (Signed)
Symptoms improved S/p covid  pulm referral pending  con't neb tx and symbicort

## 2021-02-06 NOTE — Patient Instructions (Signed)
Muscle Cramps and Spasms Muscle cramps and spasms occur when a muscle or muscles tighten and you have no control over this tightening (involuntary muscle contraction). They are a common problem and can develop in any muscle. The most common place is in the calf muscles of the leg. Muscle cramps and muscle spasms are both involuntary muscle contractions, but there are some differences between the two: Muscle cramps are painful. They come and go and may last for a few seconds or up to 15 minutes. Muscle cramps are often more forceful and last longer than muscle spasms. Muscle spasms may or may not be painful. They may also last just a few seconds or much longer. Certain medical conditions, such as diabetes or Parkinson's disease, can make it more likely to develop cramps or spasms. However, cramps or spasms are usually not caused by a serious underlying problem. Common causes include: Doing more physical work or exercise than your body is ready for (overexertion). Overuse from repeating certain movements too many times. Remaining in a certain position for a long period of time. Improper preparation, form, or technique while playing a sport or doing an activity. Dehydration. Injury. Side effects of some medicines. Abnormally low levels of the salts and minerals in your blood (electrolytes), especially potassium and calcium. This could happen if you are taking water pills (diuretics) or if you are pregnant. In many cases, the cause of muscle cramps or spasms is not known. Follow these instructions at home: Managing pain and stiffness     Try massaging, stretching, and relaxing the affected muscle. Do this for several minutes at a time. If directed, apply heat to tight or tense muscles as often as told by your health care provider. Use the heat source that your health care provider recommends, such as a moist heat pack or a heating pad. Place a towel between your skin and the heat source. Leave the  heat on for 20-30 minutes. Remove the heat if your skin turns bright red. This is especially important if you are unable to feel pain, heat, or cold. You may have a greater risk of getting burned. If directed, put ice on the affected area. This may help if you are sore or have pain after a cramp or spasm. Put ice in a plastic bag. Place a towel between your skin and the bag. Leave the ice on for 20 minutes, 2-3 times a day. Try taking hot showers or baths to help relax tight muscles. Eating and drinking Drink enough fluid to keep your urine pale yellow. Staying well hydrated may help prevent cramps or spasms. Eat a healthy diet that includes plenty of nutrients to help your muscles function. A healthy diet includes fruits and vegetables, lean protein, whole grains, and low-fat or nonfat dairy products. General instructions If you are having frequent cramps, avoid intense exercise for several days. Take over-the-counter and prescription medicines only as told by your health care provider. Pay attention to any changes in your symptoms. Keep all follow-up visits as told by your health care provider. This is important. Contact a health care provider if: Your cramps or spasms get more severe or happen more often. Your cramps or spasms do not improve over time. Summary Muscle cramps and spasms occur when a muscle or muscles tighten and you have no control over this tightening (involuntary muscle contraction). The most common place for cramps or spasms to occur is in the calf muscles of the leg. Massaging, stretching, and relaxing the affected   muscle may relieve the cramp or spasm. Drink enough fluid to keep your urine pale yellow. Staying well hydrated may help prevent cramps or spasms. This information is not intended to replace advice given to you by your health care provider. Make sure you discuss any questions you have with your healthcare provider. Document Revised: 11/08/2017 Document  Reviewed: 11/08/2017 Elsevier Patient Education  2022 [employer] Inc[employer]  

## 2021-02-06 NOTE — Assessment & Plan Note (Signed)
Check urine today 

## 2021-02-06 NOTE — Progress Notes (Signed)
Subjective:   By signing my name below, I, [name], attest that this documentation has been prepared under the direction and in the presence of Dr. Roma Schanz, DO. 02/06/2021    Patient ID: Anthony King, male    DOB: July 11, 1950, 70 y.o.   MRN: [id number]  Chief Complaint  Patient presents with   Bronchitis   Follow-up    HPI Patient is in today for a office visit.  He complains of having cramps in both his lower legs and both his shoulders at night. He notes while having a cramp in one shoulder he will try to massage it with the other hand and get a cramp in that shoulder. He thinks his cramps are due to his 3 mg prednisone.  He reports having urinary frequency at night and getting up 2-3x a night. He is requesting a PSA screening and kidney function screening during his next lab work.  He reports having a new appointment with a pulmonary specialist and is thinking about canceling it due to his bronchitis symptoms improving. He continues having chest congestion. He continues seeing a nebulizer daily and reports doing well. He notes that he uses it every day due to feeling SOB if he does not use it. He continues using Symbicort and reports doing well on it.    Past Medical History:  Diagnosis Date   Aortic atherosclerosis ([place]) 03/19/2018   Arthritis 07-17-11   hands, hips, knees   Basal cell carcinoma    nose   CAD (coronary artery disease), native coronary artery 03/19/2018   Chronic kidney disease 07-17-11   past kidney stone x1   CKD (chronic kidney disease), stage III ([place]) 03/18/2018   Colon polyps    ADENOMATOUS AND HYPERPLASTIC POLYPS   COVID-19    Diverticulosis    DOE (dyspnea on exertion) 12/07/2019   Onset with covid 19 pna  - see adimit 10/18/19  -  12/07/2019   Walked RA  3 laps @ approx 241f each @ slow slt unsteady  pace  stopped due to sob / weakness but sats never less than 92%    Dyspnea    with exertion   Gastric ulcer    GERD  (gastroesophageal reflux disease)    Hemorrhoids    History of kidney stones    Hyperlipidemia    Hypoxia 10/18/2019   Left lower quadrant abdominal pain 05/31/2018   Pneumonia    Pre-diabetes    Prediabetes 03/19/2018   Unstable angina ([facility] 03/19/2018   Wears glasses     Past Surgical History:  Procedure Laterality Date   APPENDECTOMY     BASAL CELL CARCINOMA EXCISION     nose   CARDIAC CATHETERIZATION     COLONOSCOPY     EXCISION OF MESH Right 02/15/2020   Procedure: EXPLANTATION OF MESH;  Surgeon: CErroll Luna MD;  [facility]: [facility]  Service: General;  Laterality: Right;   fatty tumors  07-17-11   excised x4- 1 -neck, 1 arm, 2 chest   GROIN DISSECTION Right 02/15/2020   Procedure: RIGHT GROIN NEURECTOMY;  Surgeon: CErroll Luna MD;  [facility]  Service: General;  Laterality: Right;  DR FAROOQUI APPROVED CORNETT USING HIS TIME PER ABBIE   [place]ID [facility] 07/23/2011   Procedure: LAPAROSCOPIC INGUINAL HERNIA;  Surgeon: FStark Klein MD;  Location: [facility];  Service: General;  Laterality: Left;   INGUINAL HERNIA REPAIR Bilateral 04/19/2019   Procedure: BILATERAL OPEN INGUINAL  HERNIA REPAIR WITH MESH;  Surgeon: Erroll Luna, MD;  Location: [facility];  Service: General;  Laterality: Bilateral;   INGUINAL HERNIA REPAIR Right 02/15/2020   Procedure: RIGHT HERNIA REPAIR INGUINAL WITH MESH;  Surgeon: Erroll Luna, MD;  Location: [facility];  Service: General;  Laterality: Right;   LAPAROSCOPY N/A 02/15/2020   Procedure: LAPAROSCOPY DIAGNOSTIC;  Surgeon: Erroll Luna, MD;  Location: [facility];  Service: General;  Laterality: N/A;   LEFT HEART CATH AND CORONARY ANGIOGRAPHY N/A 03/21/2018   Procedure: LEFT HEART CATH AND CORONARY ANGIOGRAPHY;  Surgeon: Jettie Booze, MD;  Location: [facility];  Service: Cardiovascular;  Laterality: N/A;   MASS EXCISION N/A 08/29/2013   Procedure: EXCISION LEFT FLANK MASS/RIGHT EXCISION CHEST  WALL MASS;  Surgeon: Stark Klein, MD;  Location: [facility];  Service: General;  Laterality: N/A;  Left flank   POLYPECTOMY     TONSILLECTOMY      Family History  Problem Relation Age of Onset   Stomach cancer Father    Colon cancer Father    Stomach cancer Sister        Passed away 06-28-2023   Arthritis Mother    Breast cancer Mother    Pneumonia Mother    Alzheimer's disease Mother    Arthritis Paternal Grandmother    Arthritis Maternal Grandmother    Colon cancer Brother    Esophageal cancer Neg Hx    Rectal cancer Neg Hx     Social History   Socioeconomic History   Marital status: Married    Spouse name: Not on file   Number of children: 1   Years of education: Not on file   Highest education level: Not on file  Occupational History   Occupation: self-employed , covers furniture  Tobacco Use   Smoking status: Former    Types: Cigarettes    Quit date: 07/02/1991    Years since quitting: 29.6   Smokeless tobacco: Never  Vaping Use   Vaping Use: Never used  Substance and Sexual Activity   Alcohol use: No   Drug use: No   Sexual activity: Yes  Other Topics Concern   Not on file  Social History Narrative   Exercise- no   Right handed   No caffeine   One story home   Social Determinants of Health   Financial Resource Strain: Not on file  Food Insecurity: No Food Insecurity   Worried About Charity fundraiser in the Last Year: Never true   [place] in the Last Year: Never true  Transportation Needs: No Transportation Needs   Lack of Transportation (Medical): No   Lack of Transportation (Non-Medical): No  Physical Activity: Not on file  Stress: Not on file  Social Connections: Not on file  Intimate Partner Violence: Not on file    Outpatient Medications Prior to Visit  Medication Sig Dispense Refill   albuterol (PROVENTIL) (2.5 MG/3ML) 0.083% nebulizer solution Take 3 mLs (2.5 mg total) by nebulization every 6 (six) hours as needed for  wheezing or shortness of breath. 75 mL 12   albuterol (VENTOLIN HFA) 108 (90 Base) MCG/ACT inhaler Inhale 2 puffs into the lungs every 6 (six) hours as needed for wheezing or shortness of breath. 18 g 1   budesonide-formoterol (SYMBICORT) 80-4.5 MCG/ACT inhaler 2 puffs first thing in am  And 12 hours later take the 2nd two puffs if needed (Patient taking differently: 2 puffs first thing in am  And 12 hours later take the 2nd two puffs if needed) 1 each 12   lansoprazole (PREVACID) 30 MG capsule Take 1 capsule (30 mg total) by mouth daily at 12 noon. 90 capsule 3   Psyllium (METAMUCIL FREE & NATURAL) 43 % POWD Take 2 mLs by mouth daily. Metamucil with coffee     vitamin B-12 (CYANOCOBALAMIN) 1000 MCG tablet Take 1,000 mcg by mouth daily.      predniSONE (DELTASONE) 10 MG tablet TAKE 3 TABLETS PO QD FOR 3 DAYS THEN TAKE 2 TABLETS PO QD FOR 3 DAYS THEN TAKE 1 TABLET PO QD FOR 3 DAYS THEN TAKE 1/2 TAB PO QD FOR 3 DAYS (Patient not taking: Reported on 02/06/2021) 20 tablet 0   No facility-administered medications prior to visit.    Allergies  Allergen Reactions   Aspirin Other (See Comments)    REACTION: nauseated, drooling    Review of Systems  Respiratory:         (+)Chest congestion  Genitourinary:  Positive for frequency (At night).  Musculoskeletal:  Positive for myalgias (bilateral shoulder and bilateral lower leg cramps).      Objective:    Physical Exam Constitutional:      General: He is not in acute distress.    Appearance: Normal appearance. He is not ill-appearing.  HENT:     Head: Normocephalic and atraumatic.     Right Ear: External ear normal.     Left Ear: External ear normal.  Eyes:     Extraocular Movements: Extraocular movements intact.     Pupils: Pupils are equal, round, and reactive to light.  Cardiovascular:     Rate and Rhythm: Normal rate and regular rhythm.     Heart sounds: Normal heart sounds. No murmur heard.   No gallop.  Pulmonary:     Effort:  Pulmonary effort is normal. No respiratory distress.     Breath sounds: Normal breath sounds. No wheezing or rales.  Skin:    General: Skin is warm and dry.  Neurological:     Mental Status: He is alert and oriented to person, place, and time.  Psychiatric:        Behavior: Behavior normal.    BP 134/72 (BP Location: Right Arm, Patient Position: Sitting, Cuff Size: Normal)   Pulse 73   Temp 98.7 F (37.1 C) (Oral)   Resp 18   Ht '5\' 10"'$  (1.778 m)   Wt 221 lb 6.4 oz (100.4 kg)   SpO2 100%   BMI 31.77 kg/m  Wt Readings from Last 3 Encounters:  02/06/21 221 lb 6.4 oz (100.4 kg)  01/16/21 221 lb 9.6 oz (100.5 kg)  11/21/20 222 lb (100.7 kg)    Diabetic Foot Exam - Simple   No data filed    Lab Results  Component Value Date   WBC 6.7 09/10/2020   HGB 14.4 09/10/2020   HCT 43.9 09/10/2020   PLT 181.0 09/10/2020   GLUCOSE 118 (H) 09/10/2020   CHOL 147 03/18/2020   TRIG 88 03/18/2020   HDL 39 (L) 03/18/2020   LDLDIRECT 141.0 06/11/2016   LDLCALC 91 03/18/2020   ALT 20 09/10/2020   AST 20 09/10/2020   NA 137 09/10/2020   K 4.0 09/10/2020   CL 104 09/10/2020   CREATININE 1.37 09/10/2020   BUN 16 09/10/2020   CO2 27 09/10/2020   TSH 2.81 09/10/2020   PSA 0.68 06/11/2016   INR 1.01 03/18/2018   HGBA1C 5.7 (H) 03/18/2018    Lab  Results  Component Value Date   TSH 2.81 09/10/2020   Lab Results  Component Value Date   WBC 6.7 09/10/2020   HGB 14.4 09/10/2020   HCT 43.9 09/10/2020   MCV 77.3 (L) 09/10/2020   PLT 181.0 09/10/2020   Lab Results  Component Value Date   NA 137 09/10/2020   K 4.0 09/10/2020   CO2 27 09/10/2020   GLUCOSE 118 (H) 09/10/2020   BUN 16 09/10/2020   CREATININE 1.37 09/10/2020   BILITOT 1.2 09/10/2020   ALKPHOS 105 09/10/2020   AST 20 09/10/2020   ALT 20 09/10/2020   PROT 6.4 09/10/2020   ALBUMIN 4.0 09/10/2020   CALCIUM 9.2 09/10/2020   ANIONGAP 8 02/15/2020   GFR 52.52 (L) 09/10/2020   Lab Results  Component Value Date    CHOL 147 03/18/2020   Lab Results  Component Value Date   HDL 39 (L) 03/18/2020   Lab Results  Component Value Date   LDLCALC 91 03/18/2020   Lab Results  Component Value Date   TRIG 88 03/18/2020   Lab Results  Component Value Date   CHOLHDL 3.8 03/18/2020   Lab Results  Component Value Date   HGBA1C 5.7 (H) 03/18/2018       Assessment & Plan:   Problem List Items Addressed This Visit       Unprioritized   Bronchitis - Primary    Symptoms improved S/p covid  pulm referral pending  con't neb tx and symbicort       Frequent urination    Check urine today      Relevant Orders   PSA   POCT Urinalysis Dipstick (Automated) (Completed)   Urine Culture   Muscle cramps   Relevant Orders   CBC with Differential/Platelet   Comprehensive metabolic panel   Magnesium   Other Visit Diagnoses     Microscopic hematuria       Relevant Orders   Urine Culture        No orders of the defined types were placed in this encounter.   I, Dr. Roma Schanz, DO, personally preformed the services described in this documentation.  All medical record entries made by the scribe were at my direction and in my presence.  I have reviewed the chart and discharge instructions (if applicable) and agree that the record reflects my personal performance and is accurate and complete. 02/06/2021   I,[name],acting as a Education administrator for [employer], DO.,have documented all relevant documentation on the behalf of Ann Held, DO,as directed by  Ann Held, DO while in the presence of Ann Held, DO.   Ann Held, DO

## 2021-02-07 LAB — URINE CULTURE
MICRO NUMBER:: 12230523
Result:: NO GROWTH
SPECIMEN QUALITY:: ADEQUATE

## 2021-02-12 ENCOUNTER — Telehealth: Payer: Self-pay | Admitting: *Deleted

## 2021-02-12 ENCOUNTER — Other Ambulatory Visit: Payer: Self-pay | Admitting: *Deleted

## 2021-02-12 DIAGNOSIS — N1831 Chronic kidney disease, stage 3a: Secondary | ICD-10-CM

## 2021-02-12 NOTE — Telephone Encounter (Signed)
FYI

## 2021-02-12 NOTE — Telephone Encounter (Signed)
-----   Message from Ann Held, DO sent at 02/10/2021  9:42 PM EDT ----- Kidney function -decreased-----   cr is elevated , gfr is low------  is the pt still going to [facility]

## 2021-02-12 NOTE — Telephone Encounter (Signed)
Patient notified of labs. He does not see nephrology.  He stated he stopped going because he was seeing to many doctors at the time.  Referral placed to go back to [facility].

## 2021-02-19 ENCOUNTER — Ambulatory Visit (INDEPENDENT_AMBULATORY_CARE_PROVIDER_SITE_OTHER): Payer: Medicare Other | Admitting: Pulmonary Disease

## 2021-02-19 ENCOUNTER — Encounter: Payer: Self-pay | Admitting: Pulmonary Disease

## 2021-02-19 ENCOUNTER — Other Ambulatory Visit: Payer: Self-pay

## 2021-02-19 VITALS — BP 122/78 | HR 76 | Temp 98.9°F | Ht 70.0 in | Wt 223.2 lb

## 2021-02-19 DIAGNOSIS — J849 Interstitial pulmonary disease, unspecified: Secondary | ICD-10-CM

## 2021-02-19 DIAGNOSIS — R0602 Shortness of breath: Secondary | ICD-10-CM

## 2021-02-19 DIAGNOSIS — R5383 Other fatigue: Secondary | ICD-10-CM | POA: Diagnosis not present

## 2021-02-19 MED ORDER — BUDESONIDE 0.5 MG/2ML IN SUSP: 0.5000 mg | Freq: Two times a day (BID) | RESPIRATORY_TRACT | 11 refills | Status: DC

## 2021-02-19 NOTE — Progress Notes (Signed)
Synopsis: Referred in June 2021 for post-covid19 dyspnea, previously followed by Dr. Melvyn Novas  Subjective:   PATIENT ID: Anthony King GENDER: male DOB: August 15, 1950, MRN: [id number]   HPI  Chief Complaint  Patient presents with   Consult    Coughing with clear sputum and Sob with exertion.     Anthony King is a 70 year old male, former smoker with coronary artery disease, chronic kidney disease and GERD who returns to pulmonary clinic to establish care with new pulmonologist for ongoing dyspnea after COVID-19 infection.  He had COVID in April 2021 reports having shortness of breath and fatigue since he was hospitalized for the viral infection.  He is currently using albuterol nebulizer treatments 3 times per day with relief.  He was not able to obtain Symbicort inhaler as it cost $380.  Prior to COVID-19 he did not have any issues with shortness of breath.  He denies any seasonal issues with his breathing.  Hot humid weather does bother his breathing.  He is a former smoker he quit in 1992.  He started smoking at 70 years old and never smoked more than a pack per day.  He has worked as a Air traffic controller and denies any dust or harmful chemical exposures.  He complains of significant fatigue since his infection last year.  He does not sleep well.  No history of witnessed apneas.  Past Medical History:  Diagnosis Date   Aortic atherosclerosis ([place]) 03/19/2018   Arthritis 07-17-11   hands, hips, knees   Basal cell carcinoma    nose   CAD (coronary artery disease), native coronary artery 03/19/2018   Chronic kidney disease 07-17-11   past kidney stone x1   CKD (chronic kidney disease), stage III ([place]) 03/18/2018   Colon polyps    ADENOMATOUS AND HYPERPLASTIC POLYPS   COVID-19    Diverticulosis    DOE (dyspnea on exertion) 12/07/2019   Onset with covid 19 pna  - see adimit 10/18/19  -  12/07/2019   Walked RA  3 laps @ approx 267f each @ slow slt unsteady  pace  stopped due to  sob / weakness but sats never less than 92%    Dyspnea    with exertion   Gastric ulcer    GERD (gastroesophageal reflux disease)    Hemorrhoids    History of kidney stones    Hyperlipidemia    Hypoxia 10/18/2019   Left lower quadrant abdominal pain 05/31/2018   Pneumonia    Pre-diabetes    Prediabetes 03/19/2018   Unstable angina [place] 03/19/2018   Wears glasses      Family History  Problem Relation Age of Onset   Stomach cancer Father    Colon cancer Father    Stomach cancer Sister        Passed away 112-20-2024  Arthritis Mother    Breast cancer Mother    Pneumonia Mother    Alzheimer's disease Mother    Arthritis Paternal Grandmother    Arthritis Maternal Grandmother    Colon cancer Brother    Esophageal cancer Neg Hx    Rectal cancer Neg Hx      Social History   Socioeconomic History   Marital status: Married    Spouse name: Not on file   Number of children: 1   Years of education: Not on file   Highest education level: Not on file  Occupational History   Occupation: self-employed , covers furniture  Tobacco Use   Smoking  status: Former    Types: Cigarettes    Start date: 1963    Quit date: 07/02/1991    Years since quitting: 29.6   Smokeless tobacco: Never  Vaping Use   Vaping Use: Never used  Substance and Sexual Activity   Alcohol use: No   Drug use: No   Sexual activity: Yes  Other Topics Concern   Not on file  Social History Narrative   Exercise- no   Right handed   No caffeine   One story home   Social Determinants of Health   Financial Resource Strain: Not on file  Food Insecurity: No Food Insecurity   Worried About Charity fundraiser in the Last Year: Never true   Ran Out of Food in the Last Year: Never true  Transportation Needs: No Transportation Needs   Lack of Transportation (Medical): No   Lack of Transportation (Non-Medical): No  Physical Activity: Not on file  Stress: Not on file  Social Connections: Not on file  Intimate Partner  Violence: Not on file     Allergies  Allergen Reactions   Aspirin Other (See Comments)    REACTION: nauseated, drooling     Outpatient Medications Prior to Visit  Medication Sig Dispense Refill   albuterol (PROVENTIL) (2.5 MG/3ML) 0.083% nebulizer solution Take 3 mLs (2.5 mg total) by nebulization every 6 (six) hours as needed for wheezing or shortness of breath. 75 mL 12   lansoprazole (PREVACID) 30 MG capsule Take 1 capsule (30 mg total) by mouth daily at 12 noon. 90 capsule 3   Psyllium (METAMUCIL FREE & NATURAL) 43 % POWD Take 2 mLs by mouth daily. Metamucil with coffee     vitamin B-12 (CYANOCOBALAMIN) 1000 MCG tablet Take 1,000 mcg by mouth daily.      albuterol (VENTOLIN HFA) 108 (90 Base) MCG/ACT inhaler Inhale 2 puffs into the lungs every 6 (six) hours as needed for wheezing or shortness of breath. (Patient not taking: Reported on 02/19/2021) 18 g 1   budesonide-formoterol (SYMBICORT) 80-4.5 MCG/ACT inhaler 2 puffs first thing in am  And 12 hours later take the 2nd two puffs if needed (Patient not taking: Reported on 02/19/2021) 1 each 12   No facility-administered medications prior to visit.    Review of Systems  Constitutional:  Positive for malaise/fatigue. Negative for chills, fever and weight loss.  HENT:  Negative for congestion, sinus pain and sore throat.   Eyes: Negative.   Respiratory:  Positive for shortness of breath. Negative for cough, hemoptysis, sputum production and wheezing.   Cardiovascular:  Negative for chest pain, palpitations, orthopnea, claudication and leg swelling.  Gastrointestinal:  Negative for abdominal pain, heartburn, nausea and vomiting.  Genitourinary: Negative.   Musculoskeletal:  Negative for joint pain and myalgias.  Skin:  Negative for rash.  Neurological:  Negative for weakness.  Endo/Heme/Allergies: Negative.   Psychiatric/Behavioral: Negative.       Objective:   Vitals:   02/19/21 1625  BP: 122/78  Pulse: 76  Temp: 98.9 F  (37.2 C)  TempSrc: Oral  SpO2: 96%  Weight: 101.2 kg  Height: '5\' 10"'$  (1.778 m)     Physical Exam Constitutional:      General: He is not in acute distress. HENT:     Head: Normocephalic and atraumatic.  Eyes:     Extraocular Movements: Extraocular movements intact.     Conjunctiva/sclera: Conjunctivae normal.     Pupils: Pupils are equal, round, and reactive to light.  Cardiovascular:  Rate and Rhythm: Normal rate and regular rhythm.     Pulses: Normal pulses.     Heart sounds: Normal heart sounds. No murmur heard. Pulmonary:     Effort: Pulmonary effort is normal.     Breath sounds: Decreased breath sounds present. No wheezing, rhonchi or rales.  Abdominal:     General: Bowel sounds are normal.     Palpations: Abdomen is soft.  Musculoskeletal:     Right lower leg: No edema.     Left lower leg: No edema.  Lymphadenopathy:     Cervical: No cervical adenopathy.  Skin:    General: Skin is warm and dry.  Neurological:     General: No focal deficit present.     Mental Status: He is alert.  Psychiatric:        Mood and Affect: Mood normal.        Behavior: Behavior normal.        Thought Content: Thought content normal.        Judgment: Judgment normal.    CBC    Component Value Date/Time   WBC 5.2 02/06/2021 1030   RBC 5.56 02/06/2021 1030   HGB 14.4 02/06/2021 1030   HCT 44.6 02/06/2021 1030   PLT 177.0 02/06/2021 1030   MCV 80.3 02/06/2021 1030   MCH 26.4 02/15/2020 0624   MCHC 32.3 02/06/2021 1030   RDW 15.8 (H) 02/06/2021 1030   LYMPHSABS 1.3 02/06/2021 1030   MONOABS 0.6 02/06/2021 1030   EOSABS 0.2 02/06/2021 1030   BASOSABS 0.0 02/06/2021 1030     Chest imaging: CXR 01/16/21 Interstitial markings are diffusely coarsened with chronic features. Subtle patchy ground-glass opacity in both lung bases is new in the interval. No pneumothorax or pleural effusion. The cardiopericardial silhouette is within normal limits for size. The visualized  bony structures of the thorax show no acute abnormality.  PFT: PFT Results Latest Ref Rng & Units 03/27/2020  FVC-Pre L 2.67  FVC-Predicted Pre % 56  FVC-Post L 2.95  FVC-Predicted Post % 62  Pre FEV1/FVC % % 87  Post FEV1/FCV % % 88  FEV1-Pre L 2.31  FEV1-Predicted Pre % 66  FEV1-Post L 2.59  DLCO uncorrected ml/min/mmHg 19.33  DLCO UNC% % 70  DLCO corrected ml/min/mmHg 19.02  DLCO COR %Predicted % 69  DLVA Predicted % 105  TLC L 5.40  TLC % Predicted % 73  RV % Predicted % 79  PFT 2021: Mild restrictive defect and mild diffusion defect  Echo 2021: LVEF 60 to 65%.  Grade 2 diastolic dysfunction.  RV function is normal and RV size is normal.  No valvular disorders.  Assessment & Plan:   Shortness of breath - Plan: budesonide (PULMICORT) 0.5 MG/2ML nebulizer solution, Pulmonary Function Test  Fatigue, unspecified type - Plan: Home sleep test  Interstitial pulmonary disease ([place]) - Plan: CT Chest High Resolution  Discussion: Daquarius Fitzsimmons is a 70 year old male, former smoker with coronary artery disease, chronic kidney disease and GERD who returns to pulmonary clinic to establish care with new pulmonologist for ongoing dyspnea after COVID-19 infection.  He appears to have interstitial lung disease secondary to his history of smoking which was then progressed by his COVID-19 pneumonia in 2021.  His chest x-ray from July 2022 shows increased interstitial markings and his pulmonary function tests from 2021 show mild restrictive defect as well as mild diffusion defect.  Going dyspnea is likely secondary to the restrictive and diffusion defects along with diastolic heart failure  as noted on his echo from 2021.  He has lower extremity edema on exam today.  We will repeat pulmonary function tests as well as high-resolution CT scan to evaluate his lung parenchyma further.  He is to continue albuterol nebulizer treatments 3 times per day.  We will start him on budesonide nebulizer  treatments twice daily.  We will schedule him for a home sleep study to evaluate sleep apnea for his ongoing fatigue and to check his nighttime oxygen saturations.  Patient is to follow-up in 3 months.  Freda Jackson, MD [facility] Office: [phone]     Current Outpatient Medications:    albuterol (PROVENTIL) (2.5 MG/3ML) 0.083% nebulizer solution, Take 3 mLs (2.5 mg total) by nebulization every 6 (six) hours as needed for wheezing or shortness of breath., Disp: 75 mL, Rfl: 12   budesonide (PULMICORT) 0.5 MG/2ML nebulizer solution, Take 2 mLs (0.5 mg total) by nebulization 2 (two) times daily., Disp: 120 mL, Rfl: 11   lansoprazole (PREVACID) 30 MG capsule, Take 1 capsule (30 mg total) by mouth daily at 12 noon., Disp: 90 capsule, Rfl: 3   Psyllium (METAMUCIL FREE & NATURAL) 43 % POWD, Take 2 mLs by mouth daily. Metamucil with coffee, Disp: , Rfl:    vitamin B-12 (CYANOCOBALAMIN) 1000 MCG tablet, Take 1,000 mcg by mouth daily. , Disp: , Rfl:    albuterol (VENTOLIN HFA) 108 (90 Base) MCG/ACT inhaler, Inhale 2 puffs into the lungs every 6 (six) hours as needed for wheezing or shortness of breath. (Patient not taking: Reported on 02/19/2021), Disp: 18 g, Rfl: 1

## 2021-02-19 NOTE — Patient Instructions (Signed)
Start budesonide nebulizer treatments twice daily  Continue albuterol nebulizer treatments 3 times daily  We will repeat pulmonary function tests and check a high resolution CT chest scan  We will schedule you for a home sleep study.

## 2021-02-20 ENCOUNTER — Encounter: Payer: Self-pay | Admitting: Pulmonary Disease

## 2021-02-24 ENCOUNTER — Telehealth: Payer: Self-pay | Admitting: Pulmonary Disease

## 2021-02-24 DIAGNOSIS — R0602 Shortness of breath: Secondary | ICD-10-CM

## 2021-02-24 NOTE — Telephone Encounter (Signed)
ATC pharmacy unable to reach they are back after 2pm

## 2021-02-28 MED ORDER — BUDESONIDE 0.5 MG/2ML IN SUSP
0.5000 mg | Freq: Two times a day (BID) | RESPIRATORY_TRACT | 11 refills | Status: DC
Start: 2021-02-28 — End: 2022-05-04

## 2021-02-28 NOTE — Telephone Encounter (Signed)
Spoke with pt who states never being able to get Budesonide nebulizer. Pt also c/o SOB that has not gotten any worse since April 2022, pt stated SOB improved somewhat. O2 saturations stay between 98-100% on RA. Spoke with [facility] who stated they needed another budesonide script with Dx code on it to fill medication. New script sent to [facility] with Dx: J41.1 code included.  Routing to Dr. Erin Fulling as Juluis Rainier. Nothing further needed at this time.

## 2021-03-08 ENCOUNTER — Ambulatory Visit
Admission: RE | Admit: 2021-03-08 | Discharge: 2021-03-08 | Disposition: A | Discharge status: Home / Self Care | Payer: Medicare Other | Source: Ambulatory Visit | Attending: Pulmonary Disease | Admitting: Pulmonary Disease

## 2021-03-08 DIAGNOSIS — J189 Pneumonia, unspecified organism: Secondary | ICD-10-CM | POA: Diagnosis not present

## 2021-03-08 DIAGNOSIS — I700 Atherosclerosis of aorta: Secondary | ICD-10-CM | POA: Diagnosis not present

## 2021-03-08 DIAGNOSIS — R0602 Shortness of breath: Secondary | ICD-10-CM | POA: Diagnosis not present

## 2021-03-08 DIAGNOSIS — J849 Interstitial pulmonary disease, unspecified: Secondary | ICD-10-CM

## 2021-03-10 ENCOUNTER — Telehealth: Payer: Self-pay | Admitting: Pulmonary Disease

## 2021-03-11 ENCOUNTER — Other Ambulatory Visit: Payer: Self-pay | Admitting: Internal Medicine

## 2021-03-11 DIAGNOSIS — K219 Gastro-esophageal reflux disease without esophagitis: Secondary | ICD-10-CM | POA: Diagnosis not present

## 2021-03-11 DIAGNOSIS — Z87442 Personal history of urinary calculi: Secondary | ICD-10-CM | POA: Diagnosis not present

## 2021-03-11 DIAGNOSIS — E785 Hyperlipidemia, unspecified: Secondary | ICD-10-CM | POA: Diagnosis not present

## 2021-03-11 DIAGNOSIS — N183 Chronic kidney disease, stage 3 unspecified: Secondary | ICD-10-CM | POA: Diagnosis not present

## 2021-03-11 DIAGNOSIS — R3911 Hesitancy of micturition: Secondary | ICD-10-CM | POA: Diagnosis not present

## 2021-03-11 NOTE — Telephone Encounter (Signed)
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  Has he noticed any benefit since starting the budesonide nebulizer treatments?  Thanks, Wille Glaser

## 2021-03-11 NOTE — Telephone Encounter (Signed)
Called and spoke with pt. Pt is requesting to know the results of recent test. Saw that pt had a HRCT performed 03/08/21. Dr. Erin Fulling, please advise.

## 2021-03-12 MED ORDER — BREZTRI AEROSPHERE 160-9-4.8 MCG/ACT IN AERO: 2.0000 | INHALATION_SPRAY | Freq: Two times a day (BID) | RESPIRATORY_TRACT | 11 refills | Status: DC

## 2021-03-12 NOTE — Telephone Encounter (Signed)
I called and spoke with patient regarding CT and Dr. Walden Field. Patient verbalized understanding and stated the nebs do help short-term but he becomes winded again, esp with any exertion. Will route back to [place].  Dr. Erin Fulling, please advise. Thanks!

## 2021-03-12 NOTE — Telephone Encounter (Signed)
Spoke with the pt  Notified of response  He wants to try [facility]  Rx sent to preferred pharm and he is aware to call if any issues obtaining med

## 2021-03-12 NOTE — Telephone Encounter (Signed)
Please order brovana BID and yupelri daily nebs to be used with his budesonide.  If he would like to try an inhaler with the 3 medications instead, then send in prescription for breztri or trelegy.   We will await the sleep study results.  Thanks, Wille Glaser

## 2021-03-14 ENCOUNTER — Telehealth: Payer: Self-pay | Admitting: Pulmonary Disease

## 2021-03-14 ENCOUNTER — Ambulatory Visit
Admission: RE | Admit: 2021-03-14 | Discharge: 2021-03-14 | Disposition: A | Discharge status: Home / Self Care | Payer: Medicare Other | Source: Ambulatory Visit | Attending: Internal Medicine | Admitting: Internal Medicine

## 2021-03-14 DIAGNOSIS — N183 Chronic kidney disease, stage 3 unspecified: Secondary | ICD-10-CM | POA: Diagnosis not present

## 2021-03-14 NOTE — Telephone Encounter (Signed)
Called and spoke with patient who states that pharamcy told him that the Anthony King was going to be $383 and that is to expensive. Patient would like to be switched to something else if possible. Advised him there is patient assistance paperwork if he was interested. He said to check with you and go from there.  Please advise if you would like to switch or me mail patient assistance papers.

## 2021-03-17 NOTE — Telephone Encounter (Signed)
Please mail in patient assistance papers. Patient can have a sample from our office if he would like.  Anthony King

## 2021-03-18 NOTE — Telephone Encounter (Signed)
ATC patient to let him know that Dr. Erin Fulling would like him to do patient assistance paperwork and if he needs samples to call the office and let us know. Per DPR left detailed message for patient. Application has been mailed to patient. Advised him to call if he needs samples.

## 2021-03-21 IMAGING — DX DG CHEST 2V
2 series · 2 of 2 positions shown · non-contrast
Comparison: 11/07/2019.

CLINICAL DATA: History of COVID pneumonia.

EXAM:
CHEST - 2 VIEW

[chest pa]
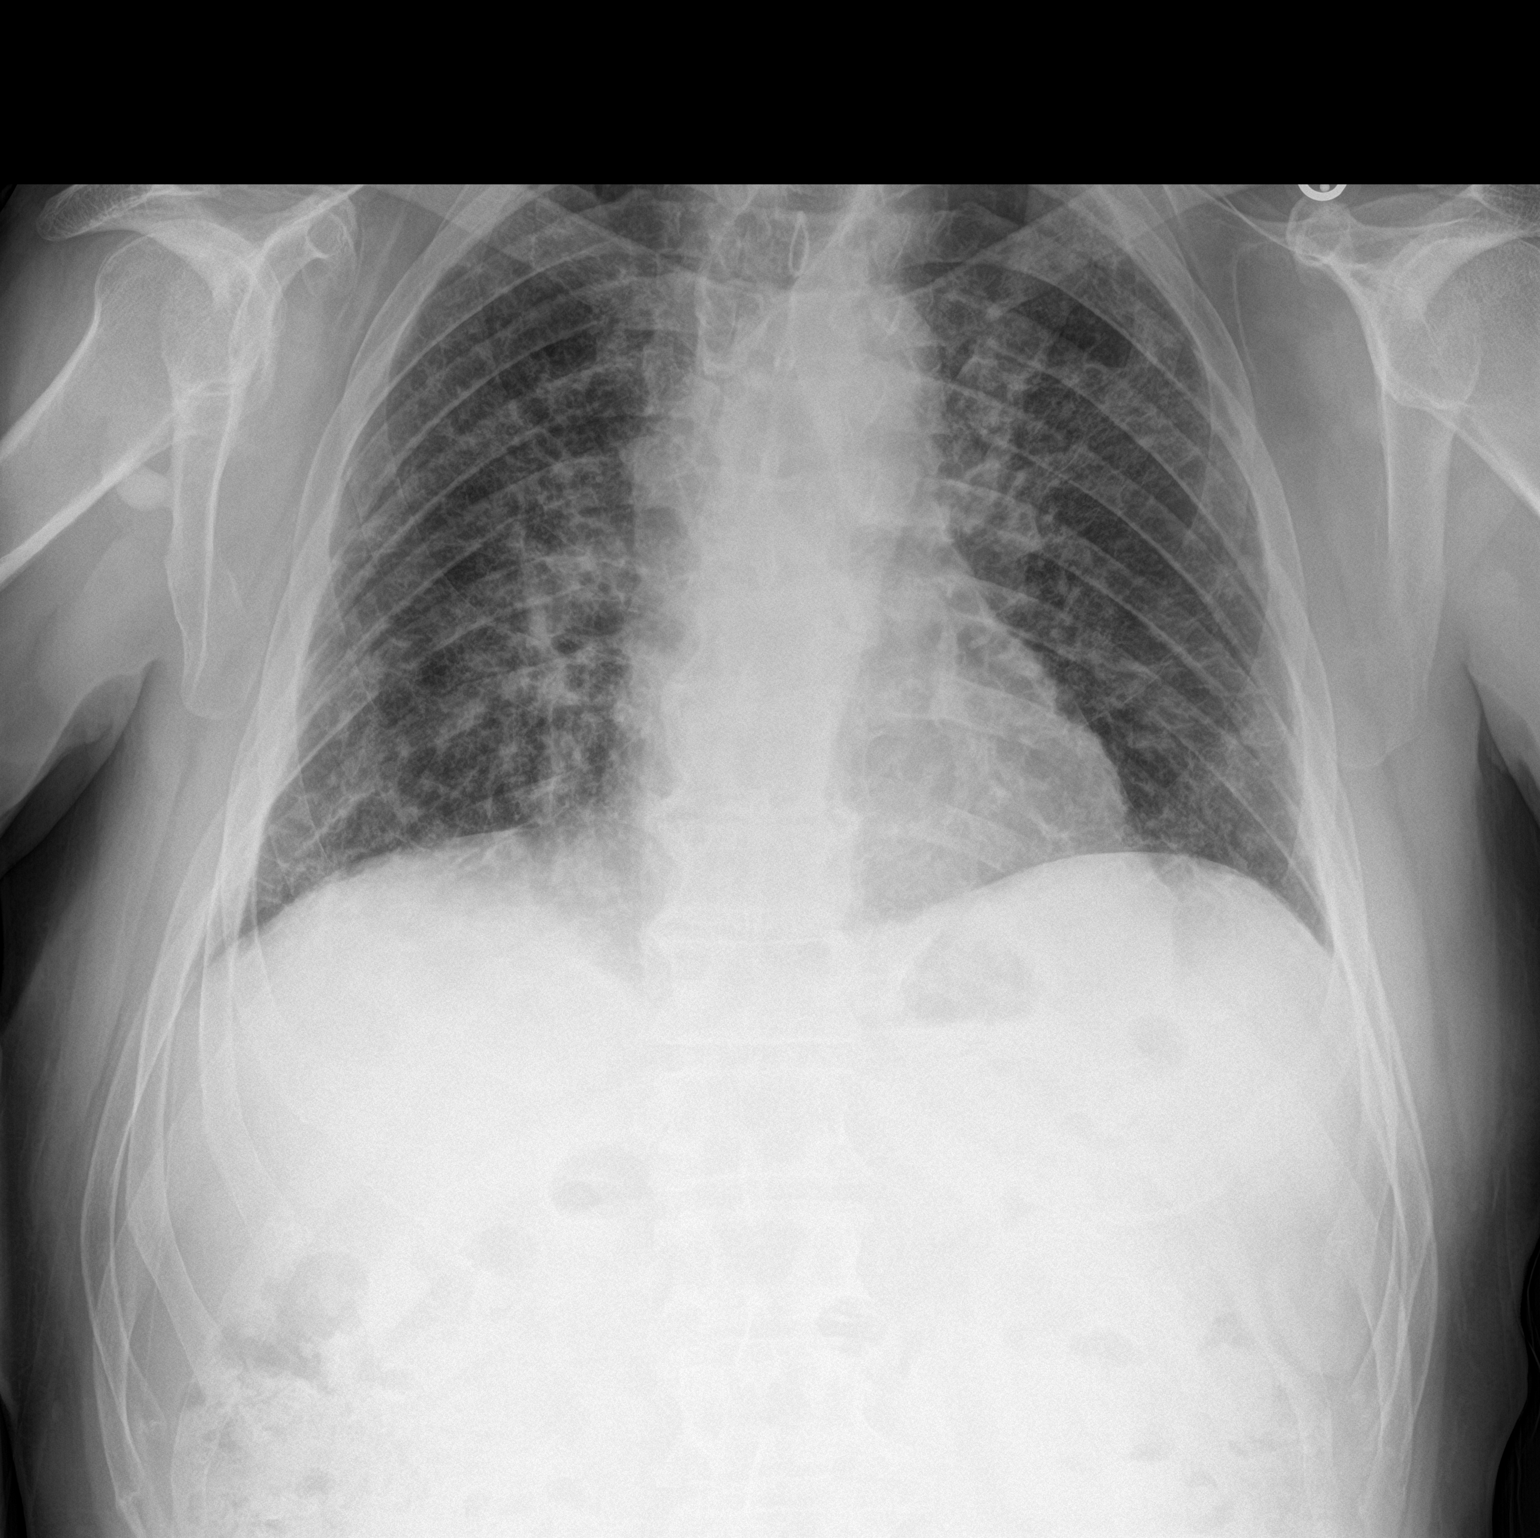

[chest lat]
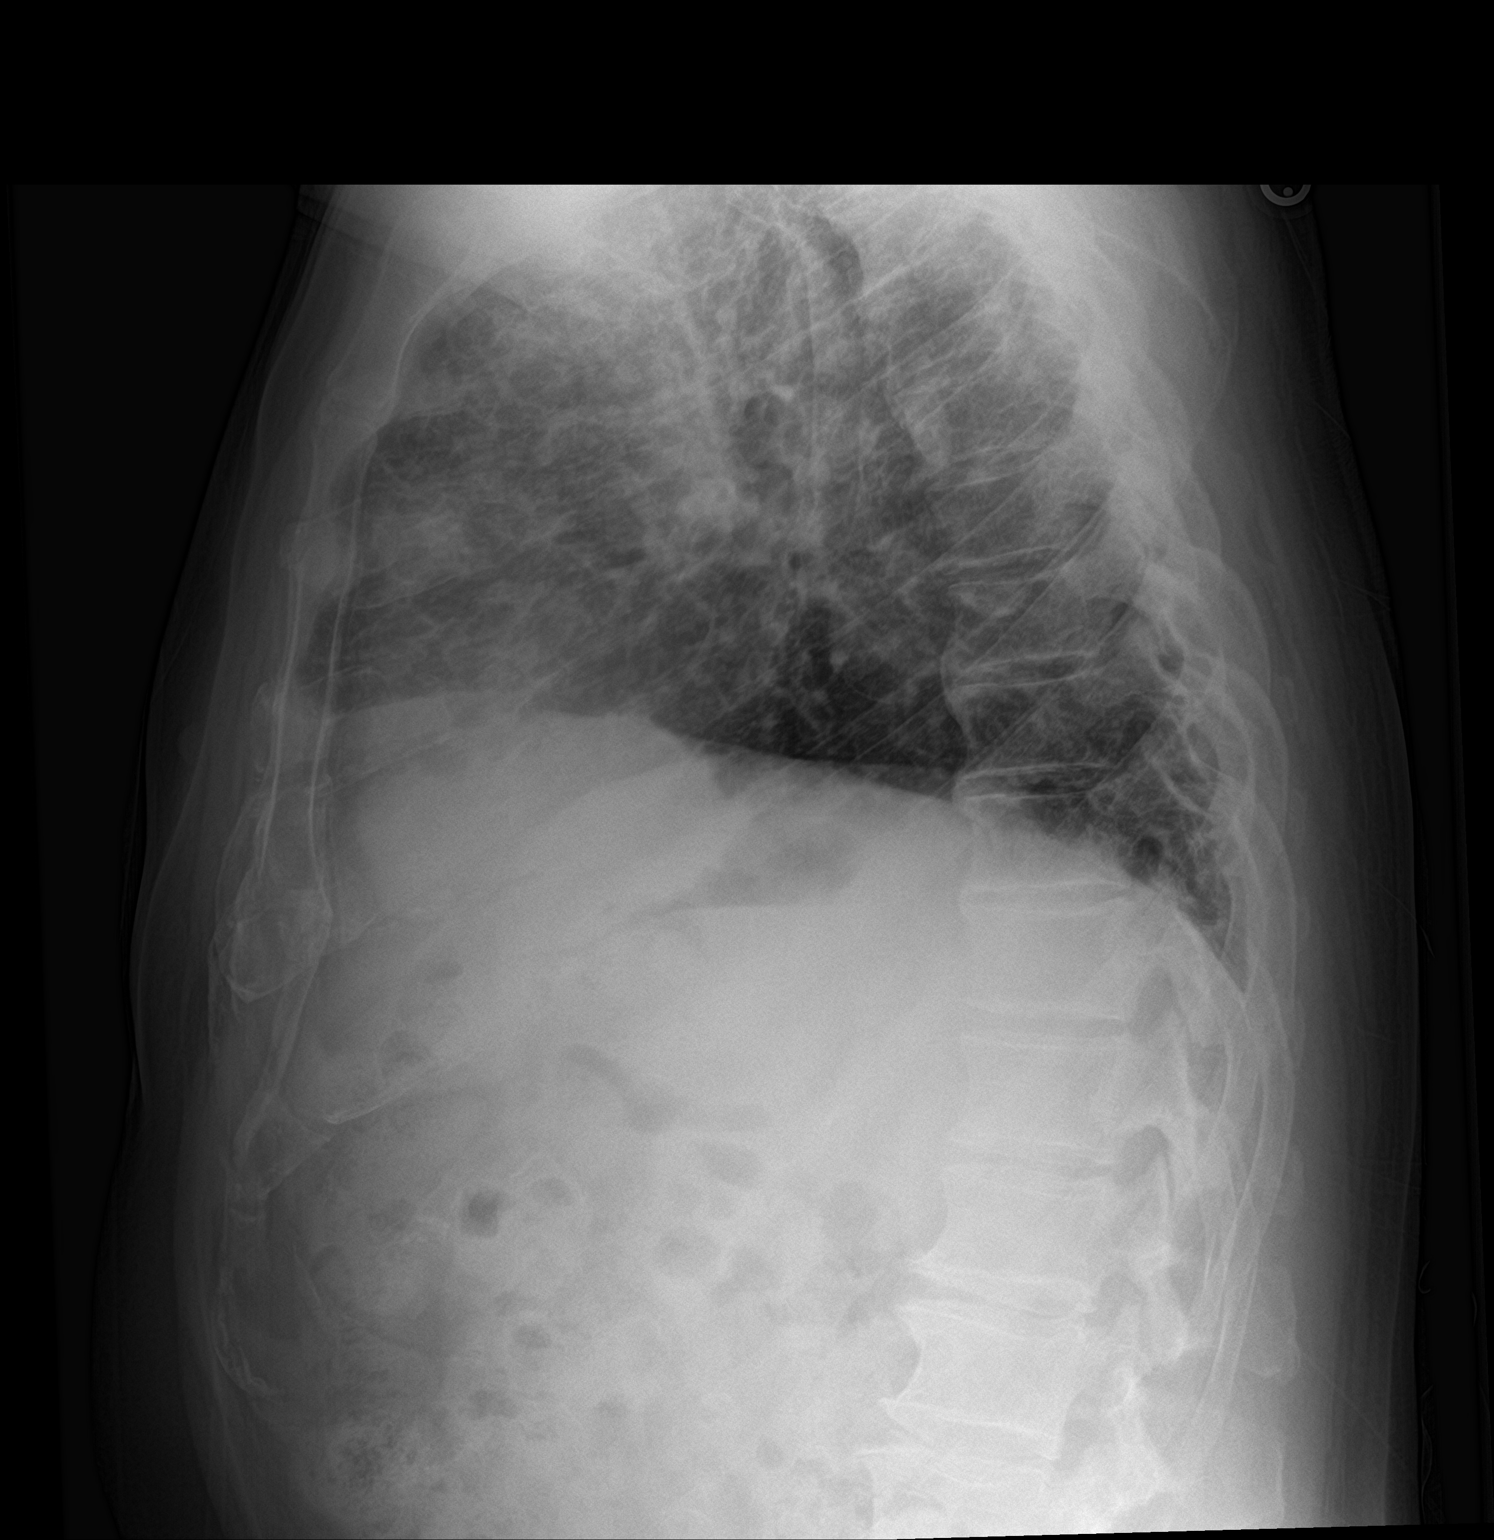

[2 of 2 positions shown; findings below may reference images not displayed]

FINDINGS: Mediastinum hilar structures are unremarkable. Heart size normal.
Diffuse bilateral interstitial prominence again noted. Slight
interim improvement from prior exam. No pleural effusion or
pneumothorax. Exostosis right scapula again noted. Degenerative
change thoracic spine.
IMPRESSION: Diffuse bilateral interstitial prominence consistent with
pneumonitis again noted. Slight improvement from prior exam.

## 2021-03-24 ENCOUNTER — Telehealth: Payer: Self-pay | Admitting: Pulmonary Disease

## 2021-03-24 NOTE — Telephone Encounter (Signed)
Called and spoke with patient to let him know that we currently don't have any samples and that I would touch base with him tomorrow if we had any. Asked him if he got the patient assistance paperwork. He said yes, I advised him to fill out his part and bring it into the office so we can get it faxed in for him. Patient expressed understanding.

## 2021-03-25 NOTE — Telephone Encounter (Signed)
ATC patient to let him know that we got some Breztri samples and that I will leave them up front for him. Advised patient to bring in patient assistance paperwork to drop off when he picks up his samples so that we can get it faxed in for him. Samples placed up front. Nothing further needed at this time.

## 2021-04-08 ENCOUNTER — Other Ambulatory Visit: Payer: Self-pay | Admitting: *Deleted

## 2021-04-08 DIAGNOSIS — J411 Mucopurulent chronic bronchitis: Secondary | ICD-10-CM

## 2021-04-08 MED ORDER — ALBUTEROL SULFATE (2.5 MG/3ML) 0.083% IN NEBU: 2.5000 mg | INHALATION_SOLUTION | Freq: Four times a day (QID) | RESPIRATORY_TRACT | 12 refills | Status: DC | PRN

## 2021-04-24 ENCOUNTER — Other Ambulatory Visit: Payer: Self-pay

## 2021-04-24 ENCOUNTER — Ambulatory Visit: Payer: Medicare Other

## 2021-04-24 DIAGNOSIS — G4733 Obstructive sleep apnea (adult) (pediatric): Secondary | ICD-10-CM | POA: Diagnosis not present

## 2021-04-24 DIAGNOSIS — R5383 Other fatigue: Secondary | ICD-10-CM

## 2021-05-05 ENCOUNTER — Telehealth: Payer: Self-pay | Admitting: Pulmonary Disease

## 2021-05-05 NOTE — Telephone Encounter (Signed)
Received a copy of patient's jury summons. Patient is asking to be exempt from jury duty due to his lung condition. He was last seen on 02/19/21 by Dr. Erin Fulling.   Dr. Erin Fulling, would you be ok with excusing him from jury duty? I will place the summons in your look-at folder.

## 2021-05-06 NOTE — Telephone Encounter (Signed)
Pt called and left me a vm.  He thought I had called him.

## 2021-05-06 NOTE — Telephone Encounter (Signed)
Letter has been signed by JD. I called the patient to make him aware but he did not answer. I left a message for him to call back. I will keep the letter until I know if he wants to come by the office or have [place] to mail the letter to him.

## 2021-05-07 NOTE — Telephone Encounter (Signed)
Will place letter at the front desk for patient to pick up.

## 2021-05-13 ENCOUNTER — Telehealth: Payer: Self-pay | Admitting: Pulmonary Disease

## 2021-05-13 DIAGNOSIS — G4733 Obstructive sleep apnea (adult) (pediatric): Secondary | ICD-10-CM

## 2021-05-13 NOTE — Telephone Encounter (Signed)
Called and spoke to patient about Dr. Lisabeth Pick recommendations. He is unsre if he wants to start cpap therapy at this time. He said he would like to take a few days to think about it and talk it over with his wife and then he will give the office a call back and let us know if he would like to start the cpap therapy or not. Routing to Dr. Erin Fulling as an Juluis Rainier.

## 2021-05-13 NOTE — Telephone Encounter (Signed)
HST showed severe OSA AHI 33/h Suggest autoCPAP

## 2021-05-13 NOTE — Telephone Encounter (Signed)
Please let patient know that his home sleep study showed severe obstructive sleep apnea. He would benefit from CPAP therapy. We can order him an auto-titrating machine 5-15cmH2O with humidification along with a mask fitting session if he would like to start treatment.   Treating hi sleep apnea may help with some of the shortness of breath he is experiencing during the day.   Thanks, Wille Glaser

## 2021-05-15 NOTE — Telephone Encounter (Signed)
Patient called the office back and he decided to try the CPAP machine. Order placed.

## 2021-05-29 IMAGING — DX DG CHEST 2V
2 series · 2 of 2 positions shown · non-contrast
Comparison: Chest radiograph dated 11/23/2019.

CLINICAL DATA: 69-year-old male with shortness of breath

EXAM:
CHEST - 2 VIEW

[chest pa]
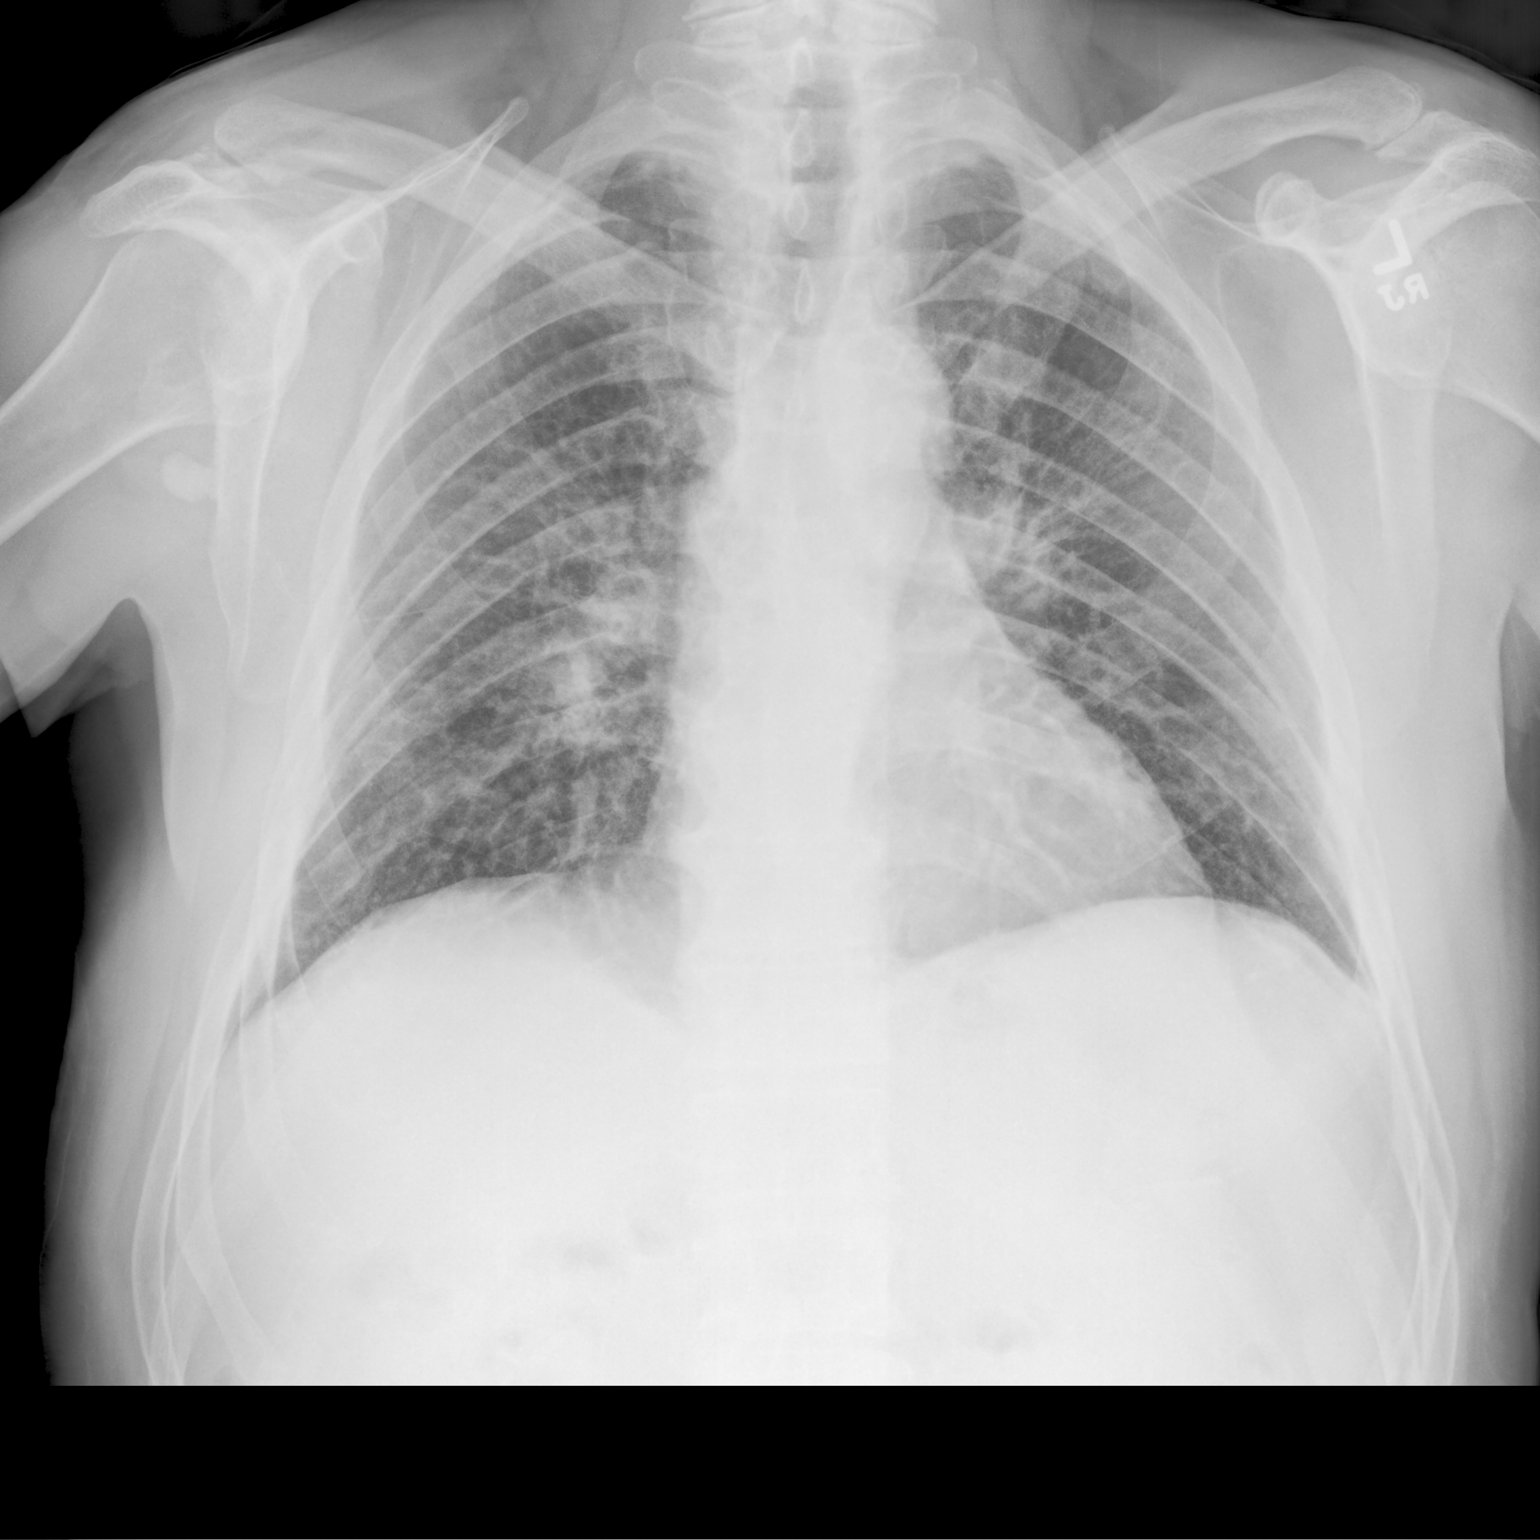

[chest lat]
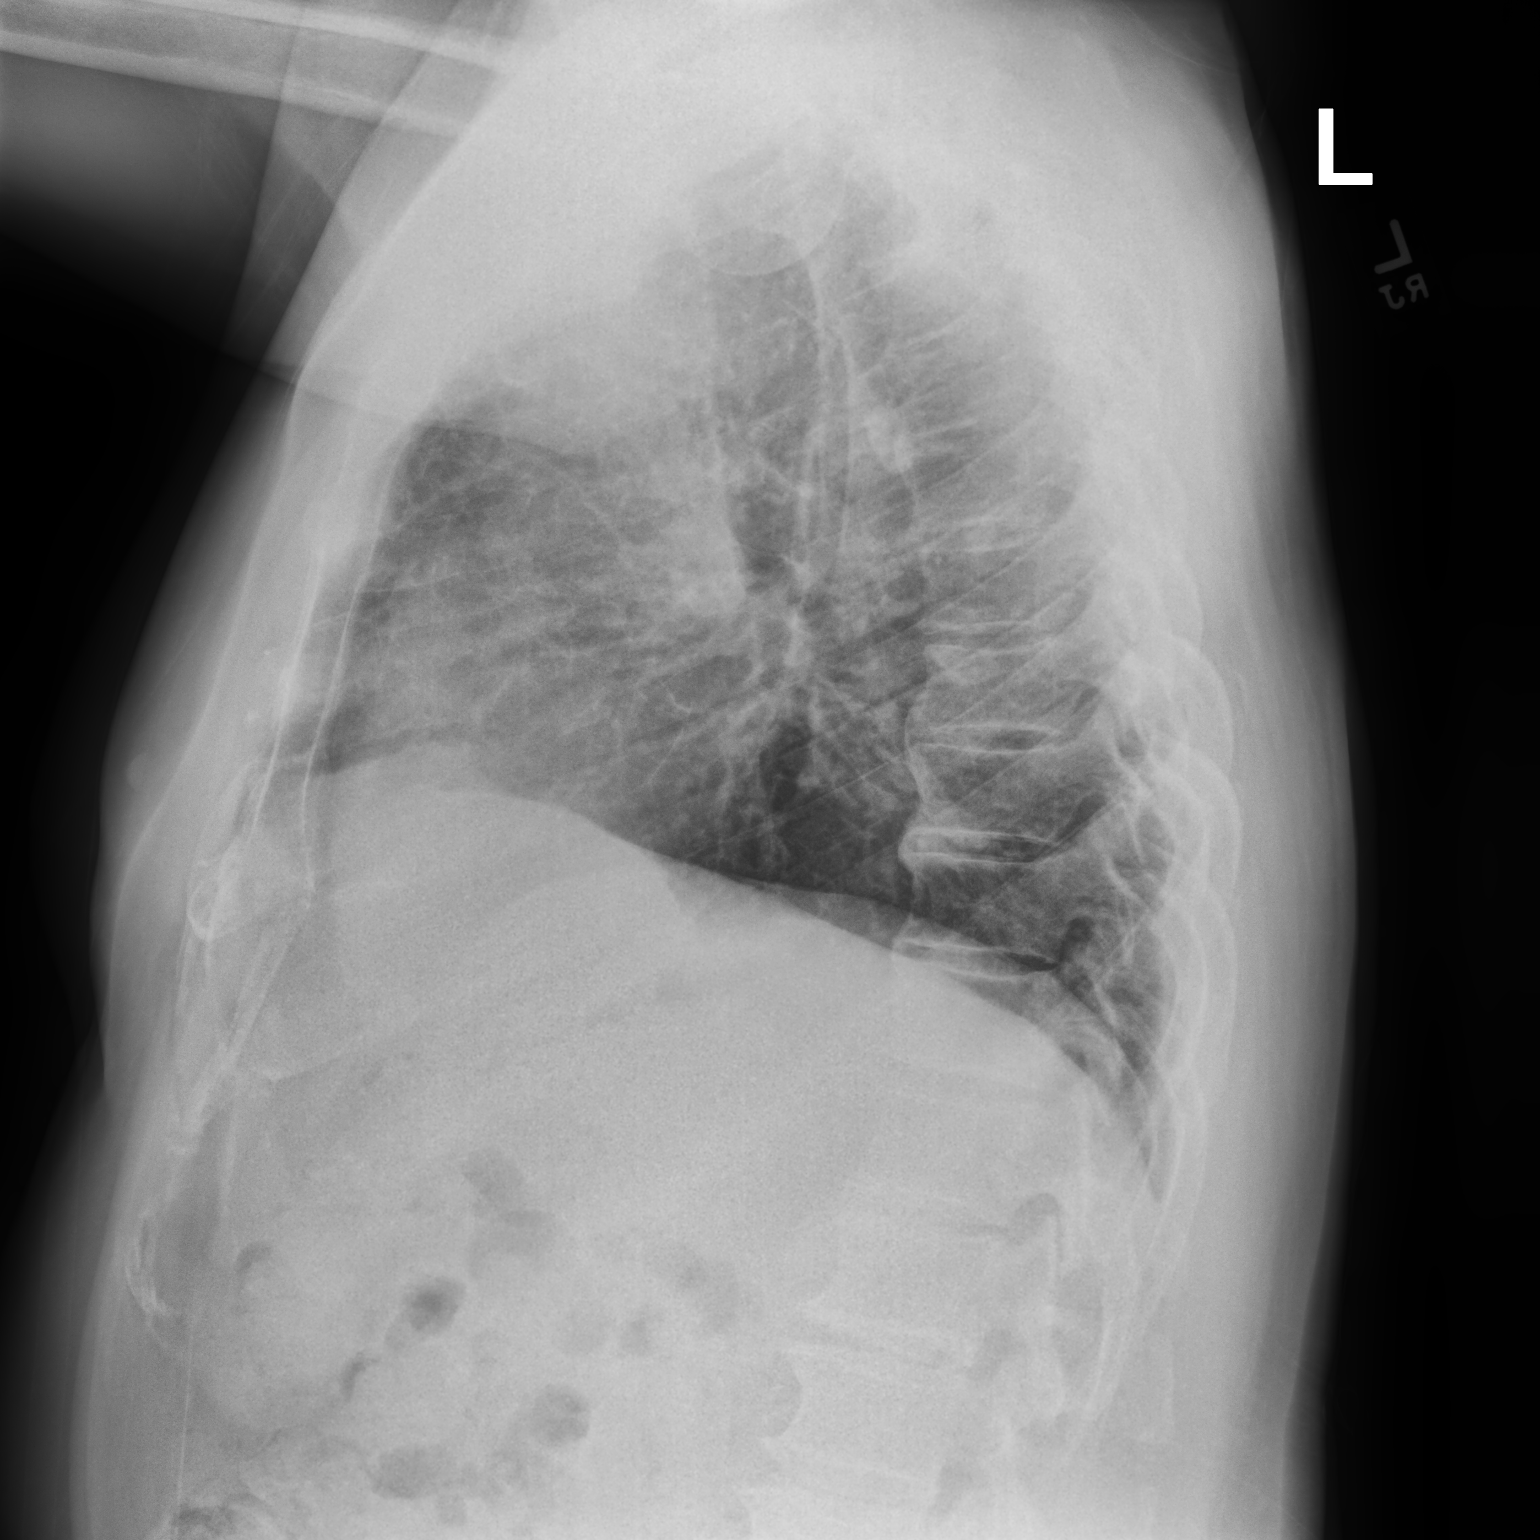

[2 of 2 positions shown; findings below may reference images not displayed]

FINDINGS: Diffuse interstitial and peribronchial densities may represent
reactive airway disease or viral/atypical infection. Clinical
correlation is recommended. No focal consolidation, pleural
effusion, or pneumothorax. The cardiac silhouette is within limits.
No acute osseous pathology.
IMPRESSION: Findings likely represent atypical infection, possibly viral in
etiology. Clinical correlation recommended. No focal consolidation.

## 2021-07-08 DIAGNOSIS — M25511 Pain in right shoulder: Secondary | ICD-10-CM | POA: Diagnosis not present

## 2021-07-11 DIAGNOSIS — M25511 Pain in right shoulder: Secondary | ICD-10-CM | POA: Diagnosis not present

## 2021-07-15 DIAGNOSIS — M25511 Pain in right shoulder: Secondary | ICD-10-CM | POA: Diagnosis not present

## 2021-07-19 DIAGNOSIS — S61211S Laceration without foreign body of left index finger without damage to nail, sequela: Secondary | ICD-10-CM | POA: Diagnosis not present

## 2021-08-01 ENCOUNTER — Telehealth: Payer: Self-pay | Admitting: Pulmonary Disease

## 2021-08-01 NOTE — Telephone Encounter (Signed)
Tried to call patient and let him know that he did not need to bring cpap to visit we are able to get a download on airview. But was unable to leave message. Phone seems to be a fax number.

## 2021-08-11 ENCOUNTER — Encounter: Payer: Self-pay | Admitting: Pulmonary Disease

## 2021-08-11 ENCOUNTER — Ambulatory Visit (INDEPENDENT_AMBULATORY_CARE_PROVIDER_SITE_OTHER): Payer: Medicare Other | Admitting: Pulmonary Disease

## 2021-08-11 ENCOUNTER — Other Ambulatory Visit: Payer: Self-pay

## 2021-08-11 VITALS — BP 122/70 | HR 66 | Ht 70.0 in | Wt 225.2 lb

## 2021-08-11 DIAGNOSIS — J849 Interstitial pulmonary disease, unspecified: Secondary | ICD-10-CM | POA: Diagnosis not present

## 2021-08-11 DIAGNOSIS — R0602 Shortness of breath: Secondary | ICD-10-CM

## 2021-08-11 DIAGNOSIS — G4733 Obstructive sleep apnea (adult) (pediatric): Secondary | ICD-10-CM

## 2021-08-11 DIAGNOSIS — I5032 Chronic diastolic (congestive) heart failure: Secondary | ICD-10-CM | POA: Diagnosis not present

## 2021-08-11 NOTE — Patient Instructions (Signed)
We will schedule you for a CPAP titration study at the [facility].   We will schedule you for a Cardiopulmonary exercise test.   Ok to stop nebulizer treatments and inhaler therapy.   Follow up in 6 months

## 2021-08-11 NOTE — Progress Notes (Addendum)
Synopsis: Referred in June 2021 for post-covid19 dyspnea, previously followed by Dr. Sherene Sires  Subjective:   PATIENT ID: Anthony King GENDER: male DOB: 1951-02-21, MRN: [id number]  HPI  Chief Complaint  Patient presents with   Follow-up    F/U after starting CPAP. States the budesonide has not been working for him. Nor the albuterol neb solution.    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   He was diagnosed with severe OSA last fall and recently received his CPAP machine. He is having difficulty with the mask fit and pressure setting. He is using his CPAP machine nightly with 70% of those nights with 4 hours or greater usage. He still has an AHI of 10.3, with a mix of central and obstructive apneas occurring.  He continues to have significant exertional dyspnea. He does not report any improvements in his breathing with breztri or nebulizer treatments.   OV 02/19/21 He had COVID in April 2021 reports having shortness of breath and fatigue since he was hospitalized for the viral infection.  He is currently using albuterol nebulizer treatments 3 times per day with relief.  He was not able to obtain Symbicort inhaler as it cost $380.  Prior to COVID-19 he did not have any issues with shortness of breath.  He denies any seasonal issues with his breathing.  Hot humid weather does bother his breathing.  He is a former smoker he quit in 1992.  He started smoking at 71 years old and never smoked more than a pack per day.  He has worked as a Engineer, manufacturing and denies any dust or harmful chemical exposures.  He complains of significant fatigue since his infection last year.  He does not sleep well.  No history of witnessed apneas.  Past Medical History:  Diagnosis Date   Aortic atherosclerosis (HCC) 03/19/2018   Arthritis 07-17-11   hands,  hips, knees   Basal cell carcinoma    nose   CAD (coronary artery disease), native coronary artery 03/19/2018   Chronic kidney disease 07-17-11   past kidney stone x1   CKD (chronic kidney disease), stage III (HCC) 03/18/2018   Colon polyps    ADENOMATOUS AND HYPERPLASTIC POLYPS   COVID-19    Diverticulosis    DOE (dyspnea on exertion) 12/07/2019   Onset with covid 19 pna  - see adimit 10/18/19  -  12/07/2019   Walked RA  3 laps @ approx 2107ft each @ slow slt unsteady  pace  stopped due to sob / weakness but sats never less than 92%    Dyspnea    with exertion   Gastric ulcer    GERD (gastroesophageal reflux disease)    Hemorrhoids    History of kidney stones    Hyperlipidemia    Hypoxia 10/18/2019   Left lower quadrant abdominal pain 05/31/2018   Pneumonia    Pre-diabetes    Prediabetes 03/19/2018   Unstable angina (HCC) 03/19/2018   Wears glasses      Family History  Problem Relation Age of Onset   Stomach cancer Father    Colon cancer Father    Stomach cancer Sister        Passed away 2023-07-06   Arthritis Mother    Breast cancer Mother    Pneumonia Mother    Alzheimer's disease Mother    Arthritis Paternal Grandmother  Arthritis Maternal Grandmother    Colon cancer Brother    Esophageal cancer Neg Hx    Rectal cancer Neg Hx      Social History   Socioeconomic History   Marital status: Married    Spouse name: Not on file   Number of children: 1   Years of education: Not on file   Highest education level: Not on file  Occupational History   Occupation: self-employed , covers furniture  Tobacco Use   Smoking status: Former    Types: Cigarettes    Start date: 1963    Quit date: 07/02/1991    Years since quitting: 30.1   Smokeless tobacco: Never  Vaping Use   Vaping Use: Never used  Substance and Sexual Activity   Alcohol use: No   Drug use: No   Sexual activity: Yes  Other Topics Concern   Not on file  Social History Narrative   Exercise- no   Right handed    No caffeine   One story home   Social Determinants of Health   Financial Resource Strain: Not on file  Food Insecurity: No Food Insecurity   Worried About Charity fundraiser in the Last Year: Never true   [place] in the Last Year: Never true  Transportation Needs: No Transportation Needs   Lack of Transportation (Medical): No   Lack of Transportation (Non-Medical): No  Physical Activity: Not on file  Stress: Not on file  Social Connections: Not on file  Intimate Partner Violence: Not on file     Allergies  Allergen Reactions   Aspirin Other (See Comments)    REACTION: nauseated, drooling     Outpatient Medications Prior to Visit  Medication Sig Dispense Refill   albuterol (PROVENTIL) (2.5 MG/3ML) 0.083% nebulizer solution Take 3 mLs (2.5 mg total) by nebulization every 6 (six) hours as needed for wheezing or shortness of breath. 75 mL 12   albuterol (VENTOLIN HFA) 108 (90 Base) MCG/ACT inhaler Inhale 2 puffs into the lungs every 6 (six) hours as needed for wheezing or shortness of breath. 18 g 1   Budeson-Glycopyrrol-Formoterol (BREZTRI AEROSPHERE) 160-9-4.8 MCG/ACT AERO Inhale 2 puffs into the lungs in the morning and at bedtime. 10.7 g 11   budesonide (PULMICORT) 0.5 MG/2ML nebulizer solution Take 2 mLs (0.5 mg total) by nebulization 2 (two) times daily. 120 mL 11   lansoprazole (PREVACID) 30 MG capsule Take 1 capsule (30 mg total) by mouth daily at 12 noon. 90 capsule 3   Psyllium (METAMUCIL FREE & NATURAL) 43 % POWD Take 2 mLs by mouth daily. Metamucil with coffee     vitamin B-12 (CYANOCOBALAMIN) 1000 MCG tablet Take 1,000 mcg by mouth daily.      No facility-administered medications prior to visit.    Review of Systems  Constitutional:  Positive for malaise/fatigue. Negative for chills, fever and weight loss.  HENT:  Negative for congestion, sinus pain and sore throat.   Eyes: Negative.   Respiratory:  Positive for shortness of breath. Negative for cough,  hemoptysis, sputum production and wheezing.   Cardiovascular:  Negative for chest pain, palpitations, orthopnea, claudication and leg swelling.  Gastrointestinal:  Negative for abdominal pain, heartburn, nausea and vomiting.  Genitourinary: Negative.   Musculoskeletal:  Negative for joint pain and myalgias.  Skin:  Negative for rash.  Neurological:  Negative for weakness.  Endo/Heme/Allergies: Negative.   Psychiatric/Behavioral: Negative.       Objective:   Vitals:   08/11/21 1056  BP: 122/70  Pulse: 66  SpO2: 99%  Weight: 225 lb 3.2 oz (102.2 kg)  Height: $Remove'5\' 10"'dPlyrbl$  (1.778 m)    Physical Exam Constitutional:      General: He is not in acute distress. HENT:     Head: Normocephalic and atraumatic.  Eyes:     Conjunctiva/sclera: Conjunctivae normal.  Cardiovascular:     Rate and Rhythm: Normal rate and regular rhythm.     Pulses: Normal pulses.     Heart sounds: Normal heart sounds. No murmur heard. Pulmonary:     Effort: Pulmonary effort is normal.     Breath sounds: Decreased breath sounds present. No wheezing, rhonchi or rales.  Musculoskeletal:     Right lower leg: No edema.     Left lower leg: No edema.  Skin:    General: Skin is warm and dry.  Neurological:     General: No focal deficit present.     Mental Status: He is alert.  Psychiatric:        Mood and Affect: Mood normal.        Behavior: Behavior normal.        Thought Content: Thought content normal.        Judgment: Judgment normal.    CBC    Component Value Date/Time   WBC 5.2 02/06/2021 1030   RBC 5.56 02/06/2021 1030   HGB 14.4 02/06/2021 1030   HCT 44.6 02/06/2021 1030   PLT 177.0 02/06/2021 1030   MCV 80.3 02/06/2021 1030   MCH 26.4 02/15/2020 0624   MCHC 32.3 02/06/2021 1030   RDW 15.8 (H) 02/06/2021 1030   LYMPHSABS 1.3 02/06/2021 1030   MONOABS 0.6 02/06/2021 1030   EOSABS 0.2 02/06/2021 1030   BASOSABS 0.0 02/06/2021 1030   Chest imaging: HRCT Chest  03/08/21 1. Pulmonary  parenchymal pattern fibrosis, together with air trapping, is indicative of post COVID-19 fibrosis. Findings are suggestive of an alternative diagnosis (not UIP) per consensus guidelines: Diagnosis of Idiopathic Pulmonary Fibrosis: An Official ATS/ERS/JRS/ALAT Clinical Practice Guideline. [place], Iss 5, [phone], Feb 27 2017. 2. Cholelithiasis. 3. Left renal stone. 4.  Aortic atherosclerosis  CXR 01/16/21 Interstitial markings are diffusely coarsened with chronic features. Subtle patchy ground-glass opacity in both lung bases is new in the interval. No pneumothorax or pleural effusion. The cardiopericardial silhouette is within normal limits for size. The visualized bony structures of the thorax show no acute abnormality.  PFT: PFT Results Latest Ref Rng & Units 03/27/2020  FVC-Pre L 2.67  FVC-Predicted Pre % 56  FVC-Post L 2.95  FVC-Predicted Post % 62  Pre FEV1/FVC % % 87  Post FEV1/FCV % % 88  FEV1-Pre L 2.31  FEV1-Predicted Pre % 66  FEV1-Post L 2.59  DLCO uncorrected ml/min/mmHg 19.33  DLCO UNC% % 70  DLCO corrected ml/min/mmHg 19.02  DLCO COR %Predicted % 69  DLVA Predicted % 105  TLC L 5.40  TLC % Predicted % 73  RV % Predicted % 79  PFT 2021: Mild restrictive defect and mild diffusion defect  HST 03/2021 Severe OSA with AHI of 33.2/hr  Echo 2021: LVEF 60 to 65%.  Grade 2 diastolic dysfunction.  RV function is normal and RV size is normal.  No valvular disorders.  Assessment & Plan:   Interstitial pulmonary disease ([place]) - Plan: Cardiopulmonary exercise test  OSA (obstructive sleep apnea) - Plan: Cpap titration  Shortness of breath - Plan: Cardiopulmonary exercise test  Chronic diastolic heart failure (  [place]) - Plan: Cardiopulmonary exercise test  Discussion: Anthony King is a 71 year old male, former smoker with coronary artery disease, chronic kidney disease and GERD who returns to pulmonary clinic for post-covid 19 pulmonary  fibrosis.   He appears to have interstitial lung disease secondary to his history of smoking which was then progressed by his COVID-19 pneumonia in 2021.  His chest x-ray from July 2022 shows increased interstitial markings and his pulmonary function tests from 2021 show mild restrictive defect as well as mild diffusion defect.  His ongoing dyspnea is likely secondary to the restrictive and diffusion defects along with diastolic heart failure as noted on his echo from 2021. He also has severe obstructive sleep apnea that is not adequately treated at this time which could be affecting his cardiac status.   We will schedule him for a CPAP titration study in order to determine adequate pressure settings as well as obtain a better mask fit. He has met his compliance requirements since receiving the machine.  He can stop inhaler and nebulizer treatments as they have not improved his symptoms.   Patient is to follow-up in 6 months.  Freda Jackson, MD [facility] Office: [phone]     Current Outpatient Medications:    albuterol (PROVENTIL) (2.5 MG/3ML) 0.083% nebulizer solution, Take 3 mLs (2.5 mg total) by nebulization every 6 (six) hours as needed for wheezing or shortness of breath., Disp: 75 mL, Rfl: 12   albuterol (VENTOLIN HFA) 108 (90 Base) MCG/ACT inhaler, Inhale 2 puffs into the lungs every 6 (six) hours as needed for wheezing or shortness of breath., Disp: 18 g, Rfl: 1   Budeson-Glycopyrrol-Formoterol (BREZTRI AEROSPHERE) 160-9-4.8 MCG/ACT AERO, Inhale 2 puffs into the lungs in the morning and at bedtime., Disp: 10.7 g, Rfl: 11   budesonide (PULMICORT) 0.5 MG/2ML nebulizer solution, Take 2 mLs (0.5 mg total) by nebulization 2 (two) times daily., Disp: 120 mL, Rfl: 11   lansoprazole (PREVACID) 30 MG capsule, Take 1 capsule (30 mg total) by mouth daily at 12 noon., Disp: 90 capsule, Rfl: 3   Psyllium (METAMUCIL FREE & NATURAL) 43 % POWD, Take 2 mLs by mouth  daily. Metamucil with coffee, Disp: , Rfl:    vitamin B-12 (CYANOCOBALAMIN) 1000 MCG tablet, Take 1,000 mcg by mouth daily. , Disp: , Rfl:

## 2021-08-19 ENCOUNTER — Encounter: Payer: Self-pay | Admitting: Pulmonary Disease

## 2021-08-20 ENCOUNTER — Other Ambulatory Visit: Payer: Self-pay

## 2021-08-20 ENCOUNTER — Ambulatory Visit (HOSPITAL_COMMUNITY): Payer: Medicare Other | Attending: Internal Medicine

## 2021-08-20 ENCOUNTER — Other Ambulatory Visit (HOSPITAL_COMMUNITY): Payer: Self-pay | Admitting: *Deleted

## 2021-08-20 DIAGNOSIS — C44311 Basal cell carcinoma of skin of nose: Secondary | ICD-10-CM | POA: Diagnosis not present

## 2021-08-20 DIAGNOSIS — J849 Interstitial pulmonary disease, unspecified: Secondary | ICD-10-CM | POA: Insufficient documentation

## 2021-08-20 DIAGNOSIS — R7303 Prediabetes: Secondary | ICD-10-CM | POA: Insufficient documentation

## 2021-08-20 DIAGNOSIS — M1990 Unspecified osteoarthritis, unspecified site: Secondary | ICD-10-CM | POA: Insufficient documentation

## 2021-08-20 DIAGNOSIS — R1032 Left lower quadrant pain: Secondary | ICD-10-CM | POA: Insufficient documentation

## 2021-08-20 DIAGNOSIS — I25110 Atherosclerotic heart disease of native coronary artery with unstable angina pectoris: Secondary | ICD-10-CM | POA: Insufficient documentation

## 2021-08-20 DIAGNOSIS — K5790 Diverticulosis of intestine, part unspecified, without perforation or abscess without bleeding: Secondary | ICD-10-CM | POA: Diagnosis not present

## 2021-08-20 DIAGNOSIS — K649 Unspecified hemorrhoids: Secondary | ICD-10-CM | POA: Insufficient documentation

## 2021-08-20 DIAGNOSIS — G4733 Obstructive sleep apnea (adult) (pediatric): Secondary | ICD-10-CM | POA: Insufficient documentation

## 2021-08-20 DIAGNOSIS — Z7951 Long term (current) use of inhaled steroids: Secondary | ICD-10-CM | POA: Insufficient documentation

## 2021-08-20 DIAGNOSIS — R0902 Hypoxemia: Secondary | ICD-10-CM | POA: Insufficient documentation

## 2021-08-20 DIAGNOSIS — R0609 Other forms of dyspnea: Secondary | ICD-10-CM | POA: Insufficient documentation

## 2021-08-20 DIAGNOSIS — Z79899 Other long term (current) drug therapy: Secondary | ICD-10-CM | POA: Insufficient documentation

## 2021-08-20 DIAGNOSIS — K219 Gastro-esophageal reflux disease without esophagitis: Secondary | ICD-10-CM | POA: Diagnosis not present

## 2021-08-20 DIAGNOSIS — K635 Polyp of colon: Secondary | ICD-10-CM | POA: Insufficient documentation

## 2021-08-20 DIAGNOSIS — I5032 Chronic diastolic (congestive) heart failure: Secondary | ICD-10-CM

## 2021-08-20 DIAGNOSIS — E785 Hyperlipidemia, unspecified: Secondary | ICD-10-CM | POA: Insufficient documentation

## 2021-08-20 DIAGNOSIS — N189 Chronic kidney disease, unspecified: Secondary | ICD-10-CM | POA: Diagnosis not present

## 2021-08-20 DIAGNOSIS — I700 Atherosclerosis of aorta: Secondary | ICD-10-CM | POA: Insufficient documentation

## 2021-08-20 DIAGNOSIS — U099 Post covid-19 condition, unspecified: Secondary | ICD-10-CM | POA: Diagnosis not present

## 2021-08-20 DIAGNOSIS — R0602 Shortness of breath: Secondary | ICD-10-CM | POA: Insufficient documentation

## 2021-08-20 DIAGNOSIS — R5383 Other fatigue: Secondary | ICD-10-CM | POA: Diagnosis not present

## 2021-09-12 ENCOUNTER — Encounter (HOSPITAL_BASED_OUTPATIENT_CLINIC_OR_DEPARTMENT_OTHER): Payer: Medicare Other | Admitting: Pulmonary Disease

## 2021-09-26 DIAGNOSIS — M79641 Pain in right hand: Secondary | ICD-10-CM | POA: Insufficient documentation

## 2021-09-26 DIAGNOSIS — M67441 Ganglion, right hand: Secondary | ICD-10-CM | POA: Diagnosis not present

## 2021-09-26 DIAGNOSIS — M6530 Trigger finger, unspecified finger: Secondary | ICD-10-CM | POA: Diagnosis not present

## 2021-10-08 DIAGNOSIS — M720 Palmar fascial fibromatosis [Dupuytren]: Secondary | ICD-10-CM | POA: Diagnosis not present

## 2021-10-11 DIAGNOSIS — R42 Dizziness and giddiness: Secondary | ICD-10-CM | POA: Diagnosis not present

## 2021-10-11 DIAGNOSIS — R509 Fever, unspecified: Secondary | ICD-10-CM | POA: Diagnosis not present

## 2021-10-11 DIAGNOSIS — Z20828 Contact with and (suspected) exposure to other viral communicable diseases: Secondary | ICD-10-CM | POA: Diagnosis not present

## 2021-10-11 DIAGNOSIS — R0981 Nasal congestion: Secondary | ICD-10-CM | POA: Diagnosis not present

## 2021-10-11 DIAGNOSIS — R051 Acute cough: Secondary | ICD-10-CM | POA: Diagnosis not present

## 2021-11-21 DIAGNOSIS — J209 Acute bronchitis, unspecified: Secondary | ICD-10-CM | POA: Diagnosis not present

## 2021-11-21 DIAGNOSIS — B029 Zoster without complications: Secondary | ICD-10-CM | POA: Diagnosis not present

## 2021-11-21 DIAGNOSIS — R059 Cough, unspecified: Secondary | ICD-10-CM | POA: Diagnosis not present

## 2021-11-21 DIAGNOSIS — J0100 Acute maxillary sinusitis, unspecified: Secondary | ICD-10-CM | POA: Diagnosis not present

## 2022-01-01 ENCOUNTER — Emergency Department (HOSPITAL_BASED_OUTPATIENT_CLINIC_OR_DEPARTMENT_OTHER): Payer: Medicare Other

## 2022-01-01 ENCOUNTER — Encounter (HOSPITAL_BASED_OUTPATIENT_CLINIC_OR_DEPARTMENT_OTHER): Payer: Self-pay | Admitting: Emergency Medicine

## 2022-01-01 ENCOUNTER — Emergency Department (HOSPITAL_BASED_OUTPATIENT_CLINIC_OR_DEPARTMENT_OTHER)
Admission: EM | Admit: 2022-01-01 | Discharge: 2022-01-01 | Disposition: A | Discharge status: Home / Self Care | Payer: Medicare Other | Attending: Emergency Medicine | Admitting: Emergency Medicine

## 2022-01-01 ENCOUNTER — Other Ambulatory Visit: Payer: Self-pay

## 2022-01-01 DIAGNOSIS — M79671 Pain in right foot: Secondary | ICD-10-CM | POA: Diagnosis not present

## 2022-01-01 DIAGNOSIS — M19071 Primary osteoarthritis, right ankle and foot: Secondary | ICD-10-CM | POA: Diagnosis not present

## 2022-01-01 DIAGNOSIS — M7989 Other specified soft tissue disorders: Secondary | ICD-10-CM | POA: Diagnosis not present

## 2022-01-01 LAB — CBC WITH DIFFERENTIAL/PLATELET
Abs Immature Granulocytes: 0.09 10*3/uL — ABNORMAL HIGH (ref 0.00–0.07)
Basophils Absolute: 0.1 10*3/uL (ref 0.0–0.1)
Basophils Relative: 1 %
Eosinophils Absolute: 0.2 10*3/uL (ref 0.0–0.5)
Eosinophils Relative: 2 %
HCT: 46.2 % (ref 39.0–52.0)
Hemoglobin: 14.9 g/dL (ref 13.0–17.0)
Immature Granulocytes: 1 %
Lymphocytes Relative: 20 %
Lymphs Abs: 1.9 10*3/uL (ref 0.7–4.0)
MCH: 26.9 pg (ref 26.0–34.0)
MCHC: 32.3 g/dL (ref 30.0–36.0)
MCV: 83.5 fL (ref 80.0–100.0)
Monocytes Absolute: 1 10*3/uL (ref 0.1–1.0)
Monocytes Relative: 10 %
Neutro Abs: 6.7 10*3/uL (ref 1.7–7.7)
Neutrophils Relative %: 66 %
Platelets: 194 10*3/uL (ref 150–400)
RBC: 5.53 MIL/uL (ref 4.22–5.81)
RDW: 15.2 % (ref 11.5–15.5)
WBC: 10 10*3/uL (ref 4.0–10.5)
nRBC: 0 % (ref 0.0–0.2)

## 2022-01-01 LAB — BASIC METABOLIC PANEL
Anion gap: 5 (ref 5–15)
BUN: 18 mg/dL (ref 8–23)
CO2: 26 mmol/L (ref 22–32)
Calcium: 8.9 mg/dL (ref 8.9–10.3)
Chloride: 106 mmol/L (ref 98–111)
Creatinine, Ser: 1.51 mg/dL — ABNORMAL HIGH (ref 0.61–1.24)
GFR, Estimated: 49 mL/min — ABNORMAL LOW (ref >60)
Glucose, Bld: 101 mg/dL — ABNORMAL HIGH (ref 70–99)
Potassium: 4.2 mmol/L (ref 3.5–5.1)
Sodium: 137 mmol/L (ref 135–145)

## 2022-01-01 LAB — URIC ACID: Uric Acid, Serum: 8.9 mg/dL — ABNORMAL HIGH (ref 3.7–8.6)

## 2022-01-01 MED ORDER — OXYCODONE-ACETAMINOPHEN 5-325 MG PO TABS: 1.0000 | ORAL_TABLET | Freq: Four times a day (QID) | ORAL | 0 refills | Status: DC | PRN

## 2022-01-01 MED ORDER — PREDNISONE 20 MG PO TABS
40.0000 mg | ORAL_TABLET | Freq: Once | ORAL | Status: AC
  Administered 2022-01-01: 40 mg via ORAL
  Filled 2022-01-01: qty 2

## 2022-01-01 MED ORDER — FENTANYL CITRATE PF 50 MCG/ML IJ SOSY
50.0000 ug | PREFILLED_SYRINGE | Freq: Once | INTRAMUSCULAR | Status: AC
  Administered 2022-01-01: 50 ug via INTRAVENOUS
  Filled 2022-01-01: qty 1

## 2022-01-01 MED ORDER — OXYCODONE-ACETAMINOPHEN 5-325 MG PO TABS
1.0000 | ORAL_TABLET | ORAL | Status: DC | PRN
  Administered 2022-01-01: 1 via ORAL
  Filled 2022-01-01: qty 1

## 2022-01-01 MED ORDER — PREDNISONE 20 MG PO TABS: 40.0000 mg | ORAL_TABLET | Freq: Every day | ORAL | 0 refills | Status: DC

## 2022-01-01 NOTE — ED Triage Notes (Signed)
Pt presents with right foot pain x 1.5 hours. Sudden onset. Describes pain as throbbing. Denies injury.

## 2022-01-01 NOTE — Discharge Instructions (Addendum)
You were seen in the emergency department for evaluation of a cute onset of right great toe pain.  You had an x-ray and ultrasound of your leg.  You had blood work.  Your symptoms are most likely due to gout.  You were given some steroids and pain medication.  We are putting you on a few days of steroids and some pain medicine.  Please elevate your leg and you can use ice as needed.  Follow-up with your regular doctor.  Return to the emergency department if any worsening or concerning symptoms.

## 2022-01-01 NOTE — ED Provider Notes (Signed)
[facility] EMERGENCY DEPARTMENT Provider Note   CSN: [id number] Arrival date & time: 01/01/22  1828     History  Chief Complaint  Patient presents with   Foot Pain    Anthony King is a 71 y.o. male.  He has a history of CKD coronary disease.  Complaining of acute onset of severe right great toe pain that started about 2 hours ago.  Throbbing in nature worse with touch and movement.  No known trauma.  Does have a history of gout.  States he cannot move the toe.  No other foot or ankle pain.  No chest pain or shortness of breath  The history is provided by the patient.  Foot Pain This is a new problem. The current episode started 1 to 2 hours ago. The problem occurs constantly. The problem has not changed since onset.Pertinent negatives include no chest pain, no abdominal pain, no headaches and no shortness of breath. The symptoms are aggravated by bending. Nothing relieves the symptoms. He has tried nothing for the symptoms. The treatment provided no relief.       Home Medications Prior to Admission medications   Medication Sig Start Date End Date Taking? Authorizing Provider  albuterol (PROVENTIL) (2.5 MG/3ML) 0.083% nebulizer solution Take 3 mLs (2.5 mg total) by nebulization every 6 (six) hours as needed for wheezing or shortness of breath. 04/08/21   Ann Held, DO  albuterol (VENTOLIN HFA) 108 (90 Base) MCG/ACT inhaler Inhale 2 puffs into the lungs every 6 (six) hours as needed for wheezing or shortness of breath. 08/12/20   Ann Held, DO  Budeson-Glycopyrrol-Formoterol (BREZTRI AEROSPHERE) 160-9-4.8 MCG/ACT AERO Inhale 2 puffs into the lungs in the morning and at bedtime. 03/12/21   Freddi Starr, MD  budesonide (PULMICORT) 0.5 MG/2ML nebulizer solution Take 2 mLs (0.5 mg total) by nebulization 2 (two) times daily. 02/28/21   Freddi Starr, MD  lansoprazole (PREVACID) 30 MG capsule Take 1 capsule (30 mg total) by mouth daily at 12  noon. 08/12/20   Ann Held, DO  Psyllium (METAMUCIL FREE & NATURAL) 43 % POWD Take 2 mLs by mouth daily. Metamucil with coffee    [provider]  vitamin B-12 (CYANOCOBALAMIN) 1000 MCG tablet Take 1,000 mcg by mouth daily.     [provider]      Allergies    Aspirin    Review of Systems   Review of Systems  Constitutional:  Negative for fever.  Eyes:  Negative for visual disturbance.  Respiratory:  Negative for shortness of breath.   Cardiovascular:  Negative for chest pain.  Gastrointestinal:  Negative for abdominal pain.  Musculoskeletal:  Negative for back pain.  Skin:  Negative for wound.  Neurological:  Negative for headaches.    Physical Exam Updated Vital Signs BP (!) 146/77 (BP Location: Left Arm)   Pulse 63   Temp 98.5 F (36.9 C) (Oral)   Resp 18   Wt 98.9 kg   SpO2 99%   BMI 31.28 kg/m  Physical Exam Vitals and nursing note reviewed.  Constitutional:      General: He is not in acute distress.    Appearance: Normal appearance. He is well-developed.  HENT:     Head: Normocephalic and atraumatic.  Eyes:     Conjunctiva/sclera: Conjunctivae normal.  Cardiovascular:     Rate and Rhythm: Normal rate and regular rhythm.     Heart sounds: No murmur heard. Pulmonary:  Effort: Pulmonary effort is normal. No respiratory distress.     Breath sounds: Normal breath sounds.  Abdominal:     Palpations: Abdomen is soft.     Tenderness: There is no abdominal tenderness. There is no guarding or rebound.  Musculoskeletal:        General: Tenderness present. No deformity.     Cervical back: Neck supple.     Right lower leg: No edema.     Left lower leg: No edema.     Comments: He has exquisite pain on his right great toe at the MCP.  Painful even to light touch.  Unable to range of motion.  Cap refill brisk.  DP pulse 2+.  Foot is warm and well-perfused.  The rest of the foot and the ankle are nontender.  Calf supple without any cords  appreciated.  Skin:    General: Skin is warm and dry.     Capillary Refill: Capillary refill takes less than 2 seconds.  Neurological:     General: No focal deficit present.     Mental Status: He is alert.     ED Results / Procedures / Treatments   Labs (all labs ordered are listed, but only abnormal results are displayed) Labs Reviewed  BASIC METABOLIC PANEL - Abnormal; Notable for the following components:      Result Value   Glucose, Bld 101 (*)    Creatinine, Ser 1.51 (*)    GFR, Estimated 49 (*)    All other components within normal limits  CBC WITH DIFFERENTIAL/PLATELET - Abnormal; Notable for the following components:   Abs Immature Granulocytes 0.09 (*)    All other components within normal limits  URIC ACID - Abnormal; Notable for the following components:   Uric Acid, Serum 8.9 (*)    All other components within normal limits    EKG None  Radiology US Venous Img Lower Unilateral Right  Result Date: 01/01/2022 CLINICAL DATA:  Right foot pain and swelling, initial encounter EXAM: RIGHT LOWER EXTREMITY VENOUS DOPPLER ULTRASOUND TECHNIQUE: Gray-scale sonography with graded compression, as well as color Doppler and duplex ultrasound were performed to evaluate the lower extremity deep venous systems from the level of the common femoral vein and including the common femoral, femoral, profunda femoral, popliteal and calf veins including the posterior tibial, peroneal and gastrocnemius veins when visible. The superficial great saphenous vein was also interrogated. Spectral Doppler was utilized to evaluate flow at rest and with distal augmentation maneuvers in the common femoral, femoral and popliteal veins. COMPARISON:  None Available. FINDINGS: Contralateral Common Femoral Vein: Respiratory phasicity is normal and symmetric with the symptomatic side. No evidence of thrombus. Normal compressibility. Common Femoral Vein: No evidence of thrombus. Normal compressibility, respiratory  phasicity and response to augmentation. Saphenofemoral Junction: No evidence of thrombus. Normal compressibility and flow on color Doppler imaging. Profunda Femoral Vein: No evidence of thrombus. Normal compressibility and flow on color Doppler imaging. Femoral Vein: No evidence of thrombus. Normal compressibility, respiratory phasicity and response to augmentation. Popliteal Vein: No evidence of thrombus. Normal compressibility, respiratory phasicity and response to augmentation. Calf Veins: No evidence of thrombus. Normal compressibility and flow on color Doppler imaging. Superficial Great Saphenous Vein: No evidence of thrombus. Normal compressibility. Venous Reflux:  None. Other Findings:  None. IMPRESSION: No evidence of deep venous thrombosis. Electronically Signed   By: Inez Catalina M.D.   On: 01/01/2022 19:47   DG Toe Great Right  Result Date: 01/01/2022 CLINICAL DATA:  First toe pain  for 2 hours, initial encounter EXAM: RIGHT GREAT TOE COMPARISON:  None Available. FINDINGS: Degenerative changes of the first MTP joint are noted. No fracture or dislocation is seen. No soft tissue abnormality is noted. IMPRESSION: Degenerative change of the first MTP joint. No acute abnormality noted. Electronically Signed   By: Inez Catalina M.D.   On: 01/01/2022 19:30    Procedures Procedures    Medications Ordered in ED Medications  oxyCODONE-acetaminophen (PERCOCET/ROXICET) 5-325 MG per tablet 1 tablet (has no administration in time range)  fentaNYL (SUBLIMAZE) injection 50 mcg (has no administration in time range)    ED Course/ Medical Decision Making/ A&P Clinical Course as of 01/02/22 0858  Thu Jan 01, 2022  2042 Patient looks more comfortable after medication.  X-ray does not show any acute fracture and duplex is negative.  Creatinine elevated but has been in the past.  Symptoms most likely related to gout.  Unusual presentation so acute.  We will put on a few days of prednisone and some pain  medication.  Recommended close follow-up with PCP.  Return instructions discussed [MB]    Clinical Course User Index [MB] Hayden Rasmussen, MD                           Medical Decision Making Amount and/or Complexity of Data Reviewed Labs: ordered. Radiology: ordered.  Risk Prescription drug management.   This patient complains of right great toe pain; this involves an extensive number of treatment Options and is a complaint that carries with it a high risk of complications and morbidity. The differential includes gout, fracture, DVT, arterial embolus infection  I ordered, reviewed and interpreted labs, which included CBC with normal white count normal hemoglobin, chemistries normal other than elevated creatinine stable from priors, uric acid elevated I ordered medication IV pain medication and oral steroids and reviewed PMP when indicated. I ordered imaging studies which included duplex right lower extremity and x-ray foot and I independently    visualized and interpreted imaging which showed no acute findings Additional history obtained from patient's wife Previous records obtained and reviewed in epic no recent admissions Cardiac monitoring reviewed, sinus rhythm Social determinants considered, no significant barriers Critical Interventions: None  After the interventions stated above, I reevaluated the patient and found patient's pain to be improved Admission and further testing considered, no indications for admission or further work-up at this time.  Will treat symptomatically with steroids and narcotics.  Recommended close follow-up with PCP.  Return instructions discussed         Final Clinical Impression(s) / ED Diagnoses Final diagnoses:  Foot pain, right    Rx / DC Orders ED Discharge Orders          Ordered    predniSONE (DELTASONE) 20 MG tablet  Daily        01/01/22 2045    oxyCODONE-acetaminophen (PERCOCET/ROXICET) 5-325 MG tablet  Every 6 hours PRN         01/01/22 2045              Hayden Rasmussen, MD 01/02/22 0900

## 2022-01-16 ENCOUNTER — Ambulatory Visit (INDEPENDENT_AMBULATORY_CARE_PROVIDER_SITE_OTHER): Payer: Medicare Other | Admitting: Podiatry

## 2022-01-16 ENCOUNTER — Encounter: Payer: Self-pay | Admitting: Podiatry

## 2022-01-16 DIAGNOSIS — L600 Ingrowing nail: Secondary | ICD-10-CM

## 2022-01-16 DIAGNOSIS — B351 Tinea unguium: Secondary | ICD-10-CM

## 2022-01-16 NOTE — Progress Notes (Signed)
Subjective:   Patient ID: Anthony King, male   DOB: 71 y.o.   MRN: [id number]   HPI Patient presents stating that he has a lot of pain with the right big toenail that its been bothering him a lot making it hard for him to wear shoe gear comfortably.  He had his left foot removed a while ago he does get some natural thickness of his nailbeds that he was concerned about and wanted to get checked.  Patient has had no other change in health history does not smoke likes to be active did have COVID 3 years ago with some lung issues and has some reduced lung capacity but beyond that doing well   Review of Systems  All other systems reviewed and are negative.       Objective:  Physical Exam Vitals and nursing note reviewed.  Constitutional:      Appearance: He is well-developed.  Pulmonary:     Effort: Pulmonary effort is normal.  Musculoskeletal:        General: Normal range of motion.  Skin:    General: Skin is warm.  Neurological:     Mental Status: He is alert.     Neurovascular status found to be intact muscle strength was found to be adequate range of motion adequate.  Patient does have a thickened right hallux nail bed that is painful both medial and lateral and across the dorsal surface with abnormal color positioning and does have several of the nails and also gets some thickness.  Patient has good digital perfusion well oriented x3     Assessment:  Damaged right hallux nail with ingrown component with also history of fungal nail disease     Plan:  H&P reviewed both conditions discussed this and I have recommended long-term removal of the nailbed.  I did explain procedure risk he wants surgery and understands risk signed consent form and today I infiltrated the right hallux 60 mg like Marcaine mixture sterile prep done and using sterile instrumentation remove the nail exposed matrix applied phenol 3 applications 30 seconds followed by alcohol of sterile dressing gave  instructions on soaks leave dressing on 24 hours take it off earlier if throbbing were to occur encouraged to call questions concerns

## 2022-01-16 NOTE — Patient Instructions (Signed)

## 2022-02-25 DIAGNOSIS — M25562 Pain in left knee: Secondary | ICD-10-CM | POA: Insufficient documentation

## 2022-03-04 ENCOUNTER — Encounter: Payer: Self-pay | Admitting: Nurse Practitioner

## 2022-03-04 ENCOUNTER — Ambulatory Visit (INDEPENDENT_AMBULATORY_CARE_PROVIDER_SITE_OTHER): Payer: Medicare Other | Admitting: Nurse Practitioner

## 2022-03-04 VITALS — BP 142/80 | HR 66 | Ht 70.0 in | Wt 222.0 lb

## 2022-03-04 DIAGNOSIS — K648 Other hemorrhoids: Secondary | ICD-10-CM

## 2022-03-04 NOTE — Patient Instructions (Signed)
If you are age 71 or younger, your body mass index should be between 19-25. Your Body mass index is 31.85 kg/m. If this is out of the aformentioned range listed, please consider follow up with your Primary Care Provider.   __________________________________________________________  The [facility] providers would like to encourage you to use [facility] to communicate with providers for non-urgent requests or questions.  Due to long hold times on the telephone, sending your provider a message by [facility] may be a faster and more efficient way to get a response.  Please allow 48 business hours for a response.  Please remember that this is for non-urgent requests.   You will be contacted by [facility] to schedule a consult. [facility] is located at [address], [phone].    Thank you for choosing me and [facility].  Tye Savoy, NP.

## 2022-03-04 NOTE — Progress Notes (Signed)
Chief Complaint: Recurrent rectal bleeding and pain with hemorrhoids   Assessment &  Plan   #71 year old male with recurrent rectal bleeding,  pain and prolapsing internal hemorrhoids.  He has undergone internal hemorrhoid banding on several occasions.  Unfortunately he got only 6 months relief following his last banding in May 2022.  On anoscopy there is at least one inflamed internal hemorrhoid.  Previously he has wanted to avoid surgery but banding has provided him only transient relief.  I discussed with Dr. Havery Moros who is patient's primary GI and also performed his previous banding.  I think it is reasonable at this point to refer to Colorectal Surgery. Patient is agreeable.  Continue daily fiber.   HPI   Anthony King is a 71 y.o. male known to Dr.  Havery Moros. He has a history of colon polyps. He also has a history of hemorrhoids and has undergone banding on several occasions. ith a  See PMH /PSH for additional history   Anthony King had his last internal hemorrhoid banding in May 2022. Banding generally works for a while. He has wanted to avoid surgery.   Interval History:  Anthony King comes in today with recurrent rectal bleeding and rectal pain.  His stools are soft, he does not strain.  He is taking daily fiber.  His symptoms reappeared approximately 6 months ago.  He describes not only pain and rectal bleeding but protrusion of internal hemorrhoids during a bowel movement.  He is able to push the hemorrhoids back up into the rectum and they will stay there until the next bowel movement.  Labs:     Latest Ref Rng & Units 01/01/2022    7:49 PM 02/06/2021   10:30 AM 09/10/2020   10:28 AM  CBC  WBC 4.0 - 10.5 K/uL 10.0  5.2  6.7   Hemoglobin 13.0 - 17.0 g/dL 14.9  14.4  14.4   Hematocrit 39.0 - 52.0 % 46.2  44.6  43.9   Platelets 150 - 400 K/uL 194  177.0  181.0        Latest Ref Rng & Units 02/06/2021   10:30 AM 09/10/2020   10:28 AM 08/12/2020    9:56 AM  Hepatic Function   Total Protein 6.0 - 8.3 g/dL 6.3  6.4  6.6   Albumin 3.5 - 5.2 g/dL 4.1  4.0  4.1   AST 0 - 37 U/L _0 ALT 0 - 53 U/L _1 Alk Phosphatase 39 - 117 U/L 99  105  96   Total Bilirubin 0.2 - 1.2 mg/dL 1.0  1.2  1.2      Past Medical History:  Diagnosis Date   Arthritis 07/17/2011   hands, hips, knees   Basal cell carcinoma    nose   Colon polyps    ADENOMATOUS AND HYPERPLASTIC POLYPS   COVID-19    Diverticulosis    DOE (dyspnea on exertion) 12/07/2019   Onset with covid 19 pna  - see adimit 10/18/19  -  12/07/2019   Walked RA  3 laps @ approx 239f each @ slow slt unsteady  pace  stopped due to sob / weakness but sats never less than 92%    Dyspnea    with exertion   Gastric ulcer    GERD (gastroesophageal reflux disease)    Hemorrhoids    History of kidney stones    Hyperlipidemia    Hypoxia 10/18/2019   Left lower  quadrant abdominal pain 05/31/2018   Pneumonia    Wears glasses     Past Surgical History:  Procedure Laterality Date   APPENDECTOMY     BASAL CELL CARCINOMA EXCISION     nose   CARDIAC CATHETERIZATION     COLONOSCOPY     EXCISION OF MESH Right 02/15/2020   Procedure: EXPLANTATION OF MESH;  Surgeon: Erroll Luna, MD;  Location: [place];  Service: General;  Laterality: Right;   fatty tumors  07-17-11   excised x4- 1 -neck, 1 arm, 2 chest   GROIN DISSECTION Right 02/15/2020   Procedure: RIGHT GROIN NEURECTOMY;  Surgeon: Erroll Luna, MD;  Location: [place];  Service: General;  Laterality: Right;  DR FAROOQUI APPROVED CORNETT USING HIS TIME PER ABBIE   HEMORRHOID [place]  07/23/2011   Procedure: LAPAROSCOPIC INGUINAL HERNIA;  Surgeon: Stark Klein, MD;  Location: [facility];  Service: General;  Laterality: Left;   INGUINAL HERNIA REPAIR Bilateral 04/19/2019   Procedure: BILATERAL OPEN INGUINAL HERNIA REPAIR WITH MESH;  Surgeon: Erroll Luna, MD;  Location: [place];  Service: General;  Laterality:  Bilateral;   INGUINAL HERNIA REPAIR Right 02/15/2020   Procedure: RIGHT HERNIA REPAIR INGUINAL WITH MESH;  Surgeon: Erroll Luna, MD;  Location: [place];  Service: General;  Laterality: Right;   LAPAROSCOPY N/A 02/15/2020   Procedure: LAPAROSCOPY DIAGNOSTIC;  Surgeon: Erroll Luna, MD;  Location: [place];  Service: General;  Laterality: N/A;   LEFT HEART CATH AND CORONARY ANGIOGRAPHY N/A 03/21/2018   Procedure: LEFT HEART CATH AND CORONARY ANGIOGRAPHY;  Surgeon: Jettie Booze, MD;  Location: [place] [facility];  Service: Cardiovascular;  Laterality: N/A;   MASS EXCISION N/A 08/29/2013   Procedure: EXCISION LEFT FLANK MASS/RIGHT EXCISION CHEST WALL MASS;  Surgeon: Stark Klein, MD;  Location: [place];  Service: General;  Laterality: N/A;  Left flank   POLYPECTOMY     TONSILLECTOMY      Current Medications, Allergies, Family History and Social History were reviewed in [employer] record.     Current Outpatient Medications  Medication Sig Dispense Refill   albuterol (PROVENTIL) (2.5 MG/3ML) 0.083% nebulizer solution Take 3 mLs (2.5 mg total) by nebulization every 6 (six) hours as needed for wheezing or shortness of breath. 75 mL 12   albuterol (VENTOLIN HFA) 108 (90 Base) MCG/ACT inhaler Inhale 2 puffs into the lungs every 6 (six) hours as needed for wheezing or shortness of breath. 18 g 1   Budeson-Glycopyrrol-Formoterol (BREZTRI AEROSPHERE) 160-9-4.8 MCG/ACT AERO Inhale 2 puffs into the lungs in the morning and at bedtime. 10.7 g 11   budesonide (PULMICORT) 0.5 MG/2ML nebulizer solution Take 2 mLs (0.5 mg total) by nebulization 2 (two) times daily. 120 mL 11   lansoprazole (PREVACID) 30 MG capsule Take 1 capsule (30 mg total) by mouth daily at 12 noon. 90 capsule 3   oxyCODONE-acetaminophen (PERCOCET/ROXICET) 5-325 MG tablet Take 1 tablet by mouth every 6 (six) hours as needed for severe pain. 12 tablet 0   predniSONE (DELTASONE) 20 MG tablet Take 2  tablets (40 mg total) by mouth daily. 6 tablet 0   Psyllium (METAMUCIL FREE & NATURAL) 43 % POWD Take 2 mLs by mouth daily. Metamucil with coffee     vitamin B-12 (CYANOCOBALAMIN) 1000 MCG tablet Take 1,000 mcg by mouth daily.      No current facility-administered medications for this visit.    Review of Systems: No  chest pain. No shortness of breath. No urinary complaints.    Physical Exam  Wt Readings from Last 3 Encounters:  01/01/22 218 lb (98.9 kg)  08/11/21 225 lb 3.2 oz (102.2 kg)  02/19/21 223 lb 3.2 oz (101.2 kg)    BP (!) 142/80   Pulse 66   Ht 5' 10" (1.778 m)   Wt 222 lb (100.7 kg)   BMI 31.85 kg/m  Constitutional:  Generally well appearing male in no acute distress. Psychiatric: Pleasant. Normal mood and affect. Behavior is normal. EENT: Pupils normal.  Conjunctivae are normal. No scleral icterus. Neck supple.  Cardiovascular: Normal rate, regular rhythm. No edema Pulmonary/chest: Effort normal and breath sounds normal. No wheezing, rales or rhonchi. Abdominal: Soft, nondistended, nontender. Bowel sounds active throughout. There are no masses palpable. No hepatomegaly. Rectal: No significant external hemorrhoids swelling.  On endoscopy there was at least 1 very inflamed internal hemorrhoid.  Neurological: Alert and oriented to person place and time. Skin: Skin is warm and dry. No rashes noted.  Tye Savoy, NP  03/04/2022, 1:26 PM  Cc:  Carollee Herter, Alferd Apa, *

## 2022-03-05 NOTE — Progress Notes (Signed)
Agree with assessment and plan as outlined. Unfortunately numerous bandings to date which have helped but not resolved his symptoms. At this point, given persistent recurrence I think surgical evaluation is reasonable and they can discuss that option with him if he wishes to proceed. If he declines surgery we can consider another banding.

## 2022-03-06 DIAGNOSIS — M25562 Pain in left knee: Secondary | ICD-10-CM | POA: Diagnosis not present

## 2022-03-18 DIAGNOSIS — K642 Third degree hemorrhoids: Secondary | ICD-10-CM | POA: Diagnosis not present

## 2022-03-19 DIAGNOSIS — S83242A Other tear of medial meniscus, current injury, left knee, initial encounter: Secondary | ICD-10-CM | POA: Diagnosis not present

## 2022-04-24 DIAGNOSIS — H35372 Puckering of macula, left eye: Secondary | ICD-10-CM | POA: Diagnosis not present

## 2022-04-24 DIAGNOSIS — H25013 Cortical age-related cataract, bilateral: Secondary | ICD-10-CM | POA: Diagnosis not present

## 2022-04-24 DIAGNOSIS — H18413 Arcus senilis, bilateral: Secondary | ICD-10-CM | POA: Diagnosis not present

## 2022-04-24 DIAGNOSIS — H2511 Age-related nuclear cataract, right eye: Secondary | ICD-10-CM | POA: Diagnosis not present

## 2022-04-24 DIAGNOSIS — H2513 Age-related nuclear cataract, bilateral: Secondary | ICD-10-CM | POA: Diagnosis not present

## 2022-04-27 DIAGNOSIS — X58XXXA Exposure to other specified factors, initial encounter: Secondary | ICD-10-CM | POA: Diagnosis not present

## 2022-04-27 DIAGNOSIS — M2242 Chondromalacia patellae, left knee: Secondary | ICD-10-CM | POA: Diagnosis not present

## 2022-04-27 DIAGNOSIS — Y999 Unspecified external cause status: Secondary | ICD-10-CM | POA: Diagnosis not present

## 2022-04-27 DIAGNOSIS — S83232A Complex tear of medial meniscus, current injury, left knee, initial encounter: Secondary | ICD-10-CM | POA: Diagnosis not present

## 2022-04-27 DIAGNOSIS — G8918 Other acute postprocedural pain: Secondary | ICD-10-CM | POA: Diagnosis not present

## 2022-04-30 NOTE — Progress Notes (Signed)
Sent message, via epic in basket, requesting order in epic from surgeon    04/30/22 1120  Preop Orders  Has preop orders? No  Name of staff/physician contacted for orders(Indicate phone or IB message) M. Hassell Done, MD.

## 2022-05-04 ENCOUNTER — Ambulatory Visit: Payer: Self-pay | Admitting: Surgery

## 2022-05-05 NOTE — Progress Notes (Signed)
COVID Vaccine Completed:  Yes  Date of COVID positive in last 90 days:  PCP - Roma Schanz, DO Cardiologist - Darlina Guys, MD [place], MD  Chest x-ray -  EKG -  Stress Test - 08-20-21 Epic ECHO - 01-12-20 Epic Cardiac Cath - 2019 Epic Pacemaker/ICD device last checked: Spinal Cord Stimulator:  Bowel Prep -   Sleep Study - Yes, +sleep apnea CPAP -   Fasting Blood Sugar -  Checks Blood Sugar _____ times a day  Last dose of GLP1 agonist-  N/A GLP1 instructions:  N/A   Last dose of SGLT-2 inhibitors-  N/A SGLT-2 instructions: N/A   Blood Thinner Instructions: Aspirin Instructions: Last Dose:  Activity level:  Can go up a flight of stairs and perform activities of daily living without stopping and without symptoms of chest pain or shortness of breath.  Able to exercise without symptoms  Unable to go up a flight of stairs without symptoms of     Anesthesia review: CAD, unstable angina, CKD, OSA.  Shortness of breath.  Post covid pulmonary fibrosis  Patient denies shortness of breath, fever, cough and chest pain at PAT appointment  Patient verbalized understanding of instructions that were given to them at the PAT appointment. Patient was also instructed that they will need to review over the PAT instructions again at home before surgery.

## 2022-05-05 NOTE — Patient Instructions (Signed)
SURGICAL WAITING ROOM VISITATION Patients having surgery or a procedure may have no more than 2 support people in the waiting area - these visitors may rotate.   Children under the age of 10 must have an adult with them who is not the patient. If the patient needs to stay at the hospital during part of their recovery, the visitor guidelines for inpatient rooms apply. Pre-op nurse will coordinate an appropriate time for 1 support person to accompany patient in pre-op.  This support person may not rotate.    Please refer to the [facility] website for the visitor guidelines for Inpatients (after your surgery is over and you are in a regular room).      Your procedure is scheduled on: 05-15-22   Report to [facility] Main Entrance    Report to admitting at 33:15 M   Call this number if you have problems the morning of surgery [phone]   Do not eat food :After Midnight.   After Midnight you may have the following liquids until 6:30 AM DAY OF SURGERY  Water Non-Citrus Juices (without pulp, NO RED) Carbonated Beverages Black Coffee (NO MILK/CREAM OR CREAMERS, sugar ok)  Clear Tea (NO MILK/CREAM OR CREAMERS, sugar ok) regular and decaf                             Plain Jell-O (NO RED)                                           Fruit ices (not with fruit pulp, NO RED)                                     Popsicles (NO RED)                                                               Sports drinks like Gatorade (NO RED)                      If you have questions, please contact your surgeon's office.   FOLLOW BOWEL PREP AND ANY ADDITIONAL PRE OP INSTRUCTIONS YOU RECEIVED FROM YOUR SURGEON'S OFFICE!!!   -Administer a fleet enema the night before surgery and the morning of surgery   Oral Hygiene is also important to reduce your risk of infection.                                    Remember - BRUSH YOUR TEETH THE MORNING OF SURGERY WITH YOUR REGULAR TOOTHPASTE   Do NOT smoke  after Midnight   Take these medicines the morning of surgery with A SIP OF WATER:   Prevacid  Bring CPAP mask and tubing day of surgery.                              You may not have any metal on your body including jewelry,  and body piercing             Do not wear  lotions, powders, cologne, or deodorant              Men may shave face and neck.   Do not bring valuables to the hospital. [place].   Contacts, dentures or bridgework may not be worn into surgery.   Bring small overnight bag day of surgery. DO NOT [place]. PHARMACY WILL DISPENSE MEDICATIONS LISTED ON YOUR MEDICATION LIST TO YOU DURING YOUR ADMISSION [place]!    Patients discharged on the day of surgery will not be allowed to drive home.  Someone NEEDS to stay with you for the first 24 hours after anesthesia.   Special Instructions: Bring a copy of your healthcare power of attorney and living will documents the day of surgery if you haven't scanned them before.              Please read over the following fact sheets you were given: IF [place]  If you received a COVID test during your pre-op visit  it is requested that you wear a mask when out in public, stay away from anyone that may not be feeling well and notify your surgeon if you develop symptoms. If you test positive for Covid or have been in contact with anyone that has tested positive in the last 10 days please notify you surgeon.  [place] - Preparing for Surgery Before surgery, you can play an important role.  Because skin is not sterile, your skin needs to be as free of germs as possible.  You can reduce the number of germs on your skin by washing with CHG (chlorahexidine gluconate) soap before surgery.  CHG is an antiseptic cleaner which kills germs and bonds with the skin to continue killing germs even after  washing. Please DO NOT use if you have an allergy to CHG or antibacterial soaps.  If your skin becomes reddened/irritated stop using the CHG and inform your nurse when you arrive at Short Stay. Do not shave (including legs and underarms) for at least 48 hours prior to the first CHG shower.  You may shave your face/neck.  Please follow these instructions carefully:  1.  Shower with CHG Soap the night before surgery and the  morning of surgery.  2.  If you choose to wash your hair, wash your hair first as usual with your normal  shampoo.  3.  After you shampoo, rinse your hair and body thoroughly to remove the shampoo.                             4.  Use CHG as you would any other liquid soap.  You can apply chg directly to the skin and wash.  Gently with a scrungie or clean washcloth.  5.  Apply the CHG Soap to your body ONLY FROM THE NECK DOWN.   Do   not use on face/ open                           Wound or open sores. Avoid contact with eyes, ears mouth and   genitals (private parts).  Wash face,  Genitals (private parts) with your normal soap.             6.  Wash thoroughly, paying special attention to the area where your    surgery  will be performed.  7.  Thoroughly rinse your body with warm water from the neck down.  8.  DO NOT shower/wash with your normal soap after using and rinsing off the CHG Soap.                9.  Pat yourself dry with a clean towel.            10.  Wear clean pajamas.            11.  Place clean sheets on your bed the night of your first shower and do not  sleep with pets. Day of Surgery : Do not apply any lotions/deodorants the morning of surgery.  Please wear clean clothes to the hospital/surgery center.  FAILURE TO FOLLOW THESE INSTRUCTIONS MAY RESULT IN THE CANCELLATION OF YOUR SURGERY  PATIENT SIGNATURE_________________________________  NURSE  SIGNATURE__________________________________  ________________________________________________________________________

## 2022-05-07 ENCOUNTER — Encounter (HOSPITAL_COMMUNITY): Payer: Self-pay

## 2022-05-07 ENCOUNTER — Other Ambulatory Visit: Payer: Self-pay

## 2022-05-07 ENCOUNTER — Encounter (HOSPITAL_COMMUNITY)
Admission: RE | Admit: 2022-05-07 | Discharge: 2022-05-07 | Disposition: A | Discharge status: Home / Self Care | Payer: Medicare Other | Source: Ambulatory Visit | Attending: Surgery | Admitting: Surgery

## 2022-05-07 VITALS — BP 154/79 | HR 65 | Temp 98.2°F | Resp 16 | Ht 70.0 in | Wt 215.8 lb

## 2022-05-07 DIAGNOSIS — I2510 Atherosclerotic heart disease of native coronary artery without angina pectoris: Secondary | ICD-10-CM | POA: Diagnosis not present

## 2022-05-07 DIAGNOSIS — R7303 Prediabetes: Secondary | ICD-10-CM | POA: Diagnosis not present

## 2022-05-07 DIAGNOSIS — Z01818 Encounter for other preprocedural examination: Secondary | ICD-10-CM | POA: Diagnosis not present

## 2022-05-07 HISTORY — DX: Sleep apnea, unspecified: G47.30

## 2022-05-07 HISTORY — DX: Headache, unspecified: R51.9

## 2022-05-07 HISTORY — DX: Atherosclerotic heart disease of native coronary artery without angina pectoris: I25.10

## 2022-05-07 LAB — BASIC METABOLIC PANEL
Anion gap: 6 (ref 5–15)
BUN: 16 mg/dL (ref 8–23)
CO2: 25 mmol/L (ref 22–32)
Calcium: 9.1 mg/dL (ref 8.9–10.3)
Chloride: 111 mmol/L (ref 98–111)
Creatinine, Ser: 1.27 mg/dL — ABNORMAL HIGH (ref 0.61–1.24)
GFR, Estimated: >60 mL/min (ref >60)
Glucose, Bld: 102 mg/dL — ABNORMAL HIGH (ref 70–99)
Potassium: 4.2 mmol/L (ref 3.5–5.1)
Sodium: 142 mmol/L (ref 135–145)

## 2022-05-07 LAB — HEMOGLOBIN A1C
Hgb A1c MFr Bld: 5.4 % (ref 4.8–5.6)
Mean Plasma Glucose: 108.28 mg/dL

## 2022-05-07 LAB — GLUCOSE, CAPILLARY: Glucose-Capillary: 107 mg/dL — ABNORMAL HIGH (ref 70–99)

## 2022-05-13 NOTE — H&P (Signed)
REFERRING PHYSICIAN:  Willia Craze, NP   PROVIDER:  Joya San, MD   MRN: [id number] DOB: Jan 27, 1951   Subjective    Chief Complaint: New Consultation (Hemorrhoids )       History of Present Illness: Anthony King is a 71 y.o. male who is seen today as an office consultation at the request of Tye Savoy for evaluation of New Consultation (Hemorrhoids ) .     He has undergone several internal hemorrhoidal banding's by Dr. Skeet Latch.  His last banding was in May 2022.  He had a anoscopy at the [place] office and had to at least 1 inflamed internal hemorrhoid.   By history he does have prolapsing hemorrhoids after any bowel movement.  He always has to reduce those manually.  With these he has pain and bleeding.  He also has hygiene issues getting a clean wipe since there is so much irregularity with prolapsed hemorrhoids.   Past medical history reveals he has had at least 3 operations by Dr. Vida Rigger and then Dr. Brantley Stage for a laparoscopic hernia repair that failed and was followed by open procedure and neurolysis for pain.  He still has some pain related to that.  He had an MRI recently but for her knee pain and he is seeing Watt Climes tomorrow for what is probably a meniscal tear.   I discussed hemorrhoidectomy with him in some detail and he is aware of the intensive pain is often associated with this procedure.  We do use Exparel block to try to cut down that.  I will do an exam under anesthesia and then excise the hemorrhoids particularly on the right side.     Review of Systems: See HPI as well for other ROS.   ROS      Medical History: Past Medical History      Past Medical History:  Diagnosis Date   Arthritis          There is no problem list on file for this patient.     Past Surgical History  History reviewed. No pertinent surgical history.      Allergies       Allergies  Allergen Reactions   Aspirin Other (See Comments)      REACTION:  nauseated, drooling        No current outpatient medications on file prior to visit.    No current facility-administered medications on file prior to visit.      Family History       Family History  Problem Relation Age of Onset   Breast cancer Mother          Social History        Tobacco Use  Smoking Status Former   Types: Cigarettes  Smokeless Tobacco Never      Social History  Social History         Socioeconomic History   Marital status: Married  Tobacco Use   Smoking status: Former      Types: Cigarettes   Smokeless tobacco: Never  Vaping Use   Vaping Use: Never used  Substance and Sexual Activity   Alcohol use: Yes   Drug use: Never        Objective:         Vitals:    03/18/22 1404  BP: 136/78  Pulse: 84  SpO2: 96%  Weight: 97.2 kg (214 lb 3.2 oz)  Height: 179.1 cm (5' 10.5")    Body mass index is 30.3 kg/m.  Physical Exam General: Well maintained pleasant white male no acute distress HEENT  : Unremarkable except for glasses Chest: Clear Heart: Sinus rhythm without murmurs or gallops Breast: Not examined Abdomen: Nontender GU large scar in the right side were he had both sides managed from 1 incision. Rectal on rectal exam he has prolapse of hemorrhoids on the right side at rest.  No bleeding is noted.  On manual examination he has no fissure that I can elicit pain from.  There are no rectal masses other than the hemorrhoids described above.  I did reduce the hemorrhoids. Extremities Limited range of motion from the knee where he appears to have a bucket-handle tear Neuro alert and oriented x3.  Motor and sensory function is grossly intact.         Labs, Imaging and Diagnostic Testing: Nothing to review   Assessment and Plan:  Diagnoses and all orders for this visit:   Grade III hemorrhoids       There is prominent hemorrhoids on the right side at rest.  I suspect he will have much bigger hemorrhoids with prolapse with  Valsalva.  I discussed exam under anesthesia with hemorrhoidectomy with him.  We will go ahead and schedule at his convenience.   Patient seen in holding area and questions answered.  For EUA and hemorrhoidectomy   Bula Cavalieri Donia Pounds, MD

## 2022-05-15 ENCOUNTER — Ambulatory Visit (HOSPITAL_COMMUNITY)
Admission: RE | Admit: 2022-05-15 | Discharge: 2022-05-15 | Disposition: A | Discharge status: Home / Self Care | Payer: Medicare Other | Source: Ambulatory Visit | Attending: Surgery | Admitting: Surgery

## 2022-05-15 ENCOUNTER — Encounter (HOSPITAL_COMMUNITY)
Admission: RE | Disposition: A | Discharge status: Home / Self Care | Payer: Self-pay | Source: Ambulatory Visit | Attending: Surgery

## 2022-05-15 ENCOUNTER — Ambulatory Visit (HOSPITAL_COMMUNITY): Payer: Medicare Other | Admitting: Physician Assistant

## 2022-05-15 ENCOUNTER — Ambulatory Visit (HOSPITAL_BASED_OUTPATIENT_CLINIC_OR_DEPARTMENT_OTHER): Payer: Medicare Other | Admitting: Certified Registered Nurse Anesthetist

## 2022-05-15 ENCOUNTER — Encounter (HOSPITAL_COMMUNITY): Payer: Self-pay | Admitting: Surgery

## 2022-05-15 ENCOUNTER — Other Ambulatory Visit: Payer: Self-pay

## 2022-05-15 DIAGNOSIS — K642 Third degree hemorrhoids: Secondary | ICD-10-CM | POA: Diagnosis not present

## 2022-05-15 DIAGNOSIS — F32A Depression, unspecified: Secondary | ICD-10-CM | POA: Insufficient documentation

## 2022-05-15 DIAGNOSIS — I2510 Atherosclerotic heart disease of native coronary artery without angina pectoris: Secondary | ICD-10-CM | POA: Diagnosis not present

## 2022-05-15 DIAGNOSIS — K648 Other hemorrhoids: Secondary | ICD-10-CM | POA: Diagnosis not present

## 2022-05-15 DIAGNOSIS — Z87891 Personal history of nicotine dependence: Secondary | ICD-10-CM | POA: Insufficient documentation

## 2022-05-15 DIAGNOSIS — R7303 Prediabetes: Secondary | ICD-10-CM

## 2022-05-15 DIAGNOSIS — Z86010 Personal history of colonic polyps: Secondary | ICD-10-CM

## 2022-05-15 DIAGNOSIS — K219 Gastro-esophageal reflux disease without esophagitis: Secondary | ICD-10-CM | POA: Diagnosis not present

## 2022-05-15 HISTORY — PX: HEMORRHOID SURGERY: SHX153

## 2022-05-15 SURGERY — HEMORRHOIDECTOMY
Anesthesia: General

## 2022-05-15 MED ORDER — LACTATED RINGERS IV SOLN: INTRAVENOUS | Status: DC

## 2022-05-15 MED ORDER — POVIDONE-IODINE 10 % EX OINT: 1.0000 | TOPICAL_OINTMENT | CUTANEOUS | Status: DC | PRN

## 2022-05-15 MED ORDER — FENTANYL CITRATE (PF) 100 MCG/2ML IJ SOLN
INTRAMUSCULAR | Status: AC
  Filled 2022-05-15: qty 2

## 2022-05-15 MED ORDER — CHLORHEXIDINE GLUCONATE CLOTH 2 % EX PADS: 6.0000 | MEDICATED_PAD | Freq: Once | CUTANEOUS | Status: DC

## 2022-05-15 MED ORDER — AMISULPRIDE (ANTIEMETIC) 5 MG/2ML IV SOLN: 10.0000 mg | Freq: Once | INTRAVENOUS | Status: DC | PRN

## 2022-05-15 MED ORDER — PROPOFOL 500 MG/50ML IV EMUL
INTRAVENOUS | Status: AC
  Filled 2022-05-15: qty 50

## 2022-05-15 MED ORDER — DEXAMETHASONE SODIUM PHOSPHATE 10 MG/ML IJ SOLN
INTRAMUSCULAR | Status: AC
  Filled 2022-05-15: qty 1

## 2022-05-15 MED ORDER — ACETAMINOPHEN 500 MG PO TABS
1000.0000 mg | ORAL_TABLET | ORAL | Status: AC
  Administered 2022-05-15: 1000 mg via ORAL
  Filled 2022-05-15: qty 2

## 2022-05-15 MED ORDER — PROPOFOL 10 MG/ML IV BOLUS
INTRAVENOUS | Status: AC
  Filled 2022-05-15: qty 20

## 2022-05-15 MED ORDER — EPHEDRINE SULFATE-NACL 50-0.9 MG/10ML-% IV SOSY
PREFILLED_SYRINGE | INTRAVENOUS | Status: DC | PRN
  Administered 2022-05-15 (×4): 10 mg via INTRAVENOUS

## 2022-05-15 MED ORDER — ONDANSETRON HCL 4 MG/2ML IJ SOLN: 4.0000 mg | Freq: Once | INTRAMUSCULAR | Status: DC | PRN

## 2022-05-15 MED ORDER — SODIUM CHLORIDE (PF) 0.9 % IJ SOLN
INTRAMUSCULAR | Status: AC
  Filled 2022-05-15: qty 10

## 2022-05-15 MED ORDER — SODIUM CHLORIDE (PF) 0.9 % IJ SOLN
INTRAMUSCULAR | Status: DC | PRN
  Administered 2022-05-15: 10 mL

## 2022-05-15 MED ORDER — SCOPOLAMINE 1 MG/3DAYS TD PT72: 1.0000 | MEDICATED_PATCH | TRANSDERMAL | Status: DC

## 2022-05-15 MED ORDER — ORAL CARE MOUTH RINSE: 15.0000 mL | Freq: Once | OROMUCOSAL | Status: AC

## 2022-05-15 MED ORDER — BUPIVACAINE LIPOSOME 1.3 % IJ SUSP
INTRAMUSCULAR | Status: DC | PRN
  Administered 2022-05-15: 20 mL

## 2022-05-15 MED ORDER — POVIDONE-IODINE 10 % EX OINT
1.0000 | TOPICAL_OINTMENT | CUTANEOUS | Status: DC | PRN
  Administered 2022-05-15: 1 via TOPICAL

## 2022-05-15 MED ORDER — MIDAZOLAM HCL 2 MG/2ML IJ SOLN
INTRAMUSCULAR | Status: AC
  Filled 2022-05-15: qty 2

## 2022-05-15 MED ORDER — PROPOFOL 10 MG/ML IV BOLUS
INTRAVENOUS | Status: DC | PRN
  Administered 2022-05-15: 200 mg via INTRAVENOUS

## 2022-05-15 MED ORDER — FENTANYL CITRATE (PF) 100 MCG/2ML IJ SOLN
INTRAMUSCULAR | Status: DC | PRN
  Administered 2022-05-15: 50 ug via INTRAVENOUS

## 2022-05-15 MED ORDER — EPHEDRINE 5 MG/ML INJ
INTRAVENOUS | Status: AC
  Filled 2022-05-15: qty 10

## 2022-05-15 MED ORDER — LIDOCAINE 2% (20 MG/ML) 5 ML SYRINGE
INTRAMUSCULAR | Status: DC | PRN
  Administered 2022-05-15: 60 mg via INTRAVENOUS

## 2022-05-15 MED ORDER — POVIDONE-IODINE 10 % EX OINT
TOPICAL_OINTMENT | CUTANEOUS | Status: AC
  Filled 2022-05-15: qty 28.35

## 2022-05-15 MED ORDER — CHLORHEXIDINE GLUCONATE 0.12 % MT SOLN
15.0000 mL | Freq: Once | OROMUCOSAL | Status: AC
  Administered 2022-05-15: 15 mL via OROMUCOSAL

## 2022-05-15 MED ORDER — ONDANSETRON HCL 4 MG/2ML IJ SOLN
INTRAMUSCULAR | Status: AC
  Filled 2022-05-15: qty 2

## 2022-05-15 MED ORDER — CEFAZOLIN SODIUM-DEXTROSE 2-4 GM/100ML-% IV SOLN
2.0000 g | INTRAVENOUS | Status: AC
  Administered 2022-05-15: 2 g via INTRAVENOUS
  Filled 2022-05-15: qty 100

## 2022-05-15 MED ORDER — OXYCODONE HCL 5 MG PO TABS: 5.0000 mg | ORAL_TABLET | Freq: Four times a day (QID) | ORAL | 0 refills | Status: DC | PRN

## 2022-05-15 MED ORDER — FLEET ENEMA 7-19 GM/118ML RE ENEM
1.0000 | ENEMA | Freq: Once | RECTAL | Status: DC
  Filled 2022-05-15: qty 1

## 2022-05-15 MED ORDER — FENTANYL CITRATE PF 50 MCG/ML IJ SOSY: 25.0000 ug | PREFILLED_SYRINGE | INTRAMUSCULAR | Status: DC | PRN

## 2022-05-15 MED ORDER — ONDANSETRON HCL 4 MG/2ML IJ SOLN
INTRAMUSCULAR | Status: DC | PRN
  Administered 2022-05-15: 4 mg via INTRAVENOUS

## 2022-05-15 MED ORDER — DEXAMETHASONE SODIUM PHOSPHATE 10 MG/ML IJ SOLN
INTRAMUSCULAR | Status: DC | PRN
  Administered 2022-05-15: 5 mg via INTRAVENOUS

## 2022-05-15 MED ORDER — 0.9 % SODIUM CHLORIDE (POUR BTL) OPTIME
TOPICAL | Status: DC | PRN
  Administered 2022-05-15: 1000 mL

## 2022-05-15 MED ORDER — BUPIVACAINE LIPOSOME 1.3 % IJ SUSP
INTRAMUSCULAR | Status: AC
  Filled 2022-05-15: qty 20

## 2022-05-15 SURGICAL SUPPLY — 28 items
BAG COUNTER SPONGE SURGICOUNT (BAG) IMPLANT
BAG SPNG CNTER NS LX DISP (BAG)
BLADE HEX COATED 2.75 (ELECTRODE) ×1 IMPLANT
BLADE SURG 15 STRL LF DISP TIS (BLADE) ×1 IMPLANT
BLADE SURG 15 STRL SS (BLADE) ×1
BRIEF MESH DISP LRG (UNDERPADS AND DIAPERS) ×1 IMPLANT
COVER SURGICAL LIGHT HANDLE (MISCELLANEOUS) ×1 IMPLANT
ELECT REM PT RETURN 15FT ADLT (MISCELLANEOUS) ×1 IMPLANT
GAUZE 4X4 16PLY ~~LOC~~+RFID DBL (SPONGE) ×1 IMPLANT
GAUZE PAD ABD 8X10 STRL (GAUZE/BANDAGES/DRESSINGS) IMPLANT
GAUZE SPONGE 4X4 12PLY STRL (GAUZE/BANDAGES/DRESSINGS) ×1 IMPLANT
GLOVE SURG LX STRL 8.0 MICRO (GLOVE) ×1 IMPLANT
GOWN SPEC L4 XLG W/TWL (GOWN DISPOSABLE) ×1 IMPLANT
GOWN STRL REUS W/ TWL XL LVL3 (GOWN DISPOSABLE) ×3 IMPLANT
GOWN STRL REUS W/TWL XL LVL3 (GOWN DISPOSABLE) ×3
KIT BASIN OR (CUSTOM PROCEDURE TRAY) ×1 IMPLANT
KIT TURNOVER KIT A (KITS) IMPLANT
NEEDLE HYPO 22GX1.5 SAFETY (NEEDLE) ×1 IMPLANT
PACK LITHOTOMY IV (CUSTOM PROCEDURE TRAY) ×1 IMPLANT
PENCIL SMOKE EVACUATOR (MISCELLANEOUS) IMPLANT
SHEARS HARMONIC 9CM CVD (BLADE) IMPLANT
SPIKE FLUID TRANSFER (MISCELLANEOUS) ×1 IMPLANT
SPONGE HEMORRHOID 8X3CM (HEMOSTASIS) IMPLANT
SURGILUBE 2OZ TUBE FLIPTOP (MISCELLANEOUS) ×1 IMPLANT
SUT CHROMIC 2 0 SH (SUTURE) IMPLANT
SUT CHROMIC 3 0 SH 27 (SUTURE) IMPLANT
SYR 20ML LL LF (SYRINGE) ×1 IMPLANT
UNDERPAD 30X36 HEAVY ABSORB (UNDERPADS AND DIAPERS) ×1 IMPLANT

## 2022-05-15 NOTE — Anesthesia Postprocedure Evaluation (Signed)
Anesthesia Post Note  Patient: Anthony King  Procedure(s) Performed: RECTAL EXAM UNDER ANESTHESIA WITH HEMORRHOIDECTOMY     Patient location during evaluation: PACU Anesthesia Type: General Level of consciousness: awake Pain management: pain level controlled Vital Signs Assessment: post-procedure vital signs reviewed and stable Respiratory status: spontaneous breathing, nonlabored ventilation and respiratory function stable Cardiovascular status: blood pressure returned to baseline and stable Postop Assessment: no apparent nausea or vomiting Anesthetic complications: no   No notable events documented.  Last Vitals:  Vitals:   05/15/22 1115 05/15/22 1137  BP: 138/81 (!) 145/76  Pulse: 74 76  Resp: 12 16  Temp: 36.6 C 36.7 C  SpO2: 99% 99%    Last Pain:  Vitals:   05/15/22 1137  TempSrc:   PainSc: 0-No pain                 Myiesha Edgar P Polk Minor

## 2022-05-15 NOTE — Op Note (Signed)
Anthony King  September 24, 1950   05/15/2022    PCP:  Ann Held, DO   Surgeon: Kaylyn Lim, MD, FACS  Asst:  Winfield Rast, MD  Anes:  General LMA  Preop Dx: Prolapsing hemorroids on the right Postop Dx: Multiple hemorrhoids worse on the right  Procedure: EUA with hemorrhoidectomy (x4) Location Surgery: [facility] Complications: None noted  EBL:   5 cc  Drains: none  Description of Procedure:  The patient was taken to OR 4 .  After anesthesia was administered and the patient was prepped  with Hibiclens  and a timeout was performed.  Patient was in dorsal lithotomy. EUA was performed and the main hemorrhoid was at 7 oclock.  Others at 10, 5 and 2 were addressed.  At 7:00 this hemorrhoid was lifted and asked her exteriorly and the plane developed underneath hemorrhoidal column in the max Was used to remove the external and internal components.  Similarly at the 5 o'clock position this was addressed as well.  At 2 and 10 there were internal components were grasped internally and removed with the harmonic only.  Chromics were used at a 5 and 7 to approximate the edges of the skin at the anal verge.  30 cc of Exparel, 20 with 10 of saline was injected circumferentially around the entire anus at the beginning of the case.  There was a clean prep present.  At the end we had to wait for pharmacy to get a some Betadine ointment to massage into the surgical field prior to placing a pad and mesh panties.  Hemostasis was present.  The patient tolerated the procedure well and was taken to the PACU in stable condition.     Matt B. Hassell Done, [place], [facility], [place]

## 2022-05-15 NOTE — Interval H&P Note (Signed)
History and Physical Interval Note:  05/15/2022 9:06 AM  Anthony King  has presented today for surgery, with the diagnosis of PROLAPSING HEMORRHOIDECTOMY.  The various methods of treatment have been discussed with the patient and family. After consideration of risks, benefits and other options for treatment, the patient has consented to  Procedure(s): RECTAL EXAM UNDER ANESTHESIA WITH HEMORRHOIDECTOMY (N/A) as a surgical intervention.  The patient's history has been reviewed, patient examined, no change in status, stable for surgery.  I have reviewed the patient's chart and labs.  Questions were answered to the patient's satisfaction.     Anthony King

## 2022-05-15 NOTE — Anesthesia Preprocedure Evaluation (Addendum)
Anesthesia Evaluation  Patient identified by MRN, date of birth, ID band Patient awake    Reviewed: Allergy & Precautions, NPO status , Patient's Chart, lab work & pertinent test results  Airway Mallampati: II  TM Distance: >3 FB Neck ROM: Full    Dental  (+) Missing   Pulmonary former smoker   Pulmonary exam normal        Cardiovascular + CAD  Normal cardiovascular exam     Neuro/Psych  Headaches PSYCHIATRIC DISORDERS  Depression       GI/Hepatic Neg liver ROS, PUD,GERD  Medicated and Controlled,,  Endo/Other  negative endocrine ROS    Renal/GU Renal disease     Musculoskeletal  (+) Arthritis ,    Abdominal   Peds  Hematology negative hematology ROS (+)   Anesthesia Other Findings PROLAPSING HEMORRHOIDECTOMY  Reproductive/Obstetrics                             Anesthesia Physical Anesthesia Plan  ASA: 2  Anesthesia Plan: General   Post-op Pain Management:    Induction: Intravenous  PONV Risk Score and Plan: 3 and Ondansetron, Dexamethasone and Treatment may vary due to age or medical condition  Airway Management Planned: LMA  Additional Equipment:   Intra-op Plan:   Post-operative Plan: Extubation in OR  Informed Consent: I have reviewed the patients History and Physical, chart, labs and discussed the procedure including the risks, benefits and alternatives for the proposed anesthesia with the patient or authorized representative who has indicated his/her understanding and acceptance.     Dental advisory given  Plan Discussed with: CRNA  Anesthesia Plan Comments:        Anesthesia Quick Evaluation

## 2022-05-15 NOTE — Transfer of Care (Signed)
Immediate Anesthesia Transfer of Care Note  Patient: Anthony King  Procedure(s) Performed: RECTAL EXAM UNDER ANESTHESIA WITH HEMORRHOIDECTOMY  Patient Location: PACU  Anesthesia Type:General  Level of Consciousness: drowsy and patient cooperative  Airway & Oxygen Therapy: Patient Spontanous Breathing and Patient connected to face mask oxygen  Post-op Assessment: Report given to RN and Post -op Vital signs reviewed and stable  Post vital signs: Reviewed and stable  Last Vitals:  Vitals Value Taken Time  BP 147/82 05/15/22 1039  Temp    Pulse 90 05/15/22 1044  Resp 21 05/15/22 1044  SpO2 100 % 05/15/22 1044  Vitals shown include unvalidated device data.  Last Pain:  Vitals:   05/15/22 0722  TempSrc: Oral  PainSc: 0-No pain      Patients Stated Pain Goal: 3 (37/34/28 7681)  Complications: No notable events documented.

## 2022-05-15 NOTE — Anesthesia Procedure Notes (Addendum)
Procedure Name: LMA Insertion Date/Time: 05/15/2022 9:30 AM  Performed by: West Pugh, CRNAPre-anesthesia Checklist: Patient identified, Emergency Drugs available, Suction available, Patient being monitored and Timeout performed Patient Re-evaluated:Patient Re-evaluated prior to induction Oxygen Delivery Method: Circle system utilized Preoxygenation: Pre-oxygenation with 100% oxygen Induction Type: IV induction LMA: LMA inserted LMA Size: 5.0 Number of attempts: 2 Placement Confirmation: positive ETCO2 Tube secured with: Tape Dental Injury: Teeth and Oropharynx as per pre-operative assessment  Comments: LMA #4 with gastric port placed initially, large leak. LMA #5 placed and seated.

## 2022-05-16 ENCOUNTER — Encounter (HOSPITAL_COMMUNITY): Payer: Self-pay | Admitting: Surgery

## 2022-05-19 LAB — SURGICAL PATHOLOGY

## 2022-07-03 DIAGNOSIS — H2511 Age-related nuclear cataract, right eye: Secondary | ICD-10-CM | POA: Diagnosis not present

## 2022-07-03 DIAGNOSIS — H25041 Posterior subcapsular polar age-related cataract, right eye: Secondary | ICD-10-CM | POA: Diagnosis not present

## 2022-07-03 DIAGNOSIS — H2512 Age-related nuclear cataract, left eye: Secondary | ICD-10-CM | POA: Diagnosis not present

## 2022-07-17 DIAGNOSIS — H2512 Age-related nuclear cataract, left eye: Secondary | ICD-10-CM | POA: Diagnosis not present

## 2022-08-27 ENCOUNTER — Encounter: Payer: Self-pay | Admitting: Gastroenterology

## 2022-09-14 ENCOUNTER — Encounter: Payer: Self-pay | Admitting: Gastroenterology

## 2022-09-17 DIAGNOSIS — Z08 Encounter for follow-up examination after completed treatment for malignant neoplasm: Secondary | ICD-10-CM | POA: Diagnosis not present

## 2022-09-17 DIAGNOSIS — D225 Melanocytic nevi of trunk: Secondary | ICD-10-CM | POA: Diagnosis not present

## 2022-09-17 DIAGNOSIS — L814 Other melanin hyperpigmentation: Secondary | ICD-10-CM | POA: Diagnosis not present

## 2022-09-17 DIAGNOSIS — Z85828 Personal history of other malignant neoplasm of skin: Secondary | ICD-10-CM | POA: Diagnosis not present

## 2022-09-17 DIAGNOSIS — L821 Other seborrheic keratosis: Secondary | ICD-10-CM | POA: Diagnosis not present

## 2022-09-17 DIAGNOSIS — D492 Neoplasm of unspecified behavior of bone, soft tissue, and skin: Secondary | ICD-10-CM | POA: Diagnosis not present

## 2022-09-21 DIAGNOSIS — D492 Neoplasm of unspecified behavior of bone, soft tissue, and skin: Secondary | ICD-10-CM | POA: Diagnosis not present

## 2022-10-06 ENCOUNTER — Telehealth: Payer: Self-pay | Admitting: Family Medicine

## 2022-10-06 NOTE — Telephone Encounter (Signed)
Copied from CRM [id number]. Topic: Medicare AWV >> Oct 06, 2022 10:58 AM Payton Doughty wrote: Reason for CRM: Called patient to schedule Medicare Annual Wellness Visit (AWV). Left message for patient to call back and schedule Medicare Annual Wellness Visit (AWV).  Last date of AWV: 10/13/21  Please schedule an appointment at any time with Donne Anon, CMA    If any questions, please contact me at     Thank you ,  Verlee Rossetti; Care Guide Ambulatory Clinical Support [place] [facility] Direct Dial: [phone]

## 2022-10-15 ENCOUNTER — Other Ambulatory Visit: Payer: Self-pay

## 2022-10-15 ENCOUNTER — Ambulatory Visit (AMBULATORY_SURGERY_CENTER): Payer: Medicare Other

## 2022-10-15 VITALS — Ht 70.0 in | Wt 220.0 lb

## 2022-10-15 DIAGNOSIS — Z86010 Personal history of colonic polyps: Secondary | ICD-10-CM

## 2022-10-15 MED ORDER — PLENVU 140 G PO SOLR: 1.0000 | ORAL | 0 refills | Status: DC

## 2022-10-15 MED ORDER — PLENVU 140 G PO SOLR: 1.0000 | ORAL | Status: DC

## 2022-10-15 NOTE — Progress Notes (Signed)
As Rx for Plenvu printed at the office; RN called and spoke with pharmacist concerning RX info and Plenvu Medicare coupon info; Pharmacist reports RX came into pharmacy but did not have coupon information listed;  coupon information given and price for patient to pay out of pocket $60.00;

## 2022-10-15 NOTE — Progress Notes (Signed)
No egg or soy allergy known to patient  No issues known to pt with past sedation with any surgeries or procedures Patient denies ever being told they had issues or difficulty with intubation  No FH of Malignant Hyperthermia Pt is not on diet pills Pt is not on  home 02  Pt is not on blood thinners  Pt denies issues with constipation  No A fib or A flutter Have any cardiac testing pending--no Pt instructed to use [email or web address] or GoodRx for a price reduction on prep   

## 2022-10-15 NOTE — Addendum Note (Signed)
Addended by: Johnney Killian on: 10/15/2022 12:34 PM   Modules accepted: Orders

## 2022-11-05 ENCOUNTER — Ambulatory Visit (AMBULATORY_SURGERY_CENTER): Payer: Medicare Other | Admitting: Gastroenterology

## 2022-11-05 ENCOUNTER — Encounter: Payer: Self-pay | Admitting: Gastroenterology

## 2022-11-05 VITALS — BP 114/70 | HR 65 | Temp 98.9°F | Resp 16 | Ht 70.0 in | Wt 220.0 lb

## 2022-11-05 DIAGNOSIS — Z09 Encounter for follow-up examination after completed treatment for conditions other than malignant neoplasm: Secondary | ICD-10-CM | POA: Diagnosis not present

## 2022-11-05 DIAGNOSIS — Z86010 Personal history of colonic polyps: Secondary | ICD-10-CM | POA: Diagnosis not present

## 2022-11-05 DIAGNOSIS — D123 Benign neoplasm of transverse colon: Secondary | ICD-10-CM

## 2022-11-05 DIAGNOSIS — R7303 Prediabetes: Secondary | ICD-10-CM | POA: Diagnosis not present

## 2022-11-05 DIAGNOSIS — D122 Benign neoplasm of ascending colon: Secondary | ICD-10-CM | POA: Diagnosis not present

## 2022-11-05 DIAGNOSIS — K635 Polyp of colon: Secondary | ICD-10-CM | POA: Diagnosis not present

## 2022-11-05 MED ORDER — SODIUM CHLORIDE 0.9 % IV SOLN: 500.0000 mL | Freq: Once | INTRAVENOUS | Status: DC

## 2022-11-05 NOTE — Op Note (Signed)
[facility] Patient Name: Anthony King Procedure Date: 11/05/2022 8:30 AM MRN: [id number] Endoscopist: Viviann Spare P. Adela Lank , MD, [id number] Age: 72 Referring MD:  Date of Birth: 1951/04/11 Gender: Male Account #: [id number] Procedure:                Colonoscopy Indications:              High risk colon cancer surveillance: Personal                            history of colonic polyps - 10 polyps removed 08/2019 Medicines:                Monitored Anesthesia Care Procedure:                Pre-Anesthesia Assessment:                           - Prior to the procedure, a History and Physical                            was performed, and patient medications and                            allergies were reviewed. The patient's tolerance of                            previous anesthesia was also reviewed. The risks                            and benefits of the procedure and the sedation                            options and risks were discussed with the patient.                            All questions were answered, and informed consent                            was obtained. Prior Anticoagulants: The patient has                            taken no anticoagulant or antiplatelet agents. ASA                            Grade Assessment: III - A patient with severe                            systemic disease. After reviewing the risks and                            benefits, the patient was deemed in satisfactory                            condition to undergo the procedure.  After obtaining informed consent, the colonoscope                            was passed under direct vision. Throughout the                            procedure, the patient's blood pressure, pulse, and                            oxygen saturations were monitored continuously. The                            PCF-HQ190L Colonoscope [id number] was introduced                             through the anus and advanced to the the cecum,                            identified by appendiceal orifice and ileocecal                            valve. The colonoscopy was performed without                            difficulty. The patient tolerated the procedure                            well. The quality of the bowel preparation was                            good. The ileocecal valve, appendiceal orifice, and                            rectum were photographed. Scope In: 8:33:29 AM Scope Out: 8:53:27 AM Scope Withdrawal Time: 0 hours 14 minutes 50 seconds  Total Procedure Duration: 0 hours 19 minutes 58 seconds  Findings:                 Small skin tags were found on perianal exam.                           A diminutive polyp was found in the ascending                            colon. The polyp was sessile. The polyp was removed                            with a cold snare. Resection and retrieval were                            complete.                           Four sessile polyps were found in the transverse  colon. The polyps were 3 mm in size. These polyps                            were removed with a cold snare. Resection and                            retrieval were complete.                           Multiple small-mouthed diverticula were found in                            the sigmoid colon.                           Internal hemorrhoids were found with scarring from                            prior hemorrhoidectomy / banding.                           The exam was otherwise without abnormality. Complications:            Patient had an episode of vomiting just prior to                            the end of procedure during withdrawal in sigmoid                            colon, no overt / obvious aspiration, managed per                            anesthesia Estimated blood loss: Minimal. Estimated Blood Loss:     Estimated blood loss was  minimal. Impression:               - Perianal skin tags found on perianal exam.                           - One diminutive polyp in the ascending colon,                            removed with a cold snare. Resected and retrieved.                           - Four 3 mm polyps in the transverse colon, removed                            with a cold snare. Resected and retrieved.                           - Diverticulosis in the sigmoid colon.                           - Internal hemorrhoids.                           -  The examination was otherwise normal.                           - The GI Genius (intelligent endoscopy module),                            computer-aided polyp detection system powered by AI                            was utilized to detect colorectal polyps through                            enhanced visualization during colonoscopy. Recommendation:           - Patient has a contact number available for                            emergencies. The signs and symptoms of potential                            delayed complications were discussed with the                            patient. Return to normal activities tomorrow.                            Written discharge instructions were provided to the                            patient.                           - Resume previous diet.                           - Continue present medications.                           - Await pathology results. Viviann Spare P. Sahily Biddle, MD 11/05/2022 9:00:51 AM This report has been signed electronically.

## 2022-11-05 NOTE — Patient Instructions (Addendum)
Resume previous diet Continue present medications Await pathology results  Handouts/information given for polyps, diverticulosis  YOU HAD AN ENDOSCOPIC PROCEDURE TODAY AT THE [facility]:   Refer to the procedure report that was given to you for any specific questions about what was found during the examination.  If the procedure report does not answer your questions, please call your gastroenterologist to clarify.  If you requested that your care partner not be given the details of your procedure findings, then the procedure report has been included in a sealed envelope for you to review at your convenience later.  YOU SHOULD EXPECT: Some feelings of bloating in the abdomen. Passage of more gas than usual.  Walking can help get rid of the air that was put into your GI tract during the procedure and reduce the bloating. If you had a lower endoscopy (such as a colonoscopy or flexible sigmoidoscopy) you may notice spotting of blood in your stool or on the toilet paper. If you underwent a bowel prep for your procedure, you may not have a normal bowel movement for a few days.  Please Note:  You might notice some irritation and congestion in your nose or some drainage.  This is from the oxygen used during your procedure.  There is no need for concern and it should clear up in a day or so.  SYMPTOMS TO REPORT IMMEDIATELY:  Following lower endoscopy (colonoscopy):  Excessive amounts of blood in the stool  Significant tenderness or worsening of abdominal pains  Swelling of the abdomen that is new, acute  Fever of 100F or higher  For urgent or emergent issues, a gastroenterologist can be reached at any hour by calling [phone]. Do not use MyChart messaging for urgent concerns.    DIET:  We do recommend a small meal at first, but then you may proceed to your regular diet.  Drink plenty of fluids but you should avoid alcoholic beverages for 24 hours.  ACTIVITY:  You should plan to  take it easy for the rest of today and you should NOT DRIVE or use heavy machinery until tomorrow (because of the sedation medicines used during the test).    FOLLOW UP: Our staff will call the number listed on your records the next business day following your procedure.  We will call around 7:15- 8:00 am to check on you and address any questions or concerns that you may have regarding the information given to you following your procedure. If we do not reach you, we will leave a message.     If any biopsies were taken you will be contacted by phone or by letter within the next 1-3 weeks.  Please call us at [phone] if you have not heard about the biopsies in 3 weeks.    SIGNATURES/CONFIDENTIALITY: You and/or your care partner have signed paperwork which will be entered into your electronic medical record.  These signatures attest to the fact that that the information above on your After Visit Summary has been reviewed and is understood.  Full responsibility of the confidentiality of this discharge information lies with you and/or your care-partner.

## 2022-11-05 NOTE — Progress Notes (Signed)
[facility] History and Physical   Primary Care Physician:  Anthony King, Anthony Congress, DO   Reason for Procedure:   History of colon polyps  Plan:    colonoscopy     HPI: Anthony King is a 72 y.o. male  here for colonoscopy surveillance - 6 adenomas removed 2018, 10 polyps removed 08/2019. S/p hemorrhoidectomy in the past year   Patient denies any bowel symptoms at this time. No family history of colon cancer known. Otherwise feels well without any cardiopulmonary symptoms today.   I have discussed risks / benefits of anesthesia and endoscopic procedure with Anthony King and they wish to proceed with the exams as outlined today.    Past Medical History:  Diagnosis Date   Arthritis 07/17/2011   hands, hips, knees   Basal cell carcinoma    nose   Colon polyps    ADENOMATOUS AND HYPERPLASTIC POLYPS   Coronary artery disease    COVID-19    Diverticulosis    DOE (dyspnea on exertion) 12/07/2019   Onset with covid 19 pna  - see adimit 10/18/19  -  12/07/2019   Walked RA  3 laps @ approx 264ft each @ slow slt unsteady  pace  stopped due to sob / weakness but sats never less than 92%    Dyspnea    with exertion   Gastric ulcer    GERD (gastroesophageal reflux disease)    Headache    Hemorrhoids    History of kidney stones    Hypoxia 10/18/2019   Left lower quadrant abdominal pain 05/31/2018   Pneumonia    Pre-diabetes    Wears glasses     Past Surgical History:  Procedure Laterality Date   APPENDECTOMY     BASAL CELL CARCINOMA EXCISION     nose   CARDIAC CATHETERIZATION     COLONOSCOPY     EXCISION OF MESH Right 02/15/2020   Procedure: EXPLANTATION OF MESH;  Surgeon: Harriette Bouillon, MD;  Location: [facility] OR;  Service: General;  Laterality: Right;   fatty tumors  07/17/2011   excised x4- 1 -neck, 1 arm, 2 chest   GROIN DISSECTION Right 02/15/2020   Procedure: RIGHT GROIN NEURECTOMY;  Surgeon: Harriette Bouillon, MD;  Location: [facility] OR;  Service: General;   Laterality: Right;  DR Leeanne Mannan APPROVED CORNETT USING HIS TIME PER ABBIE   HEMORRHOID BANDING     HEMORRHOID SURGERY N/A 05/15/2022   Procedure: RECTAL EXAM UNDER ANESTHESIA WITH HEMORRHOIDECTOMY;  Surgeon: Luretha Murphy, MD;  Location: [facility];  Service: General;  Laterality: N/A;   INGUINAL HERNIA REPAIR  07/23/2011   Procedure: LAPAROSCOPIC INGUINAL HERNIA;  Surgeon: Almond Lint, MD;  Location: [facility];  Service: General;  Laterality: Left;   INGUINAL HERNIA REPAIR Bilateral 04/19/2019   Procedure: BILATERAL OPEN INGUINAL HERNIA REPAIR WITH MESH;  Surgeon: Harriette Bouillon, MD;  Location: [facility];  Service: General;  Laterality: Bilateral;   INGUINAL HERNIA REPAIR Right 02/15/2020   Procedure: RIGHT HERNIA REPAIR INGUINAL WITH MESH;  Surgeon: Harriette Bouillon, MD;  Location: [facility];  Service: General;  Laterality: Right;   KNEE ARTHROSCOPY Left    LAPAROSCOPY N/A 02/15/2020   Procedure: LAPAROSCOPY DIAGNOSTIC;  Surgeon: Harriette Bouillon, MD;  Location: [facility];  Service: General;  Laterality: N/A;   LEFT HEART CATH AND CORONARY ANGIOGRAPHY N/A 03/21/2018   Procedure: LEFT HEART CATH AND CORONARY ANGIOGRAPHY;  Surgeon: Corky Crafts, MD;  Location: [facility];  Service: Cardiovascular;  Laterality:  N/A;   MASS EXCISION N/A 08/29/2013   Procedure: EXCISION LEFT FLANK MASS/RIGHT EXCISION CHEST WALL MASS;  Surgeon: Almond Lint, MD;  Location: [facility];  Service: General;  Laterality: N/A;  Left flank   POLYPECTOMY     TONSILLECTOMY      Prior to Admission medications   Medication Sig Start Date End Date Taking? Authorizing Provider  lansoprazole (PREVACID) 15 MG capsule Take 15 mg by mouth daily at 12 noon.   Yes [provider]  vitamin B-12 (CYANOCOBALAMIN) 1000 MCG tablet Take 1,000 mcg by mouth daily.    Yes [provider]  oxyCODONE (OXY IR/ROXICODONE) 5 MG immediate release tablet Take 1 tablet (5 mg total) by mouth every  6 (six) hours as needed for severe pain. Patient not taking: Reported on 10/15/2022 05/15/22   Luretha Murphy, MD    Current Outpatient Medications  Medication Sig Dispense Refill   lansoprazole (PREVACID) 15 MG capsule Take 15 mg by mouth daily at 12 noon.     vitamin B-12 (CYANOCOBALAMIN) 1000 MCG tablet Take 1,000 mcg by mouth daily.      oxyCODONE (OXY IR/ROXICODONE) 5 MG immediate release tablet Take 1 tablet (5 mg total) by mouth every 6 (six) hours as needed for severe pain. (Patient not taking: Reported on 10/15/2022) 15 tablet 0   Current Facility-Administered Medications  Medication Dose Route Frequency Provider Last Rate Last Admin   0.9 %  sodium chloride infusion  500 mL Intravenous Once Jamarco Zaldivar, Willaim Rayas, MD        Allergies as of 11/05/2022 - Review Complete 11/05/2022  Allergen Reaction Noted   Aspirin Other (See Comments) 06/14/2009    Family History  Problem Relation Age of Onset   Arthritis Mother    Breast cancer Mother    Pneumonia Mother    Alzheimer's disease Mother    Stomach cancer Father    Colon cancer Father    Stomach cancer Sister        Passed away 12/12   Colon cancer Brother    Arthritis Maternal Grandmother    Arthritis Paternal Grandmother    Esophageal cancer Neg Hx    Rectal cancer Neg Hx    Colon polyps Neg Hx     Social History   Socioeconomic History   Marital status: Married    Spouse name: Not on file   Number of children: 1   Years of education: Not on file   Highest education level: Not on file  Occupational History   Occupation: self-employed , covers furniture  Tobacco Use   Smoking status: Former    Types: Cigarettes    Start date: 1963    Quit date: 07/02/1991    Years since quitting: 31.3   Smokeless tobacco: Never  Vaping Use   Vaping Use: Never used  Substance and Sexual Activity   Alcohol use: No   Drug use: No   Sexual activity: Yes  Other Topics Concern   Not on file  Social History Narrative    Exercise- no   Right handed   No caffeine   One story home   Social Determinants of Health   Financial Resource Strain: Not on file  Food Insecurity: No Food Insecurity (08/30/2020)   Hunger Vital Sign    Worried About Running Out of Food in the Last Year: Never true    Ran Out of Food in the Last Year: Never true  Transportation Needs: No Transportation Needs (08/30/2020)   PRAPARE -  Administrator, Civil Service (Medical): No    Lack of Transportation (Non-Medical): No  Physical Activity: Not on file  Stress: Not on file  Social Connections: Not on file  Intimate Partner Violence: Not on file    Review of Systems: All other review of systems negative except as mentioned in the HPI.  Physical Exam: Vital signs BP 139/82   Pulse 66   Temp 98.9 F (37.2 C) (Temporal)   Ht 5\' 10"  (1.778 m)   Wt 220 lb (99.8 kg)   SpO2 98%   BMI 31.57 kg/m   General:   Alert,  Well-developed, pleasant and cooperative in NAD Lungs:  Clear throughout to auscultation.   Heart:  Regular rate and rhythm Abdomen:  Soft, nontender and nondistended.   Neuro/Psych:  Alert and cooperative. Normal mood and affect. A and O x 3  Harlin Rain, MD [facility]

## 2022-11-05 NOTE — Progress Notes (Signed)
Vss nad trans to pacu Pt vomited during procedure was immediately suctioned.

## 2022-11-05 NOTE — Progress Notes (Signed)
Called to room to assist during endoscopic procedure.  Patient ID and intended procedure confirmed with present staff. Received instructions for my participation in the procedure from the performing physician.  

## 2022-11-06 ENCOUNTER — Telehealth: Payer: Self-pay

## 2022-11-06 NOTE — Telephone Encounter (Signed)
  Follow up Call-     11/05/2022    7:45 AM  Call back number  Post procedure Call Back phone  # [phone]  Permission to leave phone message Yes     Patient questions:  Do you have a fever, pain , or abdominal swelling? No. Pain Score  0 *  Have you tolerated food without any problems? No.  Have you been able to return to your normal activities? Yes.    Do you have any questions about your discharge instructions: Diet   No. Medications  No. Follow up visit  No.  Do you have questions or concerns about your Care? Yes.     Pt states he had a pancake and 2 cups of coffee after the procedure and then vomited everything up afterwards.  States he has tolerated food since then. States he felt out of breath yesterday and coughed when he would take a deep breath, however, states he is breathing well today and denies SOB, states he did not call yesterday with any of these symptoms.  Encouraged pt to call today and this weekend if he has any concerns.  Pt states he feels much better today.  Actions: * If pain score is 4 or above: No action needed, pain <4.

## 2022-11-15 ENCOUNTER — Encounter: Payer: Self-pay | Admitting: Gastroenterology

## 2023-01-08 ENCOUNTER — Encounter: Payer: Self-pay | Admitting: Podiatry

## 2023-01-08 ENCOUNTER — Ambulatory Visit (INDEPENDENT_AMBULATORY_CARE_PROVIDER_SITE_OTHER): Payer: Medicare Other | Admitting: Podiatry

## 2023-01-08 DIAGNOSIS — L600 Ingrowing nail: Secondary | ICD-10-CM | POA: Diagnosis not present

## 2023-01-08 NOTE — Patient Instructions (Signed)

## 2023-01-08 NOTE — Progress Notes (Signed)
Subjective:   Patient ID: Anthony King, male   DOB: 72 y.o.   MRN: [id number]   HPI Patient states he had a nail removed around a year ago and has developed some regrowth on the central portion.  States it can be bothersome and can create irritation of tissue and like it removed   ROS      Objective:  Physical Exam  There are status intact with patient found to have regrowth of nail bed right dorsal hallux mid area with the lateral medial side looking excellent     Assessment:  Damaged hallux nail right     Plan:  Reviewed and I have recommended nail removal patient wants procedure bilaterally him to read consent form for correction explained procedure and he signed consent form.  Anesthetized the right hallux 60 mg like Marcaine mixture sterile prep done using sterile instrumentation remove the hallux nail exposed matrix applied phenol for applications 30 seconds followed by alcohol by sterile dressing gave instructions on soaks leave dressing on 24 hours take it off earlier if throbbing were to occur and encouraged to call questions concerns which may arise

## 2023-02-04 ENCOUNTER — Encounter: Payer: Medicare Other | Admitting: Family Medicine

## 2023-02-11 ENCOUNTER — Ambulatory Visit (HOSPITAL_BASED_OUTPATIENT_CLINIC_OR_DEPARTMENT_OTHER)
Admission: RE | Admit: 2023-02-11 | Discharge: 2023-02-11 | Disposition: A | Discharge status: Home / Self Care | Payer: Medicare Other | Source: Ambulatory Visit | Attending: Family Medicine | Admitting: Family Medicine

## 2023-02-11 ENCOUNTER — Ambulatory Visit: Payer: Medicare Other | Admitting: Family Medicine

## 2023-02-11 ENCOUNTER — Encounter: Payer: Self-pay | Admitting: Family Medicine

## 2023-02-11 VITALS — BP 130/70 | HR 65 | Temp 98.3°F | Resp 18 | Ht 70.0 in | Wt 218.2 lb

## 2023-02-11 DIAGNOSIS — E538 Deficiency of other specified B group vitamins: Secondary | ICD-10-CM | POA: Insufficient documentation

## 2023-02-11 DIAGNOSIS — R0609 Other forms of dyspnea: Secondary | ICD-10-CM

## 2023-02-11 DIAGNOSIS — R600 Localized edema: Secondary | ICD-10-CM | POA: Diagnosis not present

## 2023-02-11 DIAGNOSIS — Z1159 Encounter for screening for other viral diseases: Secondary | ICD-10-CM | POA: Diagnosis not present

## 2023-02-11 DIAGNOSIS — N1831 Chronic kidney disease, stage 3a: Secondary | ICD-10-CM

## 2023-02-11 DIAGNOSIS — R079 Chest pain, unspecified: Secondary | ICD-10-CM

## 2023-02-11 DIAGNOSIS — R350 Frequency of micturition: Secondary | ICD-10-CM | POA: Diagnosis not present

## 2023-02-11 DIAGNOSIS — R0600 Dyspnea, unspecified: Secondary | ICD-10-CM | POA: Diagnosis not present

## 2023-02-11 MED ORDER — FUROSEMIDE 20 MG PO TABS: 20.0000 mg | ORAL_TABLET | Freq: Every day | ORAL | 3 refills | Status: DC

## 2023-02-11 NOTE — Progress Notes (Signed)
Established Patient Office Visit  Subjective   Patient ID: Anthony King, male    DOB: Aug 02, 1950  Age: 72 y.o. MRN: [id number]  Chief Complaint  Patient presents with   Follow-up    HPI Discussed the use of AI scribe software for clinical note transcription with the patient, who gave verbal consent to proceed.  History of Present Illness   The patient, a 72 year old with a history of COVID-19 infection four years ago, presents with ongoing shortness of breath and fatigue. He reports that he is 'all right until I do a little something,' at which point he becomes breathless. He also reports a lack of strength and has been experiencing sharp pains in his lower back. He has been using breathing aids, but reports that they are not helping. He has also been experiencing memory loss.  The patient also reports swelling in his feet every night, which has been ongoing for a couple of years. He uses compression socks, which he reports help more than anything else. He also reports having to get up about three times a night to use the bathroom.  The patient has been seeing a cardiologist and a pulmonary doctor, but has not had a follow-up appointment in over a year. He has also been experiencing chest pains, which come and go.      Patient Active Problem List   Diagnosis Date Noted   B12 deficiency 02/11/2023   Need for hepatitis C screening test 02/11/2023   Lower extremity edema 02/11/2023   Pain in joint of left knee 02/25/2022   Pain in right hand 09/26/2021   Muscle cramps 02/06/2021   Bronchitis 02/06/2021   Frequent urination 02/06/2021   Chronic bronchitis with productive mucopurulent cough (HCC) 01/16/2021   Post-COVID chronic decreased mobility and endurance 09/10/2020   Post-COVID chronic dyspnea 08/12/2020   Other fatigue 08/12/2020   Depression, major, single episode, moderate (HCC) 08/12/2020   DOE (dyspnea on exertion) 12/07/2019   Pneumonia due to COVID-19 virus  10/18/2019   Hypoxia 10/18/2019   Left lower quadrant abdominal pain 05/31/2018   CRF (chronic renal failure), stage 3 (moderate) (HCC) 04/18/2018   CAD (coronary artery disease), native coronary artery 03/19/2018   Aortic atherosclerosis (HCC) 03/19/2018   Prediabetes 03/19/2018   Unstable angina (HCC) 03/19/2018   Chest pain 03/18/2018   CKD (chronic kidney disease), stage III 03/18/2018   Hyperlipidemia 10/04/2017   Left flank mass, 3 cm subfascial 08/21/2013   Mass of chest wall, right, 1 cm, subcutaneous. 08/21/2013   Esophageal reflux 10/28/2011   Personal history of colonic polyps 10/28/2011   Past Medical History:  Diagnosis Date   Arthritis 07/17/2011   hands, hips, knees   Basal cell carcinoma    nose   Colon polyps    ADENOMATOUS AND HYPERPLASTIC POLYPS   Coronary artery disease    COVID-19    Diverticulosis    DOE (dyspnea on exertion) 12/07/2019   Onset with covid 19 pna  - see adimit 10/18/19  -  12/07/2019   Walked RA  3 laps @ approx 217ft each @ slow slt unsteady  pace  stopped due to sob / weakness but sats never less than 92%    Dyspnea    with exertion   Gastric ulcer    GERD (gastroesophageal reflux disease)    Headache    Hemorrhoids    History of kidney stones    Hypoxia 10/18/2019   Left lower quadrant abdominal pain 05/31/2018   Pneumonia  Pre-diabetes    Wears glasses    Past Surgical History:  Procedure Laterality Date   APPENDECTOMY     BASAL CELL CARCINOMA EXCISION     nose   CARDIAC CATHETERIZATION     COLONOSCOPY     EXCISION OF MESH Right 02/15/2020   Procedure: EXPLANTATION OF MESH;  Surgeon: Harriette Bouillon, MD;  Location: [facility];  Service: General;  Laterality: Right;   fatty tumors  07/17/2011   excised x4- 1 -neck, 1 arm, 2 chest   GROIN DISSECTION Right 02/15/2020   Procedure: RIGHT GROIN NEURECTOMY;  Surgeon: Harriette Bouillon, MD;  Location: [facility];  Service: General;  Laterality: Right;  DR Leeanne Mannan APPROVED CORNETT USING HIS  TIME PER ABBIE   HEMORRHOID BANDING     HEMORRHOID SURGERY N/A 05/15/2022   Procedure: RECTAL EXAM UNDER ANESTHESIA WITH HEMORRHOIDECTOMY;  Surgeon: Luretha Murphy, MD;  Location: [facility];  Service: General;  Laterality: N/A;   INGUINAL HERNIA REPAIR  07/23/2011   Procedure: LAPAROSCOPIC INGUINAL HERNIA;  Surgeon: Almond Lint, MD;  Location: [facility];  Service: General;  Laterality: Left;   INGUINAL HERNIA REPAIR Bilateral 04/19/2019   Procedure: BILATERAL OPEN INGUINAL HERNIA REPAIR WITH MESH;  Surgeon: Harriette Bouillon, MD;  Location: [facility];  Service: General;  Laterality: Bilateral;   INGUINAL HERNIA REPAIR Right 02/15/2020   Procedure: RIGHT HERNIA REPAIR INGUINAL WITH MESH;  Surgeon: Harriette Bouillon, MD;  Location: [facility];  Service: General;  Laterality: Right;   KNEE ARTHROSCOPY Left    LAPAROSCOPY N/A 02/15/2020   Procedure: LAPAROSCOPY DIAGNOSTIC;  Surgeon: Harriette Bouillon, MD;  Location: [facility];  Service: General;  Laterality: N/A;   LEFT HEART CATH AND CORONARY ANGIOGRAPHY N/A 03/21/2018   Procedure: LEFT HEART CATH AND CORONARY ANGIOGRAPHY;  Surgeon: Corky Crafts, MD;  Location: [facility];  Service: Cardiovascular;  Laterality: N/A;   MASS EXCISION N/A 08/29/2013   Procedure: EXCISION LEFT FLANK MASS/RIGHT EXCISION CHEST WALL MASS;  Surgeon: Almond Lint, MD;  Location: [facility];  Service: General;  Laterality: N/A;  Left flank   POLYPECTOMY     TONSILLECTOMY     Social History   Tobacco Use   Smoking status: Former    Current packs/day: 0.00    Types: Cigarettes    Start date: 1963    Quit date: 07/02/1991    Years since quitting: 31.6   Smokeless tobacco: Never  Vaping Use   Vaping status: Never Used  Substance Use Topics   Alcohol use: No   Drug use: No   Social History   Socioeconomic History   Marital status: Married    Spouse name: Not on file   Number of children: 1   Years of education: Not on file   Highest  education level: Not on file  Occupational History   Occupation: self-employed , covers furniture  Tobacco Use   Smoking status: Former    Current packs/day: 0.00    Types: Cigarettes    Start date: 1963    Quit date: 07/02/1991    Years since quitting: 31.6   Smokeless tobacco: Never  Vaping Use   Vaping status: Never Used  Substance and Sexual Activity   Alcohol use: No   Drug use: No   Sexual activity: Yes  Other Topics Concern   Not on file  Social History Narrative   Exercise- no   Right handed   No caffeine   One story home   Social Determinants  of Health   Financial Resource Strain: Not on file  Food Insecurity: No Food Insecurity (08/30/2020)   Hunger Vital Sign    Worried About Running Out of Food in the Last Year: Never true    Ran Out of Food in the Last Year: Never true  Transportation Needs: No Transportation Needs (08/30/2020)   PRAPARE - Administrator, Civil Service (Medical): No    Lack of Transportation (Non-Medical): No  Physical Activity: Not on file  Stress: Not on file  Social Connections: Not on file  Intimate Partner Violence: Not on file   Family Status  Relation Name Status   Mother  Deceased at age 54       covid pneumonia   Father  Deceased   Sister  Deceased   Brother  (Not Specified)   MGM  (Not Specified)   PGM  (Not Specified)   Neg Hx  (Not Specified)  No partnership data on file   Family History  Problem Relation Age of Onset   Arthritis Mother    Breast cancer Mother    Pneumonia Mother    Alzheimer's disease Mother    Stomach cancer Father    Colon cancer Father    Stomach cancer Sister        Passed away 12/12   Colon cancer Brother    Arthritis Maternal Grandmother    Arthritis Paternal Grandmother    Esophageal cancer Neg Hx    Rectal cancer Neg Hx    Colon polyps Neg Hx    Allergies  Allergen Reactions   Aspirin Other (See Comments)     nauseated, drooling      Review of Systems  Constitutional:   Negative for chills, fever and malaise/fatigue.  HENT:  Negative for congestion and hearing loss.   Eyes:  Negative for blurred vision and discharge.  Respiratory:  Negative for cough, sputum production and shortness of breath.   Cardiovascular:  Negative for chest pain, palpitations and leg swelling.  Gastrointestinal:  Negative for abdominal pain, blood in stool, constipation, diarrhea, heartburn, nausea and vomiting.  Genitourinary:  Negative for dysuria, frequency, hematuria and urgency.  Musculoskeletal:  Negative for back pain, falls and myalgias.  Skin:  Negative for rash.  Neurological:  Negative for dizziness, sensory change, loss of consciousness, weakness and headaches.  Endo/Heme/Allergies:  Negative for environmental allergies. Does not bruise/bleed easily.  Psychiatric/Behavioral:  Negative for depression and suicidal ideas. The patient is not nervous/anxious and does not have insomnia.       Objective:     BP 130/70 (BP Location: Right Arm, Patient Position: Sitting, Cuff Size: Normal)   Pulse 65   Temp 98.3 F (36.8 C) (Oral)   Resp 18   Ht 5\' 10"  (1.778 m)   Wt 218 lb 3.2 oz (99 kg)   SpO2 98%   BMI 31.31 kg/m  BP Readings from Last 3 Encounters:  02/11/23 130/70  11/05/22 114/70  05/15/22 (!) 145/76   Wt Readings from Last 3 Encounters:  02/11/23 218 lb 3.2 oz (99 kg)  11/05/22 220 lb (99.8 kg)  10/15/22 220 lb (99.8 kg)   SpO2 Readings from Last 3 Encounters:  02/11/23 98%  11/05/22 93%  05/15/22 99%      Physical Exam Vitals and nursing note reviewed.  Constitutional:      General: He is not in acute distress.    Appearance: Normal appearance. He is well-developed.  HENT:     Head: Normocephalic and  atraumatic.  Eyes:     General: No scleral icterus.       Right eye: No discharge.        Left eye: No discharge.  Cardiovascular:     Rate and Rhythm: Normal rate and regular rhythm.     Heart sounds: No murmur heard. Pulmonary:     Effort:  Pulmonary effort is normal. No respiratory distress.     Breath sounds: Normal breath sounds.  Musculoskeletal:        General: Normal range of motion.     Cervical back: Normal range of motion and neck supple.     Right lower leg: No edema.     Left lower leg: No edema.  Skin:    General: Skin is warm and dry.  Neurological:     General: No focal deficit present.     Mental Status: He is alert and oriented to person, place, and time.  Psychiatric:        Mood and Affect: Mood normal.        Behavior: Behavior normal.        Thought Content: Thought content normal.        Judgment: Judgment normal.      No results found for any visits on 02/11/23.  Last CBC Lab Results  Component Value Date   WBC 10.0 01/01/2022   HGB 14.9 01/01/2022   HCT 46.2 01/01/2022   MCV 83.5 01/01/2022   MCH 26.9 01/01/2022   RDW 15.2 01/01/2022   PLT 194 01/01/2022   Last metabolic panel Lab Results  Component Value Date   GLUCOSE 102 (H) 05/07/2022   NA 142 05/07/2022   K 4.2 05/07/2022   CL 111 05/07/2022   CO2 25 05/07/2022   BUN 16 05/07/2022   CREATININE 1.27 (H) 05/07/2022   GFRNONAA >60 05/07/2022   CALCIUM 9.1 05/07/2022   PROT 6.3 02/06/2021   ALBUMIN 4.1 02/06/2021   BILITOT 1.0 02/06/2021   ALKPHOS 99 02/06/2021   AST 20 02/06/2021   ALT 20 02/06/2021   ANIONGAP 6 05/07/2022   Last lipids Lab Results  Component Value Date   CHOL 147 03/18/2020   HDL 39 (L) 03/18/2020   LDLCALC 91 03/18/2020   LDLDIRECT 141.0 06/11/2016   TRIG 88 03/18/2020   CHOLHDL 3.8 03/18/2020   Last hemoglobin A1c Lab Results  Component Value Date   HGBA1C 5.4 05/07/2022   Last thyroid functions Lab Results  Component Value Date   TSH 2.81 09/10/2020   Last vitamin D No results found for: "25OHVITD2", "25OHVITD3", "VD25OH" Last vitamin B12 and Folate Lab Results  Component Value Date   VITAMINB12 951 (H) 09/10/2020      The 10-year ASCVD risk score (Arnett DK, et al., 2019)  is: 20.4%    Assessment & Plan:   Problem List Items Addressed This Visit       Unprioritized   Chest pain   Relevant Orders   EKG 12-Lead (Completed)   DG Chest 2 View   CKD (chronic kidney disease), stage III - Primary   Relevant Orders   CBC with Differential/Platelet   Comprehensive metabolic panel   Lipid panel   DOE (dyspnea on exertion)   Relevant Orders   ECHOCARDIOGRAM COMPLETE   Ambulatory referral to Cardiology   EKG 12-Lead (Completed)   DG Chest 2 View   Frequent urination   Relevant Orders   PSA   Need for hepatitis C screening test   Relevant Orders  Hepatitis C antibody   Lower extremity edema   Relevant Medications   furosemide (LASIX) 20 MG tablet   Other Relevant Orders   ECHOCARDIOGRAM COMPLETE   Ambulatory referral to Cardiology   EKG 12-Lead (Completed)   DG Chest 2 View   B12 deficiency   Relevant Orders   Vitamin B12  Assessment and Plan    Post-Acute Sequelae of SARS-CoV-2 infection (PASC) Persistent shortness of breath and fatigue 4 years post-COVID-19 infection. No improvement with current treatments. -Refer to [facility] in [place] for vitamin infusions. -Consider cardiology referral for further evaluation.  Possible Congestive Heart Failure Reports of bilateral lower extremity edema and nocturia. -Order EKG today. -Order chest x-ray to assess for fluid around the heart. -Start diuretic medication to reduce fluid retention. -Continue use of compression socks as needed. -Refer to cardiologist for further evaluation.  Cognitive Concerns Reports of memory loss. -Monitor and reassess at next visit.  General Health Maintenance -Order routine labs and urine specimen today.        Return in about 1 year (around 02/11/2024), or if symptoms worsen or fail to improve, for annual exam, fasting.    Donato Schultz, DO

## 2023-02-11 NOTE — Patient Instructions (Signed)
Preventive Care 65 Years and Older, Male Preventive care refers to lifestyle choices and visits with your health care provider that can promote health and wellness. Preventive care visits are also called wellness exams. What can I expect for my preventive care visit? Counseling During your preventive care visit, your health care provider may ask about your: Medical history, including: Past medical problems. Family medical history. History of falls. Current health, including: Emotional well-being. Home life and relationship well-being. Sexual activity. Memory and ability to understand (cognition). Lifestyle, including: Alcohol, nicotine or tobacco, and drug use. Access to firearms. Diet, exercise, and sleep habits. Work and work environment. Sunscreen use. Safety issues such as seatbelt and bike helmet use. Physical exam Your health care provider will check your: Height and weight. These may be used to calculate your BMI (body mass index). BMI is a measurement that tells if you are at a healthy weight. Waist circumference. This measures the distance around your waistline. This measurement also tells if you are at a healthy weight and may help predict your risk of certain diseases, such as type 2 diabetes and high blood pressure. Heart rate and blood pressure. Body temperature. Skin for abnormal spots. What immunizations do I need?  Vaccines are usually given at various ages, according to a schedule. Your health care provider will recommend vaccines for you based on your age, medical history, and lifestyle or other factors, such as travel or where you work. What tests do I need? Screening Your health care provider may recommend screening tests for certain conditions. This may include: Lipid and cholesterol levels. Diabetes screening. This is done by checking your blood sugar (glucose) after you have not eaten for a while (fasting). Hepatitis C test. Hepatitis B test. HIV (human  immunodeficiency virus) test. STI (sexually transmitted infection) testing, if you are at risk. Lung cancer screening. Colorectal cancer screening. Prostate cancer screening. Abdominal aortic aneurysm (AAA) screening. You may need this if you are a current or former smoker. Talk with your health care provider about your test results, treatment options, and if necessary, the need for more tests. Follow these instructions at home: Eating and drinking  Eat a diet that includes fresh fruits and vegetables, whole grains, lean protein, and low-fat dairy products. Limit your intake of foods with high amounts of sugar, saturated fats, and salt. Take vitamin and mineral supplements as recommended by your health care provider. Do not drink alcohol if your health care provider tells you not to drink. If you drink alcohol: Limit how much you have to 0-2 drinks a day. Know how much alcohol is in your drink. In the [place] one drink equals one 12 oz bottle of beer (355 mL), one 5 oz glass of wine (148 mL), or one 1 oz glass of hard liquor (44 mL). Lifestyle Brush your teeth every morning and night with fluoride toothpaste. Floss one time each day. Exercise for at least 30 minutes 5 or more days each week. Do not use any products that contain nicotine or tobacco. These products include cigarettes, chewing tobacco, and vaping devices, such as e-cigarettes. If you need help quitting, ask your health care provider. Do not use drugs. If you are sexually active, practice safe sex. Use a condom or other form of protection to prevent STIs. Take aspirin only as told by your health care provider. Make sure that you understand how much to take and what form to take. Work with your health care provider to find out whether it is safe   and beneficial for you to take aspirin daily. Ask your health care provider if you need to take a cholesterol-lowering medicine (statin). Find healthy ways to manage stress, such  as: Meditation, yoga, or listening to music. Journaling. Talking to a trusted person. Spending time with friends and family. Safety Always wear your seat belt while driving or riding in a vehicle. Do not drive: If you have been drinking alcohol. Do not ride with someone who has been drinking. When you are tired or distracted. While texting. If you have been using any mind-altering substances or drugs. Wear a helmet and other protective equipment during sports activities. If you have firearms in your house, make sure you follow all gun safety procedures. Minimize exposure to UV radiation to reduce your risk of skin cancer. What's next? Visit your health care provider once a year for an annual wellness visit. Ask your health care provider how often you should have your eyes and teeth checked. Stay up to date on all vaccines. This information is not intended to replace advice given to you by your health care provider. Make sure you discuss any questions you have with your health care provider. Document Revised: 12/11/2020 Document Reviewed: 12/11/2020 Elsevier Patient Education  2024 [employer]  

## 2023-02-12 ENCOUNTER — Other Ambulatory Visit: Payer: Self-pay | Admitting: Family Medicine

## 2023-02-12 ENCOUNTER — Other Ambulatory Visit: Payer: Self-pay

## 2023-02-12 DIAGNOSIS — E785 Hyperlipidemia, unspecified: Secondary | ICD-10-CM

## 2023-02-12 LAB — COMPREHENSIVE METABOLIC PANEL
ALT: 22 U/L (ref 0–53)
AST: 22 U/L (ref 0–37)
Albumin: 4.4 g/dL (ref 3.5–5.2)
Alkaline Phosphatase: 84 U/L (ref 39–117)
BUN: 15 mg/dL (ref 6–23)
CO2: 27 meq/L (ref 19–32)
Calcium: 9.6 mg/dL (ref 8.4–10.5)
Chloride: 97 meq/L (ref 96–112)
Creatinine, Ser: 1.33 mg/dL (ref 0.40–1.50)
GFR: 53.5 mL/min — ABNORMAL LOW (ref >60.00)
Glucose, Bld: 86 mg/dL (ref 70–99)
Potassium: 4.1 meq/L (ref 3.5–5.1)
Sodium: 129 meq/L — ABNORMAL LOW (ref 135–145)
Total Bilirubin: 2.1 mg/dL — ABNORMAL HIGH (ref 0.2–1.2)
Total Protein: 6.6 g/dL (ref 6.0–8.3)

## 2023-02-12 LAB — CBC WITH DIFFERENTIAL/PLATELET
Basophils Absolute: 0.1 10*3/uL (ref 0.0–0.1)
Basophils Relative: 1 % (ref 0.0–3.0)
Eosinophils Absolute: 0.1 10*3/uL (ref 0.0–0.7)
Eosinophils Relative: 1.3 % (ref 0.0–5.0)
HCT: 49.5 % (ref 39.0–52.0)
Hemoglobin: 15.8 g/dL (ref 13.0–17.0)
Lymphocytes Relative: 30.8 % (ref 12.0–46.0)
Lymphs Abs: 2.3 10*3/uL (ref 0.7–4.0)
MCHC: 32 g/dL (ref 30.0–36.0)
MCV: 84.5 fl (ref 78.0–100.0)
Monocytes Absolute: 0.7 10*3/uL (ref 0.1–1.0)
Monocytes Relative: 9.5 % (ref 3.0–12.0)
Neutro Abs: 4.4 10*3/uL (ref 1.4–7.7)
Neutrophils Relative %: 57.4 % (ref 43.0–77.0)
Platelets: 206 10*3/uL (ref 150.0–400.0)
RBC: 5.86 Mil/uL — ABNORMAL HIGH (ref 4.22–5.81)
RDW: 15.5 % (ref 11.5–15.5)
WBC: 7.6 10*3/uL (ref 4.0–10.5)

## 2023-02-12 LAB — HEPATITIS C ANTIBODY: Hepatitis C Ab: NONREACTIVE

## 2023-02-12 LAB — LIPID PANEL
Cholesterol: 240 mg/dL — ABNORMAL HIGH (ref 0–200)
HDL: 32.6 mg/dL — ABNORMAL LOW (ref >39.00)
LDL Cholesterol: 171 mg/dL — ABNORMAL HIGH (ref 0–99)
NonHDL: 207.34
Total CHOL/HDL Ratio: 7
Triglycerides: 181 mg/dL — ABNORMAL HIGH (ref 0.0–149.0)
VLDL: 36.2 mg/dL (ref 0.0–40.0)

## 2023-02-12 LAB — VITAMIN B12: Vitamin B-12: 873 pg/mL (ref 211–911)

## 2023-02-12 LAB — PSA: PSA: 0.77 ng/mL (ref 0.10–4.00)

## 2023-02-12 MED ORDER — ROSUVASTATIN CALCIUM 20 MG PO TABS: 20.0000 mg | ORAL_TABLET | Freq: Every day | ORAL | 2 refills | Status: DC

## 2023-03-03 ENCOUNTER — Ambulatory Visit (HOSPITAL_COMMUNITY): Payer: Medicare Other | Attending: Internal Medicine

## 2023-03-03 DIAGNOSIS — R600 Localized edema: Secondary | ICD-10-CM | POA: Insufficient documentation

## 2023-03-03 DIAGNOSIS — R0609 Other forms of dyspnea: Secondary | ICD-10-CM | POA: Insufficient documentation

## 2023-03-03 LAB — ECHOCARDIOGRAM COMPLETE
Area-P 1/2: 3.08 cm2
P 1/2 time: 466 ms
S' Lateral: 3.5 cm

## 2023-04-19 ENCOUNTER — Encounter: Payer: Self-pay | Admitting: Cardiovascular Disease

## 2023-04-19 ENCOUNTER — Ambulatory Visit: Payer: Medicare Other | Attending: Cardiovascular Disease | Admitting: Cardiovascular Disease

## 2023-04-19 DIAGNOSIS — R600 Localized edema: Secondary | ICD-10-CM | POA: Diagnosis not present

## 2023-04-19 DIAGNOSIS — R946 Abnormal results of thyroid function studies: Secondary | ICD-10-CM | POA: Diagnosis not present

## 2023-04-19 DIAGNOSIS — R42 Dizziness and giddiness: Secondary | ICD-10-CM

## 2023-04-19 DIAGNOSIS — I200 Unstable angina: Secondary | ICD-10-CM | POA: Insufficient documentation

## 2023-04-19 DIAGNOSIS — Z1329 Encounter for screening for other suspected endocrine disorder: Secondary | ICD-10-CM | POA: Diagnosis not present

## 2023-04-19 DIAGNOSIS — R0609 Other forms of dyspnea: Secondary | ICD-10-CM | POA: Diagnosis not present

## 2023-04-19 DIAGNOSIS — I25118 Atherosclerotic heart disease of native coronary artery with other forms of angina pectoris: Secondary | ICD-10-CM

## 2023-04-19 DIAGNOSIS — U099 Post covid-19 condition, unspecified: Secondary | ICD-10-CM | POA: Diagnosis not present

## 2023-04-19 DIAGNOSIS — G4733 Obstructive sleep apnea (adult) (pediatric): Secondary | ICD-10-CM | POA: Diagnosis not present

## 2023-04-19 DIAGNOSIS — E785 Hyperlipidemia, unspecified: Secondary | ICD-10-CM | POA: Diagnosis not present

## 2023-04-19 DIAGNOSIS — G629 Polyneuropathy, unspecified: Secondary | ICD-10-CM | POA: Diagnosis not present

## 2023-04-19 DIAGNOSIS — E782 Mixed hyperlipidemia: Secondary | ICD-10-CM

## 2023-04-19 MED ORDER — METOPROLOL TARTRATE 50 MG PO TABS
50.0000 mg | ORAL_TABLET | Freq: Once | ORAL | 0 refills | Status: DC
Start: 2023-04-19 — End: 2023-08-23

## 2023-04-19 NOTE — Progress Notes (Signed)
Cardiology Office Note    Date:  04/24/2023   ID:  Anthony King, DOB April 27, 1951, MRN [id number]  PCP:  Zola Button, Grayling Congress, DO  Cardiologist:  Nicki Guadalajara, MD   New cardiology evaluation  Chief Complaint  Patient presents with   Dizziness    While singing at church yesterday, felt lightheaded/dizzy almost passed out    History of Present Illness:  Anthony King is a 72 y.o. male who is followed by Seabron Spates for primary care.  He has a history of prior chest pain which led to definitive cardiac catheterization performed by Dr. Eldridge Dace on March 21, 2018.  He was found to have mild nonobstructive CAD with 40% narrowing in the second diagonal branch of his LAD.  LVEDP was normal at 9 mm.  There was no evidence for aortic stenosis.  He has been followed by Dr. Melody Comas of pulmonary and has obstructive sleep apnea for which he had been using CPAP but ultimately stopped using therapy.  He does have issues with bilateral lower extremity edema and has had issues with neuropathy.  In addition, he required 3 inguinal hernia surgeries.  He has had issues with low back pain and sciatica and uses a TENS unit.  He had significant episode of COVID in 2020 at which time he was hospitalized for greater than a week required supplemental oxygen and has noticed some shortness of breath ever since.  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  He last saw his primary physician in August 2021 and with some mild shortness of breath he underwent a 2D echo Doppler study on March 03, 2023.  This showed normal LV function with EF 55 to 60% with grade 1 diastolic dysfunction.  RV pressure were normal.  His aortic valve was trileaflet although there was evidence for moderate aortic regurgitation.  There was very mild aortic dilatation of the aortic root at 40 mm and borderline date dilation of ascending aorta at 39 mm.  He had trivial MR.  Apparently, he recently has  experienced some episodes of dizziness and experienced an episode at church while singing.  He was added onto my schedule for cardiology evaluation.   Past Medical History:  Diagnosis Date   Arthritis 07/17/2011   hands, hips, knees   Basal cell carcinoma    nose   Colon polyps    ADENOMATOUS AND HYPERPLASTIC POLYPS   Coronary artery disease    COVID-19    Diverticulosis    DOE (dyspnea on exertion) 12/07/2019   Onset with covid 19 pna  - see adimit 10/18/19  -  12/07/2019   Walked RA  3 laps @ approx 232ft each @ slow slt unsteady  pace  stopped due to sob / weakness but sats never less than 92%    Dyspnea    with exertion   Gastric ulcer    GERD (gastroesophageal reflux disease)    Headache    Hemorrhoids    History of kidney stones    Hypoxia 10/18/2019   Left lower quadrant abdominal pain 05/31/2018   Pneumonia    Pre-diabetes    Wears glasses     Past Surgical History:  Procedure Laterality Date   APPENDECTOMY     BASAL CELL CARCINOMA EXCISION     nose   CARDIAC CATHETERIZATION     COLONOSCOPY     EXCISION OF MESH Right 02/15/2020   Procedure: EXPLANTATION OF MESH;  Surgeon:  Harriette Bouillon, MD;  Location: [facility];  Service: General;  Laterality: Right;   fatty tumors  07/17/2011   excised x4- 1 -neck, 1 arm, 2 chest   GROIN DISSECTION Right 02/15/2020   Procedure: RIGHT GROIN NEURECTOMY;  Surgeon: Harriette Bouillon, MD;  Location: [facility];  Service: General;  Laterality: Right;  DR FAROOQUI APPROVED CORNETT USING HIS TIME PER ABBIE   HEMORRHOID BANDING     HEMORRHOID SURGERY N/A 05/15/2022   Procedure: RECTAL EXAM UNDER ANESTHESIA WITH HEMORRHOIDECTOMY;  Surgeon: Luretha Murphy, MD;  Location: [facility];  Service: General;  Laterality: N/A;   INGUINAL HERNIA REPAIR  07/23/2011   Procedure: LAPAROSCOPIC INGUINAL HERNIA;  Surgeon: Almond Lint, MD;  Location: [facility];  Service: General;  Laterality: Left;   INGUINAL HERNIA REPAIR Bilateral 04/19/2019   Procedure: BILATERAL  OPEN INGUINAL HERNIA REPAIR WITH MESH;  Surgeon: Harriette Bouillon, MD;  Location: [facility];  Service: General;  Laterality: Bilateral;   INGUINAL HERNIA REPAIR Right 02/15/2020   Procedure: RIGHT HERNIA REPAIR INGUINAL WITH MESH;  Surgeon: Harriette Bouillon, MD;  Location: [facility];  Service: General;  Laterality: Right;   KNEE ARTHROSCOPY Left    LAPAROSCOPY N/A 02/15/2020   Procedure: LAPAROSCOPY DIAGNOSTIC;  Surgeon: Harriette Bouillon, MD;  Location: [facility];  Service: General;  Laterality: N/A;   LEFT HEART CATH AND CORONARY ANGIOGRAPHY N/A 03/21/2018   Procedure: LEFT HEART CATH AND CORONARY ANGIOGRAPHY;  Surgeon: Corky Crafts, MD;  [facility]: [facility];  Service: Cardiovascular;  Laterality: N/A;   MASS EXCISION N/A 08/29/2013   Procedure: EXCISION LEFT FLANK MASS/RIGHT EXCISION CHEST WALL MASS;  Surgeon: Almond Lint, MD;  Location: [facility];  Service: General;  Laterality: N/A;  Left flank   POLYPECTOMY     TONSILLECTOMY      Current Medications: Outpatient Medications Prior to Visit  Medication Sig Dispense Refill   furosemide (LASIX) 20 MG tablet Take 1 tablet (20 mg total) by mouth daily. 30 tablet 3   lansoprazole (PREVACID) 15 MG capsule Take 15 mg by mouth daily at 12 noon.     rosuvastatin (CRESTOR) 20 MG tablet Take 1 tablet (20 mg total) by mouth daily. 30 tablet 2   vitamin B-12 (CYANOCOBALAMIN) 1000 MCG tablet Take 1,000 mcg by mouth daily.      No facility-administered medications prior to visit.     Allergies:   Aspirin   Social History   Socioeconomic History   Marital status: Married    Spouse name: Not on file   Number of children: 1   Years of education: Not on file   Highest education level: Not on file  Occupational History   Occupation: self-employed , covers furniture  Tobacco Use   Smoking status: Former    Current packs/day: 0.00    Types: Cigarettes    Start date: 1963    Quit date: 07/02/1991    Years  since quitting: 31.8   Smokeless tobacco: Never  Vaping Use   Vaping status: Never Used  Substance and Sexual Activity   Alcohol use: No   Drug use: No   Sexual activity: Yes  Other Topics Concern   Not on file  Social History Narrative   Exercise- no   Right handed   No caffeine   One story home   Social Determinants of Health   Financial Resource Strain: Not on file  Food Insecurity: No Food Insecurity (08/30/2020)   Hunger Vital Sign  Worried About Programme researcher, broadcasting/film/video in the Last Year: Never true    Ran Out of Food in the Last Year: Never true  Transportation Needs: No Transportation Needs (08/30/2020)   PRAPARE - Administrator, Civil Service (Medical): No    Lack of Transportation (Non-Medical): No  Physical Activity: Not on file  Stress: Not on file  Social Connections: Not on file    Social history is notable that he was born in [facility] [place] [place].  He subsequently moved to [place] and lives in the [address] area.  He is married for 51 years.  He has 1 child and 3 grandchildren.  He had worked in Public affairs consultant.  There is remote tobacco history but fortunately he quit in 1992.  There is no illicit drug use.  He had previously walked  3 miles per day for 13 years.  He stays active repulsed during furniture.  Family History:  The patient's family history includes Alzheimer's disease in his mother; Arthritis in his maternal grandmother, mother, and paternal grandmother; Breast cancer in his mother; Colon cancer in his brother and father; Pneumonia in his mother; Stomach cancer in his father and sister.   Mother died at age 73 with old age but developed COVID.  Father died at age 65 occult stomach cancer.  He has a brother who is 17 with urologic cancer.  His sister died at age 35 with stomach cancer.  This child is 14 years old.  ROS General: Negative; No fevers, chills, or night sweats;  HEENT: Negative; No changes in vision or hearing,  sinus congestion, difficulty swallowing Pulmonary: Negative; No cough, wheezing, shortness of breath, hemoptysis Cardiovascular: See HPI GI: Colonic polyps GU: Negative; No dysuria, hematuria, or difficulty voiding Musculoskeletal: Low back pain, sciatica Hematologic/Oncology: Negative; no easy bruising, bleeding Endocrine: Negative; no heat/cold intolerance; no diabetes Neuro: Negative; no changes in balance, headaches Skin: Negative; No rashes or skin lesions Psychiatric: Negative; No behavioral problems, depression Sleep: Previously diagnosed with OSA, had been on CPAP followed by Dr. Melody Comas, currently not on therapy Other comprehensive 14 point system review is negative.   PHYSICAL EXAM:   VS:  BP 120/70 (BP Location: Left Arm, Patient Position: Sitting, Cuff Size: Normal)   Pulse 62   Ht 5\' 10"  (1.778 m)   Wt 221 lb 9.6 oz (100.5 kg)   SpO2 99%   BMI 31.80 kg/m     Repeat blood pressure by me was 122/70  Wt Readings from Last 3 Encounters:  04/19/23 221 lb 9.6 oz (100.5 kg)  02/11/23 218 lb 3.2 oz (99 kg)  11/05/22 220 lb (99.8 kg)    General: Alert, oriented, no distress.  Skin: normal turgor, no rashes, warm and dry HEENT: Normocephalic, atraumatic. Pupils equal round and reactive to light; sclera anicteric; extraocular muscles intact;  Nose without nasal septal hypertrophy Mouth/Parynx benign; Mallinpatti scale 3 Neck: No JVD, no carotid bruits; normal carotid upstroke Lungs: clear to ausculatation and percussion; no wheezing or rales Chest wall: without tenderness to palpitation Heart: PMI not displaced, RRR, s1 s2 normal, 1/6 systolic murmur, 1/6 diastolic murmur, no rubs, gallops, thrills, or heaves Abdomen: soft, nontender; no hepatosplenomehaly, BS+; abdominal aorta nontender and not dilated by palpation. Back: no CVA tenderness Pulses 2+ Musculoskeletal: full range of motion, normal strength, no joint deformities Extremities: Bilateral lower  extremity mild edema, no clubbing cyanosis, Homan's sign negative  Neurologic: grossly nonfocal; Cranial nerves grossly wnl Psychologic: Normal mood  and affect   Studies/Labs Reviewed:   EKG Interpretation Date/Time:  Monday April 19 2023 08:55:16 EDT Ventricular Rate:  62 PR Interval:  158 QRS Duration:  96 QT Interval:  450 QTC Calculation: 456 R Axis:   74  Text Interpretation: Normal sinus rhythm Nonspecific ST and T wave abnormality When compared with ECG of 07-May-2022 08:37, Questionable change in QRS axis Confirmed by Nicki Guadalajara ([id number]) on 04/19/2023 9:13:54 AM    Recent Labs:    Latest Ref Rng & Units 04/19/2023   10:08 AM 02/11/2023    2:33 PM 05/07/2022    8:32 AM  BMP  Glucose 70 - 99 mg/dL 92  86  604   BUN 8 - 27 mg/dL 18  15  16    Creatinine 0.76 - 1.27 mg/dL 5.40  9.81  1.91   BUN/Creat Ratio 10 - 24 14     Sodium 134 - 144 mmol/L 143  129  142   Potassium 3.5 - 5.2 mmol/L 4.7  4.1  4.2   Chloride 96 - 106 mmol/L 104  97  111   CO2 20 - 29 mmol/L 25  27  25    Calcium 8.6 - 10.2 mg/dL 9.3  9.6  9.1         Latest Ref Rng & Units 04/19/2023   10:08 AM 02/11/2023    2:33 PM 02/06/2021   10:30 AM  Hepatic Function  Total Protein 6.0 - 8.5 g/dL 6.2  6.6  6.3   Albumin 3.8 - 4.8 g/dL 4.2  4.4  4.1   AST 0 - 40 IU/L 20  22  20    ALT 0 - 44 IU/L 20  22  20    Alk Phosphatase 44 - 121 IU/L 87  84  99   Total Bilirubin 0.0 - 1.2 mg/dL 1.3  2.1  1.0        Latest Ref Rng & Units 02/11/2023    2:33 PM 01/01/2022    7:49 PM 02/06/2021   10:30 AM  CBC  WBC 4.0 - 10.5 K/uL 7.6  10.0  5.2   Hemoglobin 13.0 - 17.0 g/dL 47.8  29.5  62.1   Hematocrit 39.0 - 52.0 % 49.5  46.2  44.6   Platelets 150.0 - 400.0 K/uL 206.0  194  177.0    Lab Results  Component Value Date   MCV 84.5 02/11/2023   MCV 83.5 01/01/2022   MCV 80.3 02/06/2021   Lab Results  Component Value Date   TSH 3.220 04/19/2023   Lab Results  Component Value Date   HGBA1C 5.4 05/07/2022      BNP    Component Value Date/Time   BNP 171.7 (H) 10/21/2019 0559    ProBNP    Component Value Date/Time   PROBNP 58.0 12/07/2019 1618     Lipid Panel     Component Value Date/Time   CHOL 126 04/19/2023 1008   TRIG 98 04/19/2023 1008   HDL 36 (L) 04/19/2023 1008   CHOLHDL 3.5 04/19/2023 1008   CHOLHDL 7 02/11/2023 1433   VLDL 36.2 02/11/2023 1433   LDLCALC 71 04/19/2023 1008   LDLDIRECT 141.0 06/11/2016 1639   LABVLDL 19 04/19/2023 1008     RADIOLOGY: No results found.   ECHO: 03/03/2023  1. Left ventricular ejection fraction, by estimation, is 55 to 60%. Left  ventricular ejection fraction by 3D volume is 59 %. The left ventricle has  normal function. The left ventricle has no regional wall motion  abnormalities. Left ventricular diastolic   parameters are consistent with Grade I diastolic dysfunction (impaired  relaxation).   2. Right ventricular systolic function is normal. The right ventricular  size is normal. There is normal pulmonary artery systolic pressure. The  estimated right ventricular systolic pressure is 21.3 mmHg.   3. The mitral valve is grossly normal. Trivial mitral valve  regurgitation.   4. The aortic valve is tricuspid. Aortic valve regurgitation is moderate.   5. Aortic dilatation noted. There is mild dilatation of the aortic root,  measuring 40 mm. There is borderline dilatation of the ascending aorta,  measuring 39 mm.   6. The inferior vena cava is normal in size with greater than 50%  respiratory variability, suggesting right atrial pressure of 3 mmHg.   Comparison(s): Changes from prior study are noted. 01/12/2020: LVEF 60-65%,  mild to moderate AI, aorta dilated to 37 mm.    Additional studies/ records that were reviewed today include:  The patient's prior cardiac catheterization from September 2019 was reviewed.  Records of Dr. Francine Graven, Dr. Zola Button an echo was reviewed   ASSESSMENT:    1. Coronary artery disease  involving native coronary artery of native heart with other form of angina pectoris (HCC)   2. Mixed hyperlipidemia   3. Dizziness   4. Bilateral lower extremity edema   5. Neuropathy   6. OSA (obstructive sleep apnea)   7. Post-COVID chronic dyspnea   8. Screening for thyroid disorder      PLAN:  Anthony King is a 72 year old gentleman with mild previously documented coronary artery disease by cardiac catheterization in 2019 which revealed 40% second diagonal stenosis.  He had normal LV function.  He has a history of remote tobacco use, and also developed COVID with post COVID-19 pulmonary fibrosis for which she has been followed by Dr. Melody Comas of pulmonary.  He also was found to have severe obstructive sleep apnea and initially had been on CPAP therapy but ultimately discontinued treatment.  He sees Dr. Seabron Spates for primary care with her last evaluation in August 2024.  He has experience some episodes of shortness of breath and was referred for a 2D echo Doppler study which was done on March 03, 2023.  This revealed normal LV function with EF 55 to 60% with grade 1 diastolic dysfunction.  RV function and pressures were normal.  He did have trivial MR and there was evidence for a trileaflet aortic valve with moderate aortic regurgitation.  There was mild dilation of his aortic root measuring 40 mm with borderline dilation of ascending aorta at 39 mm.  Recently, he experienced an episode of dizziness while singing at church leading to his present evaluation.  He admits to leg swelling and on exam there is mild bilateral lower extremity edema.  Previously his left foot was swollen more but this improved.  Presently, I am recommending he undergo follow-up laboratory with a comprehensive metabolic panel, TSH, fasting lipid panel as well as LP(a).  I reviewed his most recent echo Doppler findings with him.  I am scheduling him for a coronary CTA exam for reassessment of  his previous mild nonobstructive CAD.  Blood pressure today is stable.  He has been taking furosemide 20 mg daily for leg edema with some improvement.  He has been on rosuvastatin for 3 months since his lipid panel in August 2024 was markedly elevated with total cholesterol 240, LDL cholesterol 171, triglycerides 181, and low HDL at 30-46.  Medication adjustment will be made upon completion of his laboratory assessment.  I will see him back in the office in 2 months for reevaluation or sooner as needed.   Medication Adjustments/Labs and Tests Ordered: Current medicines are reviewed at length with the patient today.  Concerns regarding medicines are outlined above.  Medication changes, Labs and Tests ordered today are listed in the Patient Instructions below. Patient Instructions  Medication Instructions:  NO MEDICATION CHANGES *If you need a refill on your cardiac medications before your next appointment, please call your pharmacy*   Lab Work: LIPID, CMET, TSH LPa If you have labs (blood work) drawn today and your tests are completely normal, you will receive your results only by: MyChart Message (if you have MyChart) OR A paper copy in the mail If you have any lab test that is abnormal or we need to change your treatment, we will call you to review the results.   Testing/Procedures:   Your cardiac CT will be scheduled at one of the below locations:   [facility] [address] [place], [place] [address] [phone]  OR  [facility] [address] [place], [place] [address] [phone]  OR   [facility] [address] [place], [place] [address] [phone]  If scheduled at [facility], please arrive at the [facility] and Children's Entrance (Entrance C2) of [facility] 30 minutes prior to test start time. You can use the FREE valet parking offered at entrance C (encouraged  to control the heart rate for the test)  Proceed to the [facility] Radiology Department (first floor) to check-in and test prep.  All radiology patients and guests should use entrance C2 at [facility], accessed from [facility], even though the hospital's physical address listed is [address].    If scheduled at [facility] or [facility], please arrive 15 mins early for check-in and test prep.  There is spacious parking and easy access to the radiology department from the [facility] entrance. Please enter here and check-in with the desk attendant.   Please follow these instructions carefully (unless otherwise directed):  An IV will be required for this test and Nitroglycerin will be given.  Hold all erectile dysfunction medications at least 3 days (72 hrs) prior to test. (Ie viagra, cialis, sildenafil, tadalafil, etc)   On the Night Before the Test: Be sure to Drink plenty of water. Do not consume any caffeinated/decaffeinated beverages or chocolate 12 hours prior to your test. Do not take any antihistamines 12 hours prior to your test. If the patient has contrast allergy: Patient will need a prescription for Prednisone and very clear instructions (as follows): Prednisone 50 mg - take 13 hours prior to test Take another Prednisone 50 mg 7 hours prior to test Take another Prednisone 50 mg 1 hour prior to test Take Benadryl 50 mg 1 hour prior to test Patient must complete all four doses of above prophylactic medications. Patient will need a ride after test due to Benadryl.  On the Day of the Test: Drink plenty of water until 1 hour prior to the test. Do not eat any food 1 hour prior to test. You may take your regular medications prior to the test.  Take metoprolol (Lopressor) two hours prior to test. If you take Furosemide/Hydrochlorothiazide/Spironolactone, please HOLD on the morning of the  test. FEMALES- please wear  underwire-free bra if available, avoid dresses & tight clothing      After the Test: Drink plenty of water. After receiving IV contrast, you may experience a mild flushed feeling. This is normal. On occasion, you may experience a mild rash up to 24 hours after the test. This is not dangerous. If this occurs, you can take Benadryl 25 mg and increase your fluid intake. If you experience trouble breathing, this can be serious. If it is severe call 911 IMMEDIATELY. If it is mild, please call our office. If you take any of these medications: Glipizide/Metformin, Avandament, Glucavance, please do not take 48 hours after completing test unless otherwise instructed.  We will call to schedule your test 2-4 weeks out understanding that some insurance companies will need an authorization prior to the service being performed.   For more information and frequently asked questions, please visit our website : [email or web address]  For non-scheduling related questions, please contact the cardiac imaging nurse navigator should you have any questions/concerns: Cardiac Imaging Nurse Navigators Direct Office Dial: [phone]   For scheduling needs, including cancellations and rescheduling, please call [place], [phone].    Follow-Up: At [facility], you and your health needs are our priority.  As part of our continuing mission to provide you with exceptional heart care, we have created designated Provider Care Teams.  These Care Teams include your primary Cardiologist (physician) and Advanced Practice Providers (APPs -  Physician Assistants and Nurse Practitioners) who all work together to provide you with the care you need, when you need it.  We recommend signing up for the patient portal called "MyChart".  Sign up information is provided on this After Visit Summary.  MyChart is used to connect with patients for Virtual Visits (Telemedicine).  Patients  are able to view lab/test results, encounter notes, upcoming appointments, etc.  Non-urgent messages can be sent to your provider as well.   To learn more about what you can do with MyChart, go to [email or web address].    Your next appointment:   6-8 week(s)  Nicki Guadalajara, MD      Signed, Nicki Guadalajara, MD  04/24/2023 12:42 PM    [facility] [address], [place], [place]  [address] Phone: [phone]

## 2023-04-19 NOTE — Patient Instructions (Addendum)
Medication Instructions:  NO MEDICATION CHANGES *If you need a refill on your cardiac medications before your next appointment, please call your pharmacy*   Lab Work: LIPID, CMET, TSH LPa If you have labs (blood work) drawn today and your tests are completely normal, you will receive your results only by: MyChart Message (if you have MyChart) OR A paper copy in the mail If you have any lab test that is abnormal or we need to change your treatment, we will call you to review the results.   Testing/Procedures:   Your cardiac CT will be scheduled at one of the below locations:   [facility] [address] [place], [place] [address] [phone]  OR  [facility] [address] [place], [place] [address] [phone]  OR   [facility] [address] [place], [place] [address] [phone]  If scheduled at [facility], please arrive at the [facility] and Children's Entrance (Entrance C2) of [facility] 30 minutes prior to test start time. You can use the FREE valet parking offered at entrance C (encouraged to control the heart rate for the test)  Proceed to the [facility] Radiology Department (first floor) to check-in and test prep.  All radiology patients and guests should use entrance C2 at [facility], accessed from [facility], even though the hospital's physical address listed is [address].    If scheduled at [facility] or [facility], please arrive 15 mins early for check-in and test prep.  There is spacious parking and easy access to the radiology department from the [facility] Heart and Vascular entrance. Please enter here and check-in with the desk attendant.   Please follow these instructions carefully (unless otherwise directed):  An IV will be required for this test and Nitroglycerin will be  given.  Hold all erectile dysfunction medications at least 3 days (72 hrs) prior to test. (Ie viagra, cialis, sildenafil, tadalafil, etc)   On the Night Before the Test: Be sure to Drink plenty of water. Do not consume any caffeinated/decaffeinated beverages or chocolate 12 hours prior to your test. Do not take any antihistamines 12 hours prior to your test. If the patient has contrast allergy: Patient will need a prescription for Prednisone and very clear instructions (as follows): Prednisone 50 mg - take 13 hours prior to test Take another Prednisone 50 mg 7 hours prior to test Take another Prednisone 50 mg 1 hour prior to test Take Benadryl 50 mg 1 hour prior to test Patient must complete all four doses of above prophylactic medications. Patient will need a ride after test due to Benadryl.  On the Day of the Test: Drink plenty of water until 1 hour prior to the test. Do not eat any food 1 hour prior to test. You may take your regular medications prior to the test.  Take metoprolol (Lopressor) two hours prior to test. If you take Furosemide/Hydrochlorothiazide/Spironolactone, please HOLD on the morning of the test. FEMALES- please wear underwire-free bra if available, avoid dresses & tight clothing      After the Test: Drink plenty of water. After receiving IV contrast, you may experience a mild flushed feeling. This is normal. On occasion, you may experience a mild rash up to 24 hours after the test. This is not dangerous. If this occurs, you can take Benadryl 25 mg and increase your fluid intake. If you experience trouble  breathing, this can be serious. If it is severe call 911 IMMEDIATELY. If it is mild, please call our office. If you take any of these medications: Glipizide/Metformin, Avandament, Glucavance, please do not take 48 hours after completing test unless otherwise instructed.  We will call to schedule your test 2-4 weeks out understanding that some insurance companies  will need an authorization prior to the service being performed.   For more information and frequently asked questions, please visit our website : [email or web address]  For non-scheduling related questions, please contact the cardiac imaging nurse navigator should you have any questions/concerns: Cardiac Imaging Nurse Navigators Direct Office Dial: [phone]   For scheduling needs, including cancellations and rescheduling, please call [place], [phone].    Follow-Up: At [facility], you and your health needs are our priority.  As part of our continuing mission to provide you with exceptional heart care, we have created designated Provider Care Teams.  These Care Teams include your primary Cardiologist (physician) and Advanced Practice Providers (APPs -  Physician Assistants and Nurse Practitioners) who all work together to provide you with the care you need, when you need it.  We recommend signing up for the patient portal called "MyChart".  Sign up information is provided on this After Visit Summary.  MyChart is used to connect with patients for Virtual Visits (Telemedicine).  Patients are able to view lab/test results, encounter notes, upcoming appointments, etc.  Non-urgent messages can be sent to your provider as well.   To learn more about what you can do with MyChart, go to [email or web address].    Your next appointment:   6-8 week(s)  Nicki Guadalajara, MD

## 2023-04-20 LAB — COMPREHENSIVE METABOLIC PANEL
ALT: 20 [IU]/L (ref 0–44)
AST: 20 [IU]/L (ref 0–40)
Albumin: 4.2 g/dL (ref 3.8–4.8)
Alkaline Phosphatase: 87 [IU]/L (ref 44–121)
BUN/Creatinine Ratio: 14 (ref 10–24)
BUN: 18 mg/dL (ref 8–27)
Bilirubin Total: 1.3 mg/dL — ABNORMAL HIGH (ref 0.0–1.2)
CO2: 25 mmol/L (ref 20–29)
Calcium: 9.3 mg/dL (ref 8.6–10.2)
Chloride: 104 mmol/L (ref 96–106)
Creatinine, Ser: 1.28 mg/dL — ABNORMAL HIGH (ref 0.76–1.27)
Globulin, Total: 2 g/dL (ref 1.5–4.5)
Glucose: 92 mg/dL (ref 70–99)
Potassium: 4.7 mmol/L (ref 3.5–5.2)
Sodium: 143 mmol/L (ref 134–144)
Total Protein: 6.2 g/dL (ref 6.0–8.5)
eGFR: 59 mL/min/{1.73_m2} — ABNORMAL LOW (ref >59)

## 2023-04-20 LAB — TSH: TSH: 3.22 u[IU]/mL (ref 0.450–4.500)

## 2023-04-20 LAB — LIPOPROTEIN A (LPA): Lipoprotein (a): 27.6 nmol/L (ref <75.0)

## 2023-04-20 LAB — LIPID PANEL
Chol/HDL Ratio: 3.5 ratio (ref 0.0–5.0)
Cholesterol, Total: 126 mg/dL (ref 100–199)
HDL: 36 mg/dL — ABNORMAL LOW (ref >39)
LDL Chol Calc (NIH): 71 mg/dL (ref 0–99)
Triglycerides: 98 mg/dL (ref 0–149)
VLDL Cholesterol Cal: 19 mg/dL (ref 5–40)

## 2023-04-22 ENCOUNTER — Telehealth: Payer: Self-pay | Admitting: Cardiovascular Disease

## 2023-04-22 NOTE — Telephone Encounter (Signed)
Pt c/o medication issue:  1. Name of Medication: Cholesterol Medication  2. How are you currently taking this medication (dosage and times per day)?   3. Are you having a reaction (difficulty breathing--STAT)? No  4. What is your medication issue? Patient stated at his last office visit Dr. Tresa Endo mentioned changing the dosage on one of his medications. Patient could not remember the name of the medication. Patient would like an update on the medication and would like to have the new dosage amount sent to the pharmacy. Please advise.

## 2023-04-22 NOTE — Telephone Encounter (Signed)
Spoke to pt regarding question about changes to his cholesterol med.  Per las OV on 04/19/2023, there were no medication changes noted.  He did have labs drawn.  Pt stated, "I just now realized that they won't change anything until my blood work comes back".  Pt verbalized understanding of continuing current cholesterol medication dose.  He is scheduled to see Dr. Tresa Endo on 06/02/2023.

## 2023-04-24 ENCOUNTER — Encounter: Payer: Self-pay | Admitting: Cardiovascular Disease

## 2023-04-26 ENCOUNTER — Telehealth (HOSPITAL_COMMUNITY): Payer: Self-pay | Admitting: *Deleted

## 2023-04-26 NOTE — Telephone Encounter (Signed)
Reaching out to patient to offer assistance regarding upcoming cardiac imaging study; pt verbalizes understanding of appt date/time, parking situation and where to check in, pre-test NPO status and medications ordered, and verified current allergies; name and call back number provided for further questions should they arise  Larey Brick RN Navigator Cardiac Imaging [facility] [phone] office [phone] cell  Patient to take 25mg  metoprolol tartrate two hours prior to his cardiac CT scan. He is aware to arrive at 12:30 PM.

## 2023-04-27 ENCOUNTER — Ambulatory Visit (HOSPITAL_COMMUNITY)
Admission: RE | Admit: 2023-04-27 | Discharge: 2023-04-27 | Disposition: A | Discharge status: Home / Self Care | Payer: Medicare Other | Source: Ambulatory Visit | Attending: Cardiovascular Disease | Admitting: Cardiovascular Disease

## 2023-04-27 DIAGNOSIS — I25118 Atherosclerotic heart disease of native coronary artery with other forms of angina pectoris: Secondary | ICD-10-CM | POA: Diagnosis not present

## 2023-04-27 MED ORDER — IOHEXOL 350 MG/ML SOLN
95.0000 mL | Freq: Once | INTRAVENOUS | Status: AC | PRN
  Administered 2023-04-27: 95 mL via INTRAVENOUS

## 2023-04-27 MED ORDER — NITROGLYCERIN 0.4 MG SL SUBL
SUBLINGUAL_TABLET | SUBLINGUAL | Status: AC
  Filled 2023-04-27: qty 2

## 2023-04-27 MED ORDER — NITROGLYCERIN 0.4 MG SL SUBL
0.8000 mg | SUBLINGUAL_TABLET | Freq: Once | SUBLINGUAL | Status: AC
  Administered 2023-04-27: 0.8 mg via SUBLINGUAL

## 2023-05-21 ENCOUNTER — Telehealth: Payer: Self-pay | Admitting: Family Medicine

## 2023-05-21 NOTE — Telephone Encounter (Signed)
Patient states he has finished his Lasix and does not know if provider wants him to stay on due to his cholesterol has decreased. Please advise

## 2023-05-24 NOTE — Telephone Encounter (Signed)
Pt called. LVM to return call

## 2023-05-25 NOTE — Telephone Encounter (Signed)
PT called back. Please advise.

## 2023-05-31 ENCOUNTER — Telehealth: Payer: Self-pay | Admitting: Family Medicine

## 2023-05-31 ENCOUNTER — Other Ambulatory Visit: Payer: Self-pay | Admitting: Family Medicine

## 2023-05-31 DIAGNOSIS — R600 Localized edema: Secondary | ICD-10-CM

## 2023-05-31 MED ORDER — ROSUVASTATIN CALCIUM 20 MG PO TABS: 20.0000 mg | ORAL_TABLET | Freq: Every day | ORAL | 2 refills | Status: DC

## 2023-05-31 MED ORDER — FUROSEMIDE 20 MG PO TABS: 20.0000 mg | ORAL_TABLET | Freq: Every day | ORAL | 3 refills | Status: AC

## 2023-05-31 NOTE — Telephone Encounter (Signed)
Pt came in office stating is needing refill on rosuvastatin (CRESTOR) 20 MG tablet  sent to [facility] [address] ([address]), [place] - [address], [place] ([place]) [place] [address] Phone: [phone]  Fax: [phone]. Please advise. Pt tel [phone]

## 2023-05-31 NOTE — Telephone Encounter (Signed)
Rx sent 

## 2023-06-02 ENCOUNTER — Ambulatory Visit: Payer: Medicare Other | Attending: Cardiovascular Disease | Admitting: Cardiovascular Disease

## 2023-06-02 ENCOUNTER — Encounter: Payer: Self-pay | Admitting: Cardiovascular Disease

## 2023-06-02 DIAGNOSIS — R0789 Other chest pain: Secondary | ICD-10-CM | POA: Diagnosis not present

## 2023-06-02 DIAGNOSIS — I700 Atherosclerosis of aorta: Secondary | ICD-10-CM

## 2023-06-02 DIAGNOSIS — I2510 Atherosclerotic heart disease of native coronary artery without angina pectoris: Secondary | ICD-10-CM

## 2023-06-02 DIAGNOSIS — I200 Unstable angina: Secondary | ICD-10-CM

## 2023-06-02 DIAGNOSIS — Q2112 Patent foramen ovale: Secondary | ICD-10-CM

## 2023-06-02 DIAGNOSIS — E782 Mixed hyperlipidemia: Secondary | ICD-10-CM | POA: Diagnosis not present

## 2023-06-02 DIAGNOSIS — I25118 Atherosclerotic heart disease of native coronary artery with other forms of angina pectoris: Secondary | ICD-10-CM

## 2023-06-02 DIAGNOSIS — I77810 Thoracic aortic ectasia: Secondary | ICD-10-CM | POA: Diagnosis not present

## 2023-06-02 MED ORDER — ROSUVASTATIN CALCIUM 20 MG PO TABS: 20.0000 mg | ORAL_TABLET | Freq: Every day | ORAL | 3 refills | Status: AC

## 2023-06-02 NOTE — Progress Notes (Signed)
Cardiology Office Note    Date:  06/04/2023   ID:  EXZAVION [name], DOB January 02, 1951, MRN [id number]  PCP:  Zola Button, Grayling Congress, DO  Cardiologist:  Nicki Guadalajara, MD   6-week follow-up cardiology evaluation   History of Present Illness:  Anthony King is a 72 y.o. male who is followed by Seabron Spates for primary care.  He has a history of prior chest pain which led to definitive cardiac catheterization performed by Dr. Eldridge Dace on March 21, 2018.  He was found to have mild nonobstructive CAD with 40% narrowing in the second diagonal branch of his LAD.  LVEDP was normal at 9 mm.  There was no evidence for aortic stenosis.  He has been followed by Dr. Melody Comas of pulmonary and has obstructive sleep apnea for which he had been using CPAP but ultimately stopped using therapy.  He does have issues with bilateral lower extremity edema and has had issues with neuropathy.  In addition, he required 3 inguinal hernia surgeries.  He has had issues with low back pain and sciatica and uses a TENS unit.  He had significant episode of COVID in 2020 at which time he was hospitalized for greater than a week required supplemental oxygen and has noticed some shortness of breath ever since.  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  He last saw his primary physician in August 2021 and with some mild shortness of breath he underwent a 2D echo Doppler study on March 03, 2023.  This showed normal LV function with EF 55 to 60% with grade 1 diastolic dysfunction.  RV pressure were normal.  His aortic valve was trileaflet although there was evidence for moderate aortic regurgitation.  There was very mild aortic dilatation of the aortic root at 40 mm and borderline date dilation of ascending aorta at 39 mm.  He had trivial MR.  Apparently, he recently has experienced some episodes of dizziness and experienced an episode at church while singing.  He was added onto my schedule on  April 19, 2023 for cardiology evaluation.  During my initial evaluation, I thoroughly reviewed his most recent echo Doppler findings.  I scheduled him for coronary CTA exam for reassessment of his previous mild nonobstructive CAD.  His blood pressure was stable.  He was taken furosemide for leg edema with improvement.  He had been on rosuvastatin for 3 months since his lipid panel in August 2024 showed total cholesterol 240, LDL 171, triglycerides 181 and HDL was low.  Comprehensive repeat fasting laboratory was recommended as well as  LP(a).  Laboratory showed significantly improved lipid studies on April 19, 2023 with total cholesterol 126, triglycerides 98, HDL 36, and LDL 71.  LP(a) was normal at 27.6.  Serum creatinine was borderline elevated at 1.28 coronary CTA showed a calcium score of 16.8, representing 19th percentile.  Total plaque volume was 41.3 mm (4th percentile with calcified plaque 1 mm.  TP V was felt to be mild.  There was minimally mixed density plaque of less than 20% in left main and LAD.  He is aortic root was moderately dilated at 43 mm.  Chest CT over read did not show significant extracardiac findings.  Of note he does have a small PFO and a accessory LA appendage was noted.  Since I saw him, he feels well but does experience some fatigability.  He denies any exertional chest pain but at times has had some nonexertional  chest soreness.  He also admits to having some occasional lower abdominal pain.  He apparently had run out of his rosuvastatin and this will be renewed.  He presents for evaluation.    Past Medical History:  Diagnosis Date   Arthritis 07/17/2011   hands, hips, knees   Basal cell carcinoma    nose   Colon polyps    ADENOMATOUS AND HYPERPLASTIC POLYPS   Coronary artery disease    COVID-19    Diverticulosis    DOE (dyspnea on exertion) 12/07/2019   Onset with covid 19 pna  - see adimit 10/18/19  -  12/07/2019   Walked RA  3 laps @ approx 231ft each @  slow slt unsteady  pace  stopped due to sob / weakness but sats never less than 92%    Dyspnea    with exertion   Gastric ulcer    GERD (gastroesophageal reflux disease)    Headache    Hemorrhoids    History of kidney stones    Hypoxia 10/18/2019   Left lower quadrant abdominal pain 05/31/2018   Pneumonia    Pre-diabetes    Wears glasses     Past Surgical History:  Procedure Laterality Date   APPENDECTOMY     BASAL CELL CARCINOMA EXCISION     nose   CARDIAC CATHETERIZATION     COLONOSCOPY     EXCISION OF MESH Right 02/15/2020   Procedure: EXPLANTATION OF MESH;  Surgeon: Harriette Bouillon, MD;  Location: [facility];  Service: General;  Laterality: Right;   fatty tumors  07/17/2011   excised x4- 1 -neck, 1 arm, 2 chest   GROIN DISSECTION Right 02/15/2020   Procedure: RIGHT GROIN NEURECTOMY;  Surgeon: Harriette Bouillon, MD;  Location: [facility];  Service: General;  Laterality: Right;  DR Leeanne Mannan APPROVED CORNETT USING HIS TIME PER ABBIE   HEMORRHOID BANDING     HEMORRHOID SURGERY N/A 05/15/2022   Procedure: RECTAL EXAM UNDER ANESTHESIA WITH HEMORRHOIDECTOMY;  Surgeon: Luretha Murphy, MD;  Location: [facility];  Service: General;  Laterality: N/A;   INGUINAL HERNIA REPAIR  07/23/2011   Procedure: LAPAROSCOPIC INGUINAL HERNIA;  Surgeon: Almond Lint, MD;  Location: [facility];  Service: General;  Laterality: Left;   INGUINAL HERNIA REPAIR Bilateral 04/19/2019   Procedure: BILATERAL OPEN INGUINAL HERNIA REPAIR WITH MESH;  Surgeon: Harriette Bouillon, MD;  Location: [facility];  Service: General;  Laterality: Bilateral;   INGUINAL HERNIA REPAIR Right 02/15/2020   Procedure: RIGHT HERNIA REPAIR INGUINAL WITH MESH;  Surgeon: Harriette Bouillon, MD;  Location: [facility];  Service: General;  Laterality: Right;   KNEE ARTHROSCOPY Left    LAPAROSCOPY N/A 02/15/2020   Procedure: LAPAROSCOPY DIAGNOSTIC;  Surgeon: Harriette Bouillon, MD;  Location: [facility];  Service: General;  Laterality: N/A;   LEFT HEART CATH  AND CORONARY ANGIOGRAPHY N/A 03/21/2018   Procedure: LEFT HEART CATH AND CORONARY ANGIOGRAPHY;  Surgeon: Corky Crafts, MD;  Location: [facility];  Service: Cardiovascular;  Laterality: N/A;   MASS EXCISION N/A 08/29/2013   Procedure: EXCISION LEFT FLANK MASS/RIGHT EXCISION CHEST WALL MASS;  Surgeon: Almond Lint, MD;  Location: [facility];  Service: General;  Laterality: N/A;  Left flank   POLYPECTOMY     TONSILLECTOMY      Current Medications: Outpatient Medications Prior to Visit  Medication Sig Dispense Refill   furosemide (LASIX) 20 MG tablet Take 1 tablet (20 mg total) by mouth daily. 90 tablet 3   lansoprazole (PREVACID)  15 MG capsule Take 15 mg by mouth daily at 12 noon.     vitamin B-12 (CYANOCOBALAMIN) 1000 MCG tablet Take 1,000 mcg by mouth daily.      rosuvastatin (CRESTOR) 20 MG tablet Take 1 tablet (20 mg total) by mouth daily. 30 tablet 2   metoprolol tartrate (LOPRESSOR) 50 MG tablet Take 1 tablet (50 mg total) by mouth once for 1 dose. PLEASE TAKE METOPROLOL 2  HOURS PRIOR TO CTA SCAN. 1 tablet 0   No facility-administered medications prior to visit.     Allergies:   Aspirin   Social History   Socioeconomic History   Marital status: Married    Spouse name: Not on file   Number of children: 1   Years of education: Not on file   Highest education level: Not on file  Occupational History   Occupation: self-employed , covers furniture  Tobacco Use   Smoking status: Former    Current packs/day: 0.00    Types: Cigarettes    Start date: 1963    Quit date: 07/02/1991    Years since quitting: 31.9   Smokeless tobacco: Never  Vaping Use   Vaping status: Never Used  Substance and Sexual Activity   Alcohol use: No   Drug use: No   Sexual activity: Yes  Other Topics Concern   Not on file  Social History Narrative   Exercise- no   Right handed   No caffeine   One story home   Social Determinants of Health   Financial Resource  Strain: Not on file  Food Insecurity: No Food Insecurity (08/30/2020)   Hunger Vital Sign    Worried About Running Out of Food in the Last Year: Never true    Ran Out of Food in the Last Year: Never true  Transportation Needs: No Transportation Needs (08/30/2020)   PRAPARE - Administrator, Civil Service (Medical): No    Lack of Transportation (Non-Medical): No  Physical Activity: Not on file  Stress: Not on file  Social Connections: Not on file    Social history is notable that he was born in [place] near [place].  He subsequently moved to [place] and lives in the [place] area.  He is married for 51 years.  He has 1 child and 3 grandchildren.  He had worked in Public affairs consultant.  There is remote tobacco history but fortunately he quit in 1992.  There is no illicit drug use.  He had previously walked  3 miles per day for 13 years.  He stays active.   Family History:  The patient's family history includes Alzheimer's disease in his mother; Arthritis in his maternal grandmother, mother, and paternal grandmother; Breast cancer in his mother; Colon cancer in his brother and father; Pneumonia in his mother; Stomach cancer in his father and sister.   Mother died at age 94 with old age but developed COVID.  Father died at age 17 occult stomach cancer.  He has a brother who is 1 with urologic cancer.  His sister died at age 34 with stomach cancer.  This child is 58 years old.  ROS General: Negative; No fevers, chills, or night sweats;  HEENT: Negative; No changes in vision or hearing, sinus congestion, difficulty swallowing Pulmonary: Negative; No cough, wheezing, shortness of breath, hemoptysis Cardiovascular: See HPI GI: Colonic polyps GU: Negative; No dysuria, hematuria, or difficulty voiding Musculoskeletal: Low back pain, sciatica Hematologic/Oncology: Negative; no easy bruising, bleeding Endocrine: Negative; no  heat/cold intolerance; no diabetes Neuro:  Negative; no changes in balance, headaches Skin: Negative; No rashes or skin lesions Psychiatric: Negative; No behavioral problems, depression Sleep: Previously diagnosed with OSA, had been on CPAP followed by Dr. Melody Comas, currently not on therapy Other comprehensive 14 point system review is negative.   PHYSICAL EXAM:   VS:  BP 134/86   Pulse 72   Ht 5\' 10"  (1.778 m)   Wt 220 lb 3.2 oz (99.9 kg)   SpO2 97%   BMI 31.60 kg/m     Repeat blood pressure by me was 126/74  Wt Readings from Last 3 Encounters:  06/02/23 220 lb 3.2 oz (99.9 kg)  04/19/23 221 lb 9.6 oz (100.5 kg)  02/11/23 218 lb 3.2 oz (99 kg)    General: Alert, oriented, no distress.  Skin: normal turgor, no rashes, warm and dry HEENT: Normocephalic, atraumatic. Pupils equal round and reactive to light; sclera anicteric; extraocular muscles intact;  Nose without nasal septal hypertrophy Mouth/Parynx benign; Mallinpatti scale 3 Neck: No JVD, no carotid bruits; normal carotid upstroke Lungs: clear to ausculatation and percussion; no wheezing or rales Chest wall: Mild chest wall tenderness over the costochondral region consistent with musculoskeletal chest pain Heart: PMI not displaced, RRR, s1 s2 normal, 1/6 systolic murmur, 1/6 diastolic murmur, no rubs, gallops, thrills, or heaves Abdomen: soft, nontender; no hepatosplenomehaly, BS+; abdominal aorta nontender and not dilated by palpation. Back: no CVA tenderness Pulses 2+ Musculoskeletal: full range of motion, normal strength, no joint deformities Extremities: Bilateral lower extremity mild edema, no clubbing cyanosis, Homan's sign negative  Neurologic: grossly nonfocal; Cranial nerves grossly wnl Psychologic: Normal mood and affect   Studies/Labs Reviewed:   EKG Interpretation Date/Time:  Wednesday June 02 2023 11:53:15 EST Ventricular Rate:  72 PR Interval:  166 QRS Duration:  92 QT Interval:  394 QTC Calculation: 431 R Axis:   31  Text  Interpretation: Normal sinus rhythm Nonspecific T wave abnormality When compared with ECG of 19-Apr-2023 08:55, No significant change was found Confirmed by Nicki Guadalajara ([id number]) on 06/04/2023 10:30:06 AM    Recent Labs:    Latest Ref Rng & Units 04/19/2023   10:08 AM 02/11/2023    2:33 PM 05/07/2022    8:32 AM  BMP  Glucose 70 - 99 mg/dL 92  86  604   BUN 8 - 27 mg/dL 18  15  16    Creatinine 0.76 - 1.27 mg/dL 5.40  9.81  1.91   BUN/Creat Ratio 10 - 24 14     Sodium 134 - 144 mmol/L 143  129  142   Potassium 3.5 - 5.2 mmol/L 4.7  4.1  4.2   Chloride 96 - 106 mmol/L 104  97  111   CO2 20 - 29 mmol/L 25  27  25    Calcium 8.6 - 10.2 mg/dL 9.3  9.6  9.1         Latest Ref Rng & Units 04/19/2023   10:08 AM 02/11/2023    2:33 PM 02/06/2021   10:30 AM  Hepatic Function  Total Protein 6.0 - 8.5 g/dL 6.2  6.6  6.3   Albumin 3.8 - 4.8 g/dL 4.2  4.4  4.1   AST 0 - 40 IU/L 20  22  20    ALT 0 - 44 IU/L 20  22  20    Alk Phosphatase 44 - 121 IU/L 87  84  99   Total Bilirubin 0.0 - 1.2 mg/dL 1.3  2.1  1.0  Latest Ref Rng & Units 02/11/2023    2:33 PM 01/01/2022    7:49 PM 02/06/2021   10:30 AM  CBC  WBC 4.0 - 10.5 K/uL 7.6  10.0  5.2   Hemoglobin 13.0 - 17.0 g/dL 63.8  75.6  43.3   Hematocrit 39.0 - 52.0 % 49.5  46.2  44.6   Platelets 150.0 - 400.0 K/uL 206.0  194  177.0    Lab Results  Component Value Date   MCV 84.5 02/11/2023   MCV 83.5 01/01/2022   MCV 80.3 02/06/2021   Lab Results  Component Value Date   TSH 3.220 04/19/2023   Lab Results  Component Value Date   HGBA1C 5.4 05/07/2022     BNP    Component Value Date/Time   BNP 171.7 (H) 10/21/2019 0559    ProBNP    Component Value Date/Time   PROBNP 58.0 12/07/2019 1618     Lipid Panel     Component Value Date/Time   CHOL 126 04/19/2023 1008   TRIG 98 04/19/2023 1008   HDL 36 (L) 04/19/2023 1008   CHOLHDL 3.5 04/19/2023 1008   CHOLHDL 7 02/11/2023 1433   VLDL 36.2 02/11/2023 1433   LDLCALC 71  04/19/2023 1008   LDLDIRECT 141.0 06/11/2016 1639   LABVLDL 19 04/19/2023 1008     RADIOLOGY: No results found.   ECHO: 03/03/2023  1. Left ventricular ejection fraction, by estimation, is 55 to 60%. Left  ventricular ejection fraction by 3D volume is 59 %. The left ventricle has  normal function. The left ventricle has no regional wall motion  abnormalities. Left ventricular diastolic   parameters are consistent with Grade I diastolic dysfunction (impaired  relaxation).   2. Right ventricular systolic function is normal. The right ventricular  size is normal. There is normal pulmonary artery systolic pressure. The  estimated right ventricular systolic pressure is 21.3 mmHg.   3. The mitral valve is grossly normal. Trivial mitral valve  regurgitation.   4. The aortic valve is tricuspid. Aortic valve regurgitation is moderate.   5. Aortic dilatation noted. There is mild dilatation of the aortic root,  measuring 40 mm. There is borderline dilatation of the ascending aorta,  measuring 39 mm.   6. The inferior vena cava is normal in size with greater than 50%  respiratory variability, suggesting right atrial pressure of 3 mmHg.   Comparison(s): Changes from prior study are noted. 01/12/2020: LVEF 60-65%,  mild to moderate AI, aorta dilated to 37 mm.    Additional studies/ records that were reviewed today include:  The patient's prior cardiac catheterization from September 2019 was reviewed.  Records of Dr. Francine Graven, Dr. Zola Button an echo was reviewed   ASSESSMENT:    1. Coronary artery disease involving native coronary artery of native heart without angina pectoris   2. Musculoskeletal chest pain   3. Mixed hyperlipidemia   4. Aortic atherosclerosis (HCC)   5. Small PFO (patent foramen ovale)   6. Dilated aortic root [facility])     PLAN:  Mr. Anthony King is a 72 year old gentleman with mild previously documented coronary artery disease by cardiac catheterization in  2019 which revealed 40% second diagonal stenosis.  He had normal LV function.  He has a history of remote tobacco use, and also developed COVID with post COVID-19 pulmonary fibrosis for which she has been followed by Dr. Melody Comas of pulmonary.  He also was found to have severe obstructive sleep apnea and initially had been on  CPAP therapy but ultimately discontinued treatment.  He sees Dr. Seabron Spates for primary care with her last evaluation in August 2024.  He has experience some episodes of shortness of breath and  a 2D echo Doppler study on March 03, 2023 revealed normal LV function with EF 55 to 60% with grade 1 diastolic dysfunction.  RV function and pressures were normal.  He did have trivial MR and there was evidence for a trileaflet aortic valve with moderate aortic regurgitation.  There was mild dilation of his aortic root measuring 40 mm with borderline dilation of ascending aorta at 39 mm.  In August 2024, lipid studies were significantly elevated with total cholesterol 240, LDL 171, triglycerides 181 and low HDL.  He was started on rosuvastatin 20 mg for 3 months.  Subsequent laboratory on Tober 21 2024 was markedly improved with total cholesterol now at 126, triglycerides 98, HDL 36, and LDL 71.  LP(a) was normal.  Creatinine was 1.28.  Coronary CTA revealed minimal calcium score at 16.8 representing 19th percentile.  There was minimal mixed density plaque less than 25% in the left main and LAD.  Total plaque volume was mild at 41 mm entheses 4th percentile).  On his CT, it was noted that there was a small PFO as well as accessory LA appendage.  There were no significant extracardiac findings within the chest.  He has run out of his rosuvastatin and I will renew this at 20 mg daily.  He has noticed some nonexertional chest discomfort.  He does have chest wall pain today to deep pressure over the costochondral region suggesting mild costochondritis.  I have suggested transient  nonsteroidal anti-inflammatory therapy.  He admits to some fatigue and does mildly snore.  Remotely he had tried CPAP many years ago.  I discussed possible repeat sleep evaluation versus consider customized oral appliance.  He will return to the primary care of Dr. Seabron Spates.  I discussed my plans for future retirement and we will be happy to see him prior to my departure but subsequently he will need to be transition to another cardiology provider.   Medication Adjustments/Labs and Tests Ordered: Current medicines are reviewed at length with the patient today.  Concerns regarding medicines are outlined above.  Medication changes, Labs and Tests ordered today are listed in the Patient Instructions below. Patient Instructions  Medication Instructions:  Your refill have been sent to your pharmacy.  *If you need a refill on your cardiac medications before your next appointment, please call your pharmacy*   Lab Work: If you have labs (blood work) drawn today and your tests are completely normal, you will receive your results only by: MyChart Message (if you have MyChart) OR A paper copy in the mail If you have any lab test that is abnormal or we need to change your treatment, we will call you to review the results.   Follow-Up: At [facility] you and your health needs are our priority.  As part of our continuing mission to provide you with exceptional heart care, we have created designated Provider Care Teams.  These Care Teams include your primary Cardiologist (physician) and Advanced Practice Providers (APPs -  Physician Assistants and Nurse Practitioners) who all work together to provide you with the care you need, when you need it.  We recommend signing up for the patient portal called "MyChart".  Sign up information is provided on this After Visit Summary.  MyChart is used to connect with patients  for Virtual Visits (Telemedicine).  Patients are able to view lab/test  results, encounter notes, upcoming appointments, etc.  Non-urgent messages can be sent to your provider as well.   To learn more about what you can do with MyChart, go to [email or web address].    Your next appointment:   6 month(s)  Provider:   Verne Carrow, MD     Signed, Nicki Guadalajara, MD  06/04/2023 10:32 AM    [facility] [address], [place], [place]  [address] Phone: [phone]

## 2023-06-02 NOTE — Patient Instructions (Signed)
Medication Instructions:  Your refill have been sent to your pharmacy.  *If you need a refill on your cardiac medications before your next appointment, please call your pharmacy*   Lab Work: If you have labs (blood work) drawn today and your tests are completely normal, you will receive your results only by: MyChart Message (if you have MyChart) OR A paper copy in the mail If you have any lab test that is abnormal or we need to change your treatment, we will call you to review the results.   Follow-Up: At [facility], you and your health needs are our priority.  As part of our continuing mission to provide you with exceptional heart care, we have created designated Provider Care Teams.  These Care Teams include your primary Cardiologist (physician) and Advanced Practice Providers (APPs -  Physician Assistants and Nurse Practitioners) who all work together to provide you with the care you need, when you need it.  We recommend signing up for the patient portal called "MyChart".  Sign up information is provided on this After Visit Summary.  MyChart is used to connect with patients for Virtual Visits (Telemedicine).  Patients are able to view lab/test results, encounter notes, upcoming appointments, etc.  Non-urgent messages can be sent to your provider as well.   To learn more about what you can do with MyChart, go to [email or web address].    Your next appointment:   6 month(s)  Provider:   Verne Carrow, MD

## 2023-06-04 ENCOUNTER — Encounter: Payer: Self-pay | Admitting: Cardiovascular Disease

## 2023-06-24 ENCOUNTER — Telehealth: Payer: Self-pay | Admitting: Family Medicine

## 2023-06-24 NOTE — Telephone Encounter (Signed)
FYI pt scheduled 12/27.    Copied from CRM [id number]. Topic: Referral - Request for Referral >> Jun 24, 2023  9:34 AM Desma Mcgregor wrote: Did the patient discuss referral with their provider in the last year? No (If No - schedule appointment) (If Yes - send message)  Appointment offered? Yes and scheduled  Type of order/referral and detailed reason for visit: Groin/stomach pain like before with past hernias  Preference of office, provider, location: [facility], [place], Dr. Luisa Hart  If referral order, have you been seen by this specialty before? Yes (If Yes, this issue or another issue? When? Where?  Same issue, back in 2020 and 2021 at [facility], [facility]  Can we respond through MyChart? Yes

## 2023-06-25 ENCOUNTER — Other Ambulatory Visit: Payer: Self-pay | Admitting: Family Medicine

## 2023-06-25 ENCOUNTER — Ambulatory Visit (HOSPITAL_BASED_OUTPATIENT_CLINIC_OR_DEPARTMENT_OTHER)
Admission: RE | Admit: 2023-06-25 | Discharge: 2023-06-25 | Disposition: A | Discharge status: Home / Self Care | Payer: Medicare Other | Source: Ambulatory Visit | Attending: Family Medicine | Admitting: Family Medicine

## 2023-06-25 ENCOUNTER — Encounter: Payer: Self-pay | Admitting: Family Medicine

## 2023-06-25 ENCOUNTER — Ambulatory Visit (INDEPENDENT_AMBULATORY_CARE_PROVIDER_SITE_OTHER): Payer: Medicare Other | Admitting: Family Medicine

## 2023-06-25 VITALS — BP 110/70 | HR 68 | Temp 98.6°F | Resp 16 | Ht 70.0 in | Wt 221.6 lb

## 2023-06-25 DIAGNOSIS — R1031 Right lower quadrant pain: Secondary | ICD-10-CM

## 2023-06-25 DIAGNOSIS — R39198 Other difficulties with micturition: Secondary | ICD-10-CM

## 2023-06-25 DIAGNOSIS — K8020 Calculus of gallbladder without cholecystitis without obstruction: Secondary | ICD-10-CM | POA: Diagnosis not present

## 2023-06-25 DIAGNOSIS — R1030 Lower abdominal pain, unspecified: Secondary | ICD-10-CM | POA: Diagnosis not present

## 2023-06-25 DIAGNOSIS — K5730 Diverticulosis of large intestine without perforation or abscess without bleeding: Secondary | ICD-10-CM | POA: Diagnosis not present

## 2023-06-25 DIAGNOSIS — N429 Disorder of prostate, unspecified: Secondary | ICD-10-CM | POA: Diagnosis not present

## 2023-06-25 DIAGNOSIS — R8290 Unspecified abnormal findings in urine: Secondary | ICD-10-CM

## 2023-06-25 LAB — POC URINALSYSI DIPSTICK (AUTOMATED)
Blood, UA: NEGATIVE
Glucose, UA: NEGATIVE
Ketones, UA: NEGATIVE
Nitrite, UA: NEGATIVE
Protein, UA: NEGATIVE
Spec Grav, UA: 1.015 (ref 1.010–1.025)
Urobilinogen, UA: 0.2 U/dL
pH, UA: 6 (ref 5.0–8.0)

## 2023-06-25 LAB — COMPREHENSIVE METABOLIC PANEL
ALT: 17 U/L (ref 0–53)
AST: 21 U/L (ref 0–37)
Albumin: 4.2 g/dL (ref 3.5–5.2)
Alkaline Phosphatase: 74 U/L (ref 39–117)
BUN: 17 mg/dL (ref 6–23)
CO2: 28 meq/L (ref 19–32)
Calcium: 9 mg/dL (ref 8.4–10.5)
Chloride: 106 meq/L (ref 96–112)
Creatinine, Ser: 1.44 mg/dL (ref 0.40–1.50)
GFR: 48.51 mL/min — ABNORMAL LOW (ref >60.00)
Glucose, Bld: 87 mg/dL (ref 70–99)
Potassium: 4.8 meq/L (ref 3.5–5.1)
Sodium: 140 meq/L (ref 135–145)
Total Bilirubin: 1 mg/dL (ref 0.2–1.2)
Total Protein: 6.9 g/dL (ref 6.0–8.3)

## 2023-06-25 LAB — PSA: PSA: 0.82 ng/mL (ref 0.10–4.00)

## 2023-06-25 LAB — CBC WITH DIFFERENTIAL/PLATELET
Basophils Absolute: 0 10*3/uL (ref 0.0–0.1)
Basophils Relative: 0.5 % (ref 0.0–3.0)
Eosinophils Absolute: 0.1 10*3/uL (ref 0.0–0.7)
Eosinophils Relative: 1.5 % (ref 0.0–5.0)
HCT: 47.4 % (ref 39.0–52.0)
Hemoglobin: 15.5 g/dL (ref 13.0–17.0)
Lymphocytes Relative: 23.1 % (ref 12.0–46.0)
Lymphs Abs: 2 10*3/uL (ref 0.7–4.0)
MCHC: 32.7 g/dL (ref 30.0–36.0)
MCV: 84.4 fL (ref 78.0–100.0)
Monocytes Absolute: 0.9 10*3/uL (ref 0.1–1.0)
Monocytes Relative: 9.9 % (ref 3.0–12.0)
Neutro Abs: 5.7 10*3/uL (ref 1.4–7.7)
Neutrophils Relative %: 65 % (ref 43.0–77.0)
Platelets: 205 10*3/uL (ref 150.0–400.0)
RBC: 5.62 Mil/uL (ref 4.22–5.81)
RDW: 13.8 % (ref 11.5–15.5)
WBC: 8.7 10*3/uL (ref 4.0–10.5)

## 2023-06-25 MED ORDER — IOHEXOL 300 MG/ML  SOLN
100.0000 mL | Freq: Once | INTRAMUSCULAR | Status: AC | PRN
  Administered 2023-06-25: 100 mL via INTRAVENOUS

## 2023-06-25 NOTE — Patient Instructions (Signed)
Hernia, Adult     A hernia happens when an organ or tissue inside your body pushes out through a weak spot in the muscles of your belly. This makes a bulge. The bulge may be: In a scar from surgery. This type of bulge is called an incisional hernia. Near your belly button. This type is called an umbilical hernia. In your groin, which is the area where your leg meets your lower belly. This type is called an inguinal hernia. If you're male, this type could also be in your scrotum. In your upper thigh. This type is called a femoral hernia. Inside your belly. This type is called a hiatal hernia. It happens when your stomach slides above your diaphragm, which is the muscle between your belly and your chest. What are the causes? You may get a hernia if: You lift heavy things. You cough over a long period of time. You have trouble pooping, also called constipation. Trouble pooping can lead to straining. There's a cut from surgery in your belly. You have a problem that's present at birth. There's fluid around your belly. You're male and have a testicle that hasn't moved down into your scrotum. You may be at greater risk for hernia if: You smoke. You're very overweight. What are the signs or symptoms? The main symptom is a bulge, but the bulge may not always be seen. It may grow bigger or be easier to see when you cough or strain, such as when you lift something heavy. If you can push the bulge back into your belly, it may not cause pain. If you can't push it back into your belly, it may lose its blood supply. This can cause: Pain. Fever. Nausea and vomiting. Swelling. Trouble pooping. How is this diagnosed? A hernia may be diagnosed based on your symptoms, medical history, and an exam. Your health care provider may ask you to cough or move in a way that makes the bulge easier to see. You may also have tests done. These may include: X-rays. Ultrasound. CT scan. How is this treated? A  hernia that's small and painless may not need to be treated. A hernia that's large or painful may be treated with surgery. Surgery involves pushing the bulge back into place and fixing the weak area of the muscle or belly. Follow these instructions at home: Activity Try not to strain. You may have to avoid lifting. Ask how much weight you can safely lift. If you lift something heavy, use your leg muscles. Do not use your back muscles to lift. Prevent trouble pooping You may need to take these actions to prevent or treat trouble pooping: Drink enough fluid to keep your pee (urine) pale yellow. Take medicines to help you poop. Eat foods high in fiber. These include beans, whole grains, and fresh fruits and vegetables. General instructions When you cough, try to cough gently. Try to push the bulge back in by very gently pressing on it when you're lying down. Do not try to force it back in if it won't push in easily. If you're overweight, work with your provider to lose weight safely. Do not smoke, vape, or use products with nicotine or tobacco in them. If you need help quitting, talk with your provider. If you're going to have surgery, watch your hernia for changes in shape, size, or color. Tell your provider about any changes. Contact a health care provider if: You get new pain, swelling, or redness near your hernia. You have trouble pooping.  Your poop is harder or larger than normal. You have a fever or chills. You have nausea or vomiting. Your hernia can't be pushed in. Get help right away if: You have belly pain that gets worse. Your hernia: Changes in shape or size. Changes color. Feels hard or hurts when you touch it. These symptoms may be an emergency. Call 911 right away. Do not wait to see if the symptoms will go away. Do not drive yourself to the hospital. This information is not intended to replace advice given to you by your health care provider. Make sure you discuss any  questions you have with your health care provider. Document Revised: 09/08/2022 Document Reviewed: 09/08/2022 Elsevier Patient Education  2024 [employer].

## 2023-06-25 NOTE — Progress Notes (Signed)
Established Patient Office Visit  Subjective   Patient ID: Anthony King, male    DOB: 03-25-1951  Age: 72 y.o. MRN: [id number]  Chief Complaint  Patient presents with   Groin Pain    X2 weeks, Pt states having lower abdominal pain. Pt states with touch having a stinging sensation. No trouble with urination. Pt states he has not picked up anything heavy recently.    HPI The patient, with a history of COVID-19, presents with persistent weakness and dizziness, particularly when looking upwards. These symptoms have been present for a few months and are intermittent in nature. The patient also reports a constant groin pain that has been present for the last two weeks. The pain is described as being present "twenty-four seven" and does not seem to be associated with urination. The patient also reports a recent change in the appearance of his semen, which has become bloody. This change has been noted over the last couple of instances of sexual activity.  The patient has been trying to stay active and busy, despite his symptoms, but reports difficulty with tasks such as using a chainsaw due to weakness and groin pain.  The patient's appetite and eating habits have remained stable over the last two weeks. He has a history of using a CPAP machine, but has discontinued its use due to discomfort. The patient also reports using an inhaler, but does not find it helpful. The patient's symptoms have been ongoing since his bout with COVID-19 in 2020. Despite his symptoms, the patient expresses a love for life and a desire to be around for his family. Discussed the use of AI scribe software for clinical note transcription with the patient, who gave verbal consent to proceed.  History of Present Illness   The patient, with a history of COVID-19, presents with persistent weakness and dizziness, particularly when looking upwards. These symptoms have been present for a few months and are intermittent in  nature. The patient also reports a constant groin pain that has been present for the last two weeks. The pain is described as being present "twenty-four seven" and does not seem to be associated with urination. The patient also reports a recent change in the appearance of his semen, which has become bloody. This change has been noted over the last couple of instances of sexual activity.  The patient has been trying to stay active and busy, despite his symptoms, but reports difficulty with tasks such as using a chainsaw due to weakness and groin pain.  The patient's appetite and eating habits have remained stable over the last two weeks. He has a history of using a CPAP machine, but has discontinued its use due to discomfort. The patient also reports using an inhaler, but does not find it helpful. The patient's symptoms have been ongoing since his bout with COVID-19 in 2020. Despite his symptoms, the patient expresses a love for life and a desire to be around for his family.          Patient Active Problem List   Diagnosis Date Noted   B12 deficiency 02/11/2023   Need for hepatitis C screening test 02/11/2023   Lower extremity edema 02/11/2023   Pain in joint of left knee 02/25/2022   Pain in right hand 09/26/2021   Muscle cramps 02/06/2021   Bronchitis 02/06/2021   Frequent urination 02/06/2021   Chronic bronchitis with productive mucopurulent cough (HCC) 01/16/2021   Post-COVID chronic decreased mobility and endurance 09/10/2020   Post-COVID  chronic dyspnea 08/12/2020   Other fatigue 08/12/2020   Depression, major, single episode, moderate (HCC) 08/12/2020   DOE (dyspnea on exertion) 12/07/2019   Pneumonia due to COVID-19 virus 10/18/2019   Hypoxia 10/18/2019   Left lower quadrant abdominal pain 05/31/2018   CRF (chronic renal failure), stage 3 (moderate) (HCC) 04/18/2018   CAD (coronary artery disease), native coronary artery 03/19/2018   Aortic atherosclerosis (HCC) 03/19/2018    Prediabetes 03/19/2018   Unstable angina (HCC) 03/19/2018   Chest pain 03/18/2018   CKD (chronic kidney disease), stage III 03/18/2018   Hyperlipidemia 10/04/2017   Left flank mass, 3 cm subfascial 08/21/2013   Mass of chest wall, right, 1 cm, subcutaneous. 08/21/2013   Esophageal reflux 10/28/2011   History of colonic polyps 10/28/2011   Past Medical History:  Diagnosis Date   Arthritis 07/17/2011   hands, hips, knees   Basal cell carcinoma    nose   Colon polyps    ADENOMATOUS AND HYPERPLASTIC POLYPS   Coronary artery disease    COVID-19    Diverticulosis    DOE (dyspnea on exertion) 12/07/2019   Onset with covid 19 pna  - see adimit 10/18/19  -  12/07/2019   Walked RA  3 laps @ approx 227ft each @ slow slt unsteady  pace  stopped due to sob / weakness but sats never less than 92%    Dyspnea    with exertion   Gastric ulcer    GERD (gastroesophageal reflux disease)    Headache    Hemorrhoids    History of kidney stones    Hypoxia 10/18/2019   Left lower quadrant abdominal pain 05/31/2018   Pneumonia    Pre-diabetes    Wears glasses    Past Surgical History:  Procedure Laterality Date   APPENDECTOMY     BASAL CELL CARCINOMA EXCISION     nose   CARDIAC CATHETERIZATION     COLONOSCOPY     EXCISION OF MESH Right 02/15/2020   Procedure: EXPLANTATION OF MESH;  Surgeon: Harriette Bouillon, MD;  Location: [facility];  Service: General;  Laterality: Right;   fatty tumors  07/17/2011   excised x4- 1 -neck, 1 arm, 2 chest   GROIN DISSECTION Right 02/15/2020   Procedure: RIGHT GROIN NEURECTOMY;  Surgeon: Harriette Bouillon, MD;  Location: [facility];  Service: General;  Laterality: Right;  DR Leeanne Mannan APPROVED CORNETT USING HIS TIME PER ABBIE   HEMORRHOID BANDING     HEMORRHOID SURGERY N/A 05/15/2022   Procedure: RECTAL EXAM UNDER ANESTHESIA WITH HEMORRHOIDECTOMY;  Surgeon: Luretha Murphy, MD;  Location: [facility];  Service: General;  Laterality: N/A;   INGUINAL HERNIA REPAIR  07/23/2011    Procedure: LAPAROSCOPIC INGUINAL HERNIA;  Surgeon: Almond Lint, MD;  Location: [facility];  Service: General;  Laterality: Left;   INGUINAL HERNIA REPAIR Bilateral 04/19/2019   Procedure: BILATERAL OPEN INGUINAL HERNIA REPAIR WITH MESH;  Surgeon: Harriette Bouillon, MD;  Location: [facility];  Service: General;  Laterality: Bilateral;   INGUINAL HERNIA REPAIR Right 02/15/2020   Procedure: RIGHT HERNIA REPAIR INGUINAL WITH MESH;  Surgeon: Harriette Bouillon, MD;  Location: [facility];  Service: General;  Laterality: Right;   KNEE ARTHROSCOPY Left    LAPAROSCOPY N/A 02/15/2020   Procedure: LAPAROSCOPY DIAGNOSTIC;  Surgeon: Harriette Bouillon, MD;  Location: [facility];  Service: General;  Laterality: N/A;   LEFT HEART CATH AND CORONARY ANGIOGRAPHY N/A 03/21/2018   Procedure: LEFT HEART CATH AND CORONARY ANGIOGRAPHY;  Surgeon: Corky Crafts, MD;  Location: [facility];  Service: Cardiovascular;  Laterality: N/A;   MASS EXCISION N/A 08/29/2013   Procedure: EXCISION LEFT FLANK MASS/RIGHT EXCISION CHEST WALL MASS;  Surgeon: Almond Lint, MD;  Location: [facility];  Service: General;  Laterality: N/A;  Left flank   POLYPECTOMY     TONSILLECTOMY     Social History   Tobacco Use   Smoking status: Former    Current packs/day: 0.00    Types: Cigarettes    Start date: 1963    Quit date: 07/02/1991    Years since quitting: 32.0   Smokeless tobacco: Never  Vaping Use   Vaping status: Never Used  Substance Use Topics   Alcohol use: No   Drug use: No   Social History   Socioeconomic History   Marital status: Married    Spouse name: Not on file   Number of children: 1   Years of education: Not on file   Highest education level: Not on file  Occupational History   Occupation: self-employed , covers furniture  Tobacco Use   Smoking status: Former    Current packs/day: 0.00    Types: Cigarettes    Start date: 1963    Quit date: 07/02/1991    Years since quitting: 32.0    Smokeless tobacco: Never  Vaping Use   Vaping status: Never Used  Substance and Sexual Activity   Alcohol use: No   Drug use: No   Sexual activity: Yes  Other Topics Concern   Not on file  Social History Narrative   Exercise- no   Right handed   No caffeine   One story home   Social Drivers of Health   Financial Resource Strain: Not on file  Food Insecurity: No Food Insecurity (08/30/2020)   Hunger Vital Sign    Worried About Running Out of Food in the Last Year: Never true    Ran Out of Food in the Last Year: Never true  Transportation Needs: No Transportation Needs (08/30/2020)   PRAPARE - Administrator, Civil Service (Medical): No    Lack of Transportation (Non-Medical): No  Physical Activity: Not on file  Stress: Not on file  Social Connections: Not on file  Intimate Partner Violence: Not on file   Family Status  Relation Name Status   Mother  Deceased at age 56       covid pneumonia   Father  Deceased   Sister  Deceased   Brother  (Not Specified)   MGM  (Not Specified)   PGM  (Not Specified)   Neg Hx  (Not Specified)  No partnership data on file   Family History  Problem Relation Age of Onset   Arthritis Mother    Breast cancer Mother    Pneumonia Mother    Alzheimer's disease Mother    Stomach cancer Father    Colon cancer Father    Stomach cancer Sister        Passed away 12/12   Colon cancer Brother    Arthritis Maternal Grandmother    Arthritis Paternal Grandmother    Esophageal cancer Neg Hx    Rectal cancer Neg Hx    Colon polyps Neg Hx    Allergies  Allergen Reactions   Aspirin Other (See Comments)     nauseated, drooling      Review of Systems  Constitutional:  Negative for fever and malaise/fatigue.  HENT:  Negative for congestion.   Eyes:  Negative for  blurred vision.  Respiratory:  Negative for cough and shortness of breath.   Cardiovascular:  Negative for chest pain, palpitations and leg swelling.  Gastrointestinal:   Positive for abdominal pain. Negative for vomiting.  Genitourinary:  Positive for dysuria and frequency.       BLOOD IN SEMEN  Musculoskeletal:  Negative for back pain.  Skin:  Negative for rash.  Neurological:  Negative for loss of consciousness and headaches.      Objective:     BP 110/70 (BP Location: Right Arm, Patient Position: Sitting, Cuff Size: Large)   Pulse 68   Temp 98.6 F (37 C) (Oral)   Resp 16   Ht 5\' 10"  (1.778 m)   Wt 221 lb 9.6 oz (100.5 kg)   SpO2 98%   BMI 31.80 kg/m  BP Readings from Last 3 Encounters:  06/25/23 110/70  06/02/23 134/86  04/27/23 121/69   Wt Readings from Last 3 Encounters:  06/25/23 221 lb 9.6 oz (100.5 kg)  06/02/23 220 lb 3.2 oz (99.9 kg)  04/19/23 221 lb 9.6 oz (100.5 kg)   SpO2 Readings from Last 3 Encounters:  06/25/23 98%  06/02/23 97%  04/19/23 99%      Physical Exam Vitals and nursing note reviewed.  Constitutional:      General: He is not in acute distress.    Appearance: Normal appearance. He is well-developed.  HENT:     Head: Normocephalic and atraumatic.  Eyes:     General: No scleral icterus.       Right eye: No discharge.        Left eye: No discharge.  Cardiovascular:     Rate and Rhythm: Normal rate and regular rhythm.     Heart sounds: No murmur heard. Pulmonary:     Effort: Pulmonary effort is normal. No respiratory distress.     Breath sounds: Normal breath sounds.  Abdominal:     Tenderness: There is abdominal tenderness. There is guarding. There is no right CVA tenderness or left CVA tenderness.       Comments: SIGNIFICANT PAIN r GROIN AND ? Hernia   Genitourinary:    Testes: Normal.     Prostate: Normal.     Rectum: Normal. Guaiac result negative.     Comments: Pain R groin ? Hernia--- no testicular pain No pain with palp prostate  Heme neg brown stool  Musculoskeletal:        General: Normal range of motion.     Cervical back: Normal range of motion and neck supple.     Right lower  leg: No edema.     Left lower leg: No edema.  Skin:    General: Skin is warm and dry.  Neurological:     Mental Status: He is alert and oriented to person, place, and time.  Psychiatric:        Mood and Affect: Mood normal.        Behavior: Behavior normal.        Thought Content: Thought content normal.        Judgment: Judgment normal.     Results for orders placed or performed in visit on 06/25/23  Comprehensive metabolic panel  Result Value Ref Range   Sodium 140 135 - 145 mEq/L   Potassium 4.8 3.5 - 5.1 mEq/L   Chloride 106 96 - 112 mEq/L   CO2 28 19 - 32 mEq/L   Glucose, Bld 87 70 - 99 mg/dL   BUN 17 6 - 23  mg/dL   Creatinine, Ser 1.61 0.40 - 1.50 mg/dL   Total Bilirubin 1.0 0.2 - 1.2 mg/dL   Alkaline Phosphatase 74 39 - 117 U/L   AST 21 0 - 37 U/L   ALT 17 0 - 53 U/L   Total Protein 6.9 6.0 - 8.3 g/dL   Albumin 4.2 3.5 - 5.2 g/dL   GFR 09.60 (L) >45.40 mL/min   Calcium 9.0 8.4 - 10.5 mg/dL  POCT Urinalysis Dipstick (Automated)  Result Value Ref Range   Color, UA yellow    Clarity, UA clear    Glucose, UA Negative Negative   Bilirubin, UA moderate    Ketones, UA negative    Spec Grav, UA 1.015 1.010 - 1.025   Blood, UA negative    pH, UA 6.0 5.0 - 8.0   Protein, UA Negative Negative   Urobilinogen, UA 0.2 0.2 or 1.0 E.U./dL   Nitrite, UA negative    Leukocytes, UA Trace (A) Negative    Last CBC Lab Results  Component Value Date   WBC 7.6 02/11/2023   HGB 15.8 02/11/2023   HCT 49.5 02/11/2023   MCV 84.5 02/11/2023   MCH 26.9 01/01/2022   RDW 15.5 02/11/2023   PLT 206.0 02/11/2023   Last metabolic panel Lab Results  Component Value Date   GLUCOSE 87 06/25/2023   NA 140 06/25/2023   K 4.8 06/25/2023   CL 106 06/25/2023   CO2 28 06/25/2023   BUN 17 06/25/2023   CREATININE 1.44 06/25/2023   EGFR 59 (L) 04/19/2023   CALCIUM 9.0 06/25/2023   PROT 6.9 06/25/2023   ALBUMIN 4.2 06/25/2023   LABGLOB 2.0 04/19/2023   BILITOT 1.0 06/25/2023    ALKPHOS 74 06/25/2023   AST 21 06/25/2023   ALT 17 06/25/2023   ANIONGAP 6 05/07/2022   Last lipids Lab Results  Component Value Date   CHOL 126 04/19/2023   HDL 36 (L) 04/19/2023   LDLCALC 71 04/19/2023   LDLDIRECT 141.0 06/11/2016   TRIG 98 04/19/2023   CHOLHDL 3.5 04/19/2023   Last hemoglobin A1c Lab Results  Component Value Date   HGBA1C 5.4 05/07/2022   Last thyroid functions Lab Results  Component Value Date   TSH 3.220 04/19/2023   Last vitamin D No results found for: "25OHVITD2", "25OHVITD3", "VD25OH" Last vitamin B12 and Folate Lab Results  Component Value Date   VITAMINB12 873 02/11/2023      The ASCVD Risk score (Arnett DK, et al., 2019) failed to calculate for the following reasons:   The valid total cholesterol range is 130 to 320 mg/dL    Assessment & Plan:   Problem List Items Addressed This Visit   None Visit Diagnoses       Inguinal pain, unspecified laterality    -  Primary   Relevant Orders   POCT Urinalysis Dipstick (Automated) (Completed)   CBC with Differential/Platelet   PSA   Comprehensive metabolic panel (Completed)     Abnormal urine findings       Relevant Orders   Urine Culture     Right lower quadrant abdominal pain       Relevant Orders   CT ABDOMEN PELVIS W CONTRAST   CBC with Differential/Platelet   PSA   Comprehensive metabolic panel (Completed)     Abnormal urination       Relevant Orders   PSA     Disorder of prostate, unspecified       Relevant Orders   PSA  Right groin pain       Relevant Orders   Ambulatory referral to General Surgery     Assessment and Plan    Hematospermia   He reports bloody semen during ejaculation recently without dysuria. Considering prostatitis, urethritis, or other urological conditions, we discussed the potential need for a urological evaluation. We will order urinalysis and refer him to urology.  Urinary Hesitancy   He describes a weak urinary stream and difficulty  initiating urination without pain or pressure. Age or prostate issues may be contributing factors. After discussing the potential need for a urological evaluation, we will order urinalysis and refer him to urology.  Dizziness   He has experienced intermittent dizziness for the past few months, worsening when looking up. We discussed vestibular issues as a potential cause and the need for further imaging. We will order a CT scan.  Chronic Fatigue   He has had persistent fatigue and weakness since a COVID-19 infection in 2020, struggling with climbing ladders and physical tasks. After evaluations by cardiology and pulmonology suggested chronic post-COVID syndrome, we discussed the indefinite persistence of symptoms. We will order blood work and encourage continued physical activity as tolerated.  General Health Maintenance   He reports a good appetite and attempts to exercise. We discussed the importance of continued physical activity and managing symptoms, providing anticipatory guidance on maintaining health. We encourage continued physical activity as tolerated.  Follow-up   We will follow up with cardiology in June and send a message to the referral person for a urology consultation.        Return if symptoms worsen or fail to improve.    Donato Schultz, DO

## 2023-06-26 LAB — URINE CULTURE
MICRO NUMBER:: 15894378
Result:: NO GROWTH
SPECIMEN QUALITY:: ADEQUATE

## 2023-06-27 ENCOUNTER — Other Ambulatory Visit: Payer: Self-pay | Admitting: Family Medicine

## 2023-06-27 DIAGNOSIS — M25551 Pain in right hip: Secondary | ICD-10-CM

## 2023-06-28 ENCOUNTER — Telehealth: Payer: Self-pay

## 2023-06-28 NOTE — Telephone Encounter (Signed)
Copied from CRM [id number]. Topic: Clinical - Request for Lab/Test Order >> Jun 28, 2023  9:55 AM Truddie Crumble wrote: Reason for CRM: pt called stating he was told to make an appointment for a hip xray through [facility]

## 2023-06-29 ENCOUNTER — Ambulatory Visit (HOSPITAL_BASED_OUTPATIENT_CLINIC_OR_DEPARTMENT_OTHER)
Admission: RE | Admit: 2023-06-29 | Discharge: 2023-06-29 | Disposition: A | Discharge status: Home / Self Care | Payer: Medicare Other | Source: Ambulatory Visit | Attending: Family Medicine | Admitting: Family Medicine

## 2023-06-29 ENCOUNTER — Other Ambulatory Visit: Payer: Self-pay

## 2023-06-29 ENCOUNTER — Encounter (HOSPITAL_BASED_OUTPATIENT_CLINIC_OR_DEPARTMENT_OTHER): Payer: Self-pay

## 2023-06-29 DIAGNOSIS — R1030 Lower abdominal pain, unspecified: Secondary | ICD-10-CM | POA: Insufficient documentation

## 2023-06-29 DIAGNOSIS — M25551 Pain in right hip: Secondary | ICD-10-CM | POA: Insufficient documentation

## 2023-06-29 DIAGNOSIS — M47816 Spondylosis without myelopathy or radiculopathy, lumbar region: Secondary | ICD-10-CM | POA: Diagnosis not present

## 2023-06-29 NOTE — Telephone Encounter (Signed)
Spoke with patient. Pt states calling imaging and unable to speak with anyone and kept getting a voicemail. Pt asked for me to call imaging. Was able to speak with imaging and they will reach out the patient

## 2023-07-09 ENCOUNTER — Ambulatory Visit: Payer: Self-pay | Admitting: Surgery

## 2023-07-09 ENCOUNTER — Telehealth: Payer: Self-pay

## 2023-07-09 DIAGNOSIS — R1031 Right lower quadrant pain: Secondary | ICD-10-CM | POA: Diagnosis not present

## 2023-07-09 NOTE — Telephone Encounter (Signed)
   Pre-operative Risk Assessment    Patient Name: Anthony King  DOB: 02-10-1951 MRN: [id number]   Date of last office visit: 06/02/23 Date of next office visit: Not scheduled   Request for Surgical Clearance    Procedure:   Hernia Surgery  Date of Surgery:  Clearance TBD                                Surgeon:  Debby Shipper, MD Surgeon's Group or Practice Name:  [facility] Phone number:  [phone] Fax number:  [phone]   Type of Clearance Requested:   - Medical    Type of Anesthesia:  General    Additional requests/questions:    Bonney Ival LOISE Gerome   07/09/2023, 11:49 AM

## 2023-07-13 NOTE — Telephone Encounter (Signed)
 Okay for surgery with cardiology clearance

## 2023-07-14 NOTE — Telephone Encounter (Signed)
   Patient Name: Anthony King  DOB: 1950-11-29 MRN: [id number]  Primary Cardiologist: Antoinette Batman, MD  Chart reviewed as part of pre-operative protocol coverage. Given past medical history and time since last visit, based on ACC/AHA guidelines, Anthony King is at acceptable risk for the planned procedure without further cardiovascular testing.   The patient was advised that if he develops new symptoms prior to surgery to contact our office to arrange for a follow-up visit, and he verbalized understanding.  I will route this recommendation to the requesting party via Epic fax function and remove from pre-op pool.  Please call with questions.  Friddie Jetty, NP 07/14/2023, 7:25 AM

## 2023-08-23 NOTE — Progress Notes (Signed)
 Surgical Instructions   Your procedure is scheduled on Tuesday, March 4th, 2025. Report to [facility] Main Entrance "A" at 11:00 A.M., then check in with the Admitting office. Any questions or running late day of surgery: call [phone]  Questions prior to your surgery date: call [phone], Monday-Friday, 8am-4pm. If you experience any cold or flu symptoms such as cough, fever, chills, shortness of breath, etc. between now and your scheduled surgery, please notify us at the above number.     Remember:  Do not eat after midnight the night before your surgery   You may drink clear liquids until 10:00 the morning of your surgery.   Clear liquids allowed are: Water, Non-Citrus Juices (without pulp), Carbonated Beverages, Clear Tea (no milk, honey, etc.), Black Coffee Only (NO MILK, CREAM OR POWDERED CREAMER of any kind), and Gatorade.    Take these medicines the morning of surgery with A SIP OF WATER: Lansoprazole (Prevacid) Rosuvastatin (Crestor)   May take these medicines IF NEEDED: None.    One week prior to surgery, STOP taking any Aspirin (unless otherwise instructed by your surgeon) Aleve, Naproxen, Ibuprofen, Motrin, Advil, Goody's, BC's, all herbal medications, fish oil, and non-prescription vitamins.                     Do NOT Smoke (Tobacco/Vaping) for 24 hours prior to your procedure.  If you use a CPAP at night, you may bring your mask/headgear for your overnight stay.   You will be asked to remove any contacts, glasses, piercing's, hearing aid's, dentures/partials prior to surgery. Please bring cases for these items if needed.    Patients discharged the day of surgery will not be allowed to drive home, and someone needs to stay with them for 24 hours.  SURGICAL WAITING ROOM VISITATION Patients may have no more than 2 support people in the waiting area - these visitors may rotate.   Pre-op nurse will coordinate an appropriate time for 1 ADULT support person, who  may not rotate, to accompany patient in pre-op.  Children under the age of 68 must have an adult with them who is not the patient and must remain in the main waiting area with an adult.  If the patient needs to stay at the hospital during part of their recovery, the visitor guidelines for inpatient rooms apply.  Please refer to the [facility] website for the visitor guidelines for any additional information.   If you received a COVID test during your pre-op visit  it is requested that you wear a mask when out in public, stay away from anyone that may not be feeling well and notify your surgeon if you develop symptoms. If you have been in contact with anyone that has tested positive in the last 10 days please notify you surgeon.      Pre-operative CHG Bathing Instructions   You can play a key role in reducing the risk of infection after surgery. Your skin needs to be as free of germs as possible. You can reduce the number of germs on your skin by washing with CHG (chlorhexidine gluconate) soap before surgery. CHG is an antiseptic soap that kills germs and continues to kill germs even after washing.   DO NOT use if you have an allergy to chlorhexidine/CHG or antibacterial soaps. If your skin becomes reddened or irritated, stop using the CHG and notify one of our RNs at [phone].  TAKE A SHOWER THE NIGHT BEFORE SURGERY AND THE DAY OF SURGERY    Please keep in mind the following:  DO NOT shave, including legs and underarms, 48 hours prior to surgery.   You may shave your face before/day of surgery.  Place clean sheets on your bed the night before surgery Use a clean washcloth (not used since being washed) for each shower. DO NOT sleep with pet's night before surgery.  CHG Shower Instructions:  Wash your face and private area with normal soap. If you choose to wash your hair, wash first with your normal shampoo.  After you use shampoo/soap, rinse your hair and body  thoroughly to remove shampoo/soap residue.  Turn the water OFF and apply half the bottle of CHG soap to a CLEAN washcloth.  Apply CHG soap ONLY FROM YOUR NECK DOWN TO YOUR TOES (washing for 3-5 minutes)  DO NOT use CHG soap on face, private areas, open wounds, or sores.  Pay special attention to the area where your surgery is being performed.  If you are having back surgery, having someone wash your back for you may be helpful. Wait 2 minutes after CHG soap is applied, then you may rinse off the CHG soap.  Pat dry with a clean towel  Put on clean pajamas    Additional instructions for the day of surgery: DO NOT APPLY any lotions, deodorants, cologne, or perfumes.   Do not wear jewelry or makeup Do not wear nail polish, gel polish, artificial nails, or any other type of covering on natural nails (fingers and toes) Do not bring valuables to the hospital. [facility] is not responsible for valuables/personal belongings. Put on clean/comfortable clothes.  Please brush your teeth.  Ask your nurse before applying any prescription medications to the skin.

## 2023-08-24 ENCOUNTER — Encounter (HOSPITAL_COMMUNITY): Payer: Self-pay

## 2023-08-24 ENCOUNTER — Encounter (HOSPITAL_COMMUNITY)
Admission: RE | Admit: 2023-08-24 | Discharge: 2023-08-24 | Disposition: A | Discharge status: Home / Self Care | Payer: Medicare Other | Source: Ambulatory Visit | Attending: Surgery | Admitting: Surgery

## 2023-08-24 ENCOUNTER — Other Ambulatory Visit: Payer: Self-pay

## 2023-08-24 VITALS — BP 118/82 | HR 66 | Temp 97.7°F | Resp 17 | Ht 70.0 in | Wt 219.0 lb

## 2023-08-24 DIAGNOSIS — G4733 Obstructive sleep apnea (adult) (pediatric): Secondary | ICD-10-CM | POA: Diagnosis not present

## 2023-08-24 DIAGNOSIS — Z87891 Personal history of nicotine dependence: Secondary | ICD-10-CM | POA: Insufficient documentation

## 2023-08-24 DIAGNOSIS — I700 Atherosclerosis of aorta: Secondary | ICD-10-CM | POA: Diagnosis not present

## 2023-08-24 DIAGNOSIS — I2510 Atherosclerotic heart disease of native coronary artery without angina pectoris: Secondary | ICD-10-CM | POA: Diagnosis not present

## 2023-08-24 DIAGNOSIS — Z01812 Encounter for preprocedural laboratory examination: Secondary | ICD-10-CM | POA: Insufficient documentation

## 2023-08-24 DIAGNOSIS — Z01818 Encounter for other preprocedural examination: Secondary | ICD-10-CM

## 2023-08-24 LAB — BASIC METABOLIC PANEL
Anion gap: 7 (ref 5–15)
BUN: 17 mg/dL (ref 8–23)
CO2: 24 mmol/L (ref 22–32)
Calcium: 9 mg/dL (ref 8.9–10.3)
Chloride: 106 mmol/L (ref 98–111)
Creatinine, Ser: 1.42 mg/dL — ABNORMAL HIGH (ref 0.61–1.24)
GFR, Estimated: 53 mL/min — ABNORMAL LOW (ref >60)
Glucose, Bld: 107 mg/dL — ABNORMAL HIGH (ref 70–99)
Potassium: 4.3 mmol/L (ref 3.5–5.1)
Sodium: 137 mmol/L (ref 135–145)

## 2023-08-24 LAB — CBC
HCT: 48.2 % (ref 39.0–52.0)
Hemoglobin: 16.1 g/dL (ref 13.0–17.0)
MCH: 27.6 pg (ref 26.0–34.0)
MCHC: 33.4 g/dL (ref 30.0–36.0)
MCV: 82.7 fL (ref 80.0–100.0)
Platelets: 162 10*3/uL (ref 150–400)
RBC: 5.83 MIL/uL — ABNORMAL HIGH (ref 4.22–5.81)
RDW: 13.6 % (ref 11.5–15.5)
WBC: 7.7 10*3/uL (ref 4.0–10.5)
nRBC: 0 % (ref 0.0–0.2)

## 2023-08-24 NOTE — Progress Notes (Signed)
 PCP - Dr. Loreen Freud [place] Cardiologist - Dr. Nicki Guadalajara  PPM/ICD - denies   Chest x-ray - 02/11/23 EKG - 06/02/23 Stress Test - 08/20/21 ECHO - 03/03/23 Cardiac Cath - 03/21/18  Sleep Study - OSA+ CPAP - denies, unable to tolerate  DM- pre-diabetic  Last dose of GLP1 agonist-  n/a   ASA/Blood Thinner Instructions: n/a   ERAS Protcol - clears until 1000   COVID TEST- n/a   Anesthesia review: yes, cardiac hx  Patient denies shortness of breath, fever, cough and chest pain at PAT appointment   All instructions explained to the patient, with a verbal understanding of the material. Patient agrees to go over the instructions while at home for a better understanding.  The opportunity to ask questions was provided.

## 2023-08-24 NOTE — Progress Notes (Signed)
 Surgical Instructions   Your procedure is scheduled on Tuesday, March 4th, 2025. Report to [facility] Main Entrance "A" at 11:00 A.M., then check in with the Admitting office. Any questions or running late day of surgery: call [phone]  Questions prior to your surgery date: call [phone], Monday-Friday, 8am-4pm. If you experience any cold or flu symptoms such as cough, fever, chills, shortness of breath, etc. between now and your scheduled surgery, please notify us at the above number.     Remember:  Do not eat after midnight the night before your surgery   You may drink clear liquids until 10:00 the morning of your surgery.   Clear liquids allowed are: Water, Non-Citrus Juices (without pulp), Carbonated Beverages, Clear Tea (no milk, honey, etc.), Black Coffee Only (NO MILK, CREAM OR POWDERED CREAMER of any kind), and Gatorade.    Take these medicines the morning of surgery with A SIP OF WATER: Lansoprazole (Prevacid) Rosuvastatin (Crestor)   May take these medicines IF NEEDED:  gabapentin (NEURONTIN)    One week prior to surgery, STOP taking any Aspirin (unless otherwise instructed by your surgeon) Aleve, Naproxen, Ibuprofen, Motrin, Advil, Goody's, BC's, all herbal medications, fish oil, and non-prescription vitamins.                     Do NOT Smoke (Tobacco/Vaping) for 24 hours prior to your procedure.  If you use a CPAP at night, you may bring your mask/headgear for your overnight stay.   You will be asked to remove any contacts, glasses, piercing's, hearing aid's, dentures/partials prior to surgery. Please bring cases for these items if needed.    Patients discharged the day of surgery will not be allowed to drive home, and someone needs to stay with them for 24 hours.  SURGICAL WAITING ROOM VISITATION Patients may have no more than 2 support people in the waiting area - these visitors may rotate.   Pre-op nurse will coordinate an appropriate time for 1 ADULT  support person, who may not rotate, to accompany patient in pre-op.  Children under the age of 30 must have an adult with them who is not the patient and must remain in the main waiting area with an adult.  If the patient needs to stay at the hospital during part of their recovery, the visitor guidelines for inpatient rooms apply.  Please refer to the [facility] website for the visitor guidelines for any additional information.   If you received a COVID test during your pre-op visit  it is requested that you wear a mask when out in public, stay away from anyone that may not be feeling well and notify your surgeon if you develop symptoms. If you have been in contact with anyone that has tested positive in the last 10 days please notify you surgeon.      Pre-operative CHG Bathing Instructions   You can play a key role in reducing the risk of infection after surgery. Your skin needs to be as free of germs as possible. You can reduce the number of germs on your skin by washing with CHG (chlorhexidine gluconate) soap before surgery. CHG is an antiseptic soap that kills germs and continues to kill germs even after washing.   DO NOT use if you have an allergy to chlorhexidine/CHG or antibacterial soaps. If your skin becomes reddened or irritated, stop using the CHG and notify one of our RNs at [phone].  TAKE A SHOWER THE NIGHT BEFORE SURGERY AND THE DAY OF SURGERY    Please keep in mind the following:  DO NOT shave, including legs and underarms, 48 hours prior to surgery.   You may shave your face before/day of surgery.  Place clean sheets on your bed the night before surgery Use a clean washcloth (not used since being washed) for each shower. DO NOT sleep with pet's night before surgery.  CHG Shower Instructions:  Wash your face and private area with normal soap. If you choose to wash your hair, wash first with your normal shampoo.  After you use shampoo/soap, rinse your  hair and body thoroughly to remove shampoo/soap residue.  Turn the water OFF and apply half the bottle of CHG soap to a CLEAN washcloth.  Apply CHG soap ONLY FROM YOUR NECK DOWN TO YOUR TOES (washing for 3-5 minutes)  DO NOT use CHG soap on face, private areas, open wounds, or sores.  Pay special attention to the area where your surgery is being performed.  If you are having back surgery, having someone wash your back for you may be helpful. Wait 2 minutes after CHG soap is applied, then you may rinse off the CHG soap.  Pat dry with a clean towel  Put on clean pajamas    Additional instructions for the day of surgery: DO NOT APPLY any lotions, deodorants, cologne, or perfumes.   Do not wear jewelry or makeup Do not wear nail polish, gel polish, artificial nails, or any other type of covering on natural nails (fingers and toes) Do not bring valuables to the hospital. [facility] is not responsible for valuables/personal belongings. Put on clean/comfortable clothes.  Please brush your teeth.  Ask your nurse before applying any prescription medications to the skin.

## 2023-08-25 NOTE — Progress Notes (Addendum)
 Anesthesia Chart Review:  73 year old male follows with cardiology for history of nonobstructive CAD.  Echo 02/2023 showed EF 55 to 60%, grade 1 DD, normal RV function, moderate aortic regurgitation.  Coronary CTA 03/2023 showed minimal nonobstructive CAD. Clearance by Joni Reining, NP 07/14/23, "Chart reviewed as part of pre-operative protocol coverage. Given past medical history and time since last visit, based on ACC/AHA guidelines, Anthony King is at acceptable risk for the planned procedure without further cardiovascular testing. The patient was advised that if he develops new symptoms prior to surgery to contact our office to arrange for a follow-up visit, and he verbalized understanding."  Home sleep test 03/2021 showed severe OSA, patient not currently using CPAP.  Other pertinent history includes GERD, gastric ulcer, former smoker (quit 1993).  Preop labs reviewed, creatinine mildly elevated 1.42, otherwise unremarkable.  EKG 06/02/2023: Normal sinus rhythm.  Rate 72. Nonspecific T wave abnormality  Coronary CTA 04/27/2023: IMPRESSION: 1. Coronary calcium score of 16.8. This was 19th percentile for age-, sex, and race-matched controls.   2. Total plaque volume 41 mm3 which is 4th percentile for age- and sex-matched controls (calcified plaque 1 mm3; non-calcified plaque 40 mm3). TPV is mild.   3. Normal coronary origin with co-dominance.   4. Minimal mixed density plaque (<25%) in the left main and LAD.   5. Moderately dilated aortic root up to 43 mm.   RECOMMENDATIONS: 1. CAD-RADS 1: Minimal non-obstructive CAD (0-24%). Consider non-atherosclerotic causes of chest pain. Consider preventive therapy and risk factor modification.  TTE 03/03/2023: 1. Left ventricular ejection fraction, by estimation, is 55 to 60%. Left  ventricular ejection fraction by 3D volume is 59 %. The left ventricle has  normal function. The left ventricle has no regional wall motion   abnormalities. Left ventricular diastolic   parameters are consistent with Grade I diastolic dysfunction (impaired  relaxation).   2. Right ventricular systolic function is normal. The right ventricular  size is normal. There is normal pulmonary artery systolic pressure. The  estimated right ventricular systolic pressure is 21.3 mmHg.   3. The mitral valve is grossly normal. Trivial mitral valve  regurgitation.   4. The aortic valve is tricuspid. Aortic valve regurgitation is moderate.   5. Aortic dilatation noted. There is mild dilatation of the aortic root,  measuring 40 mm. There is borderline dilatation of the ascending aorta,  measuring 39 mm.   6. The inferior vena cava is normal in size with greater than 50%  respiratory variability, suggesting right atrial pressure of 3 mmHg.   Comparison(s): Changes from prior study are noted. 01/12/2020: LVEF 60-65%,  mild to moderate AI, aorta dilated to 37 mm     Zannie Cove [facility]/Anesthesiology Phone [phone] 08/25/2023 12:56 PM

## 2023-08-25 NOTE — Anesthesia Preprocedure Evaluation (Addendum)
 Anesthesia Evaluation  Patient identified by MRN, date of birth, ID band Patient awake    Reviewed: Allergy & Precautions, NPO status , Patient's Chart, lab work & pertinent test results, reviewed documented beta blocker date and time   Airway Mallampati: II  TM Distance: >3 FB     Dental  (+) Partial Lower, Missing, Dental Advisory Given   Pulmonary shortness of breath, pneumonia, resolved, former smoker   Pulmonary exam normal breath sounds clear to auscultation       Cardiovascular + angina  + CAD and + DOE  Normal cardiovascular exam Rhythm:Regular Rate:Normal  EKG 06/02/23 NSR, non specific T wave changes anteroseptal  Echo 03/03/23 1. Left ventricular ejection fraction, by estimation, is 55 to 60%. Left  ventricular ejection fraction by 3D volume is 59 %. The left ventricle has  normal function. The left ventricle has no regional wall motion  abnormalities. Left ventricular diastolic   parameters are consistent with Grade I diastolic dysfunction (impaired  relaxation).   2. Right ventricular systolic function is normal. The right ventricular  size is normal. There is normal pulmonary artery systolic pressure. The  estimated right ventricular systolic pressure is 21.3 mmHg.   3. The mitral valve is grossly normal. Trivial mitral valve  regurgitation.   4. The aortic valve is tricuspid. Aortic valve regurgitation is moderate.   5. Aortic dilatation noted. There is mild dilatation of the aortic root,  measuring 40 mm. There is borderline dilatation of the ascending aorta,  measuring 39 mm.   6. The inferior vena cava is normal in size with greater than 50%  respiratory variability, suggesting right atrial pressure of 3 mmHg.   Cath 03/21/18 40% D2   Neuro/Psych  Headaches PSYCHIATRIC DISORDERS  Depression       GI/Hepatic PUD,GERD  Medicated,,  Endo/Other  Pre diabetes  Hyperlipidemia  Renal/GU Renal  InsufficiencyRenal diseaseHx/o renal calculi   Frequent urination    Musculoskeletal  (+) Arthritis , Osteoarthritis,  Right groin pain Hx/o RIH repair with mesh   Abdominal  (+) + obese  Peds  Hematology negative hematology ROS (+)   Anesthesia Other Findings   Reproductive/Obstetrics                              Anesthesia Physical Anesthesia Plan  ASA: 2  Anesthesia Plan: General   Post-op Pain Management: Dilaudid IV, Regional block* and Minimal or no pain anticipated   Induction: Intravenous  PONV Risk Score and Plan: 4 or greater and Treatment may vary due to age or medical condition and Dexamethasone  Airway Management Planned: Oral ETT  Additional Equipment: None  Intra-op Plan:   Post-operative Plan: Extubation in OR  Informed Consent: I have reviewed the patients History and Physical, chart, labs and discussed the procedure including the risks, benefits and alternatives for the proposed anesthesia with the patient or authorized representative who has indicated his/her understanding and acceptance.     Dental advisory given  Plan Discussed with: CRNA and Anesthesiologist  Anesthesia Plan Comments: (PAT note by Antionette Poles, PA-C: 73 year old male follows with cardiology for history of nonobstructive CAD.  Echo 02/2023 showed EF 55 to 60%, grade 1 DD, normal RV function, moderate aortic regurgitation.  Coronary CTA 03/2023 showed minimal nonobstructive CAD. Clearance by Joni Reining, NP 07/14/23, "Chart reviewed as part of pre-operative protocol coverage. Given past medical history and time since last visit, based on ACC/AHA guidelines, Anthony King  is at acceptable risk for the planned procedure without further cardiovascular testing. The patient was advised that if he develops new symptoms prior to surgery to contact our office to arrange for a follow-up visit, and he verbalized understanding."  Home sleep test 03/2021  showed severe OSA, patient not currently using CPAP.  Other pertinent history includes GERD, gastric ulcer, former smoker (quit 1993).  Preop labs reviewed, creatinine mildly elevated 1.42, otherwise unremarkable.  EKG 06/02/2023: Normal sinus rhythm.  Rate 72. Nonspecific T wave abnormality  Coronary CTA 04/27/2023: IMPRESSION: 1. Coronary calcium score of 16.8. This was 19th percentile for age-, sex, and race-matched controls.   2. Total plaque volume 41 mm3 which is 4th percentile for age- and sex-matched controls (calcified plaque 1 mm3; non-calcified plaque 40 mm3). TPV is mild.   3. Normal coronary origin with co-dominance.   4. Minimal mixed density plaque (<25%) in the left main and LAD.   5. Moderately dilated aortic root up to 43 mm.   RECOMMENDATIONS: 1. CAD-RADS 1: Minimal non-obstructive CAD (0-24%). Consider non-atherosclerotic causes of chest pain. Consider preventive therapy and risk factor modification.  TTE 03/03/2023: 1. Left ventricular ejection fraction, by estimation, is 55 to 60%. Left  ventricular ejection fraction by 3D volume is 59 %. The left ventricle has  normal function. The left ventricle has no regional wall motion  abnormalities. Left ventricular diastolic   parameters are consistent with Grade I diastolic dysfunction (impaired  relaxation).   2. Right ventricular systolic function is normal. The right ventricular  size is normal. There is normal pulmonary artery systolic pressure. The  estimated right ventricular systolic pressure is 21.3 mmHg.   3. The mitral valve is grossly normal. Trivial mitral valve  regurgitation.   4. The aortic valve is tricuspid. Aortic valve regurgitation is moderate.   5. Aortic dilatation noted. There is mild dilatation of the aortic root,  measuring 40 mm. There is borderline dilatation of the ascending aorta,  measuring 39 mm.   6. The inferior vena cava is normal in size with greater than 50%  respiratory  variability, suggesting right atrial pressure of 3 mmHg.   Comparison(s): Changes from prior study are noted. 01/12/2020: LVEF 60-65%,  mild to moderate AI, aorta dilated to 37 mm    )         Anesthesia Quick Evaluation

## 2023-08-26 ENCOUNTER — Encounter: Payer: Self-pay | Admitting: Pulmonary Disease

## 2023-08-30 NOTE — H&P (Signed)
 istory of Present Illness: Anthony King is a 73 y.o. male who is seen today as an office consultation for evaluation of NEW PROBLEM (RIGHT GROIN PAIN)  Patient presents for evaluation of right groin pain. He has a complex history of a laparoscopic bilateral inguinal hernia in 2015. This was followed by surgery in 2020 for bilateral inguinal hernia. In 2021 he is having chronic pain and underwent diagnostic laparoscopy with right groin neurectomy with explantation of old mesh and repair. He did well after that until about 6 months ago when he developed severe right groin pain. Made worse with activity. He has been evaluated by his PCP who obtained a CT scan which was read as normal. I reviewed the images myself and noted bulging in his right groin. No change to bowel or bladder function. No change to urination. No blood in his urine or stool.  Review of Systems: A complete review of systems was obtained from the patient. I have reviewed this information and discussed as appropriate with the patient. See HPI as well for other ROS.    Medical History: Past Medical History:  Diagnosis Date  Arthritis   Patient Active Problem List  Diagnosis  Aortic atherosclerosis (CMS-HCC)  Bronchitis  CAD (coronary artery disease), native coronary artery  Chest pain  Chronic bronchitis with productive mucopurulent cough (CMS/HHS-HCC)  CKD (chronic kidney disease), stage III (CMS/HHS-HCC)  CRF (chronic renal failure), stage 3 (moderate) (CMS/HHS-HCC)  Depression, major, single episode, moderate (CMS/HHS-HCC)  DOE (dyspnea on exertion)  Esophageal reflux  Frequent urination  Hyperlipidemia  Hypoxia  Left flank mass  Left lower quadrant abdominal pain  Mass of chest wall, right  Muscle cramps  Other fatigue  Pain in right hand  Personal history of colonic polyps  Pneumonia due to COVID-19 virus  Post-COVID chronic decreased mobility and endurance  Post-COVID chronic dyspnea  Prediabetes   Unstable angina (CMS/HHS-HCC)   Past Surgical History:  Procedure Laterality Date  HEMORRHOIDECTOMY INTERNAL & EXTERNAL N/A 05/15/2022    Allergies  Allergen Reactions  Aspirin Other (See Comments)  REACTION: nauseated, drooling   Current Outpatient Medications on File Prior to Visit  Medication Sig Dispense Refill  cyanocobalamin (VITAMIN B12) 1000 MCG tablet Take 1,000 mcg by mouth once daily  FUROsemide (LASIX) 20 MG tablet Take 20 mg by mouth once daily  lansoprazole (PREVACID) 15 MG DR capsule Take 15 mg by mouth  rosuvastatin (CRESTOR) 20 MG tablet Take 20 mg by mouth once daily   No current facility-administered medications on file prior to visit.   Family History  Problem Relation Age of Onset  Breast cancer Mother    Social History   Tobacco Use  Smoking Status Former  Types: Cigarettes  Smokeless Tobacco Never    Social History   Socioeconomic History  Marital status: Married  Tobacco Use  Smoking status: Former  Types: Cigarettes  Smokeless tobacco: Never  Vaping Use  Vaping status: Never Used  Substance and Sexual Activity  Alcohol use: Yes  Drug use: Never   Social Drivers of Architectural technologist Insecurity: No Food Insecurity (08/30/2020)  Received from [facility] [place]  Hunger Vital Sign  Worried About Running Out of Food in the Last Year: Never true  Ran Out of Food in the Last Year: Never true  Transportation Needs: No Transportation Needs (08/30/2020)  Received from [facility] [place]  PRAPARE - Transportation  Lack of Transportation (Medical): No  Lack of Transportation (Non-Medical): No  Housing Stability:  Unknown (07/09/2023)  Housing Stability Vital Sign  Homeless in the Last Year: No   Objective:   Vitals:  07/09/23 0927  BP: 132/83  Pulse: 58  Temp: 36.3 C (97.3 F)  SpO2: 99%  Weight: 99.6 kg (219 lb 9.6 oz)  Height: 177.8 cm (5\' 10" )  PainSc: 6   Body mass index is 31.51 kg/m.  Physical Exam Vitals  reviewed.  HENT:  Head: Normocephalic.  Cardiovascular:  Rate and Rhythm: Normal rate.  Pulmonary:  Effort: Pulmonary effort is normal.  Abdominal:   Comments: Fullness noted right groin. Bulging with Valsalva. Tender to palpation at the external ring. Left groin normal on exam. Abdomen otherwise soft nontender without rebound or guarding.  Neurological:  General: No focal deficit present.  Mental Status: He is alert.  Psychiatric:  Mood and Affect: Mood normal.     Labs, Imaging and Diagnostic Testing:  CLINICAL DATA: Right lower quadrant pain  EXAM: CT ABDOMEN AND PELVIS WITH CONTRAST  TECHNIQUE: Multidetector CT imaging of the abdomen and pelvis was performed using the standard protocol following bolus administration of intravenous contrast.  RADIATION DOSE REDUCTION: This exam was performed according to the departmental dose-optimization program which includes automated exposure control, adjustment of the mA and/or kV according to patient size and/or use of iterative reconstruction technique.  CONTRAST: OMNIPAQUE IOHEXOL 300 MG/ML SOLN  COMPARISON: 09/11/2019 and previous  FINDINGS: Lower chest: No pleural or pericardial effusion. Visualized lung bases clear.  Hepatobiliary: 2.8 cm lamellated gallstone in the nondilated gallbladder. No focal liver lesion or biliary ductal dilatation.  Pancreas: Unremarkable. No pancreatic ductal dilatation or surrounding inflammatory changes.  Spleen: Normal in size without focal abnormality.  Adrenals/Urinary Tract: No adrenal mass. Symmetric renal parenchymal enhancement without urolithiasis or hydronephrosis. Urinary bladder nondistended.  Stomach/Bowel: Stomach relatively decompressed, without acute finding. Small bowel decompressed. Post appendectomy. The colon is partially distended, with scattered diverticula most numerous in the sigmoid segment; no adjacent inflammatory change  Vascular/Lymphatic: Mild  scattered calcified aortoiliac plaque without aneurysm. Portal vein patent. No abdominal or pelvic adenopathy.  Reproductive: Prostate is unremarkable.  Other: No ascites. No free air.  Musculoskeletal: Mild lumbar degenerative disc disease L4-S1. Mild left hip DJD.  IMPRESSION: 1. No acute findings. 2. Cholelithiasis. 3. Colonic diverticulosis. 4. Aortic Atherosclerosis (ICD10-I70.0).   Electronically Signed By: Corlis Leak M.D. On: 06/25/2023 16:10 Assessment and Plan:   Diagnoses and all orders for this visit:  Right groin pain - temazepam (RESTORIL) 7.5 MG capsule; Take 1 capsule (7.5 mg total) by mouth at bedtime as needed for Sleep for up to 30 days  Other orders - gabapentin (NEURONTIN) 100 MG capsule; Take 1 capsule (100 mg total) by mouth 2 (two) times daily as needed   Complex patient with multiple inguinal hernia repairs both laparoscopic and open since 2015. He has developed more severe right groin pain. He had pain back in 2021 that was addressed by laparoscopy and groin exploration. He underwent neurectomy which resolved that pain at that time. He has developed more right groin pain over the last 6 months. A CT scan was performed which has not shown a hernia. I reviewed the films myself and there is significant change in the area and bulging of that area. I suspect he is got a recurrent hernia and/or is developed issues with the mass causing compression. He has significant pain which is new for him. He stated he felt great after last operation to about 6 months ago when the symptoms started in  his right groin. The pain does radiate toward his back as well. On exam, he does have tenderness and fullness in the right groin which is different from previous examinations.  I have discussed laparoscopy with possible groin exploration as well. I think he might need to have this revised and/or mesh removed. I think laparoscopy would be helpful though to get a better view of the  inside and then determine at groin exploration if we need to explant his old mesh and repair this with sutures and/or use a different product. I explained chronic pain will be an issue no matter what is done surgically but certainly this is a significant change for him and is limiting his quality of life. I did explain this may not help his pain nor will we necessarily find anything its correctable surgically but after his exam and reviewing his CT scan, this is a significant change from the last time when I saw him. He is in agreement to proceed. Risk of bleeding, DVT,infection, recurrent hernia, injury to neighboring structures, injury to internal organs, cardiovascular risk, exacerbation of underlying medical problems and needed further treatments and/or procedures.   Hayden Rasmussen, MD

## 2023-08-31 ENCOUNTER — Other Ambulatory Visit: Payer: Self-pay

## 2023-08-31 ENCOUNTER — Encounter (HOSPITAL_COMMUNITY): Payer: Self-pay | Admitting: Surgery

## 2023-08-31 ENCOUNTER — Encounter (HOSPITAL_COMMUNITY)
Admission: RE | Disposition: A | Discharge status: Home / Self Care | Payer: Self-pay | Source: Home / Self Care | Attending: Surgery

## 2023-08-31 ENCOUNTER — Ambulatory Visit (HOSPITAL_COMMUNITY): Payer: Self-pay | Admitting: Physician Assistant

## 2023-08-31 ENCOUNTER — Ambulatory Visit (HOSPITAL_COMMUNITY): Admitting: Anesthesiology

## 2023-08-31 ENCOUNTER — Ambulatory Visit (HOSPITAL_COMMUNITY)
Admission: RE | Admit: 2023-08-31 | Discharge: 2023-08-31 | Disposition: A | Discharge status: Home / Self Care | Payer: Medicare Other | Attending: Surgery | Admitting: Surgery

## 2023-08-31 DIAGNOSIS — K4090 Unilateral inguinal hernia, without obstruction or gangrene, not specified as recurrent: Secondary | ICD-10-CM

## 2023-08-31 DIAGNOSIS — R1030 Lower abdominal pain, unspecified: Secondary | ICD-10-CM | POA: Diagnosis not present

## 2023-08-31 DIAGNOSIS — Z8711 Personal history of peptic ulcer disease: Secondary | ICD-10-CM | POA: Insufficient documentation

## 2023-08-31 DIAGNOSIS — Z87891 Personal history of nicotine dependence: Secondary | ICD-10-CM | POA: Diagnosis not present

## 2023-08-31 DIAGNOSIS — G8918 Other acute postprocedural pain: Secondary | ICD-10-CM | POA: Diagnosis not present

## 2023-08-31 DIAGNOSIS — N183 Chronic kidney disease, stage 3 unspecified: Secondary | ICD-10-CM | POA: Insufficient documentation

## 2023-08-31 DIAGNOSIS — I25119 Atherosclerotic heart disease of native coronary artery with unspecified angina pectoris: Secondary | ICD-10-CM | POA: Diagnosis not present

## 2023-08-31 DIAGNOSIS — E785 Hyperlipidemia, unspecified: Secondary | ICD-10-CM | POA: Insufficient documentation

## 2023-08-31 DIAGNOSIS — K219 Gastro-esophageal reflux disease without esophagitis: Secondary | ICD-10-CM | POA: Diagnosis not present

## 2023-08-31 DIAGNOSIS — R1031 Right lower quadrant pain: Secondary | ICD-10-CM | POA: Diagnosis not present

## 2023-08-31 DIAGNOSIS — R7303 Prediabetes: Secondary | ICD-10-CM | POA: Insufficient documentation

## 2023-08-31 DIAGNOSIS — K4091 Unilateral inguinal hernia, without obstruction or gangrene, recurrent: Secondary | ICD-10-CM | POA: Insufficient documentation

## 2023-08-31 HISTORY — PX: LAPAROSCOPY: SHX197

## 2023-08-31 HISTORY — PX: INSERTION OF MESH: SHX5868

## 2023-08-31 HISTORY — PX: INGUINAL HERNIA REPAIR: SHX194

## 2023-08-31 HISTORY — PX: GROIN DISSECTION: SHX5250

## 2023-08-31 SURGERY — LAPAROSCOPY, DIAGNOSTIC
Anesthesia: General | Site: Groin | Laterality: Right

## 2023-08-31 MED ORDER — OXYCODONE HCL 5 MG PO TABS
5.0000 mg | ORAL_TABLET | Freq: Once | ORAL | Status: AC | PRN
  Administered 2023-08-31: 5 mg via ORAL

## 2023-08-31 MED ORDER — BUPIVACAINE LIPOSOME 1.3 % IJ SUSP
INTRAMUSCULAR | Status: DC | PRN
  Administered 2023-08-31: 10 mL via PERINEURAL

## 2023-08-31 MED ORDER — DEXAMETHASONE SODIUM PHOSPHATE 10 MG/ML IJ SOLN
INTRAMUSCULAR | Status: AC
  Filled 2023-08-31: qty 1

## 2023-08-31 MED ORDER — BUPIVACAINE HCL (PF) 0.5 % IJ SOLN
INTRAMUSCULAR | Status: DC | PRN
  Administered 2023-08-31: 15 mL via PERINEURAL

## 2023-08-31 MED ORDER — SODIUM CHLORIDE 0.9 % IR SOLN
Status: DC | PRN
  Administered 2023-08-31: 1000 mL

## 2023-08-31 MED ORDER — FENTANYL CITRATE (PF) 250 MCG/5ML IJ SOLN
INTRAMUSCULAR | Status: AC
  Filled 2023-08-31: qty 5

## 2023-08-31 MED ORDER — ONDANSETRON HCL 4 MG/2ML IJ SOLN
INTRAMUSCULAR | Status: DC | PRN
  Administered 2023-08-31: 4 mg via INTRAVENOUS

## 2023-08-31 MED ORDER — BUPIVACAINE-EPINEPHRINE 0.25% -1:200000 IJ SOLN
INTRAMUSCULAR | Status: DC | PRN
Start: 2023-08-31 — End: 2023-08-31
  Administered 2023-08-31: 14 mL

## 2023-08-31 MED ORDER — LACTATED RINGERS IV SOLN: INTRAVENOUS | Status: DC

## 2023-08-31 MED ORDER — CHLORHEXIDINE GLUCONATE 0.12 % MT SOLN
15.0000 mL | Freq: Once | OROMUCOSAL | Status: AC
  Administered 2023-08-31: 15 mL via OROMUCOSAL
  Filled 2023-08-31: qty 15

## 2023-08-31 MED ORDER — CHLORHEXIDINE GLUCONATE CLOTH 2 % EX PADS: 6.0000 | MEDICATED_PAD | Freq: Once | CUTANEOUS | Status: DC

## 2023-08-31 MED ORDER — HYDROMORPHONE HCL 1 MG/ML IJ SOLN
INTRAMUSCULAR | Status: AC
  Filled 2023-08-31: qty 1

## 2023-08-31 MED ORDER — MIDAZOLAM HCL 2 MG/2ML IJ SOLN
INTRAMUSCULAR | Status: AC
  Filled 2023-08-31: qty 2

## 2023-08-31 MED ORDER — SUGAMMADEX SODIUM 200 MG/2ML IV SOLN
INTRAVENOUS | Status: AC
  Filled 2023-08-31: qty 2

## 2023-08-31 MED ORDER — PROPOFOL 10 MG/ML IV BOLUS
INTRAVENOUS | Status: DC | PRN
  Administered 2023-08-31: 130 mg via INTRAVENOUS

## 2023-08-31 MED ORDER — CEFAZOLIN SODIUM-DEXTROSE 2-4 GM/100ML-% IV SOLN
2.0000 g | INTRAVENOUS | Status: AC
  Administered 2023-08-31: 2 g via INTRAVENOUS
  Filled 2023-08-31: qty 100

## 2023-08-31 MED ORDER — FENTANYL CITRATE (PF) 100 MCG/2ML IJ SOLN: 50.0000 ug | Freq: Once | INTRAMUSCULAR | Status: AC

## 2023-08-31 MED ORDER — EPHEDRINE SULFATE (PRESSORS) 50 MG/ML IJ SOLN
INTRAMUSCULAR | Status: DC | PRN
  Administered 2023-08-31 (×5): 10 mg via INTRAVENOUS

## 2023-08-31 MED ORDER — FENTANYL CITRATE (PF) 100 MCG/2ML IJ SOLN
INTRAMUSCULAR | Status: AC
  Administered 2023-08-31: 50 ug via INTRAVENOUS
  Filled 2023-08-31: qty 2

## 2023-08-31 MED ORDER — ROCURONIUM BROMIDE 100 MG/10ML IV SOLN
INTRAVENOUS | Status: DC | PRN
Start: 2023-08-31 — End: 2023-08-31
  Administered 2023-08-31: 50 mg via INTRAVENOUS
  Administered 2023-08-31: 30 mg via INTRAVENOUS

## 2023-08-31 MED ORDER — OXYCODONE HCL 5 MG PO TABS
5.0000 mg | ORAL_TABLET | Freq: Four times a day (QID) | ORAL | 0 refills | Status: AC | PRN
Start: 2023-08-31 — End: ?

## 2023-08-31 MED ORDER — 0.9 % SODIUM CHLORIDE (POUR BTL) OPTIME
TOPICAL | Status: DC | PRN
  Administered 2023-08-31: 1000 mL

## 2023-08-31 MED ORDER — ROCURONIUM BROMIDE 10 MG/ML (PF) SYRINGE
PREFILLED_SYRINGE | INTRAVENOUS | Status: AC
  Filled 2023-08-31: qty 10

## 2023-08-31 MED ORDER — LIDOCAINE 2% (20 MG/ML) 5 ML SYRINGE
INTRAMUSCULAR | Status: AC
  Filled 2023-08-31: qty 5

## 2023-08-31 MED ORDER — METHOCARBAMOL 750 MG PO TABS: 750.0000 mg | ORAL_TABLET | Freq: Three times a day (TID) | ORAL | 0 refills | Status: AC | PRN

## 2023-08-31 MED ORDER — ORAL CARE MOUTH RINSE: 15.0000 mL | Freq: Once | OROMUCOSAL | Status: AC

## 2023-08-31 MED ORDER — FENTANYL CITRATE (PF) 100 MCG/2ML IJ SOLN
INTRAMUSCULAR | Status: DC | PRN
  Administered 2023-08-31 (×3): 50 ug via INTRAVENOUS

## 2023-08-31 MED ORDER — DEXAMETHASONE SODIUM PHOSPHATE 10 MG/ML IJ SOLN
INTRAMUSCULAR | Status: DC | PRN
  Administered 2023-08-31: 8 mg via INTRAVENOUS

## 2023-08-31 MED ORDER — OXYCODONE HCL 5 MG/5ML PO SOLN: 5.0000 mg | Freq: Once | ORAL | Status: AC | PRN

## 2023-08-31 MED ORDER — LIDOCAINE 2% (20 MG/ML) 5 ML SYRINGE
INTRAMUSCULAR | Status: DC | PRN
Start: 2023-08-31 — End: 2023-08-31
  Administered 2023-08-31: 60 mg via INTRAVENOUS

## 2023-08-31 MED ORDER — HYDROMORPHONE HCL 1 MG/ML IJ SOLN
0.2500 mg | INTRAMUSCULAR | Status: DC | PRN
  Administered 2023-08-31 (×3): 0.25 mg via INTRAVENOUS
  Administered 2023-08-31: 0.5 mg via INTRAVENOUS
  Administered 2023-08-31: 0.25 mg via INTRAVENOUS

## 2023-08-31 MED ORDER — ONDANSETRON HCL 4 MG/2ML IJ SOLN: 4.0000 mg | Freq: Once | INTRAMUSCULAR | Status: DC | PRN

## 2023-08-31 MED ORDER — SUGAMMADEX SODIUM 200 MG/2ML IV SOLN
INTRAVENOUS | Status: DC | PRN
  Administered 2023-08-31: 400 mg via INTRAVENOUS

## 2023-08-31 MED ORDER — ONDANSETRON HCL 4 MG/2ML IJ SOLN
INTRAMUSCULAR | Status: AC
  Filled 2023-08-31: qty 2

## 2023-08-31 MED ORDER — PROPOFOL 10 MG/ML IV BOLUS
INTRAVENOUS | Status: AC
  Filled 2023-08-31: qty 20

## 2023-08-31 MED ORDER — OXYCODONE HCL 5 MG PO TABS
ORAL_TABLET | ORAL | Status: AC
  Filled 2023-08-31: qty 1

## 2023-08-31 MED ORDER — BUPIVACAINE-EPINEPHRINE (PF) 0.25% -1:200000 IJ SOLN
INTRAMUSCULAR | Status: AC
  Filled 2023-08-31: qty 60

## 2023-08-31 SURGICAL SUPPLY — 37 items
BLADE CLIPPER SURG (BLADE) IMPLANT
BLADE SURG 10 STRL SS (BLADE) IMPLANT
CANISTER SUCT 3000ML PPV (MISCELLANEOUS) IMPLANT
CHLORAPREP W/TINT 26 (MISCELLANEOUS) ×2 IMPLANT
COVER SURGICAL LIGHT HANDLE (MISCELLANEOUS) ×2 IMPLANT
DERMABOND ADVANCED .7 DNX12 (GAUZE/BANDAGES/DRESSINGS) IMPLANT
DRAIN PENROSE .5X12 LATEX STL (DRAIN) IMPLANT
DRAPE LAPAROTOMY TRNSV 102X78 (DRAPES) ×2 IMPLANT
DRAPE WARM FLUID 44X44 (DRAPES) ×2 IMPLANT
ELECT CAUTERY BLADE 6.4 (BLADE) IMPLANT
ELECT REM PT RETURN 9FT ADLT (ELECTROSURGICAL) ×2 IMPLANT
ELECTRODE REM PT RTRN 9FT ADLT (ELECTROSURGICAL) ×2 IMPLANT
GLOVE BIO SURGEON STRL SZ8 (GLOVE) ×2 IMPLANT
GLOVE BIOGEL PI IND STRL 8 (GLOVE) ×2 IMPLANT
GOWN STRL REUS W/ TWL LRG LVL3 (GOWN DISPOSABLE) ×4 IMPLANT
GOWN STRL REUS W/ TWL XL LVL3 (GOWN DISPOSABLE) ×2 IMPLANT
KIT BASIN OR (CUSTOM PROCEDURE TRAY) ×2 IMPLANT
KIT TURNOVER KIT B (KITS) ×4 IMPLANT
MESH OVITEX 1S PERM 10X12 6L (Mesh General) IMPLANT
NDL HYPO 25GX1X1/2 BEV (NEEDLE) ×2 IMPLANT
NEEDLE HYPO 25GX1X1/2 BEV (NEEDLE) ×2 IMPLANT
NS IRRIG 1000ML POUR BTL (IV SOLUTION) ×2 IMPLANT
PAD ARMBOARD 7.5X6 YLW CONV (MISCELLANEOUS) ×2 IMPLANT
PENCIL SMOKE EVACUATOR (MISCELLANEOUS) ×2 IMPLANT
SET TUBE SMOKE EVAC HIGH FLOW (TUBING) ×2 IMPLANT
SLEEVE Z-THREAD 5X100MM (TROCAR) ×2 IMPLANT
SUT MNCRL AB 4-0 PS2 18 (SUTURE) ×2 IMPLANT
SUT NOVA NAB DX-16 0-1 5-0 T12 (SUTURE) IMPLANT
SUT VIC AB 3-0 SH 18 (SUTURE) IMPLANT
SYR CONTROL 10ML LL (SYRINGE) ×2 IMPLANT
TOWEL GREEN STERILE (TOWEL DISPOSABLE) ×2 IMPLANT
TRAY LAPAROSCOPIC MC (CUSTOM PROCEDURE TRAY) ×2 IMPLANT
TROCAR BALLN 12MMX100 BLUNT (TROCAR) IMPLANT
TROCAR Z-THREAD OPTICAL 5X100M (TROCAR) ×2 IMPLANT
TUBE CONNECTING 12X1/4 (SUCTIONS) IMPLANT
WARMER LAPAROSCOPE (MISCELLANEOUS) ×2 IMPLANT
YANKAUER SUCT BULB TIP NO VENT (SUCTIONS) IMPLANT

## 2023-08-31 NOTE — Anesthesia Postprocedure Evaluation (Signed)
 Anesthesia Post Note  Patient: Anthony King  Procedure(s) Performed: LAPAROSCOPY DIAGNOSTIC (Abdomen) REMOVAL OF RIGHT GROIN HERNIA MESH (Right: Groin) RIGHT INGUINAL HERNIA REPAIR (Right: Groin) INSERTION OF MESH (Right: Groin)     Patient location during evaluation: PACU Anesthesia Type: General Level of consciousness: awake and alert and oriented Pain management: pain level controlled Vital Signs Assessment: post-procedure vital signs reviewed and stable Respiratory status: spontaneous breathing, nonlabored ventilation and respiratory function stable Cardiovascular status: blood pressure returned to baseline and stable Postop Assessment: no apparent nausea or vomiting Anesthetic complications: no   No notable events documented.  Last Vitals:  Vitals:   08/31/23 1615 08/31/23 1630  BP: 139/82 136/81  Pulse: 78 85  Resp: 14 17  Temp:    SpO2: 97% 98%    Last Pain:  Vitals:   08/31/23 1654  TempSrc:   PainSc: 6                  Mariena Meares A.

## 2023-08-31 NOTE — Interval H&P Note (Signed)
 History and Physical Interval Note:  08/31/2023 12:49 PM  Anthony King  has presented today for surgery, with the diagnosis of RIGHT GROIN PAIN.  The various methods of treatment have been discussed with the patient and family. After consideration of risks, benefits and other options for treatment, the patient has consented to  Procedure(s): LAPAROSCOPY DIAGNOSTIC (N/A) RIGHT GROIN EXPLORATION, POSSIBLE REMOVAL OF RIGHT GROIN MESH (Right) POSSIBLE RIGHT INGUINAL HERNIA REPAIR (Right) as a surgical intervention.  The patient's history has been reviewed, patient examined, no change in status, stable for surgery.  I have reviewed the patient's chart and labs.  Questions were answered to the patient's satisfaction.   Patient presents for laparoscopy possible mesh explantation of the right due to chronic pain, and repair of right inguinal hernia.  We discussed pros and cons approach.  He is a chronic pain for many years and has had no relief from other measures.  He may require also additional medication of the iliohypogastric nerve and general femoral branches depending on findings.  Risk of bleeding, infection, testicle loss, bowel injury, bladder injury, injury to major vascular structure, need for open surgery beyond open repair of his right inguinal hernia.  Reviewed all the findings.  Explained this may not resolve his pain.  He is willing to proceed since all the other modalities of pain control including previous ligation of his ilioinguinal nerve have not improved his pain.  Demetruis Depaul A Ellenie Salome

## 2023-08-31 NOTE — Transfer of Care (Signed)
 Immediate Anesthesia Transfer of Care Note  Patient: Anthony King  Procedure(s) Performed: LAPAROSCOPY DIAGNOSTIC (Abdomen) REMOVAL OF RIGHT GROIN HERNIA MESH (Right: Groin) RIGHT INGUINAL HERNIA REPAIR (Right: Groin) INSERTION OF MESH (Right: Groin)  Patient Location: PACU  Anesthesia Type:General  Level of Consciousness: drowsy and responds to stimulation  Airway & Oxygen Therapy: Patient Spontanous Breathing and Patient connected to face mask oxygen  Post-op Assessment: Report given to RN and Post -op Vital signs reviewed and stable  Post vital signs: Reviewed and stable  Last Vitals:  Vitals Value Taken Time  BP 135/75 08/31/23 1545  Temp    Pulse 88 08/31/23 1548  Resp 15 08/31/23 1548  SpO2 89 % 08/31/23 1548  Vitals shown include unfiled device data.  Last Pain:  Vitals:   08/31/23 1215  TempSrc:   PainSc: 0-No pain         Complications: No notable events documented.

## 2023-08-31 NOTE — Op Note (Signed)
 Preoperative diagnosis: Chronic right groin pain with previous history of bilateral inguinal hernia repair with mesh  Postoperative diagnosis: Recurrent right inguinal hernia without incarceration or strangulation reducible  Procedure: 1  Diagnostic laparoscopy #2 open repair of right inguinal hernia with explantation of old polypropylene mesh and placement of overtex 10 x 12 cm 1S permanent mesh  Surgeon: Harriette Bouillon, MD  Assistant: Berenda Morale, RNFA  Anesthesia: General With transversus abdominis plane block and 0.25% local with Marcaine with epinephrine  Drains: None  IV fluids: Per anesthesia record  Indications for procedure: The patient is a pleasant 73 year old male who back in 2015 underwent a laparoscopic bilateral inguinal hernia repair with mesh.  These repais failed over the next 5 years and he underwent bilateral open inguinal hernia pair with mesh.  His last procedure was 2020.  He developed chronic right groin pain in 2021.  Workup was negative and a laparoscopy was performed.  There is no anatomical issue at that point in time.  Of note both ilioinguinal nerves have been divided in the past.  He has been managed medically for the last 3 to 4 years but of late the pains got a lot worse.  Workup did not reveal any definite abnormality and I recommended laparoscopy to him due to his increasing pain.  I discussed the pros and cons of surgery in this circumstance versus medical management.  I discussed the potential need to explant his old mesh depending on findings.  I discussed laparoscopic and open approaches depending on findings but did feel given his chronic pain history that explantation of his old mesh would be an option for him.  After we discussed all the above he wished to have his old mesh removed and I told him that it be done open which she was fine with.  I did recommend diagnostic laparoscopy first to assess situation to make sure nothing from the standpoint of the  intra-abdominal component of his repair was the cause of his pain.  I did explain to him this may not relieve his pain.  Risks and benefits of surgery reviewed.The risk of hernia repair include bleeding,  Infection,   Recurrence of the hernia,  Mesh use, chronic pain,  Organ injury,  Bowel injury,  Bladder injury, testicle loss, testicle pain, nerve injury with numbness around the incision,  Death,  and worsening of preexisting  medical problems.  The alternatives to surgery have been discussed as well..  Long term expectations of both operative and non operative treatments have been discussed.   The patient agrees to proceed.    Description of procedure: The patient was met in the holding area questions were answered.  He underwent a transversus abdominis plane block per anesthesia protocol.  The right side was marked as the correct site.  He was then taken back to the operative room.  He was placed upon upon the operating table.  After induction of general anesthesia, the abdomen and both groins were prepped and draped in sterile fashion timeout performed.  The right side was marked as the correct site.  Timeout performed.  A laparoscopy was performed first.  Through a 1 cm supraumbilical incision, a 12 mm Hassan port was placed under direct vision.  Laparoscopy was performed.  Left side looks normal.  Right side showed recurrence of the right inguinal hernia.  I did not see any other intra-abdominal issues to explain his pain.  Since the patient wished to have his old mesh removed, we converted to  open.  The camera was removed.  The umbilical fascia closed and the skin closed with 4 Monocryl.     A right groin incision was made to the old scar.  Dissection was carried down through dense scar tissue into the aponeurosis of the external bleak was identified.  A small incision was made.  He had dense scar tissue along the right inguinal canal.  I was able to carefully and tediously mobilized the fascia off  the cord.  He had a defect which appeared to be a recurrent indirect hernia around the mesh.  I was able to carefully dissect out the cord.  I then found the medial border of the mesh.  I scored this with cautery and then out the cord.  I then found the medial border of the mesh.  I scored this with cautery and then dissected this away.  This was removed with the floor the inguinal canal which was a very very thin layer of transversus abdominis that was quite attenuated.  The majority the mesh was removed.  The more lateral portion was stuck down to where the iliac vessels were out opted to keep this in place.  The remainder the mesh that was removed.  Given his previous chronic pain, we used the overtex 10 x 12 cm 1S permanent mesh.  I placed this as a new floor the inguinal canal.  A slit was cut for the cord structures which were preserved.  We then secured the mesh down to the pubic tubercle.  This was secured laterally using the old mesh to buttress this along the shelving edge of the inguinal ligament.  It was secured medially to the internal oblique and tucked up under this.  In the slit was closed as well around the cord structures carefully but snugly.  #1 Novafil pop-off sutures were used to complete the repair.  I then opted to close the aponeurosis and fascia of the external oblique over top of this.  There is good hemostasis.  Of note there is no evidence of any residual ilioinguinal nerve that I could find despite dissecting into the muscles.  I did not find the iliohypogastric or general femoral nerve is either due to severe scar tissue.  We then approximated the subcutaneous tissues with 3-0 Vicryl.  4 Monocryl was used to close the skin in a subcuticular fashion.  Dermabond was applied.  All counts found to be correct.  The patient was awoke extubated taken to recovery in satisfactory condition.

## 2023-08-31 NOTE — Anesthesia Procedure Notes (Signed)
 Anesthesia Regional Block: TAP block   Pre-Anesthetic Checklist: , timeout performed,  Correct Patient, Correct Site, Correct Laterality,  Correct Procedure, Correct Position, site marked,  Risks and benefits discussed,  Surgical consent,  Pre-op evaluation,  At surgeon's request and post-op pain management  Laterality: Right  Prep: chloraprep       Needles:  Injection technique: Single-shot  Needle Type: Echogenic Stimulator Needle     Needle Length: 10cm  Needle Gauge: 21   Needle insertion depth: 8 cm   Additional Needles:   Procedures:,,,, ultrasound used (permanent image in chart),,    Narrative:  Start time: 08/31/2023 12:05 PM End time: 08/31/2023 12:10 PM Injection made incrementally with aspirations every 5 mL.  Performed by: Personally  Anesthesiologist: Mal Amabile, MD  Additional Notes: Timeout performed. Patient sedated. Relevant anatomy ID'd using [place]. Incremental 2-87ml injection of LA with frequent aspiration. Patient tolerated procedure well.

## 2023-08-31 NOTE — Discharge Instructions (Signed)

## 2023-09-01 ENCOUNTER — Encounter (HOSPITAL_COMMUNITY): Payer: Self-pay | Admitting: Surgery

## 2023-09-03 LAB — SURGICAL PATHOLOGY

## 2023-12-21 ENCOUNTER — Telehealth: Payer: Self-pay | Admitting: Family Medicine

## 2023-12-21 NOTE — Telephone Encounter (Signed)
 Copied from CRM [id number]. Topic: Medicare AWV >> Dec 21, 2023 11:47 AM Nathanel DEL wrote: Reason for CRM: LVM 12/21/2023 to schedule AWV. Please schedule Virtual or Telehealth visits ONLY.   Nathanel Paschal; Care Guide Ambulatory Clinical Support [place] [facility] Direct Dial: [phone]

## 2023-12-21 NOTE — Telephone Encounter (Signed)
 Copied from CRM [id number]. Topic: Medicare AWV >> Dec 21, 2023 11:44 AM Anthony DEL wrote: Reason for CRM: LVM 12/21/2023 to schedule AWV. Please schedule Virtual or Telehealth visits ONLY.   Anthony King; Care Guide Ambulatory Clinical Support [place] [facility] Direct Dial: [phone]

## 2024-03-22 ENCOUNTER — Ambulatory Visit: Payer: Self-pay

## 2024-03-22 NOTE — Telephone Encounter (Signed)
 FYI Only or Action Required?: Action required by provider: request for appointment and clinical question for provider.  Patient was last seen in primary care on 06/25/2023 by Antonio Meth, Jamee SAUNDERS, DO.  Called Nurse Triage reporting Abdominal Pain.  Symptoms began several days ago.  Interventions attempted: Nothing.  Symptoms are: gradually worsening.  Triage Disposition: See HCP Within 4 Hours (Or PCP Triage)  Patient/caregiver understands and will follow disposition?: No, wishes to speak with PCP   Copied from CRM #[id number]. Topic: Clinical - Red Word Triage >> Mar 22, 2024  9:22 AM Franky GRADE wrote: Red Word that prompted transfer to Nurse Triage: Patient is experiencing abdomen pain for about a month now. Patient previously had 3 hernia surgery. Reason for Disposition  [1] MILD-MODERATE pain AND [2] constant AND [3] present > 2 hours  Answer Assessment - Initial Assessment Questions No available appts today. Pt adamantly requested for appt with Dr. Antonio. Advised UC/ED today. Pt declined and reports I'll make arrangements, then disconnected call.  1. LOCATION: Where does it hurt?      Right lower abd 2. RADIATION: Does the pain shoot anywhere else? (e.g., chest, back)     no 3. ONSET: When did the pain begin? (Minutes, hours or days ago)      Month ago 4. SUDDEN: Gradual or sudden onset?     gradual 5. PATTERN Does the pain come and go, or is it constant?     Constant in this past week, last month comes and goes 6. SEVERITY: How bad is the pain?  (e.g., Scale 1-10; mild, moderate, or severe)     6/10 7. RECURRENT SYMPTOM: Have you ever had this type of stomach pain before? If Yes, ask: When was the last time? and What happened that time?      New pain 8. CAUSE: What do you think is causing the stomach pain? (e.g., gallstones, recent abdominal surgery)     Hernia 3x surgery; 2 years ago 9. RELIEVING/AGGRAVATING FACTORS: What makes it better or worse?  (e.g., antacids, bending or twisting motion, bowel movement)     Bm makes better,  10. OTHER SYMPTOMS: Do you have any other symptoms? (e.g., back pain, diarrhea, fever, urination pain, vomiting)       Denies back pain, bleeding, blood stool, vomited, fever, chills, faint, dizziness, HA Nausea Last AF:unijb Feel no pressure when urinating  Protocols used: Abdominal Pain - Male-A-AH

## 2024-03-22 NOTE — Telephone Encounter (Signed)
 Called [facility], spoke Jannis, MD is booked and MD not in today.

## 2024-03-23 NOTE — Telephone Encounter (Signed)
 Called pt and was told he did not want this appt. Says he made an appt. With different doctor

## 2024-04-07 ENCOUNTER — Other Ambulatory Visit: Payer: Self-pay | Admitting: Surgery

## 2024-04-07 DIAGNOSIS — R1031 Right lower quadrant pain: Secondary | ICD-10-CM

## 2024-04-17 ENCOUNTER — Ambulatory Visit
Admission: RE | Admit: 2024-04-17 | Discharge: 2024-04-17 | Disposition: A | Discharge status: Home / Self Care | Source: Ambulatory Visit | Attending: Surgery | Admitting: Surgery

## 2024-04-17 DIAGNOSIS — R1031 Right lower quadrant pain: Secondary | ICD-10-CM

## 2024-04-17 DIAGNOSIS — K4090 Unilateral inguinal hernia, without obstruction or gangrene, not specified as recurrent: Secondary | ICD-10-CM | POA: Diagnosis not present

## 2024-04-17 MED ORDER — IOPAMIDOL (ISOVUE-300) INJECTION 61%
100.0000 mL | Freq: Once | INTRAVENOUS | Status: AC | PRN
  Administered 2024-04-17: 100 mL via INTRAVENOUS

## 2024-04-25 ENCOUNTER — Ambulatory Visit: Payer: Self-pay | Admitting: Surgery

## 2024-05-08 DIAGNOSIS — R1031 Right lower quadrant pain: Secondary | ICD-10-CM | POA: Diagnosis not present

## 2024-06-12 DIAGNOSIS — G5791 Unspecified mononeuropathy of right lower limb: Secondary | ICD-10-CM | POA: Diagnosis not present

## 2024-06-14 DIAGNOSIS — G5791 Unspecified mononeuropathy of right lower limb: Secondary | ICD-10-CM | POA: Diagnosis not present
